# Patient Record
Sex: Female | Born: 1940 | Race: White | Hispanic: No | Smoking: Former smoker
Health system: Southern US, Community
[De-identification: ages and names within clinical notes are randomized; demographics above are authoritative.]

## PROBLEM LIST (undated history)

## (undated) DIAGNOSIS — I10 Essential (primary) hypertension: Secondary | ICD-10-CM

## (undated) DIAGNOSIS — G4700 Insomnia, unspecified: Secondary | ICD-10-CM

## (undated) DIAGNOSIS — C801 Malignant (primary) neoplasm, unspecified: Secondary | ICD-10-CM

## (undated) DIAGNOSIS — E785 Hyperlipidemia, unspecified: Secondary | ICD-10-CM

## (undated) DIAGNOSIS — E119 Type 2 diabetes mellitus without complications: Secondary | ICD-10-CM

## (undated) DIAGNOSIS — Z8542 Personal history of malignant neoplasm of other parts of uterus: Secondary | ICD-10-CM

## (undated) DIAGNOSIS — Z803 Family history of malignant neoplasm of breast: Secondary | ICD-10-CM

## (undated) HISTORY — PX: NASAL SEPTOPLASTY W/ TURBINOPLASTY: SHX2070

## (undated) HISTORY — DX: Family history of malignant neoplasm of breast: Z80.3

## (undated) HISTORY — PX: CARPAL TUNNEL RELEASE: SHX101

## (undated) HISTORY — PX: ABDOMINAL HYSTERECTOMY: SHX81

## (undated) HISTORY — PX: TONSILLECTOMY: SUR1361

## (undated) HISTORY — PX: OTHER SURGICAL HISTORY: SHX169

## (undated) HISTORY — DX: Malignant (primary) neoplasm, unspecified: C80.1

## (undated) HISTORY — DX: Hyperlipidemia, unspecified: E78.5

## (undated) HISTORY — PX: TOTAL ABDOMINAL HYSTERECTOMY W/ BILATERAL SALPINGOOPHORECTOMY: SHX83

## (undated) HISTORY — DX: Personal history of malignant neoplasm of other parts of uterus: Z85.42

## (undated) HISTORY — DX: Insomnia, unspecified: G47.00

---

## 1979-02-28 DIAGNOSIS — C801 Malignant (primary) neoplasm, unspecified: Secondary | ICD-10-CM

## 1979-02-28 HISTORY — DX: Malignant (primary) neoplasm, unspecified: C80.1

## 1999-01-24 ENCOUNTER — Other Ambulatory Visit: Admission: RE | Admit: 1999-01-24 | Discharge: 1999-01-24 | Payer: Self-pay | Admitting: Gynecology

## 2000-01-23 ENCOUNTER — Other Ambulatory Visit: Admission: RE | Admit: 2000-01-23 | Discharge: 2000-01-23 | Payer: Self-pay | Admitting: Gynecology

## 2000-06-13 ENCOUNTER — Encounter: Admission: RE | Admit: 2000-06-13 | Discharge: 2000-06-13 | Payer: Self-pay | Admitting: Gastroenterology

## 2000-06-13 ENCOUNTER — Encounter: Payer: Self-pay | Admitting: Gastroenterology

## 2000-07-09 ENCOUNTER — Ambulatory Visit (HOSPITAL_COMMUNITY): Admission: RE | Admit: 2000-07-09 | Discharge: 2000-07-09 | Payer: Self-pay | Admitting: Gastroenterology

## 2001-01-14 ENCOUNTER — Other Ambulatory Visit: Admission: RE | Admit: 2001-01-14 | Discharge: 2001-01-14 | Payer: Self-pay | Admitting: Gynecology

## 2001-05-20 ENCOUNTER — Encounter (INDEPENDENT_AMBULATORY_CARE_PROVIDER_SITE_OTHER): Payer: Self-pay | Admitting: Specialist

## 2001-05-20 ENCOUNTER — Ambulatory Visit (HOSPITAL_COMMUNITY): Admission: RE | Admit: 2001-05-20 | Discharge: 2001-05-20 | Payer: Self-pay | Admitting: Gastroenterology

## 2002-01-15 ENCOUNTER — Other Ambulatory Visit: Admission: RE | Admit: 2002-01-15 | Discharge: 2002-01-15 | Payer: Self-pay | Admitting: Gynecology

## 2003-01-19 ENCOUNTER — Other Ambulatory Visit: Admission: RE | Admit: 2003-01-19 | Discharge: 2003-01-19 | Payer: Self-pay | Admitting: Gynecology

## 2003-05-27 ENCOUNTER — Encounter: Admission: RE | Admit: 2003-05-27 | Discharge: 2003-05-27 | Payer: Self-pay | Admitting: Family Medicine

## 2003-06-03 ENCOUNTER — Encounter: Admission: RE | Admit: 2003-06-03 | Discharge: 2003-06-03 | Payer: Self-pay | Admitting: Family Medicine

## 2004-01-25 ENCOUNTER — Other Ambulatory Visit: Admission: RE | Admit: 2004-01-25 | Discharge: 2004-01-25 | Payer: Self-pay | Admitting: Gynecology

## 2004-03-03 ENCOUNTER — Ambulatory Visit: Payer: Self-pay | Admitting: Internal Medicine

## 2004-03-08 ENCOUNTER — Ambulatory Visit: Payer: Self-pay | Admitting: Internal Medicine

## 2004-03-15 ENCOUNTER — Ambulatory Visit: Payer: Self-pay | Admitting: Internal Medicine

## 2004-03-25 ENCOUNTER — Ambulatory Visit: Payer: Self-pay | Admitting: Internal Medicine

## 2004-04-01 ENCOUNTER — Ambulatory Visit: Payer: Self-pay | Admitting: Internal Medicine

## 2004-04-08 ENCOUNTER — Ambulatory Visit: Payer: Self-pay | Admitting: Internal Medicine

## 2004-04-15 ENCOUNTER — Ambulatory Visit: Payer: Self-pay | Admitting: Internal Medicine

## 2004-04-21 ENCOUNTER — Ambulatory Visit: Payer: Self-pay | Admitting: Internal Medicine

## 2004-04-29 ENCOUNTER — Ambulatory Visit: Payer: Self-pay | Admitting: Internal Medicine

## 2004-05-05 ENCOUNTER — Ambulatory Visit: Payer: Self-pay | Admitting: Internal Medicine

## 2004-05-13 ENCOUNTER — Ambulatory Visit: Payer: Self-pay | Admitting: Internal Medicine

## 2004-05-25 ENCOUNTER — Ambulatory Visit: Payer: Self-pay | Admitting: Internal Medicine

## 2004-05-30 ENCOUNTER — Ambulatory Visit: Payer: Self-pay | Admitting: Internal Medicine

## 2004-06-07 ENCOUNTER — Ambulatory Visit: Payer: Self-pay | Admitting: Internal Medicine

## 2004-06-14 ENCOUNTER — Ambulatory Visit: Payer: Self-pay | Admitting: Internal Medicine

## 2004-06-21 ENCOUNTER — Ambulatory Visit: Payer: Self-pay | Admitting: Internal Medicine

## 2004-07-05 ENCOUNTER — Ambulatory Visit: Payer: Self-pay | Admitting: Internal Medicine

## 2004-07-15 ENCOUNTER — Ambulatory Visit: Payer: Self-pay | Admitting: Internal Medicine

## 2004-07-19 ENCOUNTER — Ambulatory Visit: Payer: Self-pay | Admitting: Internal Medicine

## 2004-07-26 ENCOUNTER — Ambulatory Visit: Payer: Self-pay | Admitting: Internal Medicine

## 2004-08-02 ENCOUNTER — Ambulatory Visit: Payer: Self-pay | Admitting: Internal Medicine

## 2004-08-08 ENCOUNTER — Ambulatory Visit: Payer: Self-pay | Admitting: Internal Medicine

## 2004-08-18 ENCOUNTER — Ambulatory Visit: Payer: Self-pay | Admitting: Internal Medicine

## 2004-08-29 ENCOUNTER — Ambulatory Visit: Payer: Self-pay | Admitting: Internal Medicine

## 2004-09-02 ENCOUNTER — Encounter: Admission: RE | Admit: 2004-09-02 | Discharge: 2004-09-02 | Payer: Self-pay | Admitting: Emergency Medicine

## 2004-09-12 ENCOUNTER — Ambulatory Visit: Payer: Self-pay | Admitting: Internal Medicine

## 2004-09-22 ENCOUNTER — Ambulatory Visit: Payer: Self-pay | Admitting: Internal Medicine

## 2004-09-27 ENCOUNTER — Ambulatory Visit: Payer: Self-pay | Admitting: Internal Medicine

## 2004-10-11 ENCOUNTER — Ambulatory Visit: Payer: Self-pay | Admitting: Internal Medicine

## 2004-10-19 ENCOUNTER — Ambulatory Visit: Payer: Self-pay | Admitting: Internal Medicine

## 2004-10-26 ENCOUNTER — Ambulatory Visit: Payer: Self-pay | Admitting: Internal Medicine

## 2004-11-02 ENCOUNTER — Ambulatory Visit: Payer: Self-pay | Admitting: Internal Medicine

## 2004-11-08 ENCOUNTER — Ambulatory Visit: Payer: Self-pay | Admitting: Internal Medicine

## 2004-11-16 ENCOUNTER — Ambulatory Visit: Payer: Self-pay | Admitting: Internal Medicine

## 2004-11-25 ENCOUNTER — Ambulatory Visit: Payer: Self-pay | Admitting: Internal Medicine

## 2004-12-01 ENCOUNTER — Ambulatory Visit: Payer: Self-pay | Admitting: Internal Medicine

## 2004-12-02 ENCOUNTER — Ambulatory Visit: Payer: Self-pay | Admitting: Internal Medicine

## 2004-12-07 ENCOUNTER — Ambulatory Visit: Payer: Self-pay | Admitting: Internal Medicine

## 2004-12-15 ENCOUNTER — Ambulatory Visit: Payer: Self-pay | Admitting: Internal Medicine

## 2004-12-21 ENCOUNTER — Ambulatory Visit: Payer: Self-pay | Admitting: Internal Medicine

## 2004-12-27 ENCOUNTER — Ambulatory Visit: Payer: Self-pay | Admitting: Internal Medicine

## 2005-01-05 ENCOUNTER — Ambulatory Visit: Payer: Self-pay | Admitting: Internal Medicine

## 2005-01-13 ENCOUNTER — Ambulatory Visit: Payer: Self-pay | Admitting: Internal Medicine

## 2005-01-18 ENCOUNTER — Ambulatory Visit: Payer: Self-pay | Admitting: Internal Medicine

## 2005-01-23 ENCOUNTER — Ambulatory Visit: Payer: Self-pay | Admitting: Internal Medicine

## 2005-01-24 ENCOUNTER — Other Ambulatory Visit: Admission: RE | Admit: 2005-01-24 | Discharge: 2005-01-24 | Payer: Self-pay | Admitting: Gynecology

## 2005-02-01 ENCOUNTER — Ambulatory Visit: Payer: Self-pay | Admitting: Internal Medicine

## 2005-02-06 ENCOUNTER — Ambulatory Visit: Payer: Self-pay | Admitting: Internal Medicine

## 2005-02-15 ENCOUNTER — Ambulatory Visit: Payer: Self-pay | Admitting: Internal Medicine

## 2005-02-23 ENCOUNTER — Ambulatory Visit: Payer: Self-pay | Admitting: Internal Medicine

## 2005-03-01 ENCOUNTER — Ambulatory Visit: Payer: Self-pay | Admitting: Internal Medicine

## 2005-03-09 ENCOUNTER — Ambulatory Visit: Payer: Self-pay | Admitting: Internal Medicine

## 2005-03-15 ENCOUNTER — Ambulatory Visit: Payer: Self-pay | Admitting: Internal Medicine

## 2005-03-22 ENCOUNTER — Ambulatory Visit: Payer: Self-pay | Admitting: Internal Medicine

## 2005-03-23 ENCOUNTER — Ambulatory Visit: Payer: Self-pay | Admitting: Internal Medicine

## 2005-03-29 ENCOUNTER — Ambulatory Visit: Payer: Self-pay | Admitting: Internal Medicine

## 2005-04-05 ENCOUNTER — Ambulatory Visit: Payer: Self-pay | Admitting: Internal Medicine

## 2005-04-12 ENCOUNTER — Ambulatory Visit: Payer: Self-pay | Admitting: Internal Medicine

## 2005-04-18 ENCOUNTER — Ambulatory Visit: Payer: Self-pay | Admitting: Internal Medicine

## 2005-04-26 ENCOUNTER — Ambulatory Visit: Payer: Self-pay | Admitting: Internal Medicine

## 2005-05-02 ENCOUNTER — Ambulatory Visit: Payer: Self-pay | Admitting: Internal Medicine

## 2005-05-11 ENCOUNTER — Ambulatory Visit: Payer: Self-pay | Admitting: Internal Medicine

## 2005-05-16 ENCOUNTER — Ambulatory Visit: Payer: Self-pay | Admitting: Internal Medicine

## 2005-05-25 ENCOUNTER — Ambulatory Visit: Payer: Self-pay | Admitting: Internal Medicine

## 2005-05-30 ENCOUNTER — Ambulatory Visit: Payer: Self-pay | Admitting: Internal Medicine

## 2005-06-09 ENCOUNTER — Ambulatory Visit: Payer: Self-pay | Admitting: Internal Medicine

## 2005-06-13 ENCOUNTER — Ambulatory Visit: Payer: Self-pay | Admitting: Internal Medicine

## 2005-06-21 ENCOUNTER — Ambulatory Visit: Payer: Self-pay | Admitting: Internal Medicine

## 2005-06-27 ENCOUNTER — Ambulatory Visit: Payer: Self-pay | Admitting: Internal Medicine

## 2005-07-04 ENCOUNTER — Ambulatory Visit: Payer: Self-pay | Admitting: Internal Medicine

## 2005-07-05 ENCOUNTER — Ambulatory Visit: Payer: Self-pay | Admitting: Internal Medicine

## 2005-07-14 ENCOUNTER — Ambulatory Visit: Payer: Self-pay | Admitting: Internal Medicine

## 2005-07-19 ENCOUNTER — Ambulatory Visit: Payer: Self-pay | Admitting: Internal Medicine

## 2005-07-26 ENCOUNTER — Ambulatory Visit: Payer: Self-pay | Admitting: Internal Medicine

## 2005-08-02 ENCOUNTER — Ambulatory Visit: Payer: Self-pay | Admitting: Internal Medicine

## 2005-08-08 ENCOUNTER — Ambulatory Visit: Payer: Self-pay | Admitting: Internal Medicine

## 2005-08-16 ENCOUNTER — Ambulatory Visit: Payer: Self-pay | Admitting: Internal Medicine

## 2005-08-23 ENCOUNTER — Ambulatory Visit: Payer: Self-pay | Admitting: Internal Medicine

## 2005-08-29 ENCOUNTER — Ambulatory Visit: Payer: Self-pay | Admitting: Internal Medicine

## 2005-09-07 ENCOUNTER — Ambulatory Visit: Payer: Self-pay | Admitting: Internal Medicine

## 2005-09-15 ENCOUNTER — Ambulatory Visit: Payer: Self-pay | Admitting: Internal Medicine

## 2005-09-20 ENCOUNTER — Ambulatory Visit: Payer: Self-pay | Admitting: Internal Medicine

## 2005-09-27 ENCOUNTER — Ambulatory Visit: Payer: Self-pay | Admitting: Internal Medicine

## 2005-10-03 ENCOUNTER — Ambulatory Visit: Payer: Self-pay | Admitting: Internal Medicine

## 2005-10-09 ENCOUNTER — Ambulatory Visit: Payer: Self-pay | Admitting: Internal Medicine

## 2005-10-10 ENCOUNTER — Ambulatory Visit (HOSPITAL_BASED_OUTPATIENT_CLINIC_OR_DEPARTMENT_OTHER): Admission: RE | Admit: 2005-10-10 | Discharge: 2005-10-10 | Payer: Self-pay | Admitting: Orthopedic Surgery

## 2005-10-17 ENCOUNTER — Ambulatory Visit: Payer: Self-pay | Admitting: Internal Medicine

## 2005-10-25 ENCOUNTER — Ambulatory Visit: Payer: Self-pay | Admitting: Internal Medicine

## 2005-10-26 ENCOUNTER — Ambulatory Visit: Payer: Self-pay | Admitting: Internal Medicine

## 2005-10-31 ENCOUNTER — Ambulatory Visit: Payer: Self-pay | Admitting: Internal Medicine

## 2005-11-08 ENCOUNTER — Ambulatory Visit: Payer: Self-pay | Admitting: Internal Medicine

## 2005-11-20 ENCOUNTER — Ambulatory Visit: Payer: Self-pay | Admitting: Internal Medicine

## 2005-11-27 ENCOUNTER — Ambulatory Visit: Payer: Self-pay | Admitting: Internal Medicine

## 2005-12-07 ENCOUNTER — Ambulatory Visit: Payer: Self-pay | Admitting: Internal Medicine

## 2005-12-15 ENCOUNTER — Ambulatory Visit: Payer: Self-pay | Admitting: Internal Medicine

## 2005-12-20 ENCOUNTER — Ambulatory Visit: Payer: Self-pay | Admitting: Internal Medicine

## 2005-12-27 ENCOUNTER — Ambulatory Visit: Payer: Self-pay | Admitting: Internal Medicine

## 2006-01-03 ENCOUNTER — Ambulatory Visit: Payer: Self-pay | Admitting: Internal Medicine

## 2006-01-09 ENCOUNTER — Ambulatory Visit: Payer: Self-pay | Admitting: Internal Medicine

## 2006-01-16 ENCOUNTER — Ambulatory Visit: Payer: Self-pay | Admitting: Internal Medicine

## 2006-01-25 ENCOUNTER — Ambulatory Visit: Payer: Self-pay | Admitting: Internal Medicine

## 2006-01-29 ENCOUNTER — Other Ambulatory Visit: Admission: RE | Admit: 2006-01-29 | Discharge: 2006-01-29 | Payer: Self-pay | Admitting: Gynecology

## 2006-01-31 ENCOUNTER — Ambulatory Visit: Payer: Self-pay | Admitting: Internal Medicine

## 2006-02-06 ENCOUNTER — Ambulatory Visit: Payer: Self-pay | Admitting: Internal Medicine

## 2006-02-13 ENCOUNTER — Ambulatory Visit: Payer: Self-pay | Admitting: Internal Medicine

## 2006-02-22 ENCOUNTER — Ambulatory Visit: Payer: Self-pay | Admitting: Internal Medicine

## 2006-03-02 ENCOUNTER — Ambulatory Visit: Payer: Self-pay | Admitting: Internal Medicine

## 2006-03-06 ENCOUNTER — Ambulatory Visit: Payer: Self-pay | Admitting: Internal Medicine

## 2006-03-14 ENCOUNTER — Ambulatory Visit: Payer: Self-pay | Admitting: Internal Medicine

## 2006-03-22 ENCOUNTER — Ambulatory Visit: Payer: Self-pay | Admitting: Internal Medicine

## 2006-03-29 ENCOUNTER — Ambulatory Visit: Payer: Self-pay | Admitting: Internal Medicine

## 2006-04-04 ENCOUNTER — Ambulatory Visit: Payer: Self-pay | Admitting: Internal Medicine

## 2006-04-10 ENCOUNTER — Ambulatory Visit: Payer: Self-pay | Admitting: Internal Medicine

## 2006-04-19 ENCOUNTER — Ambulatory Visit: Payer: Self-pay | Admitting: Internal Medicine

## 2006-04-24 ENCOUNTER — Ambulatory Visit: Payer: Self-pay | Admitting: Internal Medicine

## 2006-05-01 ENCOUNTER — Ambulatory Visit: Payer: Self-pay | Admitting: Internal Medicine

## 2006-05-09 ENCOUNTER — Ambulatory Visit: Payer: Self-pay | Admitting: Internal Medicine

## 2006-05-15 ENCOUNTER — Ambulatory Visit: Payer: Self-pay | Admitting: Internal Medicine

## 2006-05-24 ENCOUNTER — Ambulatory Visit: Payer: Self-pay | Admitting: Internal Medicine

## 2006-05-31 ENCOUNTER — Ambulatory Visit: Payer: Self-pay | Admitting: Internal Medicine

## 2006-06-04 ENCOUNTER — Ambulatory Visit: Payer: Self-pay | Admitting: Internal Medicine

## 2006-06-13 ENCOUNTER — Ambulatory Visit: Payer: Self-pay | Admitting: Internal Medicine

## 2006-06-25 ENCOUNTER — Ambulatory Visit: Payer: Self-pay | Admitting: Internal Medicine

## 2006-07-03 ENCOUNTER — Ambulatory Visit: Payer: Self-pay | Admitting: Internal Medicine

## 2006-07-12 ENCOUNTER — Ambulatory Visit: Payer: Self-pay | Admitting: Internal Medicine

## 2006-07-19 ENCOUNTER — Ambulatory Visit: Payer: Self-pay | Admitting: Internal Medicine

## 2006-08-02 ENCOUNTER — Ambulatory Visit: Payer: Self-pay | Admitting: Internal Medicine

## 2006-08-08 ENCOUNTER — Ambulatory Visit: Payer: Self-pay | Admitting: Internal Medicine

## 2006-08-14 ENCOUNTER — Ambulatory Visit: Payer: Self-pay | Admitting: Internal Medicine

## 2006-08-23 ENCOUNTER — Ambulatory Visit: Payer: Self-pay | Admitting: Internal Medicine

## 2006-08-28 ENCOUNTER — Ambulatory Visit: Payer: Self-pay | Admitting: Internal Medicine

## 2006-09-03 ENCOUNTER — Ambulatory Visit: Payer: Self-pay | Admitting: Internal Medicine

## 2006-09-11 ENCOUNTER — Ambulatory Visit: Payer: Self-pay | Admitting: Internal Medicine

## 2006-09-21 ENCOUNTER — Ambulatory Visit: Payer: Self-pay | Admitting: Internal Medicine

## 2006-09-26 ENCOUNTER — Ambulatory Visit: Payer: Self-pay | Admitting: Internal Medicine

## 2006-09-28 ENCOUNTER — Ambulatory Visit: Payer: Self-pay | Admitting: Internal Medicine

## 2006-10-05 ENCOUNTER — Ambulatory Visit: Payer: Self-pay | Admitting: Internal Medicine

## 2006-10-10 ENCOUNTER — Ambulatory Visit: Payer: Self-pay | Admitting: Internal Medicine

## 2006-10-22 ENCOUNTER — Ambulatory Visit: Payer: Self-pay | Admitting: Internal Medicine

## 2006-10-30 ENCOUNTER — Ambulatory Visit: Payer: Self-pay | Admitting: Internal Medicine

## 2006-11-06 ENCOUNTER — Ambulatory Visit: Payer: Self-pay | Admitting: Internal Medicine

## 2006-11-15 ENCOUNTER — Ambulatory Visit: Payer: Self-pay | Admitting: Internal Medicine

## 2006-11-22 ENCOUNTER — Ambulatory Visit: Payer: Self-pay | Admitting: Internal Medicine

## 2006-11-29 ENCOUNTER — Ambulatory Visit: Payer: Self-pay | Admitting: Internal Medicine

## 2006-12-05 ENCOUNTER — Ambulatory Visit: Payer: Self-pay | Admitting: Internal Medicine

## 2006-12-14 ENCOUNTER — Ambulatory Visit: Payer: Self-pay | Admitting: Internal Medicine

## 2006-12-19 ENCOUNTER — Ambulatory Visit: Payer: Self-pay | Admitting: Internal Medicine

## 2006-12-27 ENCOUNTER — Ambulatory Visit: Payer: Self-pay | Admitting: Internal Medicine

## 2007-01-02 ENCOUNTER — Ambulatory Visit: Payer: Self-pay | Admitting: Internal Medicine

## 2007-01-09 ENCOUNTER — Ambulatory Visit: Payer: Self-pay | Admitting: Internal Medicine

## 2007-01-16 ENCOUNTER — Ambulatory Visit: Payer: Self-pay | Admitting: Internal Medicine

## 2007-01-22 ENCOUNTER — Ambulatory Visit: Payer: Self-pay | Admitting: Internal Medicine

## 2007-01-23 ENCOUNTER — Ambulatory Visit: Payer: Self-pay | Admitting: Internal Medicine

## 2007-01-31 ENCOUNTER — Ambulatory Visit: Payer: Self-pay | Admitting: Internal Medicine

## 2007-02-05 ENCOUNTER — Other Ambulatory Visit: Admission: RE | Admit: 2007-02-05 | Discharge: 2007-02-05 | Payer: Self-pay | Admitting: Gynecology

## 2007-02-08 ENCOUNTER — Ambulatory Visit: Payer: Self-pay | Admitting: Internal Medicine

## 2007-02-14 ENCOUNTER — Ambulatory Visit: Payer: Self-pay | Admitting: Internal Medicine

## 2007-02-25 ENCOUNTER — Ambulatory Visit: Payer: Self-pay | Admitting: Internal Medicine

## 2007-03-05 ENCOUNTER — Ambulatory Visit: Payer: Self-pay | Admitting: Internal Medicine

## 2007-03-13 ENCOUNTER — Ambulatory Visit: Payer: Self-pay | Admitting: Internal Medicine

## 2007-03-13 DIAGNOSIS — J45998 Other asthma: Secondary | ICD-10-CM

## 2007-03-16 DIAGNOSIS — J309 Allergic rhinitis, unspecified: Secondary | ICD-10-CM | POA: Insufficient documentation

## 2007-03-16 DIAGNOSIS — E785 Hyperlipidemia, unspecified: Secondary | ICD-10-CM | POA: Insufficient documentation

## 2007-03-22 ENCOUNTER — Ambulatory Visit: Payer: Self-pay | Admitting: Internal Medicine

## 2007-03-27 ENCOUNTER — Ambulatory Visit: Payer: Self-pay | Admitting: Internal Medicine

## 2007-04-10 ENCOUNTER — Ambulatory Visit: Payer: Self-pay | Admitting: Internal Medicine

## 2007-04-17 ENCOUNTER — Ambulatory Visit: Payer: Self-pay | Admitting: Internal Medicine

## 2007-04-24 ENCOUNTER — Ambulatory Visit: Payer: Self-pay | Admitting: Internal Medicine

## 2007-04-30 ENCOUNTER — Ambulatory Visit: Payer: Self-pay | Admitting: Internal Medicine

## 2007-05-10 ENCOUNTER — Ambulatory Visit: Payer: Self-pay | Admitting: Internal Medicine

## 2007-05-16 ENCOUNTER — Ambulatory Visit: Payer: Self-pay | Admitting: Internal Medicine

## 2007-05-17 ENCOUNTER — Ambulatory Visit: Payer: Self-pay | Admitting: Internal Medicine

## 2007-05-23 ENCOUNTER — Ambulatory Visit: Payer: Self-pay | Admitting: Internal Medicine

## 2007-05-31 ENCOUNTER — Ambulatory Visit: Payer: Self-pay | Admitting: Internal Medicine

## 2007-06-04 ENCOUNTER — Ambulatory Visit: Payer: Self-pay | Admitting: Internal Medicine

## 2007-06-12 ENCOUNTER — Ambulatory Visit: Payer: Self-pay | Admitting: Internal Medicine

## 2007-06-20 ENCOUNTER — Ambulatory Visit: Payer: Self-pay | Admitting: Internal Medicine

## 2007-07-03 ENCOUNTER — Ambulatory Visit: Payer: Self-pay | Admitting: Internal Medicine

## 2007-07-12 ENCOUNTER — Ambulatory Visit: Payer: Self-pay | Admitting: Internal Medicine

## 2007-07-18 ENCOUNTER — Ambulatory Visit: Payer: Self-pay | Admitting: Internal Medicine

## 2007-07-23 ENCOUNTER — Ambulatory Visit: Payer: Self-pay | Admitting: Internal Medicine

## 2007-08-02 ENCOUNTER — Ambulatory Visit: Payer: Self-pay | Admitting: Internal Medicine

## 2007-08-08 ENCOUNTER — Ambulatory Visit: Payer: Self-pay | Admitting: Internal Medicine

## 2007-08-13 ENCOUNTER — Ambulatory Visit: Payer: Self-pay | Admitting: Internal Medicine

## 2007-08-22 ENCOUNTER — Ambulatory Visit: Payer: Self-pay | Admitting: Internal Medicine

## 2007-08-28 ENCOUNTER — Ambulatory Visit: Payer: Self-pay | Admitting: Internal Medicine

## 2007-09-03 ENCOUNTER — Ambulatory Visit: Payer: Self-pay | Admitting: Internal Medicine

## 2007-09-10 ENCOUNTER — Ambulatory Visit: Payer: Self-pay | Admitting: Internal Medicine

## 2007-09-11 ENCOUNTER — Encounter: Payer: Self-pay | Admitting: Internal Medicine

## 2007-09-24 ENCOUNTER — Ambulatory Visit: Payer: Self-pay | Admitting: Internal Medicine

## 2007-09-25 ENCOUNTER — Ambulatory Visit: Payer: Self-pay | Admitting: Internal Medicine

## 2007-10-01 ENCOUNTER — Ambulatory Visit: Payer: Self-pay | Admitting: Internal Medicine

## 2007-10-10 ENCOUNTER — Ambulatory Visit: Payer: Self-pay | Admitting: Internal Medicine

## 2007-10-16 ENCOUNTER — Ambulatory Visit: Payer: Self-pay | Admitting: Internal Medicine

## 2007-10-24 ENCOUNTER — Ambulatory Visit: Payer: Self-pay | Admitting: Internal Medicine

## 2007-10-29 ENCOUNTER — Ambulatory Visit: Payer: Self-pay | Admitting: Internal Medicine

## 2007-11-06 ENCOUNTER — Ambulatory Visit: Payer: Self-pay | Admitting: Internal Medicine

## 2007-11-15 ENCOUNTER — Ambulatory Visit: Payer: Self-pay | Admitting: Pulmonary Disease

## 2007-11-15 ENCOUNTER — Ambulatory Visit: Payer: Self-pay | Admitting: Internal Medicine

## 2007-11-20 ENCOUNTER — Ambulatory Visit: Payer: Self-pay | Admitting: Internal Medicine

## 2007-11-27 ENCOUNTER — Ambulatory Visit: Payer: Self-pay | Admitting: Internal Medicine

## 2007-12-03 ENCOUNTER — Ambulatory Visit: Payer: Self-pay | Admitting: Internal Medicine

## 2007-12-03 ENCOUNTER — Telehealth (INDEPENDENT_AMBULATORY_CARE_PROVIDER_SITE_OTHER): Payer: Self-pay | Admitting: *Deleted

## 2007-12-13 ENCOUNTER — Ambulatory Visit: Payer: Self-pay | Admitting: Internal Medicine

## 2007-12-18 ENCOUNTER — Ambulatory Visit: Payer: Self-pay | Admitting: Internal Medicine

## 2007-12-30 ENCOUNTER — Ambulatory Visit: Payer: Self-pay | Admitting: Internal Medicine

## 2008-01-07 ENCOUNTER — Ambulatory Visit: Payer: Self-pay | Admitting: Internal Medicine

## 2008-01-15 ENCOUNTER — Ambulatory Visit: Payer: Self-pay | Admitting: Internal Medicine

## 2008-01-17 ENCOUNTER — Ambulatory Visit: Payer: Self-pay | Admitting: Internal Medicine

## 2008-01-20 ENCOUNTER — Ambulatory Visit: Payer: Self-pay | Admitting: Internal Medicine

## 2008-01-29 ENCOUNTER — Ambulatory Visit: Payer: Self-pay | Admitting: Internal Medicine

## 2008-02-04 ENCOUNTER — Ambulatory Visit: Payer: Self-pay | Admitting: Internal Medicine

## 2008-02-12 ENCOUNTER — Ambulatory Visit: Payer: Self-pay | Admitting: Internal Medicine

## 2008-02-18 ENCOUNTER — Ambulatory Visit: Payer: Self-pay | Admitting: Internal Medicine

## 2008-02-25 ENCOUNTER — Ambulatory Visit: Payer: Self-pay | Admitting: Pulmonary Disease

## 2008-02-25 ENCOUNTER — Ambulatory Visit: Payer: Self-pay | Admitting: Internal Medicine

## 2008-03-05 ENCOUNTER — Ambulatory Visit: Payer: Self-pay | Admitting: Internal Medicine

## 2008-03-11 ENCOUNTER — Ambulatory Visit: Payer: Self-pay | Admitting: Internal Medicine

## 2008-03-18 ENCOUNTER — Ambulatory Visit: Payer: Self-pay | Admitting: Internal Medicine

## 2008-03-25 ENCOUNTER — Ambulatory Visit: Payer: Self-pay | Admitting: Internal Medicine

## 2008-04-07 ENCOUNTER — Ambulatory Visit: Payer: Self-pay | Admitting: Internal Medicine

## 2008-04-17 ENCOUNTER — Ambulatory Visit: Payer: Self-pay | Admitting: Internal Medicine

## 2008-04-21 ENCOUNTER — Ambulatory Visit: Payer: Self-pay | Admitting: Internal Medicine

## 2008-05-01 ENCOUNTER — Ambulatory Visit: Payer: Self-pay | Admitting: Internal Medicine

## 2008-05-08 ENCOUNTER — Ambulatory Visit: Payer: Self-pay | Admitting: Internal Medicine

## 2008-05-11 ENCOUNTER — Telehealth (INDEPENDENT_AMBULATORY_CARE_PROVIDER_SITE_OTHER): Payer: Self-pay | Admitting: *Deleted

## 2008-05-12 ENCOUNTER — Ambulatory Visit: Payer: Self-pay | Admitting: Internal Medicine

## 2008-05-14 ENCOUNTER — Ambulatory Visit: Payer: Self-pay | Admitting: Internal Medicine

## 2008-05-22 ENCOUNTER — Ambulatory Visit: Payer: Self-pay | Admitting: Internal Medicine

## 2008-05-25 ENCOUNTER — Ambulatory Visit: Payer: Self-pay | Admitting: Internal Medicine

## 2008-05-27 ENCOUNTER — Ambulatory Visit: Payer: Self-pay | Admitting: Internal Medicine

## 2008-06-11 ENCOUNTER — Ambulatory Visit: Payer: Self-pay | Admitting: Internal Medicine

## 2008-06-17 ENCOUNTER — Ambulatory Visit: Payer: Self-pay | Admitting: Internal Medicine

## 2008-06-23 ENCOUNTER — Ambulatory Visit: Payer: Self-pay | Admitting: Internal Medicine

## 2008-06-30 ENCOUNTER — Ambulatory Visit: Payer: Self-pay | Admitting: Internal Medicine

## 2008-07-07 ENCOUNTER — Ambulatory Visit: Payer: Self-pay | Admitting: Internal Medicine

## 2008-07-23 ENCOUNTER — Ambulatory Visit: Payer: Self-pay | Admitting: Internal Medicine

## 2008-07-28 ENCOUNTER — Ambulatory Visit: Payer: Self-pay | Admitting: Internal Medicine

## 2008-08-04 ENCOUNTER — Ambulatory Visit: Payer: Self-pay | Admitting: Internal Medicine

## 2008-08-12 ENCOUNTER — Ambulatory Visit: Payer: Self-pay | Admitting: Internal Medicine

## 2008-08-20 ENCOUNTER — Ambulatory Visit: Payer: Self-pay | Admitting: Internal Medicine

## 2008-08-27 ENCOUNTER — Ambulatory Visit: Payer: Self-pay | Admitting: Internal Medicine

## 2008-09-03 ENCOUNTER — Ambulatory Visit: Payer: Self-pay | Admitting: Internal Medicine

## 2008-09-10 ENCOUNTER — Ambulatory Visit: Payer: Self-pay | Admitting: Internal Medicine

## 2008-09-11 ENCOUNTER — Ambulatory Visit: Payer: Self-pay | Admitting: Internal Medicine

## 2008-09-17 ENCOUNTER — Ambulatory Visit: Payer: Self-pay | Admitting: Internal Medicine

## 2008-09-25 ENCOUNTER — Ambulatory Visit: Payer: Self-pay | Admitting: Internal Medicine

## 2008-10-01 ENCOUNTER — Ambulatory Visit: Payer: Self-pay | Admitting: Internal Medicine

## 2008-10-07 ENCOUNTER — Ambulatory Visit: Payer: Self-pay | Admitting: Internal Medicine

## 2008-10-12 ENCOUNTER — Ambulatory Visit: Payer: Self-pay | Admitting: Internal Medicine

## 2008-10-21 ENCOUNTER — Ambulatory Visit: Payer: Self-pay | Admitting: Internal Medicine

## 2008-10-29 ENCOUNTER — Ambulatory Visit: Payer: Self-pay | Admitting: Internal Medicine

## 2008-11-05 ENCOUNTER — Ambulatory Visit: Payer: Self-pay | Admitting: Internal Medicine

## 2008-11-10 ENCOUNTER — Ambulatory Visit: Payer: Self-pay | Admitting: Internal Medicine

## 2008-11-27 ENCOUNTER — Ambulatory Visit: Payer: Self-pay | Admitting: Internal Medicine

## 2008-12-01 ENCOUNTER — Ambulatory Visit: Payer: Self-pay | Admitting: Internal Medicine

## 2008-12-03 ENCOUNTER — Ambulatory Visit: Payer: Self-pay | Admitting: Internal Medicine

## 2008-12-07 ENCOUNTER — Ambulatory Visit: Payer: Self-pay | Admitting: Internal Medicine

## 2008-12-17 ENCOUNTER — Ambulatory Visit: Payer: Self-pay | Admitting: Internal Medicine

## 2008-12-22 ENCOUNTER — Ambulatory Visit: Payer: Self-pay | Admitting: Internal Medicine

## 2008-12-29 ENCOUNTER — Ambulatory Visit: Payer: Self-pay | Admitting: Internal Medicine

## 2009-01-05 ENCOUNTER — Ambulatory Visit: Payer: Self-pay | Admitting: Internal Medicine

## 2009-01-06 ENCOUNTER — Ambulatory Visit: Payer: Self-pay | Admitting: Internal Medicine

## 2009-01-12 ENCOUNTER — Ambulatory Visit: Payer: Self-pay | Admitting: Internal Medicine

## 2009-01-29 ENCOUNTER — Ambulatory Visit: Payer: Self-pay | Admitting: Internal Medicine

## 2009-02-04 ENCOUNTER — Ambulatory Visit: Payer: Self-pay | Admitting: Internal Medicine

## 2009-02-15 ENCOUNTER — Ambulatory Visit: Payer: Self-pay | Admitting: Internal Medicine

## 2009-02-23 ENCOUNTER — Ambulatory Visit: Payer: Self-pay | Admitting: Internal Medicine

## 2009-03-03 ENCOUNTER — Ambulatory Visit: Payer: Self-pay | Admitting: Internal Medicine

## 2009-03-11 ENCOUNTER — Ambulatory Visit: Payer: Self-pay | Admitting: Internal Medicine

## 2009-03-25 ENCOUNTER — Ambulatory Visit: Payer: Self-pay | Admitting: Internal Medicine

## 2009-03-31 ENCOUNTER — Ambulatory Visit: Payer: Self-pay | Admitting: Internal Medicine

## 2009-04-09 ENCOUNTER — Ambulatory Visit: Payer: Self-pay | Admitting: Internal Medicine

## 2009-04-14 ENCOUNTER — Ambulatory Visit: Payer: Self-pay | Admitting: Internal Medicine

## 2009-04-23 ENCOUNTER — Ambulatory Visit: Payer: Self-pay | Admitting: Internal Medicine

## 2009-05-03 ENCOUNTER — Ambulatory Visit: Payer: Self-pay | Admitting: Internal Medicine

## 2009-05-20 ENCOUNTER — Ambulatory Visit: Payer: Self-pay | Admitting: Internal Medicine

## 2009-06-04 ENCOUNTER — Ambulatory Visit: Payer: Self-pay | Admitting: Internal Medicine

## 2009-06-10 ENCOUNTER — Ambulatory Visit: Payer: Self-pay | Admitting: Internal Medicine

## 2009-11-26 ENCOUNTER — Ambulatory Visit: Payer: Self-pay | Admitting: Critical Care Medicine

## 2010-01-31 ENCOUNTER — Encounter: Admission: RE | Admit: 2010-01-31 | Discharge: 2010-01-31 | Payer: Self-pay | Admitting: Gynecology

## 2010-02-08 ENCOUNTER — Encounter
Admission: RE | Admit: 2010-02-08 | Discharge: 2010-02-08 | Payer: Self-pay | Source: Home / Self Care | Attending: Gynecology | Admitting: Gynecology

## 2010-03-29 NOTE — Assessment & Plan Note (Signed)
Summary: allergy testing/kcw   Vital Signs:  Patient profile:   70 year old female Height:      63 inches Weight:      165.38 pounds BMI:     29.40 O2 Sat:      91 % on Room air Pulse rate:   89 / minute BP sitting:   124 / 84  (left arm) Cuff size:   regular  Vitals Entered By: Boone Master CNA (April 14, 2009 3:40 PM)  O2 Flow:  Room air CC: allergy testing - last antihistamine 04-10-09 Is Patient Diabetic? No Comments Medications reviewed with patient Daytime contact number verified with patient. Boone Master CNA  April 14, 2009 3:42 PM    Primary Provider/Referring Provider:  Shaune Pollack  CC:  allergy testing - last antihistamine 04-10-09.  History of Present Illness:  April 06, 2009- Allergic rhinitis, cough, chronic bronchitis, Hiatal Hernia Coughs with deep breath. Returns after PFT. Strong odors bother her. Something bothers her about black paper in a photo album, but says plastic pages are ok. PFT- Normal except for reduced DLCO She has an incentive spirometer. Allergy shots helped years ago when she worked at [employer]. She wakes wheezing and sleeps on 3 pilows. We discussed her hiatal hernia on CXR- potential reflux induced wheeze. Never tried Spiriva.  April 14, 2009-  Comes today for allergy retesting as planned. Nasal congestion today off antihistamines.  Skin test- Pattern has changed. positives noted and compared with her symtoms and current vaccine.     Medications Prior to Update: 1)  Allergy Vaccine  1:10 Gh 2)  Nasonex 50 Mcg/act Susp (Mometasone Furoate) .Marland Kitchen.. 1-2 Sprays Each Nostril Daily 3)  Vivelle-Dot 0.05 Mg/24hr  Pttw (Estradiol) .... Use As Directed 4)  Lipitor 10 Mg  Tabs (Atorvastatin Calcium) .... Take 1 By Mouth Once Daily 5)  Trazodone Hcl 100 Mg  Tabs (Trazodone Hcl) .... Take 1 By Mouth Once Daily 6)  Cvs Saline Nasal Spray 0.65 % Soln (Saline) .... As Needed 7)  Synthroid 25 Mcg Tabs (Levothyroxine Sodium) .... Take 1  Tablet By Mouth Once A Day 8)  Lumigan 0.03 % Soln (Bimatoprost) .... Use As Directed 9)  Etodolac 400 Mg Tabs (Etodolac) .... Take 2 By Mouth Once Daily 10)  Glucosamine 500 Mg Caps (Glucosamine Sulfate) .... Take 1 By Mouth Once Daily  Current Medications (verified): 1)  Allergy Vaccine  1:10 Gh 2)  Nasonex 50 Mcg/act Susp (Mometasone Furoate) .Marland Kitchen.. 1-2 Sprays Each Nostril Daily 3)  Vivelle-Dot 0.05 Mg/24hr  Pttw (Estradiol) .... Use As Directed 4)  Lipitor 10 Mg  Tabs (Atorvastatin Calcium) .... Take 1 By Mouth Once Daily 5)  Trazodone Hcl 100 Mg  Tabs (Trazodone Hcl) .... Take 1 By Mouth Once Daily 6)  Cvs Saline Nasal Spray 0.65 % Soln (Saline) .... As Needed 7)  Synthroid 25 Mcg Tabs (Levothyroxine Sodium) .... Take 1 Tablet By Mouth Once A Day 8)  Lumigan 0.03 % Soln (Bimatoprost) .... Use As Directed 9)  Etodolac 400 Mg Tabs (Etodolac) .... Take 2 By Mouth Once Daily 10)  Glucosamine 500 Mg Caps (Glucosamine Sulfate) .... Take 1 By Mouth Once Daily  Allergies (verified): 1)  ! Keflex 2)  ! Relafen  Past History:  Past Surgical History: Last updated: 03/11/2008 nasal septoplasty Tonsillectomy T A H and B S O  Social History: Last updated: 05/12/2008 Patient states former smoker.  Married  Risk Factors: Smoking Status: quit (03/13/2007)  Past Medical History: Allergic  Rhinitis Skin test 04/14/09 Asthma Hyperlipidemia  Review of Systems      See HPI  The patient denies anorexia, fever, weight loss, weight gain, vision loss, decreased hearing, hoarseness, chest pain, syncope, dyspnea on exertion, peripheral edema, prolonged cough, headaches, hemoptysis, abdominal pain, and severe indigestion/heartburn.    Physical Exam  Additional Exam:  General: A/Ox3; pleasant and cooperative, NAD, SKIN: no rash, lesions NODES: no lymphadenopathy HEENT: [place]/AT, EOM- WNL, Conjuctivae- clear, PERRLA, TM-WNL, Nose- clear, dry swollen mucosa, Throat- clear, no drainage or  haorseness NECK: Supple w/ fair ROM, JVD- none, normal carotid impulses w/o bruits Thyroid- CHEST: fair airflow with quiet breathing. HEART: RRR, no m/g/r heard ABDOMEN: Soft and nl;  WJX:BJYN, nl pulses, no edema        Impression & Recommendations:  Problem # 1:  ALLERGIC RHINITIS (ICD-477.9)  We discussed options. She is going to finish current supply of vaccine, then stop. We will bring her back in the Fall. We can restart her if needed. Her updated medication list for this problem includes:    Nasonex 50 Mcg/act Susp (Mometasone furoate) .Marland Kitchen... 1-2 sprays each nostril daily    Cvs Saline Nasal Spray 0.65 % Soln (Saline) .Marland Kitchen... As needed  Problem # 2:  ASTHMA (ICD-493.90) Currently clear.  Other Orders: Est. Patient Level II (82956) Allergy Puncture Test (21308) Allergy I.D Test (65784)  Patient Instructions: 1)  Please schedule a return office visit in September unless needed earlier. 2)  Finish up your current vaccine supply, then stop allergy shots. 3)  You can use your otc sinus and allergy meds as needed. 4)  We can easily restart your vaccine if needed.

## 2010-03-29 NOTE — Assessment & Plan Note (Signed)
Summary: F/U 1 YR///KP   Primary Provider/Referring Provider:  Shaune Pollack  CC:  Yearly follow up visit-sneezing this morning; stuffy nose. ? when Allergy testing is due again.Marland Kitchen  History of Present Illness: 03/13/07- Good year, no need inhalers. Blows clear from nose.  Usually loratadine helps but not today: sinus pressure, not pain, ears, fever or sore throat. Chest clear OK allergy vaccine 1:10 GH  03/11/08- Allergic rhintis, asthma Needed a depo inj and neb in Oct for "allergy attack", but well since then. Uses Advair intermittently and never needs the  rescue inhaler. sometimes uses Nasonex. mainly c/o dryiness in winter heat without epistaxis or headaches. Sudafed as needed. Saline gel as needed. Had both flu vax.  05/12/08- Allergic rhinitis, asthma Spent weekend at [facility] tournament. 4 days ago had watery nose, moving into chest. No treatment yet. Denies sick contact. Denies fever, purulent mucus, headache, earache. Using her regular meds. Husband has been ill, which is wearing on her. Continues allergy vaccine successfully.  05/03/2009- Allergic rhinitis, asthma Had flu vax. Speck blood in nasal mucus with nasal stuffiness. We discussed allergy vaccine retesting, since she was last tested 7 years ago. She has continued allergy vaccine here at 1:10 and feels it has helped. No wheezing and she has been comfortable off Advair and her rescue inhaler.     Current Medications (verified): 1)  Allergy Vaccine  1:10 Gh 2)  Nasonex 50 Mcg/act Susp (Mometasone Furoate) .Marland Kitchen.. 1-2 Sprays Each Nostril Daily 3)  Vivelle-Dot 0.05 Mg/24hr  Pttw (Estradiol) .... Use As Directed 4)  Lipitor 10 Mg  Tabs (Atorvastatin Calcium) .... Take 1 By Mouth Once Daily 5)  Trazodone Hcl 100 Mg  Tabs (Trazodone Hcl) .... Take 1 By Mouth Once Daily 6)  Cvs Saline Nasal Spray 0.65 % Soln (Saline) .... As Needed 7)  Synthroid 25 Mcg Tabs (Levothyroxine Sodium) .... Take 1 Tablet By Mouth Once A Day 8)   Lumigan 0.03 % Soln (Bimatoprost) .... Use As Directed 9)  Etodolac 400 Mg Tabs (Etodolac) .... Take 2 By Mouth Once Daily 10)  Glucosamine 500 Mg Caps (Glucosamine Sulfate) .... Take 1 By Mouth Once Daily  Allergies (verified): 1)  ! Keflex 2)  ! Relafen  Past History:  Past Medical History: Last updated: 03/13/2007 Allergic Rhinitis Asthma Hyperlipidemia  Family History: Last updated: May 03, 2009 Mother- died CVA age 45 Father- died MI age 50 2 brothers alive  Social History: Last updated: May 03, 2009 Patient states former smoker- quit 60's Married- husband patient here with pulmonary fibrosis  Risk Factors: Smoking Status: quit (03/13/2007)  Past Surgical History: nasal septoplasty Tonsillectomy T A H and B S O Pelvic floor rebuild  Family History: Mother- died CVA age 104 Father- died MI age 66 2 brothers alive  Social History: Patient states former smoker- quit 24's Married- husband patient here with pulmonary fibrosis  Review of Systems      See HPI  The patient denies anorexia, fever, weight loss, weight gain, vision loss, decreased hearing, hoarseness, chest pain, syncope, dyspnea on exertion, peripheral edema, prolonged cough, headaches, hemoptysis, abdominal pain, and severe indigestion/heartburn.    Vital Signs:  Patient profile:   70 year old female Height:      63 inches Weight:      164.50 pounds BMI:     29.25 O2 Sat:      95 % on Room air Pulse rate:   82 / minute BP sitting:   128 / 80  (right arm) Cuff size:  regular  Vitals Entered By: Reynaldo Minium CMA (April 09, 2009 9:24 AM)  O2 Flow:  Room air  Physical Exam  Additional Exam:  General: A/Ox3; pleasant and cooperative, NAD, SKIN: no rash, lesions NODES: no lymphadenopathy HEENT: [place]/AT, EOM- WNL, Conjuctivae- clear, PERRLA, TM-WNL, Nose- clear, dry swollen mucosa, Throat- throat is red without exudate, visible drainage or stridor. Dentures NECK: Supple w/ fair ROM, JVD-  none, normal carotid impulses w/o bruits Thyroid- normal to palpation CHEST: Clear to P&A HEART: RRR, no m/g/r heard ABDOMEN: Soft and nl;  ZOX:WRUE, nl pulses, no edema       Impression & Recommendations:  Problem # 1:  ASTHMA (ICD-493.90) Great control now without meds. We can reassess later if needed.  Problem # 2:  ALLERGIC RHINITIS (ICD-477.9)  We will bring her back to update skin test assessment off antihistamines. Refill Nasonex so she  can restart that. Her updated medication list for this problem includes:    Nasonex 50 Mcg/act Susp (Mometasone furoate) .Marland Kitchen... 1-2 sprays each nostril daily    Cvs Saline Nasal Spray 0.65 % Soln (Saline) .Marland Kitchen... As needed  Medications Added to Medication List This Visit: 1)  Lumigan 0.03 % Soln (Bimatoprost) .... Use as directed 2)  Etodolac 400 Mg Tabs (Etodolac) .... Take 2 by mouth once daily 3)  Glucosamine 500 Mg Caps (Glucosamine sulfate) .... Take 1 by mouth once daily  Other Orders: Admin of Therapeutic Inj  intramuscular or subcutaneous (45409) Depo- Medrol 80mg  (J1040) Nebulizer Tx (81191) Est. Patient Level III (47829)  Patient Instructions: 1)  Return as able for allergy skin testing.- Stop all antihistamines 3 days before skin testing, including cold and allergy meds, otc sleep and cough meds. 2)  Neo neb nasal 3)  depo 80 Prescriptions: NASONEX 50 MCG/ACT SUSP (MOMETASONE FUROATE) 1-2 sprays each nostril daily  #3 x 3   Entered and Authorized by:   Waymon Budge MD   Signed by:   Waymon Budge MD on 04/09/2009   Method used:   Print then Give to Patient   RxID:   [id number]      Medication Administration  Injection # 1:    Medication: Depo- Medrol 80mg     Diagnosis: ALLERGIC RHINITIS (ICD-477.9)    Route: SQ    Site: LUOQ gluteus    Exp Date: 11/2009    Lot #: [id number]    Mfr: teva    Patient tolerated injection without complications    Given by: Reynaldo Minium CMA (April 09, 2009 5:05  PM)  Medication # 1:    Medication: EMR miscellaneous medications    Diagnosis: ALLERGIC RHINITIS (ICD-477.9)    Dose: 3drops    Route: intranasal    Exp Date: 06/2010    Lot #: [id number]    Mfr: Bayer    Comments: Neo-Synephrine    Patient tolerated medication without complications    Given by: Reynaldo Minium CMA (April 09, 2009 5:06 PM)  Orders Added: 1)  Admin of Therapeutic Inj  intramuscular or subcutaneous [96372] 2)  Depo- Medrol 80mg  [J1040] 3)  Nebulizer Tx [25366] 4)  Est. Patient Level III [44034]

## 2010-03-29 NOTE — Miscellaneous (Signed)
Summary: Skin Test/[facility]  Skin Test/[facility]   Imported By: Sherian Rein 04/23/2009 08:57:27  _____________________________________________________________________  External Attachment:    Type:   Image     Comment:   External Document

## 2010-03-29 NOTE — Miscellaneous (Signed)
Summary: Injection Record/[facility]  Injection Record/[facility]   Imported By: Sherian Rein 07/06/2009 07:40:18  _____________________________________________________________________  External Attachment:    Type:   Image     Comment:   External Document

## 2010-03-29 NOTE — Miscellaneous (Signed)
Summary: Injection record/[facility]  Injection record/[facility]   Imported By: Sherian Rein 07/20/2009 13:12:00  _____________________________________________________________________  External Attachment:    Type:   Image     Comment:   External Document

## 2010-03-29 NOTE — Assessment & Plan Note (Signed)
Summary: flu shot/ mbw  Nurse Visit   Allergies: 1)  ! Keflex 2)  ! Relafen  Orders Added: 1)  Flu Vaccine 76yrs + MEDICARE PATIENTS [Q2039] 2)  Administration Flu vaccine - MCR [G0008] Flu Vaccine Consent Questions     Do you have a history of severe allergic reactions to this vaccine? no    Any prior history of allergic reactions to egg and/or gelatin? no    Do you have a sensitivity to the preservative Thimersol? no    Do you have a past history of Guillan-Barre Syndrome? no    Do you currently have an acute febrile illness? no    Have you ever had a severe reaction to latex? no    Vaccine information given and explained to patient? yes    Are you currently pregnant? no    Lot Number:[id number]   Exp Date:08/27/2010   Site Given  Left Deltoid IMmedflu   Tammy Scott  November 26, 2009 5:13 PM  Appended Document: flu shot/ mbw     Allergies: 1)  ! Keflex 2)  ! Relafen    correct Lot number on flu shot given 11/29/09 [id number] exp: 08/27/10  Tammy Scott  November 29, 2009 5:06 PM

## 2010-03-29 NOTE — Miscellaneous (Signed)
Summary: Injection Record/[facility]  Injection Record/[facility]   Imported By: Sherian Rein 07/01/2009 14:04:00  _____________________________________________________________________  External Attachment:    Type:   Image     Comment:   External Document

## 2010-05-23 ENCOUNTER — Telehealth: Payer: Self-pay | Admitting: Internal Medicine

## 2010-05-23 MED ORDER — AZITHROMYCIN 250 MG PO TABS: 250.0000 mg | ORAL_TABLET | Freq: Every day | ORAL | Status: DC

## 2010-05-23 NOTE — Telephone Encounter (Signed)
Called and advised pt of cdy recs. Pt verbalized understanding. Pt states she will hold off on the prednisone for right now and will try the zpak to see if that helps  Carver Fila, CMA

## 2010-05-23 NOTE — Telephone Encounter (Signed)
Called and spoke with pt and c/o nasal congestion, cough w/ green phlem, feels tired, headache from feeling stuffy. Pt requesting to come in for a breathing tx and a depo shot. Pt states that is what she feels like she needs to help her. Dr. Maple Hudson please advise. Thanks  Allergies  Allergen Reactions  . Cephalexin   . Nabumetone      Carver Fila, CMA

## 2010-05-23 NOTE — Telephone Encounter (Signed)
Per CDY-we dont have any openings so please suggest Sudafed, Netipot,and Zapk #1 take as directed no refills. We can always give Prednisone 10mg  #20 take 4 x 2 days, 3 x 2 days, 2 x 2 days, 1 x 2 days, then stop; no refills if neccessary.

## 2010-05-27 ENCOUNTER — Other Ambulatory Visit: Payer: Self-pay | Admitting: Internal Medicine

## 2010-06-07 NOTE — Telephone Encounter (Signed)
Please advise if okay to give another RX as pt just had on 05-23-10. Thanks.

## 2010-06-07 NOTE — Telephone Encounter (Signed)
Ok to refill Zpak °

## 2010-06-09 MED ORDER — AZITHROMYCIN 250 MG PO TABS: ORAL_TABLET | ORAL | Status: DC

## 2010-07-15 NOTE — Op Note (Signed)
Carly [name], Carly [name]             ACCOUNT NO.:  [id number]   MEDICAL RECORD NO.:  [id number]          PATIENT TYPE:  AMB   LOCATION:  DSC                          FACILITY:  [facility]   PHYSICIAN:  Katy Fitch. Sypher, M.D. 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   OPERATION:  Release of right transverse carpal ligament.   OPERATING SURGEON:  Katy Fitch. Sypher, M.D.   ASSISTANT:  None.   ANESTHESIA:  General by LMA.   SUPERVISING ANESTHESIOLOGIST:  Janetta Hora. Gelene Mink, M.D.   INDICATIONS:  [name] is a 70 year old right-hand dominant woman  referred by Dr. Shaune Pollack for evaluation and management of right hand  numbness and discomfort.  Clinical examination suggested carpal tunnel  syndrome.  Electrodiagnostic studies confirmed significant right carpal  tunnel syndrome.   Due to a failed respond to nonoperative measures, she is brought to the  operating room at this time for release of her right transverse carpal  ligament.   PROCEDURE:  [name] was brought to the operating room and placed in  the supine position on the operating table.   Following the induction of general anesthesia by LMA technique under Dr.  Thornton Dales direct supervision, the right arm and hand were prepped with  Betadine soap solution and sterilely draped.  A pneumatic tourniquet was  applied to proximal right brachium.   Following exsanguination of the right arm with an Esmarch bandage, the  arterial tourniquet was inflated to 250 mmHg.  The procedure commenced with  a short incision in the line of the ring finger and the palm.  Subcutaneous  tissues were carefully divided revealing the palmar fascia.  This  was split  longitudinally to reveal the __________ branch of the median nerve.  This  was followed back to the transverse carpal ligament which was gently  isolated from the median nerve.  The ligament was then released along its  ulnar border extending into the distal forearm.  This widely opened the  carpal canal.  No masses or any other predicaments were noted.   Bleeding points along the margin of the released ligament and other  subcutaneous vessels were electrocauterized with bipolar forceps.   The wound was then repaired with intradermal 3-0 Prolene suture.   A compressive dressing was applied with a volar plaster splint maintaining  the wrist in 5 degrees of dorsiflexion.   Ms. Curl awakened from general anesthesia and transferred to the  recovery room with stable signs.   For aftercare, she is provided a prescription for Percocet 5 mg one by mouth  every 4-6 hours p.r.n. pain, 20 tablets without refill.  Also, she is  advised use Advil or Aleve as needed for pain.      Katy Fitch Sypher, M.D.  Electronically Signed     RVS/MEDQ  D:  10/10/2005  T:  10/10/2005  Job:  [id number]   cc:   Duncan Dull, M.D.

## 2010-07-15 NOTE — Procedures (Signed)
[facility]  Patient:    Carly [name], Carly [name]                    MRN: [id number] Proc. Date: 07/09/00 Adm. Date:  [id number] Attending:  Nelda Marseille CC:         Gretta Arab. Valentina Lucks, M.D.   Procedure Report  PROCEDURE PERFORMED:  Colonoscopy.  ENDOSCOPIST:  Petra Kuba, M.D.  INDICATIONS FOR PROCEDURE:  Patient with abnormal CAT scan, right lower quadrant pain.  Consent was signed after risks, benefits, methods, and options were thoroughly discussed in the office.  MEDICATIONS USED:  Demerol 70 mg, Versed 7 mg.  DESCRIPTION OF PROCEDURE:  Rectal inspection was pertinent for external hemorrhoids.  Digital exam was negative.  Pediatric video colonoscope was inserted and fairly easily advanced around the colon to the cecum.  This did require rolling her on her back and some abdominal pressure.  The cecum was identified by the appendiceal orifice and the ileocecal valve.  In fact, the scope was inserted a very short ways into the  terminal ileum which was normal.  Photodocumentation was obtained.  The scope was slowly withdrawn. Prep was adequate.  There was some liquid stool that required washing and suctioning.  There was an occasional transverse diverticulum and a rare early one in the sigmoid but no abnormalities were seen as we slowly withdrew back to the rectum.  Specifically, no polyps, masses or inflammation.  Once back in the rectum, the scope was retroflexed pertinent for some internal hemorrhoids. The scope was straightened, air was suctioned, scope removed.  The patient tolerated the procedure well.  There was no obvious immediate complication.  ENDOSCOPIC DIAGNOSIS: 1. Internal and external hemorrhoids. 2. Occasional left-sided and transverse diverticula. 3. Otherwise within normal limits to the end of the terminal ileum and cecum.  PLAN:  Call p.r.n.  Otherwise follow up in six weeks to recheck symptoms and decide any other  work-up and plans.  Obviously she will need a laparoscope for adhesions, possible a one time small bowel follow-through just to make sure. DD:  07/09/00 TD:  07/09/00 Job: [id number]

## 2010-07-15 NOTE — Assessment & Plan Note (Signed)
[facility]                             PULMONARY OFFICE NOTE   NAME:[name], Carly KYDD                    MRN:          [id number]  DATE:03/06/2006                            DOB:          25-Apr-1940    PULMONARY/ALLERGY FOLLOWUP:   PROBLEM:  1. Asthma.  2. Allergic rhinitis.   HISTORY:  1 year followup.  She has continued her allergy vaccine here  with no problems and feels she is doing very well.  In the past month  she has been awaking at night around 2 a.m. sneezing and with watery  rhinorrhea which quickly clears.  She takes a Sudafed then in the middle  of night and goes back to sleep.  She is using her Advair p.r.n. and  albuterol very rarely.   MEDICATION:  1. Allergy vaccine.  2. Advair 250/50, changed to 100/50 and used p.r.n.  3. Vivelle.  4. Lipitor 10 mg.  5. Trazodone 100 mg.  6. Albuterol rescue inhaler.  7. Nasonex which she uses sometimes.   DRUG INTOLERANT:  Of KEFLEX and RELAFEN.   OBJECTIVE:  Weight 161 pounds.  Blood pressure 128/80.  Pulse regular  72.  Room air saturation 97%.  There is mild bilateral turbinate edema.  Mucosa looks normal with no  mucous bridging or polyps, no postnasal drainage and no conjunctival  injection.  LUNGS:  Are clear.  HEART:  Sounds regular.   IMPRESSION:  1. Allergic rhinitis.  2. Episodes in the middle of the night just in the past month sound      like over drying from indoor heat.  3. Mild intermittent asthma.   PLAN:  1. Meds were discussed and refilled, in particular discussing the      appropriate use of maintenance medicines when needed.  2. Try saline nasal gel for her nose.  3. Use the Nasonex regularly, once each nostril at bedtime.  4. Other options were discussed briefly.  5. Schedule return in 1 year but earlier p.r.n.     Clinton D. Maple Hudson, MD, Tonny Bollman, FACP  Electronically Signed    CDY/MedQ  DD: 03/06/2006  DT: 03/07/2006  Job #: [id number]   cc:   Duncan Dull, M.D.

## 2010-07-15 NOTE — Procedures (Signed)
[facility]  Patient:    FARHA, DANO Visit Number: [id number] MRN: [id number]          Service Type: Attending:  Petra Kuba, M.D. Dictated by:   Petra Kuba, M.D. Proc. Date: 05/20/01   CC:         Gretta Arab. Valentina Lucks, M.D.  Duncan Dull, M.D.   Procedure Report  PROCEDURE:  EGD with biopsy.  INDICATION:  Patient with chronic reflux, atypical chest pain, want to rule out Barretts.  Consent was signed after risks, benefits, methods, and options thoroughly discussed multiple times in the office.  MEDICATIONS:  Demerol 50, Versed 7.5.  DESCRIPTION OF PROCEDURE:  Video endoscope was inserted by direct vision.  A quick look at the posterior pharynx was normal.  The esophagus was normal.  In the distal esophagus was a tiny hiatal hernia.  There were no signs of esophagitis or Barretts.  The scope passed easily into the stomach, advanced through a normal antrum, normal pylorus, into a normal duodenal bulb and around the C-loop to a normal second portion of the duodenum.  The scope was withdrawn back to the bulb, and a good look there ruled out ulcers in that location.  The scope was withdrawn back to the stomach and retroflexed. Angularis, cardia, fundus, lesser, and greater curve were normal on retroflexed visualization.  On straight visualization, there was a small lesser curve proximal smooth submucosal nodule which was biopsied.  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  No other abnormalities were seen on straight visualization.  The scope was slowly withdrawn.  Again, a good look at the esophagus was normal except for the tiny hiatal hernia.  The scope was removed.  The patient tolerated the procedure well.  There was no obvious immediate complication.  ENDOSCOPIC DIAGNOSES: 1. Tiny hiatal hernia. 2. Small lesser curve proximal submucosal nodule, status post biopsied,  probable lipoma. 3. Otherwise within normal limits EGD.  PLAN:  Continue pump inhibitors.  Follow up with me p.r.n. in six months.  I doubt this nodule is significant but could consider CT or even an endoscopic ultrasound in the future if any worrisome symptoms or signs present. Dictated by:   Petra Kuba, M.D. Attending:  Petra Kuba, M.D. DD:  05/20/01 TD:  05/20/01 Job: [id number]

## 2010-08-02 ENCOUNTER — Ambulatory Visit
Admission: RE | Admit: 2010-08-02 | Discharge: 2010-08-02 | Disposition: A | Discharge status: Home / Self Care | Payer: Medicare Other | Source: Ambulatory Visit | Attending: Family Medicine | Admitting: Family Medicine

## 2010-08-02 ENCOUNTER — Other Ambulatory Visit: Payer: Self-pay | Admitting: Family Medicine

## 2010-08-02 DIAGNOSIS — R079 Chest pain, unspecified: Secondary | ICD-10-CM

## 2010-12-27 ENCOUNTER — Other Ambulatory Visit: Payer: Self-pay | Admitting: Gynecology

## 2010-12-27 DIAGNOSIS — Z1231 Encounter for screening mammogram for malignant neoplasm of breast: Secondary | ICD-10-CM

## 2011-01-25 ENCOUNTER — Encounter: Payer: Self-pay | Admitting: Internal Medicine

## 2011-01-25 ENCOUNTER — Ambulatory Visit (INDEPENDENT_AMBULATORY_CARE_PROVIDER_SITE_OTHER): Payer: Medicare Other | Admitting: Internal Medicine

## 2011-01-25 VITALS — BP 132/80 | HR 99 | Ht 63.0 in | Wt 161.6 lb

## 2011-01-25 DIAGNOSIS — J45909 Unspecified asthma, uncomplicated: Secondary | ICD-10-CM

## 2011-01-25 DIAGNOSIS — J309 Allergic rhinitis, unspecified: Secondary | ICD-10-CM

## 2011-01-25 MED ORDER — METHYLPREDNISOLONE ACETATE 80 MG/ML IJ SUSP
80.0000 mg | Freq: Once | INTRAMUSCULAR | Status: AC
  Administered 2011-01-25: 80 mg via INTRAMUSCULAR

## 2011-01-25 MED ORDER — PROMETHAZINE-CODEINE 6.25-10 MG/5ML PO SYRP: 5.0000 mL | ORAL_SOLUTION | ORAL | Status: AC | PRN

## 2011-01-25 MED ORDER — DOXYCYCLINE HYCLATE 100 MG PO TABS: ORAL_TABLET | ORAL | Status: DC

## 2011-01-25 NOTE — Patient Instructions (Signed)
Script for doxycycline antibiotic in case needed--sent  Script for cough syrup--printed  Neb xop 0.63  Depo 80

## 2011-01-25 NOTE — Progress Notes (Signed)
01/25/11 44 yoF former smoker followed for allergic rhinitis, asthma. PCP Dr Coralyn Mark LOV-04/09/09 Has had flu vaccine. Husband continues to be treated at [facility] for idiopathic pulmonary fibrosis. She had done well for the past year as she continues allergy vaccine at 1:10 with no problems. Now in the past 3 weeks she has had a cold. Her primary physician gave a Z-Pak. She felt almost completely well but yesterday started a new round with dry cough, nasal congestion but no fever or sore throat. We reviewed her medications and discuss supportive therapy, fluids etc.  ROS-see HPI Constitutional:   No-   weight loss, night sweats, fevers, chills, fatigue,+ lassitude. HEENT:   No-  headaches, difficulty swallowing, tooth/dental problems, sore throat,       No-  sneezing, itching, ear ache, +nasal congestion, post nasal drip,  CV:  No-   chest pain, orthopnea, PND, swelling in lower extremities, anasarca,                                  dizziness, palpitations Resp: No-   shortness of breath with exertion or at rest.              No-   productive cough,  + non-productive cough,  No- coughing up of blood.              No-   change in color of mucus.  No- wheezing.   Skin: No-   rash or lesions. GI:  No-   heartburn, indigestion, abdominal pain, nausea, vomiting, diarrhea,                 change in bowel habits, loss of appetite GU: No-   dysuria, change in color of urine, no urgency or frequency.  No- flank pain. MS:  No-   joint pain or swelling.  No- decreased range of motion.  No- back pain. Neuro-     nothing unusual Psych:  No- change in mood or affect. No depression or anxiety.  No memory loss.  OBJ General- Alert, Oriented, Affect-appropriate, Distress- none acute Skin- rash-none, lesions- none, excoriation- none Lymphadenopathy- none Head- atraumatic            Eyes- Gross vision intact, PERRLA, conjunctivae clear secretions            Ears- Hearing, canals-normal            Nose-  Clear, no-Septal dev, mucus, polyps, erosion, perforation             Throat- Mallampati III-IV- can't visualize post pharynx , , drainage- none, tonsils- atrophic Neck- flexible , trachea midline, no stridor , thyroid nl, carotid no bruit Chest - symmetrical excursion , unlabored           Heart/CV- RRR , no murmur , no gallop  , no rub, nl s1 s2                           - JVD- none , edema- none, stasis changes- none, varices- none           Lung- deep cough, dullness-none, rub- none           Chest wall-  Abd- tender-no, distended-no, bowel sounds-present, HSM- no Br/ Gen/ Rectal- Not done, not indicated Extrem- cyanosis- none, clubbing, none, atrophy- none, strength- nl Neuro- grossly intact to observation

## 2011-01-26 NOTE — Assessment & Plan Note (Signed)
Recent exacerbation due to viral syndrome plan Xopenex nebulizer treatment, Depo-Medrol, doxycycline, Mucinex

## 2011-01-26 NOTE — Assessment & Plan Note (Signed)
Allergy vaccine has done well. Current symptoms reflect her recent viral URI with bronchitis

## 2011-02-10 ENCOUNTER — Ambulatory Visit
Admission: RE | Admit: 2011-02-10 | Discharge: 2011-02-10 | Disposition: A | Discharge status: Home / Self Care | Payer: Medicare Other | Source: Ambulatory Visit | Attending: Gynecology | Admitting: Gynecology

## 2011-02-10 DIAGNOSIS — Z1231 Encounter for screening mammogram for malignant neoplasm of breast: Secondary | ICD-10-CM

## 2011-02-16 ENCOUNTER — Other Ambulatory Visit: Payer: Self-pay | Admitting: Gynecology

## 2011-02-16 DIAGNOSIS — R928 Other abnormal and inconclusive findings on diagnostic imaging of breast: Secondary | ICD-10-CM

## 2011-02-23 ENCOUNTER — Other Ambulatory Visit: Payer: Self-pay | Admitting: Gynecology

## 2011-03-07 ENCOUNTER — Other Ambulatory Visit: Payer: Medicare Other

## 2011-03-10 ENCOUNTER — Ambulatory Visit
Admission: RE | Admit: 2011-03-10 | Discharge: 2011-03-10 | Disposition: A | Discharge status: Home / Self Care | Payer: Medicare Other | Source: Ambulatory Visit | Attending: Gynecology | Admitting: Gynecology

## 2011-03-10 ENCOUNTER — Other Ambulatory Visit: Payer: Self-pay | Admitting: Gynecology

## 2011-03-10 DIAGNOSIS — R928 Other abnormal and inconclusive findings on diagnostic imaging of breast: Secondary | ICD-10-CM

## 2011-03-10 DIAGNOSIS — N63 Unspecified lump in unspecified breast: Secondary | ICD-10-CM | POA: Diagnosis not present

## 2011-03-10 DIAGNOSIS — N6009 Solitary cyst of unspecified breast: Secondary | ICD-10-CM | POA: Diagnosis not present

## 2011-03-10 DIAGNOSIS — N6489 Other specified disorders of breast: Secondary | ICD-10-CM | POA: Diagnosis not present

## 2011-03-20 DIAGNOSIS — E7800 Pure hypercholesterolemia, unspecified: Secondary | ICD-10-CM | POA: Diagnosis not present

## 2011-03-20 DIAGNOSIS — E039 Hypothyroidism, unspecified: Secondary | ICD-10-CM | POA: Diagnosis not present

## 2011-03-20 DIAGNOSIS — M25569 Pain in unspecified knee: Secondary | ICD-10-CM | POA: Diagnosis not present

## 2011-03-20 DIAGNOSIS — Z79899 Other long term (current) drug therapy: Secondary | ICD-10-CM | POA: Diagnosis not present

## 2011-04-04 DIAGNOSIS — H4011X0 Primary open-angle glaucoma, stage unspecified: Secondary | ICD-10-CM | POA: Diagnosis not present

## 2011-05-01 DIAGNOSIS — R21 Rash and other nonspecific skin eruption: Secondary | ICD-10-CM | POA: Diagnosis not present

## 2011-06-15 DIAGNOSIS — Z1211 Encounter for screening for malignant neoplasm of colon: Secondary | ICD-10-CM | POA: Diagnosis not present

## 2011-06-15 DIAGNOSIS — K5730 Diverticulosis of large intestine without perforation or abscess without bleeding: Secondary | ICD-10-CM | POA: Diagnosis not present

## 2011-06-15 DIAGNOSIS — R159 Full incontinence of feces: Secondary | ICD-10-CM | POA: Diagnosis not present

## 2011-07-26 ENCOUNTER — Other Ambulatory Visit: Payer: Self-pay | Admitting: Physician Assistant

## 2011-07-26 DIAGNOSIS — D234 Other benign neoplasm of skin of scalp and neck: Secondary | ICD-10-CM | POA: Diagnosis not present

## 2011-07-26 DIAGNOSIS — B079 Viral wart, unspecified: Secondary | ICD-10-CM | POA: Diagnosis not present

## 2011-07-26 DIAGNOSIS — L821 Other seborrheic keratosis: Secondary | ICD-10-CM | POA: Diagnosis not present

## 2011-08-28 DIAGNOSIS — H6590 Unspecified nonsuppurative otitis media, unspecified ear: Secondary | ICD-10-CM | POA: Diagnosis not present

## 2011-08-28 DIAGNOSIS — E039 Hypothyroidism, unspecified: Secondary | ICD-10-CM | POA: Diagnosis not present

## 2011-08-28 DIAGNOSIS — R609 Edema, unspecified: Secondary | ICD-10-CM | POA: Diagnosis not present

## 2011-09-15 ENCOUNTER — Ambulatory Visit
Admission: RE | Admit: 2011-09-15 | Discharge: 2011-09-15 | Disposition: A | Discharge status: Home / Self Care | Payer: Medicare Other | Source: Ambulatory Visit | Attending: Family Medicine | Admitting: Family Medicine

## 2011-09-15 ENCOUNTER — Other Ambulatory Visit: Payer: Self-pay | Admitting: Family Medicine

## 2011-09-15 DIAGNOSIS — L03119 Cellulitis of unspecified part of limb: Secondary | ICD-10-CM | POA: Diagnosis not present

## 2011-09-15 DIAGNOSIS — L02519 Cutaneous abscess of unspecified hand: Secondary | ICD-10-CM | POA: Diagnosis not present

## 2011-09-15 DIAGNOSIS — M19049 Primary osteoarthritis, unspecified hand: Secondary | ICD-10-CM | POA: Diagnosis not present

## 2011-09-15 DIAGNOSIS — M7989 Other specified soft tissue disorders: Secondary | ICD-10-CM | POA: Diagnosis not present

## 2011-09-15 DIAGNOSIS — Z23 Encounter for immunization: Secondary | ICD-10-CM | POA: Diagnosis not present

## 2011-09-17 ENCOUNTER — Inpatient Hospital Stay (HOSPITAL_COMMUNITY): Payer: Medicare Other | Admitting: Certified Registered Nurse Anesthetist

## 2011-09-17 ENCOUNTER — Encounter (HOSPITAL_COMMUNITY)
Admission: EM | Disposition: A | Discharge status: Home / Self Care | Payer: Self-pay | Source: Home / Self Care | Attending: Internal Medicine

## 2011-09-17 ENCOUNTER — Inpatient Hospital Stay (HOSPITAL_COMMUNITY): Payer: Medicare Other

## 2011-09-17 ENCOUNTER — Encounter (HOSPITAL_COMMUNITY): Payer: Self-pay | Admitting: Certified Registered Nurse Anesthetist

## 2011-09-17 ENCOUNTER — Inpatient Hospital Stay (HOSPITAL_COMMUNITY)
Admission: EM | Admit: 2011-09-17 | Discharge: 2011-09-20 | DRG: 513 | Disposition: A | Discharge status: Home / Self Care | Payer: Medicare Other | Attending: Orthopedic Surgery | Admitting: Orthopedic Surgery

## 2011-09-17 ENCOUNTER — Emergency Department (HOSPITAL_COMMUNITY): Payer: Medicare Other

## 2011-09-17 ENCOUNTER — Encounter (HOSPITAL_COMMUNITY): Payer: Self-pay | Admitting: Emergency Medicine

## 2011-09-17 DIAGNOSIS — J45909 Unspecified asthma, uncomplicated: Secondary | ICD-10-CM | POA: Diagnosis present

## 2011-09-17 DIAGNOSIS — Z87891 Personal history of nicotine dependence: Secondary | ICD-10-CM

## 2011-09-17 DIAGNOSIS — Z79899 Other long term (current) drug therapy: Secondary | ICD-10-CM

## 2011-09-17 DIAGNOSIS — S61409A Unspecified open wound of unspecified hand, initial encounter: Secondary | ICD-10-CM | POA: Diagnosis present

## 2011-09-17 DIAGNOSIS — L02519 Cutaneous abscess of unspecified hand: Secondary | ICD-10-CM | POA: Diagnosis present

## 2011-09-17 DIAGNOSIS — M129 Arthropathy, unspecified: Secondary | ICD-10-CM | POA: Diagnosis present

## 2011-09-17 DIAGNOSIS — W540XXA Bitten by dog, initial encounter: Secondary | ICD-10-CM | POA: Diagnosis not present

## 2011-09-17 DIAGNOSIS — M65839 Other synovitis and tenosynovitis, unspecified forearm: Secondary | ICD-10-CM | POA: Diagnosis not present

## 2011-09-17 DIAGNOSIS — E785 Hyperlipidemia, unspecified: Secondary | ICD-10-CM | POA: Diagnosis not present

## 2011-09-17 DIAGNOSIS — M7989 Other specified soft tissue disorders: Secondary | ICD-10-CM | POA: Diagnosis not present

## 2011-09-17 DIAGNOSIS — A280 Pasteurellosis: Secondary | ICD-10-CM | POA: Diagnosis present

## 2011-09-17 DIAGNOSIS — T148XXA Other injury of unspecified body region, initial encounter: Secondary | ICD-10-CM

## 2011-09-17 DIAGNOSIS — E039 Hypothyroidism, unspecified: Secondary | ICD-10-CM | POA: Diagnosis not present

## 2011-09-17 DIAGNOSIS — M65849 Other synovitis and tenosynovitis, unspecified hand: Secondary | ICD-10-CM | POA: Diagnosis not present

## 2011-09-17 DIAGNOSIS — L03119 Cellulitis of unspecified part of limb: Secondary | ICD-10-CM

## 2011-09-17 HISTORY — PX: I & D EXTREMITY: SHX5045

## 2011-09-17 LAB — CBC WITH DIFFERENTIAL/PLATELET
Basophils Relative: 0 % (ref 0–1)
Eosinophils Absolute: 0.1 10*3/uL (ref 0.0–0.7)
Eosinophils Relative: 1 % (ref 0–5)
HCT: 41.5 % (ref 36.0–46.0)
Hemoglobin: 14.7 g/dL (ref 12.0–15.0)
Lymphs Abs: 1 10*3/uL (ref 0.7–4.0)
MCH: 32.5 pg (ref 26.0–34.0)
MCHC: 35.4 g/dL (ref 30.0–36.0)
MCV: 91.6 fL (ref 78.0–100.0)
Monocytes Absolute: 0.8 10*3/uL (ref 0.1–1.0)
Monocytes Relative: 9 % (ref 3–12)
Neutrophils Relative %: 79 % — ABNORMAL HIGH (ref 43–77)

## 2011-09-17 LAB — POCT I-STAT, CHEM 8
Calcium, Ion: 1.28 mmol/L (ref 1.13–1.30)
Creatinine, Ser: 1 mg/dL (ref 0.50–1.10)
Glucose, Bld: 111 mg/dL — ABNORMAL HIGH (ref 70–99)
HCT: 41 % (ref 36.0–46.0)
Hemoglobin: 13.9 g/dL (ref 12.0–15.0)
TCO2: 25 mmol/L (ref 0–100)

## 2011-09-17 SURGERY — IRRIGATION AND DEBRIDEMENT EXTREMITY
Anesthesia: General | Site: Hand | Laterality: Left | Wound class: Dirty or Infected

## 2011-09-17 MED ORDER — VANCOMYCIN HCL IN DEXTROSE 1-5 GM/200ML-% IV SOLN
1000.0000 mg | Freq: Once | INTRAVENOUS | Status: AC
  Administered 2011-09-17: 1000 mg via INTRAVENOUS
  Filled 2011-09-17: qty 200

## 2011-09-17 MED ORDER — FENTANYL CITRATE 0.05 MG/ML IJ SOLN
INTRAMUSCULAR | Status: DC | PRN
  Administered 2011-09-17: 50 ug via INTRAVENOUS
  Administered 2011-09-17: 100 ug via INTRAVENOUS
  Administered 2011-09-17 (×2): 50 ug via INTRAVENOUS

## 2011-09-17 MED ORDER — ONDANSETRON HCL 4 MG/2ML IJ SOLN: 4.0000 mg | Freq: Once | INTRAMUSCULAR | Status: AC | PRN

## 2011-09-17 MED ORDER — BACITRACIN-NEOMYCIN-POLYMYXIN 400-5-5000 EX OINT
TOPICAL_OINTMENT | CUTANEOUS | Status: DC | PRN
  Administered 2011-09-17: 1 via TOPICAL

## 2011-09-17 MED ORDER — BACITRACIN-NEOMYCIN-POLYMYXIN 400-5-5000 EX OINT
TOPICAL_OINTMENT | CUTANEOUS | Status: AC
  Filled 2011-09-17: qty 1

## 2011-09-17 MED ORDER — FENTANYL CITRATE 0.05 MG/ML IJ SOLN
25.0000 ug | Freq: Once | INTRAMUSCULAR | Status: AC
  Administered 2011-09-17: 25 ug via INTRAVENOUS
  Filled 2011-09-17: qty 2

## 2011-09-17 MED ORDER — SUCCINYLCHOLINE CHLORIDE 20 MG/ML IJ SOLN
INTRAMUSCULAR | Status: DC | PRN
  Administered 2011-09-17: 120 mg via INTRAVENOUS

## 2011-09-17 MED ORDER — ACETAMINOPHEN 10 MG/ML IV SOLN: 1000.0000 mg | Freq: Once | INTRAVENOUS | Status: AC | PRN

## 2011-09-17 MED ORDER — PROPOFOL 10 MG/ML IV BOLUS
INTRAVENOUS | Status: DC | PRN
  Administered 2011-09-17: 140 mg via INTRAVENOUS

## 2011-09-17 MED ORDER — LACTATED RINGERS IV SOLN
INTRAVENOUS | Status: DC | PRN
  Administered 2011-09-17: 14:00:00 via INTRAVENOUS

## 2011-09-17 MED ORDER — HYDROMORPHONE HCL PF 1 MG/ML IJ SOLN: 0.2500 mg | INTRAMUSCULAR | Status: DC | PRN

## 2011-09-17 MED ORDER — LIDOCAINE HCL (CARDIAC) 20 MG/ML IV SOLN
INTRAVENOUS | Status: DC | PRN
  Administered 2011-09-17: 100 mg via INTRAVENOUS

## 2011-09-17 MED ORDER — SODIUM CHLORIDE 0.9 % IR SOLN
Status: DC | PRN
  Administered 2011-09-17: 3000 mL

## 2011-09-17 MED ORDER — ONDANSETRON HCL 4 MG/2ML IJ SOLN
INTRAMUSCULAR | Status: DC | PRN
  Administered 2011-09-17: 4 mg via INTRAVENOUS

## 2011-09-17 MED ORDER — PHENYLEPHRINE HCL 10 MG/ML IJ SOLN
INTRAMUSCULAR | Status: DC | PRN
  Administered 2011-09-17: 40 ug via INTRAVENOUS
  Administered 2011-09-17: 80 ug via INTRAVENOUS

## 2011-09-17 SURGICAL SUPPLY — 45 items
BANDAGE CONFORM 2  STR LF (GAUZE/BANDAGES/DRESSINGS) IMPLANT
BANDAGE ELASTIC 3 VELCRO ST LF (GAUZE/BANDAGES/DRESSINGS) ×1 IMPLANT
BANDAGE ELASTIC 4 VELCRO ST LF (GAUZE/BANDAGES/DRESSINGS) ×2 IMPLANT
BANDAGE GAUZE ELAST BULKY 4 IN (GAUZE/BANDAGES/DRESSINGS) ×2 IMPLANT
CLOTH BEACON ORANGE TIMEOUT ST (SAFETY) ×2 IMPLANT
CORDS BIPOLAR (ELECTRODE) ×2 IMPLANT
CUFF TOURNIQUET SINGLE 18IN (TOURNIQUET CUFF) ×2 IMPLANT
CUFF TOURNIQUET SINGLE 24IN (TOURNIQUET CUFF) IMPLANT
CUFF TOURNIQUET SINGLE 34IN LL (TOURNIQUET CUFF) IMPLANT
CUFF TOURNIQUET SINGLE 44IN (TOURNIQUET CUFF) IMPLANT
DRSG ADAPTIC 3X8 NADH LF (GAUZE/BANDAGES/DRESSINGS) ×2 IMPLANT
ELECT REM PT RETURN 9FT ADLT (ELECTROSURGICAL)
ELECTRODE REM PT RTRN 9FT ADLT (ELECTROSURGICAL) IMPLANT
GAUZE XEROFORM 1X8 LF (GAUZE/BANDAGES/DRESSINGS) ×1 IMPLANT
GLOVE BIOGEL M STRL SZ7.5 (GLOVE) ×1 IMPLANT
GLOVE SS BIOGEL STRL SZ 8 (GLOVE) ×1 IMPLANT
GLOVE SUPERSENSE BIOGEL SZ 8 (GLOVE) ×1
GOWN PREVENTION PLUS XLARGE (GOWN DISPOSABLE) ×1 IMPLANT
GOWN STRL NON-REIN LRG LVL3 (GOWN DISPOSABLE) ×3 IMPLANT
GOWN STRL REIN XL XLG (GOWN DISPOSABLE) ×4 IMPLANT
HANDPIECE INTERPULSE COAX TIP (DISPOSABLE)
KIT BASIN OR (CUSTOM PROCEDURE TRAY) ×2 IMPLANT
KIT ROOM TURNOVER OR (KITS) ×2 IMPLANT
LOOP VESSEL MAXI BLUE (MISCELLANEOUS) ×1 IMPLANT
MANIFOLD NEPTUNE II (INSTRUMENTS) ×2 IMPLANT
NDL HYPO 25GX1X1/2 BEV (NEEDLE) IMPLANT
NEEDLE HYPO 25GX1X1/2 BEV (NEEDLE) IMPLANT
NS IRRIG 1000ML POUR BTL (IV SOLUTION) ×2 IMPLANT
PACK ORTHO EXTREMITY (CUSTOM PROCEDURE TRAY) ×2 IMPLANT
PAD ARMBOARD 7.5X6 YLW CONV (MISCELLANEOUS) ×4 IMPLANT
PAD CAST 4YDX4 CTTN HI CHSV (CAST SUPPLIES) ×1 IMPLANT
PADDING CAST COTTON 4X4 STRL (CAST SUPPLIES) ×2
SET HNDPC FAN SPRY TIP SCT (DISPOSABLE) IMPLANT
SET IRRIG Y TYPE TUR BLADDER L (SET/KITS/TRAYS/PACK) ×1 IMPLANT
SPLINT FIBERGLASS 3X35 (CAST SUPPLIES) ×1 IMPLANT
SPONGE GAUZE 4X4 12PLY (GAUZE/BANDAGES/DRESSINGS) ×2 IMPLANT
SPONGE LAP 18X18 X RAY DECT (DISPOSABLE) ×2 IMPLANT
SPONGE LAP 4X18 X RAY DECT (DISPOSABLE) ×2 IMPLANT
SYR CONTROL 10ML LL (SYRINGE) IMPLANT
TOWEL OR 17X24 6PK STRL BLUE (TOWEL DISPOSABLE) ×2 IMPLANT
TOWEL OR 17X26 10 PK STRL BLUE (TOWEL DISPOSABLE) ×2 IMPLANT
TUBE ANAEROBIC SPECIMEN COL (MISCELLANEOUS) ×2 IMPLANT
TUBE CONNECTING 12X1/4 (SUCTIONS) ×2 IMPLANT
WATER STERILE IRR 1000ML POUR (IV SOLUTION) ×2 IMPLANT
YANKAUER SUCT BULB TIP NO VENT (SUCTIONS) ×2 IMPLANT

## 2011-09-17 NOTE — Preoperative (Signed)
Beta Blockers   Reason not to administer Beta Blockers:Not Applicable 

## 2011-09-17 NOTE — Anesthesia Preprocedure Evaluation (Addendum)
Anesthesia Evaluation  Patient identified by MRN, date of birth, ID band Patient awake    Reviewed: Allergy & Precautions, H&P , NPO status , Patient's Chart, lab work & pertinent test results, reviewed documented beta blocker date and time   Airway Mallampati: II TM Distance: >3 FB Neck ROM: Full    Dental  (+) Upper Dentures and Lower Dentures   Pulmonary asthma ,  breath sounds clear to auscultation        Cardiovascular Rhythm:Regular Rate:Normal     Neuro/Psych    GI/Hepatic   Endo/Other  Hypothyroidism   Renal/GU      Musculoskeletal   Abdominal   Peds  Hematology   Anesthesia Other Findings   Reproductive/Obstetrics                          Anesthesia Physical Anesthesia Plan  ASA: III  Anesthesia Plan: General   Post-op Pain Management:    Induction: Intravenous  Airway Management Planned: Oral ETT  Additional Equipment:   Intra-op Plan:   Post-operative Plan: Extubation in OR  Informed Consent: I have reviewed the patients History and Physical, chart, labs and discussed the procedure including the risks, benefits and alternatives for the proposed anesthesia with the patient or authorized representative who has indicated his/her understanding and acceptance.     Plan Discussed with:   Anesthesia Plan Comments: (Dog bite L. Hand with worsening infection hypothyroididism  Plan GA with oral ETT.  Kipp Brood, MD)        Anesthesia Quick Evaluation

## 2011-09-17 NOTE — Anesthesia Postprocedure Evaluation (Signed)
  Anesthesia Post-op Note  Patient: Carly [name]  Procedure(s) Performed: Procedure(s) (LRB): IRRIGATION AND DEBRIDEMENT EXTREMITY (Left)  Patient Location: PACU  Anesthesia Type: General  Level of Consciousness: awake, alert  and oriented  Airway and Oxygen Therapy: Patient Spontanous Breathing and Patient connected to nasal cannula oxygen  Post-op Pain: mild  Post-op Assessment: Post-op Vital signs reviewed and Patient's Cardiovascular Status Stable  Post-op Vital Signs: stable  Complications: No apparent anesthesia complications

## 2011-09-17 NOTE — Transfer of Care (Signed)
Immediate Anesthesia Transfer of Care Note  Patient: Carly [name]  Procedure(s) Performed: Procedure(s) (LRB): IRRIGATION AND DEBRIDEMENT EXTREMITY (Left)  Patient Location: PACU  Anesthesia Type: General  Level of Consciousness: awake, alert  and oriented  Airway & Oxygen Therapy: Patient connected to nasal cannula oxygen  Post-op Assessment: Report given to PACU RN, Post -op Vital signs reviewed and stable and Patient moving all extremities X 4  Post vital signs: Reviewed and stable  Complications: No apparent anesthesia complications

## 2011-09-17 NOTE — Consult Note (Signed)
Reason for Consult: Dog bite left hand Referring Physician: Emergency room staff  Carly [name] is an 71 y.o. female.  HPI: Patient is a pleasant female in her 70s he was bitten by her domestic golf Wednesday which was 5 days ago. [facility] and was given antibiotics and told to come to the emergency room if it was not better by Sunday. She saw a physician covering for her regular physician which is Dr. Kevan Ny. The a patient has tried medicine but is not work she is draining mouth model and pus out of her left hand which I've expressed today. She's here today with her husband she is a delightful female she complains of mild/moderate pain. She denies neck back chest abdominal pain or other complaints beyond the left hand and upper extremity.  Past Medical History  Diagnosis Date  . Allergic rhinitis     Skin Test 04-14-2009  . Asthma   . Hyperlipemia     Past Surgical History  Procedure Date  . Nasal septoplasty w/ turbinoplasty   . Tonsillectomy   . Total abdominal hysterectomy w/ bilateral salpingoophorectomy   . Pelvic floor rebuild   . Carpal tunnel release     Family History  Problem Relation Age of Onset  . Stroke Mother   . Heart attack Father     Social History:  reports that she quit smoking about 33 years ago. Her smoking use included Cigarettes. She does not have any smokeless tobacco history on file. She reports that she does not drink alcohol or use illicit drugs.  Allergies:  Allergies  Allergen Reactions  . Cephalexin Hives and Swelling  . Nabumetone Hives and Swelling    Medications: I have reviewed the patient's current medications.  Results for orders placed during the hospital encounter of 09/17/11 (from the past 48 hour(s))  CBC WITH DIFFERENTIAL     Status: Abnormal   Collection Time   09/17/11 10:33 AM      Component Value Range Comment   WBC 9.3  4.0 - 10.5 K/uL    RBC 4.53  3.87 - 5.11 MIL/uL    Hemoglobin 14.7   12.0 - 15.0 g/dL    HCT 91.4  78.2 - 95.6 %    MCV 91.6  78.0 - 100.0 fL    MCH 32.5  26.0 - 34.0 pg    MCHC 35.4  30.0 - 36.0 g/dL    RDW 21.3  08.6 - 57.8 %    Platelets 143 (*) 150 - 400 K/uL    Neutrophils Relative 79 (*) 43 - 77 %    Neutro Abs 7.4  1.7 - 7.7 K/uL    Lymphocytes Relative 11 (*) 12 - 46 %    Lymphs Abs 1.0  0.7 - 4.0 K/uL    Monocytes Relative 9  3 - 12 %    Monocytes Absolute 0.8  0.1 - 1.0 K/uL    Eosinophils Relative 1  0 - 5 %    Eosinophils Absolute 0.1  0.0 - 0.7 K/uL    Basophils Relative 0  0 - 1 %    Basophils Absolute 0.0  0.0 - 0.1 K/uL   POCT I-STAT, CHEM 8     Status: Abnormal   Collection Time   09/17/11 10:55 AM      Component Value Range Comment   Sodium 139  135 - 145 mEq/L    Potassium 4.3  3.5 - 5.1 mEq/L    Chloride  103  96 - 112 mEq/L    BUN 11  6 - 23 mg/dL    Creatinine, Ser 1.61  0.50 - 1.10 mg/dL    Glucose, Bld 096 (*) 70 - 99 mg/dL    Calcium, Ion 0.45  4.09 - 1.30 mmol/L    TCO2 25  0 - 100 mmol/L    Hemoglobin 13.9  12.0 - 15.0 g/dL    HCT 81.1  91.4 - 78.2 %     Dg Hand Complete Left  09/17/2011  *RADIOLOGY REPORT*  Clinical Data: Dog bite, redness, swelling  LEFT HAND - COMPLETE 3+ VIEW  Comparison: 09/15/2011  Findings: Diffuse soft tissue swelling evident.  Normal alignment. No fracture.  Degenerative arthritis of the left first [facility] joint and the fingers.  No radiopaque foreign body.  IMPRESSION: Increased soft tissue swelling.  No acute osseous finding.  Wrist and finger osteoarthritis  Original Report Authenticated By: Judie Petit. Ruel Favors, M.D.   Dg Hand Complete Left  09/15/2011  *RADIOLOGY REPORT*  Clinical Data: History of dog bite, cellulitis of the hand  LEFT HAND - COMPLETE 3+ VIEW  Comparison: None.  Findings: The radiocarpal joint space appears normal and the ulnar styloid is intact.  There is diffuse soft tissue swelling of the hand and wrist.  The carpal bones are in normal position.  There is degenerative change at the  articulation of the base of the first metacarpal with the trapezium.  No bony erosion is seen and no opaque foreign body is noted.  There are degenerative changes involving the DIP joints diffusely.  IMPRESSION:  1.  No bony erosion or demineralization. 2.  Degenerative changes involve the DIP joints diffusely as well as the articulation of the base of the first metacarpal with the trapezium.  Original Report Authenticated By: Juline Patch, M.D.    Review of Systems  Constitutional: Negative.   HENT: Negative.   Eyes: Negative.   Respiratory: Negative.   Cardiovascular: Negative.   Gastrointestinal: Negative.   Genitourinary: Negative.   Skin: Negative.   Neurological: Negative.   Psychiatric/Behavioral: Negative.    Blood pressure 146/82, pulse 82, temperature 98.3 F (36.8 C), temperature source Oral, resp. rate 16, SpO2 97.00%. Physical Exam Patient has a dog bite left hand dorsal aspect with infectious tenosynovitis. She has no evidence of compartment syndrome. She has no evidence of vascular compromise. She is alert and oriented. She has normal elbow and the shoulder exam there is no evidence of compartment syndrome. She does have ascending cellulitis and lymphangitis in the arm.  Right upper extremity is normal  Lotion examination is benign.   Marland Kitchen.The patient is alert and oriented in no acute distress the patient complains of pain in the affected upper extremity. The patient is noted to have a normal HEENT exam. Lung fields show equal chest expansion and no shortness of breath abdomen exam is nontender without distention. Lower extremity examination does not show any fracture dislocation or blood clot symptoms. Pelvis is stable neck and back are stable and nontender Assessment/Plan: Marland KitchenMarland KitchenWe are planning surgery for your upper extremity. The risk and benefits of surgery include risk of bleeding infection anesthesia damage to normal structures and failure of the surgery to accomplish its  intended goals of relieving symptoms and restoring function with this in mind we'll going to proceed. I have specifically discussed with the patient the pre-and postoperative regime and the does and don'ts and risk and benefits in great detail. Risk and benefits of surgery also  include risk of dystrophy chronic nerve pain failure of the healing process to go onto completion and other inherent risks of surgery The relavent the pathophysiology of the disease/injury process, as well as the alternatives for treatment and postoperative course of action has been discussed in great detail with the patient who desires to proceed.  We will do everything in our power to help you (the patient) restore function to the upper extremity. Is a pleasure to see this patient today.  Will plan for I&D emergently of the left hand and forearm due to infected dog bite  Carly [name],Jasmond River M 09/17/2011, 1:39 PM

## 2011-09-17 NOTE — H&P (Signed)
Carly [name] is an 71 y.o. female.    PCP: Hollice Espy, MD   Chief Complaint: Pain in the left hand  HPI: This is a 71 year old, Caucasian female, with past medical history of hyperlipidemia, and arthritis, who was in her usual state of health on Wednesday night when she was petting her dog. Something startled the dog and the dog bit her left hand. She bled quite profusely. She washed her hand with cold water and applied some Epsom salts. She also applied betadine and Neosporin. Her hand continued to get the swollen. The pain got severe and, so, she decided to go to her doctor's office on Friday. An x-ray was obtained. Subsequently, she was prescribed clindamycin and Bactrim. She took this on Friday and Saturday. Last night when she took the dressing off, a scab fell off and then yellowish pus started draining. This concerned her and, so, she decided to come in to the hospital today. She had some chills. Denies any fever. She also noticed a redness in her hand. The pain was 5/10 in intensity and, currently, it's much better after she received pain medications. She has been taking ibuprofen at home for the pain. Denies any nausea, vomiting. She did receive a tetanus injection by her PCP on Friday. Her dog is current on immunization.   Home Medications: Prior to Admission medications   Medication Sig Start Date End Date Taking? Authorizing Provider  atorvastatin (LIPITOR) 10 MG tablet Take 10 mg by mouth every evening.    Yes Historical Provider, MD  bimatoprost (LUMIGAN) 0.03 % ophthalmic solution Place 1 drop into both eyes at bedtime.    Yes Historical Provider, MD  clindamycin (CLEOCIN) 150 MG capsule Take 450 mg by mouth 3 (three) times daily.   Yes Historical Provider, MD  estradiol (VIVELLE-DOT) 0.05 MG/24HR Place 1 patch onto the skin once a week.    Yes Historical Provider, MD  Glucosamine-Chondroit-Vit C-Mn (GLUCOSAMINE 1500 COMPLEX PO) Take 1 capsule by mouth daily.     Yes  Historical Provider, MD  levothyroxine (SYNTHROID, LEVOTHROID) 25 MCG tablet Take 25 mcg by mouth daily.     Yes Historical Provider, MD  mometasone (NASONEX) 50 MCG/ACT nasal spray Place 2 sprays into the nose daily.     Yes Historical Provider, MD  sulfamethoxazole-trimethoprim (BACTRIM DS) 800-160 MG per tablet Take 1 tablet by mouth every 12 (twelve) hours.   Yes Historical Provider, MD  traZODone (DESYREL) 100 MG tablet Take 100 mg by mouth at bedtime.     Yes Historical Provider, MD    Allergies:  Allergies  Allergen Reactions  . Cephalexin Hives and Swelling  . Nabumetone Hives and Swelling    Past Medical History: Past Medical History  Diagnosis Date  . Allergic rhinitis     Skin Test 04-14-2009  . Asthma   . Hyperlipemia     Past Surgical History  Procedure Date  . Nasal septoplasty w/ turbinoplasty   . Tonsillectomy   . Total abdominal hysterectomy w/ bilateral salpingoophorectomy   . Pelvic floor rebuild   . Carpal tunnel release     Social History:  reports that she quit smoking about 33 years ago. Her smoking use included Cigarettes. She does not have any smokeless tobacco history on file. She reports that she does not drink alcohol or use illicit drugs.  Family History:  Family History  Problem Relation Age of Onset  . Stroke Mother   . Heart attack Father     Review  of Systems - History obtained from the patient General ROS: positive for  - chills Psychological ROS: negative Ophthalmic ROS: negative ENT ROS: negative Allergy and Immunology ROS: negative Hematological and Lymphatic ROS: negative Endocrine ROS: negative Respiratory ROS: no cough, shortness of breath, or wheezing Cardiovascular ROS: no chest pain or dyspnea on exertion Gastrointestinal ROS: no abdominal pain, change in bowel habits, or black or bloody stools Genito-Urinary ROS: no dysuria, trouble voiding, or hematuria Musculoskeletal ROS: as in hpi Neurological ROS: no TIA or stroke  symptoms Dermatological ROS: negative  Physical Examination Blood pressure 146/82, pulse 82, temperature 98.3 F (36.8 C), temperature source Oral, resp. rate 16, SpO2 97.00%.  General appearance: alert, cooperative, appears stated age and no distress Head: Normocephalic, without obvious abnormality, atraumatic Eyes: conjunctivae/corneas clear. PERRL, EOM's intact. Fundi benign. Throat: lips, mucosa, and tongue normal; teeth and gums normal Neck: no adenopathy, no carotid bruit, no JVD, supple, symmetrical, trachea midline and thyroid not enlarged, symmetric, no tenderness/mass/nodules Resp: clear to auscultation bilaterally Cardio: regular rate and rhythm, S1, S2 normal, no murmur, click, rub or gallop GI: soft, non-tender; bowel sounds normal; no masses,  no organomegaly Extremities: The left hand was covered in a dressing. I did not remove the dressing at this time since the hand surgeon was also going to evaluate today. Rest of the extremities are normal. The hand was noted to be swollen on the left side. Pulses: 2+ and symmetric Skin: Erythema was noted on the forearm. The area was warm to touch. Lymph nodes: Cervical, supraclavicular, and axillary nodes normal. Neurologic: She is alert and oriented x3. No focal neurological deficits are noted.  Laboratory Data: Results for orders placed during the hospital encounter of 09/17/11 (from the past 48 hour(s))  CBC WITH DIFFERENTIAL     Status: Abnormal   Collection Time   09/17/11 10:33 AM      Component Value Range Comment   WBC 9.3  4.0 - 10.5 K/uL    RBC 4.53  3.87 - 5.11 MIL/uL    Hemoglobin 14.7  12.0 - 15.0 g/dL    HCT 40.9  81.1 - 91.4 %    MCV 91.6  78.0 - 100.0 fL    MCH 32.5  26.0 - 34.0 pg    MCHC 35.4  30.0 - 36.0 g/dL    RDW 78.2  95.6 - 21.3 %    Platelets 143 (*) 150 - 400 K/uL    Neutrophils Relative 79 (*) 43 - 77 %    Neutro Abs 7.4  1.7 - 7.7 K/uL    Lymphocytes Relative 11 (*) 12 - 46 %    Lymphs Abs 1.0   0.7 - 4.0 K/uL    Monocytes Relative 9  3 - 12 %    Monocytes Absolute 0.8  0.1 - 1.0 K/uL    Eosinophils Relative 1  0 - 5 %    Eosinophils Absolute 0.1  0.0 - 0.7 K/uL    Basophils Relative 0  0 - 1 %    Basophils Absolute 0.0  0.0 - 0.1 K/uL   POCT I-STAT, CHEM 8     Status: Abnormal   Collection Time   09/17/11 10:55 AM      Component Value Range Comment   Sodium 139  135 - 145 mEq/L    Potassium 4.3  3.5 - 5.1 mEq/L    Chloride 103  96 - 112 mEq/L    BUN 11  6 - 23 mg/dL    Creatinine,  Ser 1.00  0.50 - 1.10 mg/dL    Glucose, Bld 161 (*) 70 - 99 mg/dL    Calcium, Ion 0.96  0.45 - 1.30 mmol/L    TCO2 25  0 - 100 mmol/L    Hemoglobin 13.9  12.0 - 15.0 g/dL    HCT 40.9  81.1 - 91.4 %     Radiology Reports: Dg Hand Complete Left  09/17/2011  *RADIOLOGY REPORT*  Clinical Data: Dog bite, redness, swelling  LEFT HAND - COMPLETE 3+ VIEW  Comparison: 09/15/2011  Findings: Diffuse soft tissue swelling evident.  Normal alignment. No fracture.  Degenerative arthritis of the left first [facility] joint and the fingers.  No radiopaque foreign body.  IMPRESSION: Increased soft tissue swelling.  No acute osseous finding.  Wrist and finger osteoarthritis  Original Report Authenticated By: Judie Petit. Ruel Favors, M.D.   Dg Hand Complete Left  09/15/2011  *RADIOLOGY REPORT*  Clinical Data: History of dog bite, cellulitis of the hand  LEFT HAND - COMPLETE 3+ VIEW  Comparison: None.  Findings: The radiocarpal joint space appears normal and the ulnar styloid is intact.  There is diffuse soft tissue swelling of the hand and wrist.  The carpal bones are in normal position.  There is degenerative change at the articulation of the base of the first metacarpal with the trapezium.  No bony erosion is seen and no opaque foreign body is noted.  There are degenerative changes involving the DIP joints diffusely.  IMPRESSION:  1.  No bony erosion or demineralization. 2.  Degenerative changes involve the DIP joints diffusely as well  as the articulation of the base of the first metacarpal with the trapezium.  Original Report Authenticated By: Juline Patch, M.D.     Assessment/Plan  Principal Problem:  *Cellulitis and abscess of hand Active Problems:  Dog bite  Hypothyroidism   #1 cellulitis and abscess of the left hand secondary to dog bite: Hand surgeon will be evaluating this patient today. He may have to take her for incision and drainage. If so the drainage should, be sent to microbiology for cultures. We will need to cover this patient with vancomycin for gram-positive and should cover her with an antibiotic that covers Pasteurella and that will be Levaquin. However, we'll wait for cultures to be obtained first before initiating the antibiotics. Pain control will be provided. The antibiotic regimen has been discussed with the infectious disease specialist, Dr. Daiva Eves.  #2 history of hypothyroidism. Continue with Synthroid.  #3 history of hyperlipidemia: This is stable.  DVT, prophylaxis will be initiated. She is a full code.  Further management decisions will depend on results of further testing and patient's response to treatment  [facility]  [facility] Pager [phone]  09/17/2011, 12:43 PM

## 2011-09-17 NOTE — Op Note (Signed)
See full dictation Dict [phone] Findings:  Deep infection with purulent extensor tenosynovitis Dominica Severin MD

## 2011-09-17 NOTE — ED Notes (Signed)
Pt reports petting her dog when a noise startled the Dog and the Dog bit her left hand. Pt seen by PMD on Friday and started on medications. Pt reports that the medications are causing her to have diarrhea. Pt took imodium today. Pt presents with swelling and redness to left hand. Pt denies N/V. Pt reports noticing pus coming out of wounds. Pt dog is up to date on all shots with recent visit.

## 2011-09-17 NOTE — Anesthesia Procedure Notes (Signed)
Procedure Name: Intubation Date/Time: 09/17/2011 2:35 PM Performed by: Jerilee Hoh Pre-anesthesia Checklist: Patient identified, Timeout performed, Emergency Drugs available, Suction available and Patient being monitored Patient Re-evaluated:Patient Re-evaluated prior to inductionOxygen Delivery Method: Circle system utilized Preoxygenation: Pre-oxygenation with 100% oxygen Intubation Type: Rapid sequence, IV induction and Cricoid Pressure applied Laryngoscope Size: Mac and 3 Grade View: Grade I Tube type: Oral Tube size: 7.0 mm Number of attempts: 1 Airway Equipment and Method: Stylet Placement Confirmation: ETT inserted through vocal cords under direct vision,  positive ETCO2 and breath sounds checked- equal and bilateral Secured at: 21 cm Tube secured with: Tape Dental Injury: Teeth and Oropharynx as per pre-operative assessment

## 2011-09-17 NOTE — ED Provider Notes (Signed)
History     CSN: [id number]  Arrival date & time 09/17/11  0910   First MD Initiated Contact with Patient 09/17/11 1009      10:36 AM HPI Patient reports approximately 5 days ago was bitten by her dog. Reports her dog is up-to-date on vaccinations. States 2 days later she received antibiotics for possible infection. Was advised return to ED if symptoms do not improve. Reports erythema and pain her swelling and she is beginning to have purulent drainage. Denies fever, numbness, tingling, weakness. Patient is a 71 y.o. female presenting with hand pain. The history is provided by the patient.  Hand Pain This is a new problem. The current episode started in the past 7 days. The problem occurs constantly. The problem has been gradually worsening. Pertinent negatives include no fever, numbness or weakness. Exacerbated by: palpation. Treatments tried: clindamycin and bactrim. The treatment provided mild relief.    Past Medical History  Diagnosis Date  . Allergic rhinitis     Skin Test 04-14-2009  . Asthma   . Hyperlipemia     Past Surgical History  Procedure Date  . Nasal septoplasty w/ turbinoplasty   . Tonsillectomy   . Total abdominal hysterectomy w/ bilateral salpingoophorectomy   . Pelvic floor rebuild   . Carpal tunnel release     Family History  Problem Relation Age of Onset  . Stroke Mother   . Heart attack Father     History  Substance Use Topics  . Smoking status: Former Smoker    Types: Cigarettes    Quit date: 02/27/1978  . Smokeless tobacco: Not on file  . Alcohol Use: No    OB History    Grav Para Term Preterm Abortions TAB SAB Ect Mult Living                  Review of Systems  Constitutional: Negative for fever.  Skin: Positive for color change and wound.       Hand pain, swelling and redness  Neurological: Negative for weakness and numbness.  All other systems reviewed and are negative.    Allergies  Cephalexin and Nabumetone  Home  Medications   Current Outpatient Rx  Name Route Sig Dispense Refill  . ATORVASTATIN CALCIUM 10 MG PO TABS Oral Take 10 mg by mouth every evening.     Marland Kitchen BIMATOPROST 0.03 % OP SOLN Both Eyes Place 1 drop into both eyes at bedtime.     Marland Kitchen CLINDAMYCIN HCL 150 MG PO CAPS Oral Take 450 mg by mouth 3 (three) times daily.    Marland Kitchen ESTRADIOL 0.05 MG/24HR TD PTTW Transdermal Place 1 patch onto the skin once a week.     Marland Kitchen GLUCOSAMINE 1500 COMPLEX PO Oral Take 1 capsule by mouth daily.      Marland Kitchen LEVOTHYROXINE SODIUM 25 MCG PO TABS Oral Take 25 mcg by mouth daily.      . MOMETASONE FUROATE 50 MCG/ACT NA SUSP Nasal Place 2 sprays into the nose daily.      . SULFAMETHOXAZOLE-TMP DS 800-160 MG PO TABS Oral Take 1 tablet by mouth every 12 (twelve) hours.    . TRAZODONE HCL 100 MG PO TABS Oral Take 100 mg by mouth at bedtime.        BP 123/89  Pulse 95  Temp 98.3 F (36.8 C) (Oral)  SpO2 96%  Physical Exam  Vitals reviewed. Constitutional: She is oriented to person, place, and time. Vital signs are normal. She appears well-developed and well-nourished.  No distress.  HENT:  Head: Normocephalic and atraumatic.  Eyes: Pupils are equal, round, and reactive to light.  Neck: Neck supple.  Pulmonary/Chest: Effort normal.  Musculoskeletal:       Right hand: She exhibits tenderness. She exhibits normal range of motion. Normal strength noted.       Hands: Neurological: She is alert and oriented to person, place, and time.  Skin: Skin is warm and dry. No rash noted. No erythema. No pallor.  Psychiatric: She has a normal mood and affect. Her behavior is normal.    ED Course  Procedures   Results for orders placed during the hospital encounter of 09/17/11  CBC WITH DIFFERENTIAL      Component Value Range   WBC 9.3  4.0 - 10.5 K/uL   RBC 4.53  3.87 - 5.11 MIL/uL   Hemoglobin 14.7  12.0 - 15.0 g/dL   HCT 45.4  09.8 - 11.9 %   MCV 91.6  78.0 - 100.0 fL   MCH 32.5  26.0 - 34.0 pg   MCHC 35.4  30.0 - 36.0 g/dL     RDW 14.7  82.9 - 56.2 %   Platelets 143 (*) 150 - 400 K/uL   Neutrophils Relative 79 (*) 43 - 77 %   Neutro Abs 7.4  1.7 - 7.7 K/uL   Lymphocytes Relative 11 (*) 12 - 46 %   Lymphs Abs 1.0  0.7 - 4.0 K/uL   Monocytes Relative 9  3 - 12 %   Monocytes Absolute 0.8  0.1 - 1.0 K/uL   Eosinophils Relative 1  0 - 5 %   Eosinophils Absolute 0.1  0.0 - 0.7 K/uL   Basophils Relative 0  0 - 1 %   Basophils Absolute 0.0  0.0 - 0.1 K/uL  POCT I-STAT, CHEM 8      Component Value Range   Sodium 139  135 - 145 mEq/L   Potassium 4.3  3.5 - 5.1 mEq/L   Chloride 103  96 - 112 mEq/L   BUN 11  6 - 23 mg/dL   Creatinine, Ser 1.30  0.50 - 1.10 mg/dL   Glucose, Bld 865 (*) 70 - 99 mg/dL   Calcium, Ion 7.84  6.96 - 1.30 mmol/L   TCO2 25  0 - 100 mmol/L   Hemoglobin 13.9  12.0 - 15.0 g/dL   HCT 29.5  28.4 - 13.2 %   Dg Hand Complete Left  09/17/2011  *RADIOLOGY REPORT*  Clinical Data: Dog bite, redness, swelling  LEFT HAND - COMPLETE 3+ VIEW  Comparison: 09/15/2011  Findings: Diffuse soft tissue swelling evident.  Normal alignment. No fracture.  Degenerative arthritis of the left first [facility] joint and the fingers.  No radiopaque foreign body.  IMPRESSION: Increased soft tissue swelling.  No acute osseous finding.  Wrist and finger osteoarthritis  Original Report Authenticated By: Judie Petit. Ruel Favors, M.D.   Dg Hand Complete Left  09/15/2011  *RADIOLOGY REPORT*  Clinical Data: History of dog bite, cellulitis of the hand  LEFT HAND - COMPLETE 3+ VIEW  Comparison: None.  Findings: The radiocarpal joint space appears normal and the ulnar styloid is intact.  There is diffuse soft tissue swelling of the hand and wrist.  The carpal bones are in normal position.  There is degenerative change at the articulation of the base of the first metacarpal with the trapezium.  No bony erosion is seen and no opaque foreign body is noted.  There are degenerative changes involving the  DIP joints diffusely.  IMPRESSION:  1.  No bony  erosion or demineralization. 2.  Degenerative changes involve the DIP joints diffusely as well as the articulation of the base of the first metacarpal with the trapezium.  Original Report Authenticated By: Juline Patch, M.D.      MDM   12:02 PM Spoke with Dr. Amanda Pea. He'll come see the patient for evaluation of hand abscess. Will also speak with tried hospitalist for admission for failed outpatient treatment of cellulitis. Patient and family agree with plan to.  12:14 PM Spoke With [facility], Dr Rito Ehrlich, he'll come see the patient for admission .   Thomasene Lot, PA-C 09/17/11 1215

## 2011-09-18 ENCOUNTER — Inpatient Hospital Stay (HOSPITAL_COMMUNITY): Payer: Medicare Other

## 2011-09-18 ENCOUNTER — Encounter (HOSPITAL_COMMUNITY): Payer: Self-pay | Admitting: Orthopedic Surgery

## 2011-09-18 DIAGNOSIS — M65839 Other synovitis and tenosynovitis, unspecified forearm: Secondary | ICD-10-CM | POA: Diagnosis present

## 2011-09-18 DIAGNOSIS — S61409A Unspecified open wound of unspecified hand, initial encounter: Secondary | ICD-10-CM | POA: Diagnosis present

## 2011-09-18 DIAGNOSIS — J45909 Unspecified asthma, uncomplicated: Secondary | ICD-10-CM | POA: Diagnosis present

## 2011-09-18 DIAGNOSIS — Z79899 Other long term (current) drug therapy: Secondary | ICD-10-CM | POA: Diagnosis not present

## 2011-09-18 DIAGNOSIS — E039 Hypothyroidism, unspecified: Secondary | ICD-10-CM | POA: Diagnosis not present

## 2011-09-18 DIAGNOSIS — T148XXA Other injury of unspecified body region, initial encounter: Secondary | ICD-10-CM | POA: Diagnosis not present

## 2011-09-18 DIAGNOSIS — IMO0001 Reserved for inherently not codable concepts without codable children: Secondary | ICD-10-CM | POA: Diagnosis not present

## 2011-09-18 DIAGNOSIS — M129 Arthropathy, unspecified: Secondary | ICD-10-CM | POA: Diagnosis present

## 2011-09-18 DIAGNOSIS — W540XXA Bitten by dog, initial encounter: Secondary | ICD-10-CM | POA: Diagnosis not present

## 2011-09-18 DIAGNOSIS — M79609 Pain in unspecified limb: Secondary | ICD-10-CM | POA: Diagnosis not present

## 2011-09-18 DIAGNOSIS — E785 Hyperlipidemia, unspecified: Secondary | ICD-10-CM | POA: Diagnosis present

## 2011-09-18 DIAGNOSIS — Z87891 Personal history of nicotine dependence: Secondary | ICD-10-CM | POA: Diagnosis not present

## 2011-09-18 DIAGNOSIS — L02519 Cutaneous abscess of unspecified hand: Secondary | ICD-10-CM | POA: Diagnosis not present

## 2011-09-18 DIAGNOSIS — A280 Pasteurellosis: Secondary | ICD-10-CM | POA: Diagnosis present

## 2011-09-18 DIAGNOSIS — S51809A Unspecified open wound of unspecified forearm, initial encounter: Secondary | ICD-10-CM | POA: Diagnosis not present

## 2011-09-18 MED ORDER — ONDANSETRON HCL 4 MG/2ML IJ SOLN
4.0000 mg | Freq: Four times a day (QID) | INTRAMUSCULAR | Status: DC | PRN
  Administered 2011-09-19: 4 mg via INTRAVENOUS
  Filled 2011-09-18 (×2): qty 2

## 2011-09-18 MED ORDER — BIMATOPROST 0.01 % OP SOLN
1.0000 [drp] | Freq: Every day | OPHTHALMIC | Status: DC
  Administered 2011-09-18: 1 [drp] via OPHTHALMIC
  Filled 2011-09-18: qty 2.5

## 2011-09-18 MED ORDER — ACETAMINOPHEN 325 MG PO TABS
650.0000 mg | ORAL_TABLET | Freq: Four times a day (QID) | ORAL | Status: DC | PRN
  Administered 2011-09-19 (×2): 650 mg via ORAL
  Filled 2011-09-18 (×2): qty 2

## 2011-09-18 MED ORDER — OXYCODONE HCL 5 MG PO TABS: 5.0000 mg | ORAL_TABLET | ORAL | Status: DC | PRN

## 2011-09-18 MED ORDER — SODIUM CHLORIDE 0.9 % IV SOLN
3.0000 g | Freq: Four times a day (QID) | INTRAVENOUS | Status: DC
  Administered 2011-09-18: 3 g via INTRAVENOUS
  Filled 2011-09-18 (×3): qty 3

## 2011-09-18 MED ORDER — VITAMIN C 500 MG PO TABS
1000.0000 mg | ORAL_TABLET | Freq: Every day | ORAL | Status: DC
  Administered 2011-09-18 – 2011-09-20 (×3): 1000 mg via ORAL
  Filled 2011-09-18 (×3): qty 2

## 2011-09-18 MED ORDER — ONDANSETRON HCL 4 MG PO TABS: 4.0000 mg | ORAL_TABLET | Freq: Four times a day (QID) | ORAL | Status: DC | PRN

## 2011-09-18 MED ORDER — LEVOFLOXACIN IN D5W 750 MG/150ML IV SOLN
750.0000 mg | INTRAVENOUS | Status: DC
  Administered 2011-09-18 – 2011-09-20 (×3): 750 mg via INTRAVENOUS
  Filled 2011-09-18 (×6): qty 150

## 2011-09-18 MED ORDER — LEVOTHYROXINE SODIUM 25 MCG PO TABS
25.0000 ug | ORAL_TABLET | Freq: Every day | ORAL | Status: DC
  Administered 2011-09-18 – 2011-09-20 (×3): 25 ug via ORAL
  Filled 2011-09-18 (×4): qty 1

## 2011-09-18 MED ORDER — VANCOMYCIN HCL 1000 MG IV SOLR
1250.0000 mg | INTRAVENOUS | Status: DC
  Administered 2011-09-18: 1250 mg via INTRAVENOUS
  Filled 2011-09-18 (×2): qty 1250

## 2011-09-18 MED ORDER — ACETAMINOPHEN 650 MG RE SUPP: 650.0000 mg | Freq: Four times a day (QID) | RECTAL | Status: DC | PRN

## 2011-09-18 MED ORDER — ALBUTEROL SULFATE (5 MG/ML) 0.5% IN NEBU: 2.5000 mg | INHALATION_SOLUTION | RESPIRATORY_TRACT | Status: DC | PRN

## 2011-09-18 MED ORDER — FLUTICASONE PROPIONATE 50 MCG/ACT NA SUSP
1.0000 | Freq: Every day | NASAL | Status: DC
  Filled 2011-09-18: qty 16

## 2011-09-18 MED ORDER — HYDROCODONE-ACETAMINOPHEN 5-325 MG PO TABS
1.0000 | ORAL_TABLET | ORAL | Status: DC | PRN
  Administered 2011-09-17 – 2011-09-18 (×3): 2 via ORAL
  Filled 2011-09-18 (×2): qty 2

## 2011-09-18 MED ORDER — ATORVASTATIN CALCIUM 10 MG PO TABS: 10.0000 mg | ORAL_TABLET | Freq: Every evening | ORAL | Status: DC

## 2011-09-18 MED ORDER — OXYCODONE-ACETAMINOPHEN 5-325 MG PO TABS: 1.0000 | ORAL_TABLET | ORAL | Status: DC | PRN

## 2011-09-18 MED ORDER — ONDANSETRON HCL 4 MG PO TABS
4.0000 mg | ORAL_TABLET | Freq: Four times a day (QID) | ORAL | Status: DC | PRN
  Administered 2011-09-17 – 2011-09-18 (×3): 4 mg via ORAL
  Filled 2011-09-18 (×3): qty 1

## 2011-09-18 MED ORDER — MORPHINE SULFATE 2 MG/ML IJ SOLN: 2.0000 mg | INTRAMUSCULAR | Status: DC | PRN

## 2011-09-18 MED ORDER — ATORVASTATIN CALCIUM 10 MG PO TABS
10.0000 mg | ORAL_TABLET | Freq: Every day | ORAL | Status: DC
Start: 2011-09-18 — End: 2011-09-20
  Administered 2011-09-18 – 2011-09-19 (×2): 10 mg via ORAL
  Filled 2011-09-18 (×3): qty 1

## 2011-09-18 MED ORDER — FAMOTIDINE 20 MG PO TABS
20.0000 mg | ORAL_TABLET | Freq: Two times a day (BID) | ORAL | Status: DC | PRN
Start: 2011-09-18 — End: 2011-09-20

## 2011-09-18 MED ORDER — ENOXAPARIN SODIUM 40 MG/0.4ML ~~LOC~~ SOLN
40.0000 mg | SUBCUTANEOUS | Status: DC
  Administered 2011-09-18 – 2011-09-19 (×2): 40 mg via SUBCUTANEOUS
  Filled 2011-09-18 (×3): qty 0.4

## 2011-09-18 MED ORDER — ONDANSETRON HCL 4 MG/2ML IJ SOLN: 4.0000 mg | Freq: Four times a day (QID) | INTRAMUSCULAR | Status: DC | PRN

## 2011-09-18 MED ORDER — MORPHINE SULFATE 2 MG/ML IJ SOLN
1.0000 mg | INTRAMUSCULAR | Status: DC | PRN
  Administered 2011-09-18: 1 mg via INTRAVENOUS

## 2011-09-18 MED ORDER — TRAZODONE HCL 50 MG PO TABS
50.0000 mg | ORAL_TABLET | Freq: Every evening | ORAL | Status: DC | PRN
  Filled 2011-09-18: qty 1

## 2011-09-18 MED ORDER — TRAZODONE HCL 100 MG PO TABS
100.0000 mg | ORAL_TABLET | Freq: Every day | ORAL | Status: DC
  Administered 2011-09-18 – 2011-09-19 (×2): 100 mg via ORAL
  Filled 2011-09-18 (×3): qty 1

## 2011-09-18 MED ORDER — SODIUM CHLORIDE 0.9 % IV SOLN
INTRAVENOUS | Status: DC
  Administered 2011-09-18: 1000 mL via INTRAVENOUS
  Administered 2011-09-20: 04:00:00 via INTRAVENOUS

## 2011-09-18 MED ORDER — PROMETHAZINE HCL 25 MG/ML IJ SOLN
12.5000 mg | Freq: Four times a day (QID) | INTRAMUSCULAR | Status: DC | PRN
  Administered 2011-09-18: 12.5 mg via INTRAVENOUS
  Filled 2011-09-18: qty 1

## 2011-09-18 MED ORDER — PROMETHAZINE HCL 25 MG RE SUPP: 12.5000 mg | Freq: Four times a day (QID) | RECTAL | Status: DC | PRN

## 2011-09-18 NOTE — Progress Notes (Signed)
Subjective: Patient is awake, family visiting. C/O nausea earlier this am after taking pain medications. Improved at this time. She denies increased pain,fever,chills, sob.   Objective: Vital signs in last 24 hours: Temp:  [97.8 F (36.6 C)-98.2 F (36.8 C)] 98.2 F (36.8 C) (07/22 1420) Pulse Rate:  [73-92] 84  (07/22 1420) Resp:  [16-18] 18  (07/22 1420) BP: (114-146)/(60-70) 146/70 mmHg (07/22 1420) SpO2:  [95 %-96 %] 95 % (07/22 1420)  Intake/Output from previous day: 07/21 0701 - 07/22 0700 In: 600 [I.V.:600] Out: -  Intake/Output this shift:     Basename 09/17/11 1055 09/17/11 1033  HGB 13.9 14.7    Basename 09/17/11 1055 09/17/11 1033  WBC -- 9.3  RBC -- 4.53  HCT 41.0 41.5  PLT -- 143*    Basename 09/17/11 1055  NA 139  K 4.3  CL 103  CO2 --  BUN 11  CREATININE 1.00  GLUCOSE 111*  CALCIUM --   No results found for this basename: LABPT:2,INR:2 in the last 72 hours  .Patient presents for evaluation and treatment of the of their upper extremity predicament. The patient denies neck back chest or of abdominal pain. The patient notes that they have no lower extremity problems. The patient from primarily complains of the upper extremity pain noted. Evaluation of the upper extremity shows splint is clean and dry, no ascending erythema, excellent digital rom Cultures pending, no growth to date Assessment/Plan: S/P I & D left hand purulent ext tenosynovitis with associated cellulitis Cont elevation, IV ABX, close obs. Plan for dsg change and repeat bedside I&D We have discussed with the patient the issues regarding their infection to the extremity. We will continue antibiotics and await culture results. Often times it will take 3-5 days for cultures to become final. During this time we will typically have patients on intravenous antibiotics until we can find a parenteral route of antibiotic regime specific for the bacteria or organism isolated. We have discussed with  the patient the need for daily irrigation and debridement as well as therapy to the area. We have discussed with the patient the necessity of range of motion to the involved joints as discussed today. We have discussed with the patient the unpredictability of infections at times. We'll continue to work towards good pain control and restoration of function. The patient understands the need for meticulous wound care and the necessity of proper followup.  The possible complications of stiffness (loss of motion), resistant infection, possible deep bone infection, possible chronic pain issues, possible need for multiple surgeries and even amputation.  With this in mind the patient understands our goal is to eradicate the infection to quiesence. We will continue to work towards these goals.   Arsenia Goracke L 09/18/2011, 9:37 PM

## 2011-09-18 NOTE — ED Provider Notes (Signed)
Medical screening examination/treatment/procedure(s) were performed by non-physician practitioner and as supervising physician I was immediately available for consultation/collaboration.  Juliet Rude. Rubin Payor, MD 09/18/11 1346

## 2011-09-18 NOTE — Progress Notes (Signed)
ANTIBIOTIC CONSULT NOTE - INITIAL  Pharmacy Consult for vancomycin Indication: cellulitis  Allergies  Allergen Reactions  . Cephalexin Hives and Swelling  . Nabumetone Hives and Swelling   Vital Signs: Temp: 97.8 F (36.6 C) (07/22 0507) Temp src: Oral (07/22 0507) BP: 127/62 mmHg (07/22 0507) Pulse Rate: 73  (07/22 0507)  Labs:  Basename 09/17/11 1055 09/17/11 1033  WBC -- 9.3  HGB 13.9 14.7  PLT -- 143*  LABCREA -- --  CREATININE 1.00 --    Microbiology: No results found for this or any previous visit (from the past 720 hour(s)).  Medical History: Past Medical History  Diagnosis Date  . Allergic rhinitis     Skin Test 04-14-2009  . Asthma   . Hyperlipemia     Medications:  Prescriptions prior to admission  Medication Sig Dispense Refill  . atorvastatin (LIPITOR) 10 MG tablet Take 10 mg by mouth every evening.       . bimatoprost (LUMIGAN) 0.03 % ophthalmic solution Place 1 drop into both eyes at bedtime.       . clindamycin (CLEOCIN) 150 MG capsule Take 450 mg by mouth 3 (three) times daily.      Marland Kitchen estradiol (VIVELLE-DOT) 0.05 MG/24HR Place 1 patch onto the skin once a week.       . Glucosamine-Chondroit-Vit C-Mn (GLUCOSAMINE 1500 COMPLEX PO) Take 1 capsule by mouth daily.        Marland Kitchen levothyroxine (SYNTHROID, LEVOTHROID) 25 MCG tablet Take 25 mcg by mouth daily.        . mometasone (NASONEX) 50 MCG/ACT nasal spray Place 2 sprays into the nose daily.        Marland Kitchen sulfamethoxazole-trimethoprim (BACTRIM DS) 800-160 MG per tablet Take 1 tablet by mouth every 12 (twelve) hours.      . traZODone (DESYREL) 100 MG tablet Take 100 mg by mouth at bedtime.         Scheduled:    . ampicillin-sulbactam (UNASYN) IV  3 g Intravenous Q6H  . atorvastatin  10 mg Oral QHS  . bimatoprost  1 drop Both Eyes QHS  . enoxaparin (LOVENOX) injection  40 mg Subcutaneous Q24H  . fentaNYL  25 mcg Intravenous Once  . fluticasone  1 spray Each Nare Daily  . levofloxacin (LEVAQUIN) IV  750 mg  Intravenous Q24H  . levothyroxine  25 mcg Oral Q0600  . traZODone  100 mg Oral QHS  . vancomycin  1,250 mg Intravenous Q24H  . vancomycin  1,000 mg Intravenous Once  . vitamin C  1,000 mg Oral Daily  . DISCONTD: atorvastatin  10 mg Oral QPM    Assessment: 71yo female s/p dog bite which required I&D for tenosynovitis now to start IV ABX for cellulitis of hand.  Goal of Therapy:  Vancomycin trough level 10-15 mcg/ml  Plan:  Rec'd vancomycin 1000mg  IV yesterday am; will begin vancomycin 1250mg  IV Q24H and monitor CBC, Cx, levels prn.  Colleen Can PharmD BCPS 09/18/2011,5:21 AM

## 2011-09-18 NOTE — Op Note (Signed)
NAMEMESCAL, FLINCHBAUGH             ACCOUNT NO.:  [id number]  MEDICAL RECORD NO.:  [id number]  LOCATION:  [facility]                         FACILITY:  [facility]  PHYSICIAN:  Dionne Ano. Gilberte Gorley, M.D.DATE OF BIRTH:  1940/05/23  DATE OF PROCEDURE: DATE OF DISCHARGE:                              OPERATIVE REPORT   PREOPERATIVE DIAGNOSIS:  Status post dog bite to the left hand, Wednesday (5 days ago with development of purulent extensor tenosynovitis).  POSTOPERATIVE DIAGNOSIS:  Status post dog bite to the left hand, Wednesday (5 days ago with development of purulent extensor tenosynovitis).  PROCEDURE: 1. I and D (irrigation and debridement, excisional in nature) skin and     subcutaneous tissue, tendon, and muscle, left hand and wrist. 2. Extensive/radical extensor tenosynovectomy, left wrist, and hand     with multiple drains being placed.  SURGEON:  Dionne Ano. Amanda Pea, MD  ASSISTANT:  None.  COMPLICATION:  None.  ANESTHESIA:  General.  TOURNIQUET TIME:  Less than 30 minutes.  INDICATIONS FOR THE PROCEDURE:  Ms. Gavina is a 71 year old female who presents for evaluation and treatment.  She was seen at [facility] by Dr. Kevan Ny, his partner placed on antibiotics and told to come to the emergency room on Sunday, if she was not improved.  She in fact is not improved.  She presents with purulent extensor tenosynovitis about the left hand after a piercing dog bite from her Rat Terrier.  I have counseled she and her family in regards to risks and benefits of surgery and she desires to proceed the above- mentioned operative intervention.  OPERATION IN DETAIL:  The patient was seen by myself and Anesthesia, taken to the operating suite, where a smooth induction of general anesthesia, laid supine and fully padded, prepped and draped in usual sterile fashion, Betadine scrub and paint about the left upper extremity, once this done, I then performed elevation of the  arm.  Time- out was then called and pre and postop check was verified.  The patient then underwent an incision where the dog bite was located.  The skin had gently sealed over and I immediately encountered gross amounts of purulent fluid.  This was cultured for aerobic and anaerobic cultures immediately.  We will place her on Levaquin IV following cultures in hopes to identify an organism causation.  Following this proximal and distal less than 1 cm incisions were made, and the wound was thoroughly explored.  I and D of skin, subcutaneous tissue, muscle, tendon, was accomplished.  There was no bony encroachment.  Once this was done, I then performed extensor tenosynovectomy.  This was a radical extensor tenosynovectomy about the wrist and hand.  Following this, I placed 6 L of fluid through the 3 different areas/holes and then following this, placed 4 drains of vessel loop variety.  Following placement of the drains, I then irrigated additionally followed by placement of Adaptic, Xeroform gauze and volar plaster splint with compressive wrap.  The patient had excellent refill.  Tourniquet time was limited to less than 30 minutes and I did irrigate with tourniquet deflated.  She had a rather robust infection.  We are going to do everything we can to  return her to a quiescent state of affairs and she understands this.  She will be admitted for IV antibiotics.  We will plan for Levaquin given a supposed beta-lactam allergy and keep a close eye on her.  We will ask therapy to see her for range of motion and other measures. These notes have been discussed.  Should any problems arise, she will notify me.  Otherwise, look forward to seeing her back in the postop period in the hours and days ahead of course.  It was a pleasure to see her today.     Dionne Ano. Amanda Pea, M.D.     Nash Mantis  D:  09/17/2011  T:  09/17/2011  Job:  [id number]  cc:   Duncan Dull, M.D. Osvaldo Shipper, MD

## 2011-09-18 NOTE — Progress Notes (Signed)
PCP: Hollice Espy, MD  Brief HPI:  This is a 71 year old, Caucasian female, with past medical history of hyperlipidemia, and arthritis, who was in her usual state of health on Wednesday night when she was petting her dog. Something startled the dog and the dog bit her left hand. She bled quite profusely. She washed her hand with cold water and applied some Epsom salts. She also applied betadine and Neosporin. Her hand continued to get more swollen. The pain got severe and, so, she decided to go to her doctor's office on Friday. An x-ray was obtained. Subsequently, she was prescribed clindamycin and Bactrim. She took this on Friday and Saturday. The night before admission when she took the dressing off, a scab fell off and then yellowish pus started draining. This concerned her and, so, she decided to come in to the hospital. She had some chills. Denied any fever. She also noticed a redness in her hand. The pain was 5/10 in intensity and improved after she received pain medications. She had been taking ibuprofen at home for the pain. Denied any nausea, vomiting. She did receive a tetanus injection by her PCP on Friday. Her dog is current on immunization.  Past medical history:  Past Medical History  Diagnosis Date  . Allergic rhinitis     Skin Test 04-14-2009  . Asthma   . Hyperlipemia     Consultants: Dr. Amanda Pea (Hand Surgeon)  Procedures: I and D Left hand and wrist. Radical extensor tenosynovectomy on 7/21  Subjective: Patient feels well. Denies significant pain. Had some nausea which she attributes to the Oxycodone. No vomiting. Nausea is better.  Objective: Vital signs in last 24 hours: Temp:  [97.8 F (36.6 C)-98.7 F (37.1 C)] 98.2 F (36.8 C) (07/22 1420) Pulse Rate:  [73-95] 84  (07/22 1420) Resp:  [10-19] 18  (07/22 1420) BP: (114-146)/(60-76) 146/70 mmHg (07/22 1420) SpO2:  [94 %-97 %] 95 % (07/22 1420) Weight change:  Last BM Date: 09/17/11  Intake/Output from  previous day: 07/21 0701 - 07/22 0700 In: 600 [I.V.:600] Out: -  Intake/Output this shift: Total I/O In: 240 [P.O.:240] Out: -   General appearance: alert, cooperative, appears stated age and no distress Head: Normocephalic, without obvious abnormality, atraumatic Resp: clear to auscultation bilaterally Cardio: regular rate and rhythm, S1, S2 normal, no murmur, click, rub or gallop GI: soft, non-tender; bowel sounds normal; no masses,  no organomegaly Extremities: Left hand and forearm covered in dressing. Moving fingers. Pulses: 2+ and symmetric Neurologic: Grossly normal  Lab Results:  Basename 09/17/11 1055 09/17/11 1033  WBC -- 9.3  HGB 13.9 14.7  HCT 41.0 41.5  PLT -- 143*   BMET  Basename 09/17/11 1055  NA 139  K 4.3  CL 103  CO2 --  GLUCOSE 111*  BUN 11  CREATININE 1.00  CALCIUM --  ALT --    Studies/Results: Dg Hand Complete Left  09/17/2011  *RADIOLOGY REPORT*  Clinical Data: Dog bite, redness, swelling  LEFT HAND - COMPLETE 3+ VIEW  Comparison: 09/15/2011  Findings: Diffuse soft tissue swelling evident.  Normal alignment. No fracture.  Degenerative arthritis of the left first [facility] joint and the fingers.  No radiopaque foreign body.  IMPRESSION: Increased soft tissue swelling.  No acute osseous finding.  Wrist and finger osteoarthritis  Original Report Authenticated By: Judie Petit. Ruel Favors, M.D.    Medications:  Scheduled:   . atorvastatin  10 mg Oral QHS  . bimatoprost  1 drop Both Eyes QHS  .  enoxaparin (LOVENOX) injection  40 mg Subcutaneous Q24H  . fluticasone  1 spray Each Nare Daily  . levofloxacin (LEVAQUIN) IV  750 mg Intravenous Q24H  . levothyroxine  25 mcg Oral Q0600  . traZODone  100 mg Oral QHS  . vancomycin  1,250 mg Intravenous Q24H  . vitamin C  1,000 mg Oral Daily  . DISCONTD: ampicillin-sulbactam (UNASYN) IV  3 g Intravenous Q6H  . DISCONTD: atorvastatin  10 mg Oral QPM   Continuous:   . sodium chloride 1,000 mL (09/18/11 0534)    XBJ:[id number], acetaminophen, acetaminophen, albuterol, famotidine, HYDROcodone-acetaminophen, morphine injection, ondansetron (ZOFRAN) IV, ondansetron, ondansetron, oxyCODONE, oxyCODONE-acetaminophen, promethazine, traZODone, DISCONTD:  HYDROmorphone (DILAUDID) injection, DISCONTD:  morphine injection, DISCONTD: neomycin-bacitracin-polymyxin, DISCONTD: ondansetron (ZOFRAN) IV, DISCONTD: ondansetron (ZOFRAN) IV, DISCONTD: ondansetron DISCONTD: ondansetron, DISCONTD: promethazine, DISCONTD: sodium chloride irrigation  Assessment/Plan:  Principal Problem:  *Cellulitis and abscess of hand Active Problems:  Dog bite  Hypothyroidism    Cellulitis and abscess of the left hand secondary to dog bite Status post debridement and tenosynovectomy Continue Vancomycin and Levaquin. Await cultures. Pain is under good control. Further management per hand surgeons.  History of hypothyroidism Continue with Synthroid.   History of hyperlipidemia This is stable.   DVT, prophylaxis Enoxaparin  Code Status She is a full code.    LOS: 1 day   [facility]  [facility] Pager [phone] 09/18/2011, 3:02 PM

## 2011-09-18 NOTE — Evaluation (Signed)
Occupational Therapy Evaluation Patient Details Name: Carly [name] MRN: [id number] DOB: 1941-02-01 Today's Date: 09/18/2011 Time: 7829-5621 OT Time Calculation (min): 21 min  OT Assessment / Plan / Recommendation Clinical Impression  Pt presents to OT with decreased function of LUe s/p dog bite. Pt will benefit from OT to ensure finger AROM and decrease swelling and edema.    OT Assessment  Patient needs continued OT Services    Follow Up Recommendations  Outpatient OT;Other (comment) (as MD indicates)       Equipment Recommendations  None recommended by OT       Frequency  Min 2X/week           ADL  Eating/Feeding: Simulated;Minimal assistance Where Assessed - Eating/Feeding: Edge of bed Grooming: Brushing hair;Minimal assistance Where Assessed - Grooming: Unsupported standing Upper Body Bathing: Simulated;Minimal assistance Where Assessed - Upper Body Bathing: Unsupported standing Lower Body Bathing: Simulated;Minimal assistance Where Assessed - Lower Body Bathing: Unsupported standing Upper Body Dressing: Simulated;Minimal assistance Where Assessed - Upper Body Dressing: Unsupported sitting Lower Body Dressing: Minimal assistance Where Assessed - Lower Body Dressing: Unsupported sit to stand Toilet Transfer: Performed;Supervision/safety Toilet Transfer Method: Sit to stand;Other (comment) (walking to and from bathroom) Toilet Transfer Equipment: Regular height toilet Toileting - Clothing Manipulation and Hygiene: Simulated;Min guard Where Assessed - Toileting Clothing Manipulation and Hygiene: Standing Transfers/Ambulation Related to ADLs: Instructed pt in one handed techniques, as well as moving left fingers and keeping LUE elevated to further prevent swelling    OT Diagnosis: Generalized weakness  OT Problem List:   OT Treatment Interventions: Self-care/ADL training;Patient/family education   OT Goals Acute Rehab OT Goals OT Goal Formulation: With  patient Arm Goals Additional Arm Goal #1: Pt will move fingers actively, as well as keep LUE elevated to decrease and prevent swelling.  Visit Information  Last OT Received On: 09/18/11       Prior Functioning  Vision/Perception  Home Living Available Help at Discharge: Family Type of Home: House Home Access: Stairs to enter Home Layout: One level Bathroom Shower/Tub: Engineer, manufacturing systems: Standard Home Adaptive Equipment: None Prior Function Level of Independence: Independent Communication Communication: No difficulties Dominant Hand: Right      Cognition  Overall Cognitive Status: Appears within functional limits for tasks assessed/performed Arousal/Alertness: Awake/alert Orientation Level: Appears intact for tasks assessed Behavior During Session: [facility] for tasks performed    Extremity/Trunk Assessment Right Upper Extremity Assessment RUE ROM/Strength/Tone: [facility] for tasks assessed Left Upper Extremity Assessment LUE ROM/Strength/Tone: Deficits LUE Sensation: WFL - Light Touch LUE Coordination: Deficits (due to cast and swelling L UE)   Mobility Transfers Transfers: Sit to Stand;Stand to Sit Sit to Stand: 5: Supervision;From chair/3-in-1 Stand to Sit: 5: Supervision;To chair/3-in-1         End of Session OT - End of Session Activity Tolerance: Patient tolerated treatment well Patient left: in chair;with call bell/phone within reach  GO     [facility], [place] Kung 09/18/2011, 3:23 PM

## 2011-09-19 DIAGNOSIS — L02519 Cutaneous abscess of unspecified hand: Secondary | ICD-10-CM | POA: Diagnosis not present

## 2011-09-19 DIAGNOSIS — L03119 Cellulitis of unspecified part of limb: Secondary | ICD-10-CM | POA: Diagnosis not present

## 2011-09-19 DIAGNOSIS — E039 Hypothyroidism, unspecified: Secondary | ICD-10-CM | POA: Diagnosis not present

## 2011-09-19 LAB — BASIC METABOLIC PANEL
BUN: 11 mg/dL (ref 6–23)
CO2: 28 mEq/L (ref 19–32)
Calcium: 9.4 mg/dL (ref 8.4–10.5)
Chloride: 103 mEq/L (ref 96–112)
Creatinine, Ser: 0.8 mg/dL (ref 0.50–1.10)

## 2011-09-19 LAB — CBC
HCT: 40 % (ref 36.0–46.0)
MCH: 32.4 pg (ref 26.0–34.0)
MCV: 93.2 fL (ref 78.0–100.0)
Platelets: 184 10*3/uL (ref 150–400)
RBC: 4.29 MIL/uL (ref 3.87–5.11)
WBC: 5.9 10*3/uL (ref 4.0–10.5)

## 2011-09-19 MED ORDER — PNEUMOCOCCAL VAC POLYVALENT 25 MCG/0.5ML IJ INJ
0.5000 mL | INJECTION | INTRAMUSCULAR | Status: AC
  Administered 2011-09-20: 0.5 mL via INTRAMUSCULAR
  Filled 2011-09-19 (×2): qty 0.5

## 2011-09-19 MED ORDER — VANCOMYCIN HCL 1000 MG IV SOLR
750.0000 mg | Freq: Two times a day (BID) | INTRAVENOUS | Status: DC
  Administered 2011-09-19 – 2011-09-20 (×3): 750 mg via INTRAVENOUS
  Filled 2011-09-19 (×5): qty 750

## 2011-09-19 NOTE — Progress Notes (Signed)
PCP: Hollice Espy, MD  Brief HPI:  This is a 71 year old, Caucasian female, with past medical history of hyperlipidemia, and arthritis, who was in her usual state of health on Wednesday night when she was petting her dog. Something startled the dog and the dog bit her left hand. She bled quite profusely. She washed her hand with cold water and applied some Epsom salts. She also applied betadine and Neosporin. Her hand continued to get more swollen. The pain got severe and, so, she decided to go to her doctor's office on Friday. An x-ray was obtained. Subsequently, she was prescribed clindamycin and Bactrim. She took this on Friday and Saturday. The night before admission when she took the dressing off, a scab fell off and then yellowish pus started draining. This concerned her and, so, she decided to come in to the hospital. She had some chills. Denied any fever. She also noticed a redness in her hand. The pain was 5/10 in intensity and improved after she received pain medications. She had been taking ibuprofen at home for the pain. Denied any nausea, vomiting. She did receive a tetanus injection by her PCP on Friday. Her dog is current on immunization.  Past medical history:  Past Medical History  Diagnosis Date  . Allergic rhinitis     Skin Test 04-14-2009  . Asthma   . Hyperlipemia     Consultants: Dr. Amanda Pea (Hand Surgeon)  Procedures: I and D Left hand and wrist. Radical extensor tenosynovectomy on 7/21  Subjective: Patient feels well. Had a headache this morning. Currently feels fine. Denies significant left hand pain. No nausea today.  Objective: Vital signs in last 24 hours: Temp:  [98.1 F (36.7 C)-98.6 F (37 C)] 98.6 F (37 C) (07/23 0555) Pulse Rate:  [72-85] 72  (07/23 0555) Resp:  [18-19] 19  (07/23 0555) BP: (110-146)/(70-86) 144/86 mmHg (07/23 0555) SpO2:  [95 %-98 %] 95 % (07/23 0555) Weight change:  Last BM Date: 09/17/11  Intake/Output from previous  day: 07/22 0701 - 07/23 0700 In: 240 [P.O.:240] Out: -  Intake/Output this shift:    General appearance: alert, cooperative, appears stated age and no distress Resp: clear to auscultation bilaterally Cardio: regular rate and rhythm, S1, S2 normal, no murmur, click, rub or gallop Extremities: Left hand and forearm covered in dressing. Moving fingers. Pulses: 2+ and symmetric Neurologic: A&Ox3. No focal deficits.  Lab Results:  Basename 09/17/11 1055 09/17/11 1033  WBC -- 9.3  HGB 13.9 14.7  HCT 41.0 41.5  PLT -- 143*   BMET  Basename 09/17/11 1055  NA 139  K 4.3  CL 103  CO2 --  GLUCOSE 111*  BUN 11  CREATININE 1.00  CALCIUM --  ALT --    Studies/Results: Dg Hand Complete Left  09/17/2011  *RADIOLOGY REPORT*  Clinical Data: Dog bite, redness, swelling  LEFT HAND - COMPLETE 3+ VIEW  Comparison: 09/15/2011  Findings: Diffuse soft tissue swelling evident.  Normal alignment. No fracture.  Degenerative arthritis of the left first [facility] joint and the fingers.  No radiopaque foreign body.  IMPRESSION: Increased soft tissue swelling.  No acute osseous finding.  Wrist and finger osteoarthritis  Original Report Authenticated By: Judie Petit. Ruel Favors, M.D.    Medications:  Scheduled:    . atorvastatin  10 mg Oral QHS  . bimatoprost  1 drop Both Eyes QHS  . enoxaparin (LOVENOX) injection  40 mg Subcutaneous Q24H  . fluticasone  1 spray Each Nare Daily  . levofloxacin (LEVAQUIN)  IV  750 mg Intravenous Q24H  . levothyroxine  25 mcg Oral Q0600  . traZODone  100 mg Oral QHS  . vancomycin  1,250 mg Intravenous Q24H  . vitamin C  1,000 mg Oral Daily   Continuous:    . sodium chloride 1,000 mL (09/18/11 0534)   JXB:JYNWGNFAOZHYQ, acetaminophen, albuterol, famotidine, HYDROcodone-acetaminophen, morphine injection, ondansetron, ondansetron, oxyCODONE, promethazine, traZODone, DISCONTD: oxyCODONE-acetaminophen, DISCONTD: promethazine  Assessment/Plan:  Principal Problem:   *Cellulitis and abscess of hand Active Problems:  Dog bite  Hypothyroidism    Labs Pending today  Cellulitis and abscess of the left hand secondary to dog bite Status post debridement and tenosynovectomy on 7/21 with plans for possible debridement today as well. Continue Vancomycin and Levaquin. Await cultures. Pain is under good control. Further management per hand surgeons.  History of hypothyroidism Continue with Synthroid.   History of hyperlipidemia This is stable.   DVT, prophylaxis Enoxaparin  Code Status She is a full code.  Discussed with Dr. Carlos Levering PA. They will evaluate patient today and will likely take over care.   LOS: 2 days   [facility]  [facility] Pager [phone] 09/19/2011, 7:21 AM

## 2011-09-19 NOTE — Progress Notes (Signed)
Occupational Therapy Treatment Patient Details Name: Carly [name] MRN: [id number] DOB: 11/11/1940 Today's Date: 09/19/2011 Time: 4098-1191 OT Time Calculation (min): 14 min  OT Assessment / Plan / Recommendation Comments on Treatment Session Reinterated importance of elevation of L hand to prevent further swelling and decrease swelling that is present currently, as well as ROM of L fingers          Equipment Recommendations  None recommended by OT          Plan Discharge plan remains appropriate           ADL  ADL Comments: Pt performed 2 sets of 10 reps each of finger flexion/extension, elbow flexion/extension, and shoulder flexion to help decrease edema in L fingers.  OT also performed retrograde massage on L fingers and instructed pt to perform again tonight as well.  Pt transitioned from bed to chair with S.        OT Goals Arm Goals Additional Arm Goal #1: progressing toward goal  Visit Information  Last OT Received On: 09/19/11    Subjective Data  Subjective: i have tried to move my fingers, but they are still swollen      Cognition  Overall Cognitive Status: Appears within functional limits for tasks assessed/performed Arousal/Alertness: Awake/alert Orientation Level: Appears intact for tasks assessed Behavior During Session: [facility] for tasks performed    Mobility Bed Mobility Bed Mobility: Supine to Sit Supine to Sit: 5: Supervision Transfers Transfers: Sit to Stand;Stand to Sit Sit to Stand: From bed;5: Supervision;Without upper extremity assist Stand to Sit: To chair/3-in-1;5: Supervision;Without upper extremity assist         End of Session OT - End of Session Activity Tolerance: Patient tolerated treatment well Patient left: in chair;with call bell/phone within reach       [facility], [place] Kung 09/19/2011, 3:18 PM

## 2011-09-19 NOTE — Progress Notes (Addendum)
ANTIBIOTIC CONSULT NOTE - FOLLOW UP  Pharmacy Consult for Vancomycin Indication: cellulitis  Allergies  Allergen Reactions  . Cephalexin Hives and Swelling  . Nabumetone Hives and Swelling    Patient Measurements: Weight: 165 lb (74.844 kg)   Vital Signs: Temp: 98.6 F (37 C) (07/23 0555) Temp src: Oral (07/22 2148) BP: 144/86 mmHg (07/23 0555) Pulse Rate: 72  (07/23 0555) Intake/Output from previous day: 07/22 0701 - 07/23 0700 In: 240 [P.O.:240] Out: -  Intake/Output from this shift: Total I/O In: 240 [P.O.:240] Out: -   Labs:  Basename 09/19/11 0630 09/17/11 1055 09/17/11 1033  WBC 5.9 -- 9.3  HGB 13.9 13.9 14.7  PLT 184 -- 143*  LABCREA -- -- --  CREATININE 0.80 1.00 --   The CrCl is unknown because both a height and weight (above a minimum accepted value) are required for this calculation. No results found for this basename: VANCOTROUGH:2,VANCOPEAK:2,VANCORANDOM:2,GENTTROUGH:2,GENTPEAK:2,GENTRANDOM:2,TOBRATROUGH:2,TOBRAPEAK:2,TOBRARND:2,AMIKACINPEAK:2,AMIKACINTROU:2,AMIKACIN:2, in the last 72 hours   Microbiology: Recent Results (from the past 720 hour(s))  CULTURE, BLOOD (ROUTINE X 2)     Status: Normal (Preliminary result)   Collection Time   09/17/11 12:50 PM      Component Value Range Status Comment   Specimen Description BLOOD ARM RIGHT   Final    Special Requests BOTTLES DRAWN AEROBIC AND ANAEROBIC 10CC   Final    Culture  Setup Time 09/17/2011 19:14   Final    Culture     Final    Value:        BLOOD CULTURE RECEIVED NO GROWTH TO DATE CULTURE WILL BE HELD FOR 5 DAYS BEFORE ISSUING A FINAL NEGATIVE REPORT   Report Status PENDING   Incomplete   CULTURE, BLOOD (ROUTINE X 2)     Status: Normal (Preliminary result)   Collection Time   09/17/11  1:00 PM      Component Value Range Status Comment   Specimen Description BLOOD ARM RIGHT   Final    Special Requests BOTTLES DRAWN AEROBIC AND ANAEROBIC 10CC   Final    Culture  Setup Time 09/17/2011 19:14    Final    Culture     Final    Value:        BLOOD CULTURE RECEIVED NO GROWTH TO DATE CULTURE WILL BE HELD FOR 5 DAYS BEFORE ISSUING A FINAL NEGATIVE REPORT   Report Status PENDING   Incomplete   WOUND CULTURE     Status: Normal (Preliminary result)   Collection Time   09/17/11  1:35 PM      Component Value Range Status Comment   Specimen Description WOUND HAND   Final    Special Requests NONE   Final    Gram Stain     Final    Value: MODERATE WBC PRESENT, PREDOMINANTLY PMN     NO SQUAMOUS EPITHELIAL CELLS SEEN     NO ORGANISMS SEEN   Culture Culture reincubated for better growth   Final    Report Status PENDING   Incomplete   CULTURE, ROUTINE-ABSCESS     Status: Normal (Preliminary result)   Collection Time   09/17/11  2:40 PM      Component Value Range Status Comment   Specimen Description ABSCESS LEFT HAND   Final    Special Requests PATIENT ON FOLLOWING VANCOMYCIN,CLEOCIN,BACTRUM   Final    Gram Stain     Final    Value: NO WBC SEEN     NO SQUAMOUS EPITHELIAL CELLS SEEN     NO  ORGANISMS SEEN   Culture Culture reincubated for better growth   Final    Report Status PENDING   Incomplete   ANAEROBIC CULTURE     Status: Normal (Preliminary result)   Collection Time   09/17/11  2:40 PM      Component Value Range Status Comment   Specimen Description ABSCESS LEFT HAND   Final    Special Requests PATIENT ON FOLLOWING VANCOMYCIN,CLEOCIN,BACTRUM   Final    Gram Stain     Final    Value: NO WBC SEEN     NO SQUAMOUS EPITHELIAL CELLS SEEN     NO ORGANISMS SEEN   Culture     Final    Value: NO ANAEROBES ISOLATED; CULTURE IN PROGRESS FOR 5 DAYS   Report Status PENDING   Incomplete     Anti-infectives     Start     Dose/Rate Route Frequency Ordered Stop   09/19/11 1000   vancomycin (VANCOCIN) 750 mg in sodium chloride 0.9 % 150 mL IVPB        750 mg 150 mL/hr over 60 Minutes Intravenous Every 12 hours 09/19/11 0937     09/18/11 0800   vancomycin (VANCOCIN) 1,250 mg in sodium chloride  0.9 % 250 mL IVPB  Status:  Discontinued        1,250 mg 166.7 mL/hr over 90 Minutes Intravenous Every 24 hours 09/18/11 0521 09/19/11 0937   09/18/11 0600   levofloxacin (LEVAQUIN) IVPB 750 mg        750 mg 100 mL/hr over 90 Minutes Intravenous Every 24 hours 09/18/11 0508     09/18/11 0400   Ampicillin-Sulbactam (UNASYN) 3 g in sodium chloride 0.9 % 100 mL IVPB  Status:  Discontinued        3 g 100 mL/hr over 60 Minutes Intravenous Every 6 hours 09/18/11 0242 09/18/11 0705   09/17/11 1045   vancomycin (VANCOCIN) IVPB 1000 mg/200 mL premix        1,000 mg 200 mL/hr over 60 Minutes Intravenous  Once 09/17/11 1036 09/17/11 1216          Assessment: 71 y/o female s/p dog bite which required I&D for tenosynovitis, on day #3 vancomycin and day #2 Levaquin for cellulitis and abscess of hand.  Renal function improved, CrCl increased to 76 ml/min.  WBC is WNL and remains afebrile.  Cultures have not revealed anything.  7/21 Wound Cx: NGTD 7/21 Blood x 2: NGTD 7/21 Abscess Lt hand Cx: NGTD  Goal of Therapy:  Vancomycin trough level 10-15 mcg/ml  Plan:  1. Increase vancomycin to 750 mg q12h.   2. Continue to monitor CBC, cx, and vanc troughs.      Doris Cheadle, PharmD Clinical Pharmacist Pager: [phone] Phone: [phone] 09/19/2011 9:44 AM   Addendum by: Rolland Porter, Pharm.D., BCPS Clinical Pharmacist Pager: [phone] 09/19/2011 11:05 AM

## 2011-09-19 NOTE — Progress Notes (Signed)
Patient ID: Carly [name], female   DOB: 07-24-1940, 71 y.o.   MRN: [id number] .Marland Kitchen Subjective: The patient is doing very well today, she is having mininal pain. She denies nausea or vomiting. Denies fever, chills, sob, cp. She is voiding without difficulties, positive flatus and bm. Tolerating PO's.   Objective: Vital signs in last 24 hours: Temp:  [98.1 F (36.7 C)-98.8 F (37.1 C)] 98.8 F (37.1 C) (07/23 1329) Pulse Rate:  [72-93] 93  (07/23 1329) Resp:  [18-19] 18  (07/23 1329) BP: (110-155)/(70-91) 155/91 mmHg (07/23 1329) SpO2:  [95 %-98 %] 98 % (07/23 1329) Weight:  [74.844 kg (165 lb)] 74.844 kg (165 lb) (07/23 0644)  Intake/Output from previous day: 07/22 0701 - 07/23 0700 In: 240 [P.O.:240] Out: -  Intake/Output this shift:     Basename 09/19/11 0630 09/17/11 1055 09/17/11 1033  HGB 13.9 13.9 14.7    Basename 09/19/11 0630 09/17/11 1055 09/17/11 1033  WBC 5.9 -- 9.3  RBC 4.29 -- 4.53  HCT 40.0 41.0 --  PLT 184 -- 143*    Basename 09/19/11 0630 09/17/11 1055  NA 139 139  K 4.0 4.3  CL 103 103  CO2 28 --  BUN 11 11  CREATININE 0.80 1.00  GLUCOSE 104* 111*  CALCIUM 9.4 --   No results found for this basename: LABPT:2,INR:2 in the last 72 hours  Cultures show no growth to date, but certainly may be skewed given prior clindamycin and bactrim use  PE: A & O x3, NAD Head: atraumatic, normocephalic Chest: equal expansions, respirations are nonlabored Abd: NT Left Upper extremity: Dressings removed, drains removed. There is significant improvement in sts and erythema, no purulence is expressed from I & D sites, scant serous exudate, rims of pre-necrotic tissue and wounds present. Digital ROM looks very good.  Assessment/Plan: S/P I & D Left hand purulent extensor tenosynovitis with ascending cellulitis, improved Hypothyroidism Cellulitis and abscess of hand Dog bite Non-Hospital HYPERLIPIDEMIA ALLERGIC RHINITIS ASTHMA  Procedure We have discussed with the  patient proceeding with bedside I& D, verbal consent is obtained and the patient's hand was prepped at bedside. Excisional debridement utilizing orthopedic instruments was then implemented to the dorsal wounds x3.  The 2 smaller 1 cm wounds were excised of pre-necrotic and necrotic tissue. The 3rd  wound,2.5 cm, was then sharply debrided as well. Following excisional debridement the wounds were then copiously irrigated with saline and packed. Soft sterile dressings were applied, as well as, a volar splint. The patient tolerated the procedure well and there were no comlications.  Given the patients overall improvement and wound conditions, she will most likely be discharged tomorrow with close follow-up in the office. We will transition her back to PO antibiotics at time of discharge, but will wait until tomorrow to determine if any growth is noted per cultures. Overall, she is markedly improved. We will transfer her to our services given she is medically stable.   Jenalyn Girdner L 09/19/2011, 9:45 PM

## 2011-09-19 NOTE — Plan of Care (Signed)
Problem: Diagnosis - Type of Surgery Goal: General Surgical Patient Education (See Patient Education module for education specifics) L hand IND

## 2011-09-19 NOTE — Progress Notes (Signed)
UR COMPLETED  

## 2011-09-19 NOTE — Progress Notes (Signed)
Occupational Therapy Treatment Patient Details Name: TERIANA DANKER MRN: [id number] DOB: [birth year] Today's Date: 09/19/2011 Time: 4098-1191 OT Time Calculation (min): 17 min  OT Assessment / Plan / Recommendation                      Precautions / Restrictions Restrictions Weight Bearing Restrictions: No       ADL  ADL Comments: Pt sin bed and wanted to get to chair.  Pt S with mobility. Instructed in elevation for edema control, as well as finger, elbow and shoulder AROM. Pts fingers with increased edema this visit. Educated in retrograde massage and reinterated importance of elevation.  Son present for treatment      OT Goals ADL Goals Pt Will Transfer to Toilet: Independently;Comfort height toilet ADL Goal: Toilet Transfer - Progress: Goal set today Pt Will Perform Toileting - Clothing Manipulation: Independently ADL Goal: Toileting - Clothing Manipulation - Progress: Goal set today Pt Will Perform Toileting - Hygiene: Independently ADL Goal: Toileting - Hygiene - Progress: Goal set today  Visit Information  Last OT Received On: 09/19/11          Cognition  Overall Cognitive Status: Appears within functional limits for tasks assessed/performed Arousal/Alertness: Awake/alert Orientation Level: Appears intact for tasks assessed Behavior During Session: [facility] for tasks performed    Mobility Bed Mobility Bed Mobility: Supine to Sit Supine to Sit: 5: Supervision Transfers Transfers: Sit to Stand;Stand to Sit Sit to Stand: From bed;5: Supervision;Without upper extremity assist Stand to Sit: To chair/3-in-1;5: Supervision;Without upper extremity assist         End of Session OT - End of Session Activity Tolerance: Patient tolerated treatment well Patient left: in chair;with family/visitor present;with call bell/phone within reach       [facility], [place] Kung 09/19/2011, 9:58 AM

## 2011-09-19 NOTE — Anesthesia Postprocedure Evaluation (Signed)
  Anesthesia Post-op Note  Patient: Carly [name]  Procedure(s) Performed: Procedure(s) (LRB): IRRIGATION AND DEBRIDEMENT EXTREMITY (Left)  Patient Location: PACU  Anesthesia Type: General  Level of Consciousness: awake, alert  and oriented  Airway and Oxygen Therapy: Patient Spontanous Breathing  Post-op Pain: mild  Post-op Assessment: Post-op Vital signs reviewed and Patient's Cardiovascular Status Stable  Post-op Vital Signs: stable  Complications: No apparent anesthesia complications

## 2011-09-20 DIAGNOSIS — L02519 Cutaneous abscess of unspecified hand: Secondary | ICD-10-CM | POA: Diagnosis not present

## 2011-09-20 DIAGNOSIS — E785 Hyperlipidemia, unspecified: Secondary | ICD-10-CM

## 2011-09-20 DIAGNOSIS — L03119 Cellulitis of unspecified part of limb: Secondary | ICD-10-CM | POA: Diagnosis not present

## 2011-09-20 DIAGNOSIS — E039 Hypothyroidism, unspecified: Secondary | ICD-10-CM | POA: Diagnosis not present

## 2011-09-20 DIAGNOSIS — T148XXA Other injury of unspecified body region, initial encounter: Secondary | ICD-10-CM | POA: Diagnosis not present

## 2011-09-20 DIAGNOSIS — W540XXA Bitten by dog, initial encounter: Secondary | ICD-10-CM | POA: Diagnosis not present

## 2011-09-20 LAB — WOUND CULTURE

## 2011-09-20 LAB — CULTURE, ROUTINE-ABSCESS

## 2011-09-20 MED ORDER — HYDROCODONE-ACETAMINOPHEN 5-325 MG PO TABS: 1.0000 | ORAL_TABLET | ORAL | Status: AC | PRN

## 2011-09-20 NOTE — Discharge Summary (Signed)
Physician Discharge Summary  Patient ID: Carly [name] MRN: [id number] DOB/AGE: 1940-05-10 71 y.o.  Admit date: 09/17/2011 Discharge date: 09/20/2011  Admission Diagnoses: Dog bite left hand with purulent extensor tenosynovitis and ascending cellulitis Hypothyroidism Cellulitis and abscess of hand Dog bite Non-Hospital HYPERLIPIDEMIA ALLERGIC RHINITIS ASTHMA   Discharge Diagnoses:  Same, improved S/P Multilple I&D's performed to left hand with cultures positive for Pasturella multocida   Discharged Condition: improved  Hospital Course: The patient is a pleasant 71 year old female who presented emergently to the [facility] emergency room for evaluation of her left hand. The patient had sustained a dog bite to the dorsal aspect of her left hand, this was a domesticated dog and it vaccinations are up to date. She had that been seen and evaluated by her family physician and was started on a regime of Bactrim DS and clindamycin. She had increasing pain soft tissue swelling and erythema and was noted to have drainage consistent with purulence noted but she presented to the emergency room for evaluation. Hand and upper extremity surgery was consulted, given the fact that she had signs consistent with an abscess and extensor tenosynovitis purulent in nature as well as ascending cellulitis. The patient was seen and evaluated for her upper extremity given findings consistent with purulent extensor tenosynovitis the patient was admitted and the decision was made to proceed with operative intervention. The patient was seen and evaluated by medicine and cleared for surgery. Patient underwent urgent irrigation and excisional debridement about the upper extremity as well as tenosynovectomy. She tolerated the procedure well please see operative report for full details. The patient was admitted to the orthopedic floor with standard orders of elevation IV antibiotics in the form of Levaquin close observation and  daily wound care. She did very well throughout her postoperative course and each day had notable improvement the patient underwent an additional I and D. at bedside. She had significant improvement overall wounds soft tissue swelling and erythema. She had no constitutional symptoms. She was tolerating a regular diet without difficulties, voiding without difficulty, and was noted to have flatus as well as a bowel movement. Cultures ultimately showed pasteurella multocida, sensitivities were reviewed. On 09/20/2011 the patient is doing quite well she was in excellent spirits, digital range of motion look excellent she was having no difficulties and her pain was well controlled. The decision was made to discharge her home in stable condition. We will follow her closely and I discussed with her elevation wound care digital range of motion and we'll see her in our office tomorrow. She will resume her antibiotics of clindamycin and Bactrim DS. All issues were discussed at length. I should note on the day of discharge a bedside irrigation and debridement was performed to the upper extremity, please see procedure note for details. Consults: Hospitalist for medical coverage   Treatments: See operative reports  Discharge Exam: Blood pressure 155/76, pulse 77, temperature 98 F (36.7 C), temperature source Oral, resp. rate 18, weight 74.844 kg (165 lb), SpO2 96.00%. Marland Kitchen.Patient presents for evaluation and treatment of the of their upper extremity predicament. The patient denies neck back chest or of abdominal pain. The patient notes that they have no lower extremity problems. The patient from primarily complains of the upper extremity pain noted. Evaluation of the left upper extremity showed she had no return of purulence or erythema and soft tissue swelling had improved. The wounds were once again irrigated and excisional debridement at bedside she tolerated this well please see procedure  note for full details. Her  digital range of motion and looked very well overall there was no signs of ascending cellulitis or complicating features.  Disposition: Final discharge disposition not confirmed  Discharge Orders    Future Appointments: Provider: Department: Dept Phone: Center:   01/29/2012 10:30 AM Waymon Budge, MD [facility] [phone] None     Future Orders Please Complete By Expires   Diet - low sodium heart healthy      Call MD / Call 911      Comments:   If you experience chest pain or shortness of breath, CALL 911 and be transported to the hospital emergency room.  If you develope a fever above 101 F, pus (white drainage) or increased drainage or redness at the wound, or calf pain, call your surgeon's office.   Constipation Prevention      Comments:   Drink plenty of fluids.  Prune juice may be helpful.  You may use a stool softener, such as Colace (over the counter) 100 mg twice a day.  Use MiraLax (over the counter) for constipation as needed.   Increase activity slowly as tolerated      Scheduling Instructions:   Elevate hand frequently and move fingers frequently, keep dressings clean and dry   Discharge instructions      Comments:   Marland KitchenMarland KitchenWe recommend that you to take vitamin C 1000 mg a day to promote healing we also recommend that if you require her pain medicine that he take a stool softener to prevent constipation as most pain medicines will have constipation side effects. We recommend either Peri-Colace or Senokot and recommend that you also consider adding MiraLAX to prevent the constipation .Marland KitchenKeep bandage clean and dry.  Call for any problems.  No smoking.  Criteria for driving a car: you should be off your pain medicine for 7-8 hours, able to drive one handed(confident), thinking clearly and feeling able in your judgement to drive. Continue elevation as it will decrease swelling.  If instructed by MD move your fingers within the confines of the bandage/splint.  Use ice if instructed by  your MD. Call immediately for any sudden loss of feeling in your hand/arm or change in functional abilities of the extremity.affects from pain medicine if you are required to use them. These medicines are over the counter and maybe purchased at a local pharmacy.     Medication List  As of 09/20/2011  1:19 PM   TAKE these medications         atorvastatin 10 MG tablet   Commonly known as: LIPITOR   Take 10 mg by mouth every evening.      bimatoprost 0.03 % ophthalmic solution   Commonly known as: LUMIGAN   Place 1 drop into both eyes at bedtime.      clindamycin 150 MG capsule   Commonly known as: CLEOCIN   Take 450 mg by mouth 3 (three) times daily.      estradiol 0.05 MG/24HR   Commonly known as: VIVELLE-DOT   Place 1 patch onto the skin once a week.      GLUCOSAMINE 1500 COMPLEX PO   Take 1 capsule by mouth daily.      HYDROcodone-acetaminophen 5-325 MG per tablet   Commonly known as: NORCO/VICODIN   Take 1 tablet by mouth every 4 (four) hours as needed.      levothyroxine 25 MCG tablet   Commonly known as: SYNTHROID, LEVOTHROID   Take 25 mcg by mouth daily.  mometasone 50 MCG/ACT nasal spray   Commonly known as: NASONEX   Place 2 sprays into the nose daily.      sulfamethoxazole-trimethoprim 800-160 MG per tablet   Commonly known as: BACTRIM DS   Take 1 tablet by mouth every 12 (twelve) hours.      traZODone 100 MG tablet   Commonly known as: DESYREL   Take 100 mg by mouth at bedtime.           Follow-up Information    Follow up with Karen Chafe, MD on 09/21/2011. (follow up tomorrow at 11:00am at [facility] orthopaedics, you will see M.D. and therapy tomorrow)    Contact information:   [address] [place] [address] [phone]          Signed: Sheran Lawless 09/20/2011, 1:19 PM

## 2011-09-20 NOTE — Progress Notes (Signed)
Subjective: Feeling better today. No other specific concerns today.  Objective: Vital signs in last 24 hours: Filed Vitals:   09/19/11 0644 09/19/11 1329 09/19/11 2159 09/20/11 0553  BP:  155/91 163/87 155/76  Pulse:  93 82 77  Temp:  98.8 F (37.1 C) 98.8 F (37.1 C) 98 F (36.7 C)  TempSrc:  Oral    Resp:  18 19 18   Weight: 74.844 kg (165 lb)     SpO2:  98% 98% 96%   Weight change:   Intake/Output Summary (Last 24 hours) at 09/20/11 1000 Last data filed at 09/20/11 0431  Gross per 24 hour  Intake 3836.25 ml  Output      0 ml  Net 3836.25 ml    Physical Exam: General: Awake, Oriented, No acute distress. HEENT: EOMI. Neck: Supple CV: S1 and S2 Lungs: Clear to ascultation bilaterally Abdomen: Soft, Nontender, Nondistended, +bowel sounds. Ext: Good pulses. Trace edema. LUE in bandages.  Lab Results: Basic Metabolic Panel:  Lab 09/19/11 1610 09/17/11 1055  NA 139 139  K 4.0 4.3  CL 103 103  CO2 28 --  GLUCOSE 104* 111*  BUN 11 11  CREATININE 0.80 1.00  CALCIUM 9.4 --  MG -- --  PHOS -- --   Liver Function Tests: No results found for this basename: AST:5,ALT:5,ALKPHOS:5,BILITOT:5,PROT:5,ALBUMIN:5 in the last 168 hours No results found for this basename: LIPASE:5,AMYLASE:5 in the last 168 hours No results found for this basename: AMMONIA:5 in the last 168 hours CBC:  Lab 09/19/11 0630 09/17/11 1055 09/17/11 1033  WBC 5.9 -- 9.3  NEUTROABS -- -- 7.4  HGB 13.9 13.9 14.7  HCT 40.0 41.0 41.5  MCV 93.2 -- 91.6  PLT 184 -- 143*   Cardiac Enzymes: No results found for this basename: CKTOTAL:5,CKMB:5,CKMBINDEX:5,TROPONINI:5 in the last 168 hours BNP (last 3 results) No results found for this basename: PROBNP:3 in the last 8760 hours CBG: No results found for this basename: GLUCAP:5 in the last 168 hours No results found for this basename: HGBA1C:5 in the last 72 hours Other Labs: No components found with this basename: POCBNP:3 No results found for this  basename: DDIMER:2 in the last 168 hours No results found for this basename: CHOL:2,HDL:2,LDLCALC:2,TRIG:2,CHOLHDL:2,LDLDIRECT:2 in the last 168 hours No results found for this basename: TSH,T4TOTAL,FREET3,T3FREE,FREET4,THYROIDAB in the last 168 hours No results found for this basename: VITAMINB12:2,FOLATE:2,FERRITIN:2,TIBC:2,IRON:2,RETICCTPCT:2 in the last 168 hours  Micro Results: Recent Results (from the past 240 hour(s))  CULTURE, BLOOD (ROUTINE X 2)     Status: Normal (Preliminary result)   Collection Time   09/17/11 12:50 PM      Component Value Range Status Comment   Specimen Description BLOOD ARM RIGHT   Final    Special Requests BOTTLES DRAWN AEROBIC AND ANAEROBIC 10CC   Final    Culture  Setup Time 09/17/2011 19:14   Final    Culture     Final    Value:        BLOOD CULTURE RECEIVED NO GROWTH TO DATE CULTURE WILL BE HELD FOR 5 DAYS BEFORE ISSUING A FINAL NEGATIVE REPORT   Report Status PENDING   Incomplete   CULTURE, BLOOD (ROUTINE X 2)     Status: Normal (Preliminary result)   Collection Time   09/17/11  1:00 PM      Component Value Range Status Comment   Specimen Description BLOOD ARM RIGHT   Final    Special Requests BOTTLES DRAWN AEROBIC AND ANAEROBIC 10CC   Final    Culture  Setup Time 09/17/2011 19:14   Final    Culture     Final    Value:        BLOOD CULTURE RECEIVED NO GROWTH TO DATE CULTURE WILL BE HELD FOR 5 DAYS BEFORE ISSUING A FINAL NEGATIVE REPORT   Report Status PENDING   Incomplete   WOUND CULTURE     Status: Normal   Collection Time   09/17/11  1:35 PM      Component Value Range Status Comment   Specimen Description WOUND HAND   Final    Special Requests NONE   Final    Gram Stain     Final    Value: MODERATE WBC PRESENT, PREDOMINANTLY PMN     NO SQUAMOUS EPITHELIAL CELLS SEEN     NO ORGANISMS SEEN   Culture     Final    Value: PASTEURELLA SPECIES     Note: Usually susceptible to penicillin and other beta lactam agents,quinolones,macrolides and  tetracyclines.   Report Status 09/20/2011 FINAL   Final   CULTURE, ROUTINE-ABSCESS     Status: Normal   Collection Time   09/17/11  2:40 PM      Component Value Range Status Comment   Specimen Description ABSCESS LEFT HAND   Final    Special Requests PATIENT ON FOLLOWING VANCOMYCIN,CLEOCIN,BACTRUM   Final    Gram Stain     Final    Value: NO WBC SEEN     NO SQUAMOUS EPITHELIAL CELLS SEEN     NO ORGANISMS SEEN   Culture     Final    Value: PASTEURELLA SPECIES     Note: Usually susceptible to penicillin and other beta lactam agents,quinolones,macrolides and tetracyclines.   Report Status 09/20/2011 FINAL   Final   ANAEROBIC CULTURE     Status: Normal (Preliminary result)   Collection Time   09/17/11  2:40 PM      Component Value Range Status Comment   Specimen Description ABSCESS LEFT HAND   Final    Special Requests PATIENT ON FOLLOWING VANCOMYCIN,CLEOCIN,BACTRUM   Final    Gram Stain     Final    Value: NO WBC SEEN     NO SQUAMOUS EPITHELIAL CELLS SEEN     NO ORGANISMS SEEN   Culture     Final    Value: NO ANAEROBES ISOLATED; CULTURE IN PROGRESS FOR 5 DAYS   Report Status PENDING   Incomplete     Studies/Results: No results found.  Medications: I have reviewed the patient's current medications. Scheduled Meds:   . atorvastatin  10 mg Oral QHS  . bimatoprost  1 drop Both Eyes QHS  . enoxaparin (LOVENOX) injection  40 mg Subcutaneous Q24H  . fluticasone  1 spray Each Nare Daily  . levofloxacin (LEVAQUIN) IV  750 mg Intravenous Q24H  . levothyroxine  25 mcg Oral Q0600  . pneumococcal 23 valent vaccine  0.5 mL Intramuscular Tomorrow-1000  . traZODone  100 mg Oral QHS  . vancomycin  750 mg Intravenous Q12H  . vitamin C  1,000 mg Oral Daily   Continuous Infusions:   . sodium chloride 75 mL/hr at 09/20/11 0405   PRN Meds:.acetaminophen, acetaminophen, albuterol, famotidine, HYDROcodone-acetaminophen, morphine injection, ondansetron, ondansetron, oxyCODONE, promethazine,  traZODone  Assessment/Plan: Cellulitis and abscess of the left hand secondary to dog bite  Status post debridement and tenosynovectomy on 7/21 with plans for possible debridement today as well. Continue Vancomycin and Levaquin. Cultures show Pasteurella, sensitive to penicillin, beta lactam agents,  quinolones, macrolides and tetracyclines. Pain is under good control. Further management per hand surgeons. Patient had tetanus vaccine on admission. Dog's vaccines are uptodate and under quarantine per patient.  History of hypothyroidism  Continue with Synthroid.   History of hyperlipidemia  Stable.   DVT, prophylaxis  Enoxaparin   Code Status  Full code.   Discussed with Dr. Carlos Levering PA. They will assume patient's care. Will signoff please call with any questions or concerns.    LOS: 3 days  Merla Sawka A, MD 09/20/2011, 10:00 AM

## 2011-09-21 DIAGNOSIS — M65839 Other synovitis and tenosynovitis, unspecified forearm: Secondary | ICD-10-CM | POA: Diagnosis not present

## 2011-09-21 DIAGNOSIS — M65849 Other synovitis and tenosynovitis, unspecified hand: Secondary | ICD-10-CM | POA: Diagnosis not present

## 2011-09-21 NOTE — Op Note (Signed)
NAMEFRANNIE, SHEDRICK             ACCOUNT NO.:  [id number]  MEDICAL RECORD NO.:  [id number]  LOCATION:                                 FACILITY:  PHYSICIAN:  Dionne Ano. Dreshawn Hendershott, M.D.DATE OF BIRTH:  [birth year]  DATE OF PROCEDURE: DATE OF DISCHARGE:                              OPERATIVE REPORT   Ms. Hammett was seen at bedside.  She underwent a dressing change. Following dressing change, I removed her wicks. Following this, I performed I and D of skin and subcutaneous tissue about 3 separate site, left hand.  Underwent irrigation and debridement of skin and subcutaneous tissue.  This was an excisional debridement with lavage of the areas followed by packing.  She tolerated this well.  Sterile dressing was applied.  Following the irrigation and debridement, we discussed her discharge, RTC in our office tomorrow, and we will ask her to notify should any problems occur.  We will get things set up for discharge.  Please see her discharge summary for discharge course.     Dionne Ano. Amanda Pea, M.D.     [facility]  D:  09/20/2011  T:  09/20/2011  Job:  [id number]

## 2011-09-22 DIAGNOSIS — M65839 Other synovitis and tenosynovitis, unspecified forearm: Secondary | ICD-10-CM | POA: Diagnosis not present

## 2011-09-22 DIAGNOSIS — M65849 Other synovitis and tenosynovitis, unspecified hand: Secondary | ICD-10-CM | POA: Diagnosis not present

## 2011-09-22 LAB — ANAEROBIC CULTURE: Gram Stain: NONE SEEN

## 2011-09-23 LAB — CULTURE, BLOOD (ROUTINE X 2): Culture: NO GROWTH

## 2011-09-25 DIAGNOSIS — M65839 Other synovitis and tenosynovitis, unspecified forearm: Secondary | ICD-10-CM | POA: Diagnosis not present

## 2011-09-25 DIAGNOSIS — M65849 Other synovitis and tenosynovitis, unspecified hand: Secondary | ICD-10-CM | POA: Diagnosis not present

## 2011-09-27 DIAGNOSIS — M65849 Other synovitis and tenosynovitis, unspecified hand: Secondary | ICD-10-CM | POA: Diagnosis not present

## 2011-09-27 DIAGNOSIS — M65839 Other synovitis and tenosynovitis, unspecified forearm: Secondary | ICD-10-CM | POA: Diagnosis not present

## 2011-09-29 DIAGNOSIS — M65839 Other synovitis and tenosynovitis, unspecified forearm: Secondary | ICD-10-CM | POA: Diagnosis not present

## 2011-09-29 DIAGNOSIS — M65849 Other synovitis and tenosynovitis, unspecified hand: Secondary | ICD-10-CM | POA: Diagnosis not present

## 2011-10-03 DIAGNOSIS — S61409A Unspecified open wound of unspecified hand, initial encounter: Secondary | ICD-10-CM | POA: Diagnosis not present

## 2011-10-04 DIAGNOSIS — H409 Unspecified glaucoma: Secondary | ICD-10-CM | POA: Diagnosis not present

## 2011-10-04 DIAGNOSIS — H4011X0 Primary open-angle glaucoma, stage unspecified: Secondary | ICD-10-CM | POA: Diagnosis not present

## 2011-10-12 ENCOUNTER — Other Ambulatory Visit: Payer: Self-pay | Admitting: Physician Assistant

## 2011-10-12 DIAGNOSIS — L723 Sebaceous cyst: Secondary | ICD-10-CM | POA: Diagnosis not present

## 2011-11-29 DIAGNOSIS — K219 Gastro-esophageal reflux disease without esophagitis: Secondary | ICD-10-CM | POA: Diagnosis not present

## 2011-11-29 DIAGNOSIS — Z79899 Other long term (current) drug therapy: Secondary | ICD-10-CM | POA: Diagnosis not present

## 2011-11-29 DIAGNOSIS — R609 Edema, unspecified: Secondary | ICD-10-CM | POA: Diagnosis not present

## 2011-11-29 DIAGNOSIS — E039 Hypothyroidism, unspecified: Secondary | ICD-10-CM | POA: Diagnosis not present

## 2011-11-29 DIAGNOSIS — Z23 Encounter for immunization: Secondary | ICD-10-CM | POA: Diagnosis not present

## 2011-11-29 DIAGNOSIS — Z1331 Encounter for screening for depression: Secondary | ICD-10-CM | POA: Diagnosis not present

## 2011-11-29 DIAGNOSIS — Z0000 Encounter for general adult medical examination without abnormal findings: Secondary | ICD-10-CM | POA: Diagnosis not present

## 2011-11-29 DIAGNOSIS — E7800 Pure hypercholesterolemia, unspecified: Secondary | ICD-10-CM | POA: Diagnosis not present

## 2012-01-29 ENCOUNTER — Encounter: Payer: Self-pay | Admitting: Internal Medicine

## 2012-01-29 ENCOUNTER — Ambulatory Visit (INDEPENDENT_AMBULATORY_CARE_PROVIDER_SITE_OTHER): Payer: Medicare Other | Admitting: Internal Medicine

## 2012-01-29 VITALS — BP 122/62 | HR 84 | Ht 63.0 in | Wt 166.8 lb

## 2012-01-29 DIAGNOSIS — J309 Allergic rhinitis, unspecified: Secondary | ICD-10-CM | POA: Diagnosis not present

## 2012-01-29 DIAGNOSIS — J45909 Unspecified asthma, uncomplicated: Secondary | ICD-10-CM | POA: Diagnosis not present

## 2012-01-29 DIAGNOSIS — J45998 Other asthma: Secondary | ICD-10-CM

## 2012-01-29 MED ORDER — FLUTICASONE PROPIONATE 50 MCG/ACT NA SUSP: 2.0000 | Freq: Every day | NASAL | Status: DC

## 2012-01-29 MED ORDER — PHENYLEPHRINE HCL 1 % NA SOLN
3.0000 [drp] | Freq: Once | NASAL | Status: AC
  Administered 2012-01-29: 3 [drp] via NASAL

## 2012-01-29 MED ORDER — METHYLPREDNISOLONE ACETATE 80 MG/ML IJ SUSP
80.0000 mg | Freq: Once | INTRAMUSCULAR | Status: AC
  Administered 2012-01-29: 80 mg via INTRAMUSCULAR

## 2012-01-29 NOTE — Progress Notes (Signed)
01/25/11 25 yoF former smoker followed for allergic rhinitis, asthma. PCP Dr Coralyn Mark LOV-04/09/09 Has had flu vaccine. Husband continues to be treated at [facility] for idiopathic pulmonary fibrosis. She had done well for the past year as she continues allergy vaccine at 1:10 with no problems. Now in the past 3 weeks she has had a cold. Her primary physician gave a Z-Pak. She felt almost completely well but yesterday started a new round with dry cough, nasal congestion but no fever or sore throat. We reviewed her medications and discuss supportive therapy, fluids etc.  01/29/12- 61 yoF former smoker followed for allergic rhinitis, asthma. PCP Dr Coralyn Mark FOLLOWS NFA:[id number] up in nasal area(has been at holiday parties); yearly check up Says she has been doing well except for recent nasal congestion and mild cough. Denies fever or purulent discharge. No headache or ear ache. Using Flonase. Had infected dog bite on left hand needing surgical attention but now resolved.  ROS-see HPI Constitutional:   No-   weight loss, night sweats, fevers, chills, fatigue, lassitude. HEENT:   No-  headaches, difficulty swallowing, tooth/dental problems, sore throat,       No-  sneezing, itching, ear ache, +nasal congestion, post nasal drip,  CV:  No-   chest pain, orthopnea, PND, swelling in lower extremities, anasarca, dizziness, palpitations Resp: No-   shortness of breath with exertion or at rest.              No-   productive cough,  + non-productive cough,  No- coughing up of blood.              No-   change in color of mucus.  No- wheezing.   Skin: No-   rash or lesions. GI:  No-   heartburn, indigestion, abdominal pain, nausea, vomiting,  GU:  MS:  No-   joint pain or swelling.   Neuro-     nothing unusual Psych:  No- change in mood or affect. No depression or anxiety.  No memory loss.  OBJ General- Alert, Oriented, Affect-appropriate, Distress- none acute Skin- rash-none, lesions- none, excoriation-  none Lymphadenopathy- none Head- atraumatic            Eyes- Gross vision intact, PERRLA, conjunctivae clear secretions            Ears- Hearing, canals-normal            Nose- + crusting yellow, no-Septal dev, mucus, polyps, erosion, perforation             Throat- Mallampati III-IV- can't visualize post pharynx , , drainage- none, tonsils- atrophic Neck- flexible , trachea midline, no stridor , thyroid nl, carotid no bruit Chest - symmetrical excursion , unlabored           Heart/CV- RRR , no murmur , no gallop  , no rub, nl s1 s2                           - JVD- none , edema- none, stasis changes- none, varices- none           Lung- clear, dullness-none, rub- none           Chest wall-  Abd-  Br/ Gen/ Rectal- Not done, not indicated Extrem- cyanosis- none, clubbing, none, atrophy- none, strength- nl Neuro- grossly intact to observation

## 2012-01-29 NOTE — Patient Instructions (Addendum)
Script for Flonase/ fluticasone nasal spray to use instead of Nasonex  Neb neo nasal  Depo 80  Please call as needed

## 2012-02-01 ENCOUNTER — Other Ambulatory Visit: Payer: Self-pay | Admitting: Gynecology

## 2012-02-01 DIAGNOSIS — Z1231 Encounter for screening mammogram for malignant neoplasm of breast: Secondary | ICD-10-CM

## 2012-02-09 NOTE — Assessment & Plan Note (Signed)
Probably viral upper respiratory infection this time. Plan-refill Flonase, nasal decongestant nebulizer, Depo-Medrol, saline rinse

## 2012-02-09 NOTE — Assessment & Plan Note (Signed)
Watch for exacerbation of asthma if this progresses, as discussed.

## 2012-02-13 DIAGNOSIS — R109 Unspecified abdominal pain: Secondary | ICD-10-CM | POA: Diagnosis not present

## 2012-02-13 DIAGNOSIS — K529 Noninfective gastroenteritis and colitis, unspecified: Secondary | ICD-10-CM | POA: Diagnosis not present

## 2012-03-05 ENCOUNTER — Other Ambulatory Visit: Payer: Self-pay | Admitting: Gynecology

## 2012-03-05 DIAGNOSIS — I10 Essential (primary) hypertension: Secondary | ICD-10-CM | POA: Diagnosis not present

## 2012-03-05 DIAGNOSIS — Z9189 Other specified personal risk factors, not elsewhere classified: Secondary | ICD-10-CM | POA: Diagnosis not present

## 2012-03-05 DIAGNOSIS — G4700 Insomnia, unspecified: Secondary | ICD-10-CM | POA: Diagnosis not present

## 2012-03-15 ENCOUNTER — Ambulatory Visit
Admission: RE | Admit: 2012-03-15 | Discharge: 2012-03-15 | Disposition: A | Discharge status: Home / Self Care | Payer: Medicare Other | Source: Ambulatory Visit | Attending: Gynecology | Admitting: Gynecology

## 2012-03-15 DIAGNOSIS — Z1231 Encounter for screening mammogram for malignant neoplasm of breast: Secondary | ICD-10-CM

## 2012-03-26 DIAGNOSIS — K529 Noninfective gastroenteritis and colitis, unspecified: Secondary | ICD-10-CM | POA: Diagnosis not present

## 2012-04-02 DIAGNOSIS — H409 Unspecified glaucoma: Secondary | ICD-10-CM | POA: Diagnosis not present

## 2012-04-02 DIAGNOSIS — H4011X0 Primary open-angle glaucoma, stage unspecified: Secondary | ICD-10-CM | POA: Diagnosis not present

## 2012-05-29 DIAGNOSIS — E039 Hypothyroidism, unspecified: Secondary | ICD-10-CM | POA: Diagnosis not present

## 2012-05-29 DIAGNOSIS — Z79899 Other long term (current) drug therapy: Secondary | ICD-10-CM | POA: Diagnosis not present

## 2012-05-29 DIAGNOSIS — E7800 Pure hypercholesterolemia, unspecified: Secondary | ICD-10-CM | POA: Diagnosis not present

## 2012-07-18 DIAGNOSIS — M1710 Unilateral primary osteoarthritis, unspecified knee: Secondary | ICD-10-CM | POA: Diagnosis not present

## 2012-07-24 DIAGNOSIS — M1710 Unilateral primary osteoarthritis, unspecified knee: Secondary | ICD-10-CM | POA: Diagnosis not present

## 2012-08-06 DIAGNOSIS — H409 Unspecified glaucoma: Secondary | ICD-10-CM | POA: Diagnosis not present

## 2012-08-06 DIAGNOSIS — H4011X0 Primary open-angle glaucoma, stage unspecified: Secondary | ICD-10-CM | POA: Diagnosis not present

## 2012-09-05 DIAGNOSIS — M1710 Unilateral primary osteoarthritis, unspecified knee: Secondary | ICD-10-CM | POA: Diagnosis not present

## 2012-09-13 DIAGNOSIS — M1710 Unilateral primary osteoarthritis, unspecified knee: Secondary | ICD-10-CM | POA: Diagnosis not present

## 2012-09-20 DIAGNOSIS — M1710 Unilateral primary osteoarthritis, unspecified knee: Secondary | ICD-10-CM | POA: Diagnosis not present

## 2012-10-11 DIAGNOSIS — R109 Unspecified abdominal pain: Secondary | ICD-10-CM | POA: Diagnosis not present

## 2012-10-11 DIAGNOSIS — R197 Diarrhea, unspecified: Secondary | ICD-10-CM | POA: Diagnosis not present

## 2012-10-11 DIAGNOSIS — K5900 Constipation, unspecified: Secondary | ICD-10-CM | POA: Diagnosis not present

## 2012-10-30 DIAGNOSIS — N390 Urinary tract infection, site not specified: Secondary | ICD-10-CM | POA: Diagnosis not present

## 2012-10-30 DIAGNOSIS — R350 Frequency of micturition: Secondary | ICD-10-CM | POA: Diagnosis not present

## 2012-10-30 DIAGNOSIS — R609 Edema, unspecified: Secondary | ICD-10-CM | POA: Diagnosis not present

## 2012-11-05 DIAGNOSIS — M1710 Unilateral primary osteoarthritis, unspecified knee: Secondary | ICD-10-CM | POA: Diagnosis not present

## 2012-12-02 DIAGNOSIS — R350 Frequency of micturition: Secondary | ICD-10-CM | POA: Diagnosis not present

## 2012-12-02 DIAGNOSIS — E7800 Pure hypercholesterolemia, unspecified: Secondary | ICD-10-CM | POA: Diagnosis not present

## 2012-12-02 DIAGNOSIS — M899 Disorder of bone, unspecified: Secondary | ICD-10-CM | POA: Diagnosis not present

## 2012-12-02 DIAGNOSIS — E039 Hypothyroidism, unspecified: Secondary | ICD-10-CM | POA: Diagnosis not present

## 2012-12-02 DIAGNOSIS — F411 Generalized anxiety disorder: Secondary | ICD-10-CM | POA: Diagnosis not present

## 2012-12-02 DIAGNOSIS — Z0000 Encounter for general adult medical examination without abnormal findings: Secondary | ICD-10-CM | POA: Diagnosis not present

## 2012-12-02 DIAGNOSIS — K219 Gastro-esophageal reflux disease without esophagitis: Secondary | ICD-10-CM | POA: Diagnosis not present

## 2012-12-02 DIAGNOSIS — Z79899 Other long term (current) drug therapy: Secondary | ICD-10-CM | POA: Diagnosis not present

## 2012-12-02 DIAGNOSIS — Z23 Encounter for immunization: Secondary | ICD-10-CM | POA: Diagnosis not present

## 2012-12-03 ENCOUNTER — Other Ambulatory Visit: Payer: Self-pay | Admitting: Family Medicine

## 2012-12-03 DIAGNOSIS — M8580 Other specified disorders of bone density and structure, unspecified site: Secondary | ICD-10-CM

## 2012-12-04 DIAGNOSIS — R109 Unspecified abdominal pain: Secondary | ICD-10-CM | POA: Diagnosis not present

## 2012-12-04 DIAGNOSIS — K5730 Diverticulosis of large intestine without perforation or abscess without bleeding: Secondary | ICD-10-CM | POA: Diagnosis not present

## 2012-12-04 DIAGNOSIS — K5900 Constipation, unspecified: Secondary | ICD-10-CM | POA: Diagnosis not present

## 2012-12-10 ENCOUNTER — Ambulatory Visit
Admission: RE | Admit: 2012-12-10 | Discharge: 2012-12-10 | Disposition: A | Discharge status: Home / Self Care | Payer: Medicare Other | Source: Ambulatory Visit | Attending: Family Medicine | Admitting: Family Medicine

## 2012-12-10 DIAGNOSIS — M8580 Other specified disorders of bone density and structure, unspecified site: Secondary | ICD-10-CM

## 2012-12-10 DIAGNOSIS — M899 Disorder of bone, unspecified: Secondary | ICD-10-CM | POA: Diagnosis not present

## 2013-01-01 ENCOUNTER — Other Ambulatory Visit: Payer: Self-pay | Admitting: Gastroenterology

## 2013-01-01 DIAGNOSIS — R197 Diarrhea, unspecified: Secondary | ICD-10-CM

## 2013-01-01 DIAGNOSIS — R109 Unspecified abdominal pain: Secondary | ICD-10-CM

## 2013-01-03 ENCOUNTER — Ambulatory Visit
Admission: RE | Admit: 2013-01-03 | Discharge: 2013-01-03 | Disposition: A | Discharge status: Home / Self Care | Payer: Medicare Other | Source: Ambulatory Visit | Attending: Gastroenterology | Admitting: Gastroenterology

## 2013-01-03 DIAGNOSIS — K5730 Diverticulosis of large intestine without perforation or abscess without bleeding: Secondary | ICD-10-CM | POA: Diagnosis not present

## 2013-01-03 DIAGNOSIS — R197 Diarrhea, unspecified: Secondary | ICD-10-CM

## 2013-01-03 DIAGNOSIS — R109 Unspecified abdominal pain: Secondary | ICD-10-CM

## 2013-01-03 MED ORDER — IOHEXOL 300 MG/ML  SOLN
100.0000 mL | Freq: Once | INTRAMUSCULAR | Status: AC | PRN
  Administered 2013-01-03: 100 mL via INTRAVENOUS

## 2013-01-27 DIAGNOSIS — R197 Diarrhea, unspecified: Secondary | ICD-10-CM | POA: Diagnosis not present

## 2013-02-04 DIAGNOSIS — H4011X0 Primary open-angle glaucoma, stage unspecified: Secondary | ICD-10-CM | POA: Diagnosis not present

## 2013-02-04 DIAGNOSIS — H409 Unspecified glaucoma: Secondary | ICD-10-CM | POA: Diagnosis not present

## 2013-02-04 DIAGNOSIS — H5210 Myopia, unspecified eye: Secondary | ICD-10-CM | POA: Diagnosis not present

## 2013-02-10 ENCOUNTER — Other Ambulatory Visit: Payer: Self-pay

## 2013-02-10 DIAGNOSIS — Z1231 Encounter for screening mammogram for malignant neoplasm of breast: Secondary | ICD-10-CM

## 2013-03-07 ENCOUNTER — Ambulatory Visit: Payer: Self-pay | Admitting: Gynecology

## 2013-03-12 DIAGNOSIS — K529 Noninfective gastroenteritis and colitis, unspecified: Secondary | ICD-10-CM | POA: Diagnosis not present

## 2013-03-12 DIAGNOSIS — R109 Unspecified abdominal pain: Secondary | ICD-10-CM | POA: Diagnosis not present

## 2013-03-17 ENCOUNTER — Ambulatory Visit
Admission: RE | Admit: 2013-03-17 | Discharge: 2013-03-17 | Disposition: A | Discharge status: Home / Self Care | Payer: Medicare Other | Source: Ambulatory Visit

## 2013-03-17 DIAGNOSIS — Z1231 Encounter for screening mammogram for malignant neoplasm of breast: Secondary | ICD-10-CM | POA: Diagnosis not present

## 2013-03-21 DIAGNOSIS — R300 Dysuria: Secondary | ICD-10-CM | POA: Diagnosis not present

## 2013-03-21 DIAGNOSIS — J3489 Other specified disorders of nose and nasal sinuses: Secondary | ICD-10-CM | POA: Diagnosis not present

## 2013-03-21 DIAGNOSIS — N390 Urinary tract infection, site not specified: Secondary | ICD-10-CM | POA: Diagnosis not present

## 2013-03-26 ENCOUNTER — Encounter: Payer: Self-pay | Admitting: Gynecology

## 2013-03-26 ENCOUNTER — Ambulatory Visit (INDEPENDENT_AMBULATORY_CARE_PROVIDER_SITE_OTHER): Payer: Medicare Other | Admitting: Gynecology

## 2013-03-26 ENCOUNTER — Other Ambulatory Visit (HOSPITAL_COMMUNITY)
Admission: RE | Admit: 2013-03-26 | Discharge: 2013-03-26 | Disposition: A | Discharge status: Home / Self Care | Payer: Medicare Other | Source: Ambulatory Visit | Attending: Gynecology | Admitting: Gynecology

## 2013-03-26 VITALS — BP 120/74 | Ht 62.5 in | Wt 166.0 lb

## 2013-03-26 DIAGNOSIS — N952 Postmenopausal atrophic vaginitis: Secondary | ICD-10-CM

## 2013-03-26 DIAGNOSIS — Z1272 Encounter for screening for malignant neoplasm of vagina: Secondary | ICD-10-CM | POA: Diagnosis not present

## 2013-03-26 DIAGNOSIS — Z8542 Personal history of malignant neoplasm of other parts of uterus: Secondary | ICD-10-CM | POA: Diagnosis not present

## 2013-03-26 DIAGNOSIS — M899 Disorder of bone, unspecified: Secondary | ICD-10-CM | POA: Diagnosis not present

## 2013-03-26 DIAGNOSIS — M949 Disorder of cartilage, unspecified: Secondary | ICD-10-CM

## 2013-03-26 DIAGNOSIS — Z1151 Encounter for screening for human papillomavirus (HPV): Secondary | ICD-10-CM | POA: Insufficient documentation

## 2013-03-26 DIAGNOSIS — Z124 Encounter for screening for malignant neoplasm of cervix: Secondary | ICD-10-CM | POA: Insufficient documentation

## 2013-03-26 DIAGNOSIS — M8580 Other specified disorders of bone density and structure, unspecified site: Secondary | ICD-10-CM

## 2013-03-26 MED ORDER — ESTRADIOL 0.05 MG/24HR TD PTTW: 1.0000 | MEDICATED_PATCH | TRANSDERMAL | Status: DC

## 2013-03-26 NOTE — Addendum Note (Signed)
Addended by: Su Grand A on: 03/26/2013 03:13 PM   Modules accepted: Orders

## 2013-03-26 NOTE — Progress Notes (Signed)
Carly [name] December 09, 1940 [id number]   History:    73 y.o.  for GYN followup. Patient is a new patient to the practice. She was previously been followed by Dr. Ubaldo Glassing. Her records were reviewed as follows:  1981 at time of abdominal hysterectomy for fibroid uterus incidental findings of adenocarcinoma was noted. Patient stated few days later she was reoperated on to remove both her tubes and ovary. 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  1992 patient had enterocele and rectocele repair grade 3  Patient has been followed by Dr. dates who has been doing her blood work. Patient states that her flu vaccine, shingles pexied, Pneumovax and Tdap are up-to-date. She has always had normal Pap smears. Last colonoscopy 2007 reported to be normal. Patient recently had a mammogram with dense breast noted but normal with a 3V scan are.  Patient has been taking half of a Vivelle-Dot 0.05 which she applies twice a week. Bone density study done in 2010 with evidence of osteopenia lowest T score left femoral neck -1.5.   Past medical history,surgical history, family history and social history were all reviewed and documented in the EPIC chart.  Gynecologic History No LMP recorded. Patient is postmenopausal. Contraception: status post hysterectomy Last Pap: 2014. Results were: normal Last mammogram: 2015. Results were: normal  Obstetric History OB History  Gravida Para Term Preterm AB SAB TAB Ectopic Multiple Living  2 2        2     # Outcome Date GA Lbr Len/2nd Weight Sex Delivery Anes PTL Lv  2 PAR           1 PAR                ROS: A ROS was performed and pertinent positives and negatives are included in the history.  GENERAL: No fevers or chills. HEENT: No change in vision, no earache, sore throat or sinus congestion. NECK: No pain or stiffness. CARDIOVASCULAR: No chest pain or pressure. No palpitations.  PULMONARY: No shortness of breath, cough or wheeze. GASTROINTESTINAL: No abdominal pain, nausea, vomiting or diarrhea, melena or bright red blood per rectum. GENITOURINARY: No urinary frequency, urgency, hesitancy or dysuria. MUSCULOSKELETAL: No joint or muscle pain, no back pain, no recent trauma. DERMATOLOGIC: No rash, no itching, no lesions. ENDOCRINE: No polyuria, polydipsia, no heat or cold intolerance. No recent change in weight. HEMATOLOGICAL: No anemia or easy bruising or bleeding. NEUROLOGIC: No headache, seizures, numbness, tingling or weakness. PSYCHIATRIC: No depression, no loss of interest in normal activity or change in sleep pattern.     Exam: chaperone present  BP 120/74  Ht 5' 2.5" (1.588 m)  Wt 166 lb (75.297 kg)  BMI 29.86 kg/m2  Body mass index is 29.86 kg/(m^2).  General appearance : Well developed well nourished female. No acute distress HEENT: Neck supple, trachea midline, no carotid bruits, no thyroidmegaly Lungs: Clear to auscultation, no rhonchi or wheezes, or rib retractions  Heart: Regular rate and rhythm, no murmurs or gallops Breast:Examined in sitting and supine position were symmetrical in appearance, no palpable masses or tenderness,  no skin retraction, no nipple inversion, no nipple discharge, no skin discoloration, no axillary or supraclavicular lymphadenopathy Abdomen: no palpable masses or tenderness, no rebound or guarding Extremities: no edema or skin discoloration or tenderness  Pelvic:  Bartholin, Urethra, Skene Glands: Within normal limits  Vagina: No gross lesions or discharge, Vaginal atrophy  Cervix:  absent   Uterus  absent  Adnexa  Without masses or tenderness  Anus and perineum  normal   Rectovaginal  normal sphincter tone without palpated masses or tenderness             Hemoccult PCP provides     Assessment/Plan:  73 y.o. 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 Pap smear was done today. Patient was counseled on her long term usage of transdermal low-dose estrogen. We did discuss the women's health initiative study. She is taking half of a 0.05 mg patch twice a week. We discussed importance of calcium and vitamin D and regular exercise for osteoporosis prevention. She will schedule for bone density study. She was reminded to do her monthly breast exam.  Note: This dictation was prepared with  Dragon/digital dictation along withSmart phrase technology. Any transcriptional errors that result from this process are unintentional.   Terrance Mass MD, 2:47 PM 03/26/2013

## 2013-03-28 ENCOUNTER — Other Ambulatory Visit: Payer: Self-pay

## 2013-03-28 MED ORDER — ESTRADIOL 0.05 MG/24HR TD PTTW: 1.0000 | MEDICATED_PATCH | TRANSDERMAL | Status: DC

## 2013-03-31 ENCOUNTER — Other Ambulatory Visit: Payer: Self-pay | Admitting: Gynecology

## 2013-03-31 MED ORDER — ESTRADIOL 0.05 MG/24HR TD PTTW: 1.0000 | MEDICATED_PATCH | TRANSDERMAL | Status: DC

## 2013-04-01 ENCOUNTER — Other Ambulatory Visit: Payer: Self-pay | Admitting: Gynecology

## 2013-04-01 MED ORDER — FLUCONAZOLE 150 MG PO TABS: 150.0000 mg | ORAL_TABLET | Freq: Once | ORAL | Status: DC

## 2013-04-08 DIAGNOSIS — L089 Local infection of the skin and subcutaneous tissue, unspecified: Secondary | ICD-10-CM | POA: Diagnosis not present

## 2013-04-08 DIAGNOSIS — IMO0002 Reserved for concepts with insufficient information to code with codable children: Secondary | ICD-10-CM | POA: Diagnosis not present

## 2013-04-30 ENCOUNTER — Encounter: Payer: Self-pay | Admitting: Gynecology

## 2013-04-30 ENCOUNTER — Telehealth: Payer: Self-pay | Admitting: *Deleted

## 2013-04-30 MED ORDER — TRAZODONE HCL 100 MG PO TABS: ORAL_TABLET | ORAL | Status: DC

## 2013-04-30 NOTE — Telephone Encounter (Signed)
Left on voicemail rx sent. 

## 2013-04-30 NOTE — Telephone Encounter (Signed)
Pt was seen for annual on 03/26/13 former patient of Dr.Lomax pt said she was taking trazodone 100 mg with Dr.Lomax asked if you would give Rx as well. Please advise

## 2013-04-30 NOTE — Telephone Encounter (Signed)
PLease call in 30 tablets with 2 refills to take one PO HS PRN insomnia

## 2013-05-16 DIAGNOSIS — S298XXA Other specified injuries of thorax, initial encounter: Secondary | ICD-10-CM | POA: Diagnosis not present

## 2013-05-16 DIAGNOSIS — S0990XA Unspecified injury of head, initial encounter: Secondary | ICD-10-CM | POA: Diagnosis not present

## 2013-05-16 DIAGNOSIS — S022XXA Fracture of nasal bones, initial encounter for closed fracture: Secondary | ICD-10-CM | POA: Diagnosis not present

## 2013-05-16 DIAGNOSIS — Z79899 Other long term (current) drug therapy: Secondary | ICD-10-CM | POA: Diagnosis not present

## 2013-05-16 DIAGNOSIS — Z888 Allergy status to other drugs, medicaments and biological substances status: Secondary | ICD-10-CM | POA: Diagnosis not present

## 2013-05-16 DIAGNOSIS — S0003XA Contusion of scalp, initial encounter: Secondary | ICD-10-CM | POA: Diagnosis not present

## 2013-05-16 DIAGNOSIS — S20219A Contusion of unspecified front wall of thorax, initial encounter: Secondary | ICD-10-CM | POA: Diagnosis not present

## 2013-05-16 DIAGNOSIS — E785 Hyperlipidemia, unspecified: Secondary | ICD-10-CM | POA: Diagnosis not present

## 2013-05-16 DIAGNOSIS — S1093XA Contusion of unspecified part of neck, initial encounter: Secondary | ICD-10-CM | POA: Diagnosis not present

## 2013-06-02 DIAGNOSIS — K529 Noninfective gastroenteritis and colitis, unspecified: Secondary | ICD-10-CM | POA: Diagnosis not present

## 2013-06-02 DIAGNOSIS — K219 Gastro-esophageal reflux disease without esophagitis: Secondary | ICD-10-CM | POA: Diagnosis not present

## 2013-06-05 DIAGNOSIS — F411 Generalized anxiety disorder: Secondary | ICD-10-CM | POA: Diagnosis not present

## 2013-06-05 DIAGNOSIS — E039 Hypothyroidism, unspecified: Secondary | ICD-10-CM | POA: Diagnosis not present

## 2013-06-05 DIAGNOSIS — K219 Gastro-esophageal reflux disease without esophagitis: Secondary | ICD-10-CM | POA: Diagnosis not present

## 2013-06-05 DIAGNOSIS — E7800 Pure hypercholesterolemia, unspecified: Secondary | ICD-10-CM | POA: Diagnosis not present

## 2013-08-18 DIAGNOSIS — M1710 Unilateral primary osteoarthritis, unspecified knee: Secondary | ICD-10-CM | POA: Diagnosis not present

## 2013-08-26 ENCOUNTER — Other Ambulatory Visit: Payer: Self-pay | Admitting: Gynecology

## 2013-08-26 DIAGNOSIS — M1710 Unilateral primary osteoarthritis, unspecified knee: Secondary | ICD-10-CM | POA: Diagnosis not present

## 2013-08-26 DIAGNOSIS — H4011X0 Primary open-angle glaucoma, stage unspecified: Secondary | ICD-10-CM | POA: Diagnosis not present

## 2013-08-26 DIAGNOSIS — H409 Unspecified glaucoma: Secondary | ICD-10-CM | POA: Diagnosis not present

## 2013-08-27 NOTE — Telephone Encounter (Signed)
Called into pharmacy

## 2013-09-01 DIAGNOSIS — M25569 Pain in unspecified knee: Secondary | ICD-10-CM | POA: Diagnosis not present

## 2013-09-01 DIAGNOSIS — M1710 Unilateral primary osteoarthritis, unspecified knee: Secondary | ICD-10-CM | POA: Diagnosis not present

## 2013-11-05 DIAGNOSIS — K5730 Diverticulosis of large intestine without perforation or abscess without bleeding: Secondary | ICD-10-CM | POA: Diagnosis not present

## 2013-11-05 DIAGNOSIS — K529 Noninfective gastroenteritis and colitis, unspecified: Secondary | ICD-10-CM | POA: Diagnosis not present

## 2013-11-05 DIAGNOSIS — K921 Melena: Secondary | ICD-10-CM | POA: Diagnosis not present

## 2013-12-15 DIAGNOSIS — Z23 Encounter for immunization: Secondary | ICD-10-CM | POA: Diagnosis not present

## 2013-12-15 DIAGNOSIS — M8580 Other specified disorders of bone density and structure, unspecified site: Secondary | ICD-10-CM | POA: Diagnosis not present

## 2013-12-15 DIAGNOSIS — Z0000 Encounter for general adult medical examination without abnormal findings: Secondary | ICD-10-CM | POA: Diagnosis not present

## 2013-12-15 DIAGNOSIS — E780 Pure hypercholesterolemia: Secondary | ICD-10-CM | POA: Diagnosis not present

## 2013-12-15 DIAGNOSIS — Z1389 Encounter for screening for other disorder: Secondary | ICD-10-CM | POA: Diagnosis not present

## 2013-12-15 DIAGNOSIS — E039 Hypothyroidism, unspecified: Secondary | ICD-10-CM | POA: Diagnosis not present

## 2013-12-15 DIAGNOSIS — Z79899 Other long term (current) drug therapy: Secondary | ICD-10-CM | POA: Diagnosis not present

## 2013-12-15 DIAGNOSIS — F419 Anxiety disorder, unspecified: Secondary | ICD-10-CM | POA: Diagnosis not present

## 2013-12-15 DIAGNOSIS — R109 Unspecified abdominal pain: Secondary | ICD-10-CM | POA: Diagnosis not present

## 2013-12-29 ENCOUNTER — Encounter: Payer: Self-pay | Admitting: Gynecology

## 2014-01-27 DIAGNOSIS — E780 Pure hypercholesterolemia: Secondary | ICD-10-CM | POA: Diagnosis not present

## 2014-02-10 ENCOUNTER — Other Ambulatory Visit: Payer: Self-pay

## 2014-02-10 DIAGNOSIS — Z1231 Encounter for screening mammogram for malignant neoplasm of breast: Secondary | ICD-10-CM

## 2014-03-09 DIAGNOSIS — H52203 Unspecified astigmatism, bilateral: Secondary | ICD-10-CM | POA: Diagnosis not present

## 2014-03-09 DIAGNOSIS — H4011X2 Primary open-angle glaucoma, moderate stage: Secondary | ICD-10-CM | POA: Diagnosis not present

## 2014-03-18 ENCOUNTER — Ambulatory Visit
Admission: RE | Admit: 2014-03-18 | Discharge: 2014-03-18 | Disposition: A | Discharge status: Home / Self Care | Payer: Medicare Other | Source: Ambulatory Visit

## 2014-03-18 DIAGNOSIS — Z1231 Encounter for screening mammogram for malignant neoplasm of breast: Secondary | ICD-10-CM

## 2014-03-27 ENCOUNTER — Other Ambulatory Visit (HOSPITAL_COMMUNITY)
Admission: RE | Admit: 2014-03-27 | Discharge: 2014-03-27 | Disposition: A | Discharge status: Home / Self Care | Payer: Medicare Other | Source: Ambulatory Visit | Attending: Gynecology | Admitting: Gynecology

## 2014-03-27 ENCOUNTER — Ambulatory Visit (INDEPENDENT_AMBULATORY_CARE_PROVIDER_SITE_OTHER): Payer: Medicare Other | Admitting: Gynecology

## 2014-03-27 ENCOUNTER — Encounter: Payer: Self-pay | Admitting: Gynecology

## 2014-03-27 VITALS — BP 132/84 | Ht 62.5 in | Wt 164.8 lb

## 2014-03-27 DIAGNOSIS — M8580 Other specified disorders of bone density and structure, unspecified site: Secondary | ICD-10-CM

## 2014-03-27 DIAGNOSIS — N952 Postmenopausal atrophic vaginitis: Secondary | ICD-10-CM | POA: Diagnosis not present

## 2014-03-27 DIAGNOSIS — Z8542 Personal history of malignant neoplasm of other parts of uterus: Secondary | ICD-10-CM | POA: Diagnosis not present

## 2014-03-27 DIAGNOSIS — Z01411 Encounter for gynecological examination (general) (routine) with abnormal findings: Secondary | ICD-10-CM | POA: Diagnosis not present

## 2014-03-27 DIAGNOSIS — Z01419 Encounter for gynecological examination (general) (routine) without abnormal findings: Secondary | ICD-10-CM

## 2014-03-27 MED ORDER — TRAZODONE HCL 100 MG PO TABS: 100.0000 mg | ORAL_TABLET | Freq: Every day | ORAL | Status: DC

## 2014-03-27 NOTE — Progress Notes (Signed)
Carly [name] 1940-07-02 [id number]   History:    74 y.o.  for annual gyn exam with no complaints today. Patient was seen last year as a new patient to the practice she was previously being followed by Dr. Ubaldo Glassing. Review of her record as follows:  1981 at time of abdominal hysterectomy for fibroid uterus incidental findings of adenocarcinoma was noted. Patient stated few days later she was reoperated on to remove both her tubes and ovary. 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  1992 patient had enterocele and rectocele repair grade 3  Her follow-up Pap smears have been normal. Dr. Inda Merlin is her PCP who is been doing her blood work. All her vaccines are up-to-date. Her colonoscopy was in 2007. Patient states she does have history of polyps. She had been followed by Dr. Watt Climes. Patient's last bone density study in 2014 demonstrated the lowest T score was at the left femoral neck -1.1.Patient has been taking half of a Vivelle-Dot 0.05 which she applies twice a week. For many years.  Past medical history,surgical history, family history and social history were all reviewed and documented in the EPIC chart.  Gynecologic History No LMP recorded. Patient is postmenopausal. Contraception: status post hysterectomy Last Pap: 2015. Results were: normal Last mammogram: 2016. Results were: Normal but dense  Obstetric History OB History  Gravida Para Term Preterm AB SAB TAB Ectopic Multiple Living  2 2        2     # Outcome Date GA Lbr Len/2nd Weight Sex Delivery Anes PTL Lv  2 Para           1 Para                ROS: A ROS was performed and pertinent positives and negatives are included in the history.  GENERAL: No fevers or chills. HEENT: No change in vision, no earache, sore throat or sinus congestion. NECK: No pain or stiffness. CARDIOVASCULAR: No chest pain or pressure. No palpitations. PULMONARY: No  shortness of breath, cough or wheeze. GASTROINTESTINAL: No abdominal pain, nausea, vomiting or diarrhea, melena or bright red blood per rectum. GENITOURINARY: No urinary frequency, urgency, hesitancy or dysuria. MUSCULOSKELETAL: No joint or muscle pain, no back pain, no recent trauma. DERMATOLOGIC: No rash, no itching, no lesions. ENDOCRINE: No polyuria, polydipsia, no heat or cold intolerance. No recent change in weight. HEMATOLOGICAL: No anemia or easy bruising or bleeding. NEUROLOGIC: No headache, seizures, numbness, tingling or weakness. PSYCHIATRIC: No depression, no loss of interest in normal activity or change in sleep pattern.     Exam: chaperone present  BP 132/84 mmHg  Ht 5' 2.5" (1.588 m)  Wt 164 lb 12.8 oz (74.753 kg)  BMI 29.64 kg/m2  Body mass index is 29.64 kg/(m^2).  General appearance : Well developed well nourished female. No acute distress HEENT: Neck supple, trachea midline, no carotid bruits, no thyroidmegaly Lungs: Clear to auscultation, no rhonchi or wheezes, or rib retractions  Heart: Regular rate and rhythm, no murmurs or gallops Breast:Examined in sitting and supine position were symmetrical in appearance, no palpable masses or tenderness,  no skin retraction, no nipple inversion, no nipple discharge, no skin discoloration, no axillary or supraclavicular lymphadenopathy Abdomen: no palpable masses or tenderness, no rebound or guarding Extremities: no edema or skin discoloration or tenderness  Pelvic:  Bartholin, Urethra, Skene Glands: Within normal limits  Vagina: No gross lesions or discharge, vaginal atrophy  Cervix: Absent, adnexa absent Uterus  Absent  Anus and perineum  normal   Rectovaginal  normal sphincter tone without palpated masses or tenderness             Hemoccult PCP provides     Assessment/Plan:  74 y.o. female for annual exam who is been on transdermal estrogen for many years. We once again discussed the women's health initiative  study. She is going to taper off. She uses lubricant when necessary during intercourse although infrequent. We did do a Pap smear because of her past history of endometrial cancer. Blood work be drawn by her PCP. She will need a bone density study later this year. We discussed importance of calcium vitamin D and active lifestyle. All her immunizations are up-to-date.   Terrance Mass MD, 10:51 AM 03/27/2014

## 2014-03-27 NOTE — Addendum Note (Signed)
Addended by: Thurnell Garbe A on: 03/27/2014 11:06 AM   Modules accepted: Orders, SmartSet

## 2014-03-30 LAB — CYTOLOGY - PAP

## 2014-06-17 DIAGNOSIS — E780 Pure hypercholesterolemia: Secondary | ICD-10-CM | POA: Diagnosis not present

## 2014-06-17 DIAGNOSIS — E039 Hypothyroidism, unspecified: Secondary | ICD-10-CM | POA: Diagnosis not present

## 2014-06-17 DIAGNOSIS — Z5181 Encounter for therapeutic drug level monitoring: Secondary | ICD-10-CM | POA: Diagnosis not present

## 2014-06-17 DIAGNOSIS — R109 Unspecified abdominal pain: Secondary | ICD-10-CM | POA: Diagnosis not present

## 2014-06-17 DIAGNOSIS — F419 Anxiety disorder, unspecified: Secondary | ICD-10-CM | POA: Diagnosis not present

## 2014-06-23 ENCOUNTER — Other Ambulatory Visit: Payer: Self-pay | Admitting: Physician Assistant

## 2014-06-23 DIAGNOSIS — L821 Other seborrheic keratosis: Secondary | ICD-10-CM | POA: Diagnosis not present

## 2014-06-23 DIAGNOSIS — D239 Other benign neoplasm of skin, unspecified: Secondary | ICD-10-CM | POA: Diagnosis not present

## 2014-06-23 DIAGNOSIS — D485 Neoplasm of uncertain behavior of skin: Secondary | ICD-10-CM | POA: Diagnosis not present

## 2014-06-23 DIAGNOSIS — D225 Melanocytic nevi of trunk: Secondary | ICD-10-CM | POA: Diagnosis not present

## 2014-07-21 DIAGNOSIS — M79662 Pain in left lower leg: Secondary | ICD-10-CM | POA: Diagnosis not present

## 2014-09-02 DIAGNOSIS — M1711 Unilateral primary osteoarthritis, right knee: Secondary | ICD-10-CM | POA: Diagnosis not present

## 2014-09-02 DIAGNOSIS — M170 Bilateral primary osteoarthritis of knee: Secondary | ICD-10-CM | POA: Diagnosis not present

## 2014-09-02 DIAGNOSIS — M1712 Unilateral primary osteoarthritis, left knee: Secondary | ICD-10-CM | POA: Diagnosis not present

## 2014-09-09 DIAGNOSIS — M170 Bilateral primary osteoarthritis of knee: Secondary | ICD-10-CM | POA: Diagnosis not present

## 2014-09-09 DIAGNOSIS — M1712 Unilateral primary osteoarthritis, left knee: Secondary | ICD-10-CM | POA: Diagnosis not present

## 2014-09-09 DIAGNOSIS — M1711 Unilateral primary osteoarthritis, right knee: Secondary | ICD-10-CM | POA: Diagnosis not present

## 2014-09-16 DIAGNOSIS — M1711 Unilateral primary osteoarthritis, right knee: Secondary | ICD-10-CM | POA: Diagnosis not present

## 2014-09-16 DIAGNOSIS — M170 Bilateral primary osteoarthritis of knee: Secondary | ICD-10-CM | POA: Diagnosis not present

## 2014-09-16 DIAGNOSIS — M1712 Unilateral primary osteoarthritis, left knee: Secondary | ICD-10-CM | POA: Diagnosis not present

## 2014-09-21 DIAGNOSIS — H2513 Age-related nuclear cataract, bilateral: Secondary | ICD-10-CM | POA: Diagnosis not present

## 2014-09-21 DIAGNOSIS — H4011X2 Primary open-angle glaucoma, moderate stage: Secondary | ICD-10-CM | POA: Diagnosis not present

## 2014-12-23 DIAGNOSIS — Z0000 Encounter for general adult medical examination without abnormal findings: Secondary | ICD-10-CM | POA: Diagnosis not present

## 2014-12-23 DIAGNOSIS — F419 Anxiety disorder, unspecified: Secondary | ICD-10-CM | POA: Diagnosis not present

## 2014-12-23 DIAGNOSIS — H6123 Impacted cerumen, bilateral: Secondary | ICD-10-CM | POA: Diagnosis not present

## 2014-12-23 DIAGNOSIS — R7301 Impaired fasting glucose: Secondary | ICD-10-CM | POA: Diagnosis not present

## 2014-12-23 DIAGNOSIS — Z23 Encounter for immunization: Secondary | ICD-10-CM | POA: Diagnosis not present

## 2014-12-23 DIAGNOSIS — K219 Gastro-esophageal reflux disease without esophagitis: Secondary | ICD-10-CM | POA: Diagnosis not present

## 2014-12-23 DIAGNOSIS — E7800 Pure hypercholesterolemia, unspecified: Secondary | ICD-10-CM | POA: Diagnosis not present

## 2014-12-23 DIAGNOSIS — Z5181 Encounter for therapeutic drug level monitoring: Secondary | ICD-10-CM | POA: Diagnosis not present

## 2014-12-23 DIAGNOSIS — E039 Hypothyroidism, unspecified: Secondary | ICD-10-CM | POA: Diagnosis not present

## 2015-02-01 ENCOUNTER — Other Ambulatory Visit: Payer: Self-pay

## 2015-02-01 DIAGNOSIS — Z1231 Encounter for screening mammogram for malignant neoplasm of breast: Secondary | ICD-10-CM

## 2015-02-05 ENCOUNTER — Encounter: Payer: Self-pay | Admitting: Internal Medicine

## 2015-03-22 ENCOUNTER — Ambulatory Visit
Admission: RE | Admit: 2015-03-22 | Discharge: 2015-03-22 | Disposition: A | Discharge status: Home / Self Care | Payer: Medicare Other | Source: Ambulatory Visit

## 2015-03-22 DIAGNOSIS — Z1231 Encounter for screening mammogram for malignant neoplasm of breast: Secondary | ICD-10-CM | POA: Diagnosis not present

## 2015-03-29 ENCOUNTER — Other Ambulatory Visit (HOSPITAL_COMMUNITY)
Admission: RE | Admit: 2015-03-29 | Discharge: 2015-03-29 | Disposition: A | Discharge status: Home / Self Care | Payer: Medicare Other | Source: Ambulatory Visit | Attending: Gynecology | Admitting: Gynecology

## 2015-03-29 ENCOUNTER — Encounter: Payer: Self-pay | Admitting: Gynecology

## 2015-03-29 ENCOUNTER — Telehealth: Payer: Self-pay | Admitting: *Deleted

## 2015-03-29 ENCOUNTER — Ambulatory Visit (INDEPENDENT_AMBULATORY_CARE_PROVIDER_SITE_OTHER): Payer: Medicare Other | Admitting: Gynecology

## 2015-03-29 ENCOUNTER — Other Ambulatory Visit: Payer: Self-pay | Admitting: Gynecology

## 2015-03-29 VITALS — BP 140/92 | Ht 62.25 in | Wt 162.8 lb

## 2015-03-29 DIAGNOSIS — F4322 Adjustment disorder with anxiety: Secondary | ICD-10-CM | POA: Diagnosis not present

## 2015-03-29 DIAGNOSIS — Z01411 Encounter for gynecological examination (general) (routine) with abnormal findings: Secondary | ICD-10-CM | POA: Diagnosis not present

## 2015-03-29 DIAGNOSIS — Z8542 Personal history of malignant neoplasm of other parts of uterus: Secondary | ICD-10-CM

## 2015-03-29 DIAGNOSIS — M8580 Other specified disorders of bone density and structure, unspecified site: Secondary | ICD-10-CM | POA: Diagnosis not present

## 2015-03-29 DIAGNOSIS — Z1151 Encounter for screening for human papillomavirus (HPV): Secondary | ICD-10-CM | POA: Diagnosis not present

## 2015-03-29 DIAGNOSIS — G4700 Insomnia, unspecified: Secondary | ICD-10-CM

## 2015-03-29 DIAGNOSIS — E2839 Other primary ovarian failure: Secondary | ICD-10-CM

## 2015-03-29 DIAGNOSIS — Z01419 Encounter for gynecological examination (general) (routine) without abnormal findings: Secondary | ICD-10-CM | POA: Diagnosis not present

## 2015-03-29 DIAGNOSIS — Z780 Asymptomatic menopausal state: Secondary | ICD-10-CM

## 2015-03-29 MED ORDER — TRAZODONE HCL 100 MG PO TABS: 100.0000 mg | ORAL_TABLET | Freq: Every day | ORAL | Status: DC

## 2015-03-29 NOTE — Progress Notes (Signed)
Carly [name] [birth year] [id number]   History:    75 y.o.  for annual gyn exam with no complaints today. Patient was seen for the first time as a new patient in 2015. Review of her record and indicated the following:  1981 at time of abdominal hysterectomy for fibroid uterus incidental findings of adenocarcinoma was noted. Patient stated few days later she was reoperated on to remove both her tubes and ovary. 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  1992 patient had enterocele and rectocele repair grade 3  Her follow-up Pap smears have been normal. Dr. Inda Merlin is her PCP who is been doing her blood work. All her vaccines are up-to-date. Her colonoscopy was in 2007. Patient states she does have history of polyps. She had been followed by Dr. Watt Climes. Patient's last bone density study in 2014 demonstrated the lowest T score was at the left femoral neck -1.1  Patient had been on estrogen replacement therapy consisting of estrogen patch for many years and it was discontinued last year she occasional have a hot flash before bedtime but otherwise is been doing well.  Pap smear 2012, 2014, 2015 in 2016 have been normal.  Past medical history,surgical history, family history and social history were all reviewed and documented in the EPIC chart.  Gynecologic History No LMP recorded. Patient is postmenopausal. Contraception: post menopausal status Last Pap: 2016. Results were: normal Last mammogram: 2017. Results were: Normal but dense  Obstetric History OB History  Gravida Para Term Preterm AB SAB TAB Ectopic Multiple Living  2 2        2     # Outcome Date GA Lbr Len/2nd Weight Sex Delivery Anes PTL Lv  2 Para           1 Para                ROS: A ROS was performed and pertinent positives and negatives are included in the history.  GENERAL: No fevers or chills. HEENT: No change in vision, no earache, sore  throat or sinus congestion. NECK: No pain or stiffness. CARDIOVASCULAR: No chest pain or pressure. No palpitations. PULMONARY: No shortness of breath, cough or wheeze. GASTROINTESTINAL: No abdominal pain, nausea, vomiting or diarrhea, melena or bright red blood per rectum. GENITOURINARY: No urinary frequency, urgency, hesitancy or dysuria. MUSCULOSKELETAL: No joint or muscle pain, no back pain, no recent trauma. DERMATOLOGIC: No rash, no itching, no lesions. ENDOCRINE: No polyuria, polydipsia, no heat or cold intolerance. No recent change in weight. HEMATOLOGICAL: No anemia or easy bruising or bleeding. NEUROLOGIC: No headache, seizures, numbness, tingling or weakness. PSYCHIATRIC: No depression, no loss of interest in normal activity or change in sleep pattern.     Exam: chaperone present  BP 140/92 mmHg  Ht 5' 2.25" (1.581 m)  Wt 162 lb 12.8 oz (73.846 kg)  BMI 29.54 kg/m2  Body mass index is 29.54 kg/(m^2).  General appearance : Well developed well nourished female. No acute distress HEENT: Eyes: no retinal hemorrhage or exudates,  Neck supple, trachea midline, no carotid bruits, no thyroidmegaly Lungs: Clear to auscultation, no rhonchi or wheezes, or rib retractions  Heart: Regular rate and rhythm, no murmurs or gallops Breast:Examined in sitting and supine position were symmetrical in appearance, no palpable masses or tenderness,  no skin retraction, no nipple inversion, no nipple discharge, no skin discoloration, no axillary or supraclavicular lymphadenopathy Abdomen: no  palpable masses or tenderness, no rebound or guarding Extremities: no edema or skin discoloration or tenderness  Pelvic:  Bartholin, Urethra, Skene Glands: Within normal limits             Vagina: No gross lesions or discharge, atrophic changes  Cervix: Absent  Uterus absent  Adnexa  Without masses or tenderness  Anus and perineum  normal   Rectovaginal  normal sphincter tone without palpated masses or tenderness              Hemoccult PCP provides     Assessment/Plan:  75 y.o. female for annual exam will have her Pap smear today due to her past history of uterine cancer. PCP has been doing her blood work. All her vaccines are up-to-date. Patient to schedule her bone density study this year. For her very infrequent hot flashes she can buy over-the-counter at the health food store peppermint oral that she can apply a dab or 2 behind each year when necessary. For patient's anxiety she wanted to a prescription refill and to help her sleep trazodone 100 mg daily at bedtime when necessary.   Terrance Mass MD, 10:13 AM 03/29/2015

## 2015-03-29 NOTE — Patient Instructions (Signed)

## 2015-03-29 NOTE — Telephone Encounter (Signed)
Pt called requesting order for bone density placed at [facility],order placed pt will call and schedule.

## 2015-03-30 LAB — CYTOLOGY - PAP

## 2015-04-22 ENCOUNTER — Ambulatory Visit
Admission: RE | Admit: 2015-04-22 | Discharge: 2015-04-22 | Disposition: A | Discharge status: Home / Self Care | Payer: Medicare Other | Source: Ambulatory Visit | Attending: Gynecology | Admitting: Gynecology

## 2015-04-22 DIAGNOSIS — E2839 Other primary ovarian failure: Secondary | ICD-10-CM

## 2015-04-22 DIAGNOSIS — M8588 Other specified disorders of bone density and structure, other site: Secondary | ICD-10-CM | POA: Diagnosis not present

## 2015-04-27 DIAGNOSIS — H2513 Age-related nuclear cataract, bilateral: Secondary | ICD-10-CM | POA: Diagnosis not present

## 2015-04-27 DIAGNOSIS — H401112 Primary open-angle glaucoma, right eye, moderate stage: Secondary | ICD-10-CM | POA: Diagnosis not present

## 2015-04-27 DIAGNOSIS — H401122 Primary open-angle glaucoma, left eye, moderate stage: Secondary | ICD-10-CM | POA: Diagnosis not present

## 2015-04-27 DIAGNOSIS — H5213 Myopia, bilateral: Secondary | ICD-10-CM | POA: Diagnosis not present

## 2015-06-28 DIAGNOSIS — K219 Gastro-esophageal reflux disease without esophagitis: Secondary | ICD-10-CM | POA: Diagnosis not present

## 2015-06-28 DIAGNOSIS — E039 Hypothyroidism, unspecified: Secondary | ICD-10-CM | POA: Diagnosis not present

## 2015-06-28 DIAGNOSIS — F419 Anxiety disorder, unspecified: Secondary | ICD-10-CM | POA: Diagnosis not present

## 2015-06-28 DIAGNOSIS — E7800 Pure hypercholesterolemia, unspecified: Secondary | ICD-10-CM | POA: Diagnosis not present

## 2015-06-28 DIAGNOSIS — Z5181 Encounter for therapeutic drug level monitoring: Secondary | ICD-10-CM | POA: Diagnosis not present

## 2015-07-29 ENCOUNTER — Ambulatory Visit (INDEPENDENT_AMBULATORY_CARE_PROVIDER_SITE_OTHER): Payer: Medicare Other | Admitting: Gynecology

## 2015-07-29 ENCOUNTER — Encounter: Payer: Self-pay | Admitting: Gynecology

## 2015-07-29 VITALS — BP 126/80 | Ht 62.0 in | Wt 162.0 lb

## 2015-07-29 DIAGNOSIS — R300 Dysuria: Secondary | ICD-10-CM | POA: Diagnosis not present

## 2015-07-29 DIAGNOSIS — N3000 Acute cystitis without hematuria: Secondary | ICD-10-CM | POA: Diagnosis not present

## 2015-07-29 DIAGNOSIS — L292 Pruritus vulvae: Secondary | ICD-10-CM

## 2015-07-29 DIAGNOSIS — N898 Other specified noninflammatory disorders of vagina: Secondary | ICD-10-CM

## 2015-07-29 DIAGNOSIS — L293 Anogenital pruritus, unspecified: Secondary | ICD-10-CM | POA: Diagnosis not present

## 2015-07-29 LAB — URINALYSIS W MICROSCOPIC + REFLEX CULTURE
Bilirubin Urine: NEGATIVE
Casts: NONE SEEN [LPF]
Crystals: NONE SEEN [HPF]
Glucose, UA: NEGATIVE
Ketones, ur: NEGATIVE
Nitrite: NEGATIVE
Protein, ur: NEGATIVE
Specific Gravity, Urine: 1.01 (ref 1.001–1.035)
WBC, UA: >60 WBC/HPF — AB
Yeast: NONE SEEN [HPF]
pH: 6.5 (ref 5.0–8.0)

## 2015-07-29 LAB — WET PREP FOR TRICH, YEAST, CLUE
Clue Cells Wet Prep HPF POC: NONE SEEN
Trich, Wet Prep: NONE SEEN
WBC, Wet Prep HPF POC: NONE SEEN
Yeast Wet Prep HPF POC: NONE SEEN

## 2015-07-29 MED ORDER — NITROFURANTOIN MONOHYD MACRO 100 MG PO CAPS: 100.0000 mg | ORAL_CAPSULE | Freq: Two times a day (BID) | ORAL | Status: DC

## 2015-07-29 MED ORDER — PHENAZOPYRIDINE HCL 200 MG PO TABS: 200.0000 mg | ORAL_TABLET | Freq: Three times a day (TID) | ORAL | Status: DC | PRN

## 2015-07-29 MED ORDER — FLUCONAZOLE 150 MG PO TABS: 150.0000 mg | ORAL_TABLET | Freq: Once | ORAL | Status: DC

## 2015-07-29 NOTE — Patient Instructions (Signed)
Fluconazole tablets What is this medicine? FLUCONAZOLE (floo KON na zole) is an antifungal medicine. It is used to treat certain kinds of fungal or yeast infections. This medicine may be used for other purposes; ask your health care provider or pharmacist if you have questions. What should I tell my health care provider before I take this medicine? They need to know if you have any of these conditions: -electrolyte abnormalities -history of irregular heart beat -kidney disease -an unusual or allergic reaction to fluconazole, other azole antifungals, medicines, foods, dyes, or preservatives -pregnant or trying to get pregnant -breast-feeding How should I use this medicine? Take this medicine by mouth. Follow the directions on the prescription label. Do not take your medicine more often than directed. Talk to your pediatrician regarding the use of this medicine in children. Special care may be needed. This medicine has been used in children as young as 58 months of age. Overdosage: If you think you have taken too much of this medicine contact a poison control center or emergency room at once. NOTE: This medicine is only for you. Do not share this medicine with others. What if I miss a dose? If you miss a dose, take it as soon as you can. If it is almost time for your next dose, take only that dose. Do not take double or extra doses. What may interact with this medicine? Do not take this medicine with any of the following medications: -astemizole -certain medicines for irregular heart beat like dofetilide, dronedarone, quinidine -cisapride -erythromycin -lomitapide -other medicines that prolong the QT interval (cause an abnormal heart rhythm) -pimozide -terfenadine -thioridazine -tolvaptan -ziprasidone This medicine may also interact with the following medications: -antiviral medicines for HIV or AIDS -birth control pills -certain antibiotics like rifabutin, rifampin -certain  medicines for blood pressure like amlodipine, isradipine, felodipine, hydrochlorothiazide, losartan, nifedipine -certain medicines for cancer like cyclophosphamide, vinblastine, vincristine -certain medicines for cholesterol like atorvastatin, lovastatin, fluvastatin, simvastatin -certain medicines for depression, anxiety, or psychotic disturbances like amitriptyline, midazolam, nortriptyline, triazolam -certain medicines for diabetes like glipizide, glyburide, tolbutamide -certain medicines for pain like alfentanil, fentanyl, methadone -certain medicines for seizures like carbamazepine, phenytoin -certain medicines that treat or prevent blood clots like warfarin -halofantrine -medicines that lower your chance of fighting infection like cyclosporine, prednisone, tacrolimus -NSAIDS, medicines for pain and inflammation, like celecoxib, diclofenac, flurbiprofen, ibuprofen, meloxicam, naproxen -other medicines for fungal infections -sirolimus -theophylline -tofacitinib This list may not describe all possible interactions. Give your health care provider a list of all the medicines, herbs, non-prescription drugs, or dietary supplements you use. Also tell them if you smoke, drink alcohol, or use illegal drugs. Some items may interact with your medicine. What should I watch for while using this medicine? Visit your doctor or health care professional for regular checkups. If you are taking this medicine for a long time you may need blood work. Tell your doctor if your symptoms do not improve. Some fungal infections need many weeks or months of treatment to cure. Alcohol can increase possible damage to your liver. Avoid alcoholic drinks. If you have a vaginal infection, do not have sex until you have finished your treatment. You can wear a sanitary napkin. Do not use tampons. Wear freshly washed cotton, not synthetic, panties. What side effects may I notice from receiving this medicine? Side effects that  you should report to your doctor or health care professional as soon as possible: -allergic reactions like skin rash or itching, hives, swelling of the lips, mouth,  tongue, or throat -dark urine -feeling dizzy or faint -irregular heartbeat or chest pain -redness, blistering, peeling or loosening of the skin, including inside the mouth -trouble breathing -unusual bruising or bleeding -vomiting -yellowing of the eyes or skin Side effects that usually do not require medical attention (report to your doctor or health care professional if they continue or are bothersome): -changes in how food tastes -diarrhea -headache -stomach upset or nausea This list may not describe all possible side effects. Call your doctor for medical advice about side effects. You may report side effects to FDA at 1-800-FDA-1088. Where should I keep my medicine? Keep out of the reach of children. Store at room temperature below 30 degrees C (86 degrees F). Throw away any medicine after the expiration date. NOTE: This sheet is a summary. It may not cover all possible information. If you have questions about this medicine, talk to your doctor, pharmacist, or health care provider.    2016, Elsevier/Gold Standard. (2012-09-21 16:13:04) Phenazopyridine tablets What is this medicine? PHENAZOPYRIDINE (fen az oh PEER i deen) is a pain reliever. It is used to stop the pain, burning, or discomfort caused by infection or irritation of the urinary tract. This medicine is not an antibiotic. It will not cure a urinary tract infection. This medicine may be used for other purposes; ask your health care provider or pharmacist if you have questions. What should I tell my health care provider before I take this medicine? They need to know if you have any of these conditions: -glucose-6-phosphate dehydrogenase (G6PD) deficiency -kidney disease -an unusual or allergic reaction to phenazopyridine, other medicines, foods, dyes, or  preservatives -pregnant or trying to get pregnant -breast-feeding How should I use this medicine? Take this medicine by mouth with a glass of water. Follow the directions on the prescription label. Take after meals. Take your doses at regular intervals. Do not take your medicine more often than directed. Do not skip doses or stop your medicine early even if you feel better. Do not stop taking except on your doctor's advice. Talk to your pediatrician regarding the use of this medicine in children. Special care may be needed. Overdosage: If you think you have taken too much of this medicine contact a poison control center or emergency room at once. NOTE: This medicine is only for you. Do not share this medicine with others. What if I miss a dose? If you miss a dose, take it as soon as you can. If it is almost time for your next dose, take only that dose. Do not take double or extra doses. What may interact with this medicine? Interactions are not expected. This list may not describe all possible interactions. Give your health care provider a list of all the medicines, herbs, non-prescription drugs, or dietary supplements you use. Also tell them if you smoke, drink alcohol, or use illegal drugs. Some items may interact with your medicine. What should I watch for while using this medicine? Tell your doctor or health care professional if your symptoms do not improve or if they get worse. This medicine colors body fluids red. This effect is harmless and will go away after you are done taking the medicine. It will change urine to an dark orange or red color. The red color may stain clothing. Soft contact lenses may become permanently stained. It is best not to wear soft contact lenses while taking this medicine. If you are diabetic you may get a false positive result for sugar in your  urine. Talk to your health care provider. What side effects may I notice from receiving this medicine? Side effects that  you should report to your doctor or health care professional as soon as possible: -allergic reactions like skin rash, itching or hives, swelling of the face, lips, or tongue -blue or purple color of the skin -difficulty breathing -fever -less urine -unusual bleeding, bruising -unusual tired, weak -vomiting -yellowing of the eyes or skin Side effects that usually do not require medical attention (report to your doctor or health care professional if they continue or are bothersome): -dark urine -headache -stomach upset This list may not describe all possible side effects. Call your doctor for medical advice about side effects. You may report side effects to FDA at 1-800-FDA-1088. Where should I keep my medicine? Keep out of the reach of children. Store at room temperature between 15 and 30 degrees C (59 and 86 degrees F). Protect from light and moisture. Throw away any unused medicine after the expiration date. NOTE: This sheet is a summary. It may not cover all possible information. If you have questions about this medicine, talk to your doctor, pharmacist, or health care provider.    2016, Elsevier/Gold Standard. (2007-09-12 11:04:07) Nitrofurantoin tablets or capsules What is this medicine? NITROFURANTOIN (nye troe fyoor AN toyn) is an antibiotic. It is used to treat urinary tract infections. This medicine may be used for other purposes; ask your health care provider or pharmacist if you have questions. What should I tell my health care provider before I take this medicine? They need to know if you have any of these conditions: -anemia -diabetes -glucose-6-phosphate dehydrogenase deficiency -kidney disease -liver disease -lung disease -other chronic illness -an unusual or allergic reaction to nitrofurantoin, other antibiotics, other medicines, foods, dyes or preservatives -pregnant or trying to get pregnant -breast-feeding How should I use this medicine? Take this medicine by  mouth with a glass of water. Follow the directions on the prescription label. Take this medicine with food or milk. Take your doses at regular intervals. Do not take your medicine more often than directed. Do not stop taking except on your doctor's advice. Talk to your pediatrician regarding the use of this medicine in children. While this drug may be prescribed for selected conditions, precautions do apply. Overdosage: If you think you have taken too much of this medicine contact a poison control center or emergency room at once. NOTE: This medicine is only for you. Do not share this medicine with others. What if I miss a dose? If you miss a dose, take it as soon as you can. If it is almost time for your next dose, take only that dose. Do not take double or extra doses. What may interact with this medicine? -antacids containing magnesium trisilicate -probenecid -quinolone antibiotics like ciprofloxacin, lomefloxacin, norfloxacin and ofloxacin -sulfinpyrazone This list may not describe all possible interactions. Give your health care provider a list of all the medicines, herbs, non-prescription drugs, or dietary supplements you use. Also tell them if you smoke, drink alcohol, or use illegal drugs. Some items may interact with your medicine. What should I watch for while using this medicine? Tell your doctor or health care professional if your symptoms do not improve or if you get new symptoms. Drink several glasses of water a day. If you are taking this medicine for a long time, visit your doctor for regular checks on your progress. If you are diabetic, you may get a false positive result for sugar in  your urine with certain brands of urine tests. Check with your doctor. What side effects may I notice from receiving this medicine? Side effects that you should report to your doctor or health care professional as soon as possible: -allergic reactions like skin rash or hives, swelling of the face,  lips, or tongue -chest pain -cough -difficulty breathing -dizziness, drowsiness -fever or infection -joint aches or pains -pale or blue-tinted skin -redness, blistering, peeling or loosening of the skin, including inside the mouth -tingling, burning, pain, or numbness in hands or feet -unusual bleeding or bruising -unusually weak or tired -yellowing of eyes or skin Side effects that usually do not require medical attention (report to your doctor or health care professional if they continue or are bothersome): -dark urine -diarrhea -headache -loss of appetite -nausea or vomiting -temporary hair loss This list may not describe all possible side effects. Call your doctor for medical advice about side effects. You may report side effects to FDA at 1-800-FDA-1088. Where should I keep my medicine? Keep out of the reach of children. Store at room temperature between 15 and 30 degrees C (59 and 86 degrees F). Protect from light. Throw away any unused medicine after the expiration date. NOTE: This sheet is a summary. It may not cover all possible information. If you have questions about this medicine, talk to your doctor, pharmacist, or health care provider.    2016, Elsevier/Gold Standard. (2007-09-04 15:56:47) Urinary Tract Infection Urinary tract infections (UTIs) can develop anywhere along your urinary tract. Your urinary tract is your body's drainage system for removing wastes and extra water. Your urinary tract includes two kidneys, two ureters, a bladder, and a urethra. Your kidneys are a pair of bean-shaped organs. Each kidney is about the size of your fist. They are located below your ribs, one on each side of your spine. CAUSES Infections are caused by microbes, which are microscopic organisms, including fungi, viruses, and bacteria. These organisms are so small that they can only be seen through a microscope. Bacteria are the microbes that most commonly cause UTIs. SYMPTOMS    Symptoms of UTIs may vary by age and gender of the patient and by the location of the infection. Symptoms in young women typically include a frequent and intense urge to urinate and a painful, burning feeling in the bladder or urethra during urination. Older women and men are more likely to be tired, shaky, and weak and have muscle aches and abdominal pain. A fever may mean the infection is in your kidneys. Other symptoms of a kidney infection include pain in your back or sides below the ribs, nausea, and vomiting. DIAGNOSIS To diagnose a UTI, your caregiver will ask you about your symptoms. Your caregiver will also ask you to provide a urine sample. The urine sample will be tested for bacteria and white blood cells. White blood cells are made by your body to help fight infection. TREATMENT  Typically, UTIs can be treated with medication. Because most UTIs are caused by a bacterial infection, they usually can be treated with the use of antibiotics. The choice of antibiotic and length of treatment depend on your symptoms and the type of bacteria causing your infection. HOME CARE INSTRUCTIONS  If you were prescribed antibiotics, take them exactly as your caregiver instructs you. Finish the medication even if you feel better after you have only taken some of the medication.  Drink enough water and fluids to keep your urine clear or pale yellow.  Avoid caffeine, tea,  and carbonated beverages. They tend to irritate your bladder.  Empty your bladder often. Avoid holding urine for long periods of time.  Empty your bladder before and after sexual intercourse.  After a bowel movement, women should cleanse from front to back. Use each tissue only once. SEEK MEDICAL CARE IF:   You have back pain.  You develop a fever.  Your symptoms do not begin to resolve within 3 days. SEEK IMMEDIATE MEDICAL CARE IF:   You have severe back pain or lower abdominal pain.  You develop chills.  You have nausea or  vomiting.  You have continued burning or discomfort with urination. MAKE SURE YOU:   Understand these instructions.  Will watch your condition.  Will get help right away if you are not doing well or get worse.   This information is not intended to replace advice given to you by your health care provider. Make sure you discuss any questions you have with your health care provider.   Document Released: 11/23/2004 Document Revised: 11/04/2014 Document Reviewed: 03/24/2011 Elsevier Interactive Patient Education [employer].

## 2015-07-29 NOTE — Progress Notes (Signed)
   HPI: Patient is a 75 year old that presented to the office today complaining for several days of frequency in urination and burning since Tuesday. She's had some vulvar pruritus but no true vaginal discharge. She denied any fever, chills, nausea, vomiting or any back pain. No GU complaints. No change in appetite.   ROS: A ROS was performed and pertinent positives and negatives are included in the history.  GENERAL: No fevers or chills. HEENT: No change in vision, no earache, sore throat or sinus congestion. NECK: No pain or stiffness. CARDIOVASCULAR: No chest pain or pressure. No palpitations. PULMONARY: No shortness of breath, cough or wheeze. GASTROINTESTINAL: No abdominal pain, nausea, vomiting or diarrhea, melena or bright red blood per rectum. GENITOURINARY: Urinary frequency and dysuria. MUSCULOSKELETAL: No joint or muscle pain, no back pain, no recent trauma. DERMATOLOGIC: No rash, no itching, no lesions. ENDOCRINE: No polyuria, polydipsia, no heat or cold intolerance. No recent change in weight. HEMATOLOGICAL: No anemia or easy bruising or bleeding. NEUROLOGIC: No headache, seizures, numbness, tingling or weakness. PSYCHIATRIC: No depression, no loss of interest in normal activity or change in sleep pattern.   PE: Blood pressure 126/80 Gen. appearance well-developed nourished female with the above-mentioned complaint Abdomen: Soft nontender no rebound or guarding Pelvic: Bartholin urethra Skene was within normal limits Vagina: No lesions or discharge Cervix: No lesions or discharge Bimanual exam uterus slightly anteverted mild suprapubic tenderness but no rebound Adnexa: No palpable masses or tenderness Rectal exam: Not done  Wet prep was negative the exception few bacteria  Urinalysis many bacteria, 3-10 RBC, greater than 60 WBC   Assessment Plan: Signs and symptoms consistent with urinary tract infection. Patient will be started on Macrobid one by mouth twice a day for 7 days. For  her dysuria ongoing 2: And anti-spasmodic agent such as Pyridium 200 mg one by mouth 3 times a day for 3 days. She was encouraged to increase her fluid intake. If she develops any back pain, fever, chills, nausea or vomiting she should report to the office but if after-hours to the emergency room. She will be prescribed Diflucan 150 mg because of her vulvar pruritus but she will stop her statin for 3 days when she takes it.    Greater than 50% of time was spent in counseling and coordinating care of this patient.   Time of consultation: 15   Minutes.

## 2015-08-01 LAB — URINE CULTURE

## 2015-10-13 DIAGNOSIS — M170 Bilateral primary osteoarthritis of knee: Secondary | ICD-10-CM | POA: Diagnosis not present

## 2015-10-13 DIAGNOSIS — M1711 Unilateral primary osteoarthritis, right knee: Secondary | ICD-10-CM | POA: Diagnosis not present

## 2015-10-13 DIAGNOSIS — M1712 Unilateral primary osteoarthritis, left knee: Secondary | ICD-10-CM | POA: Diagnosis not present

## 2015-10-19 DIAGNOSIS — H2513 Age-related nuclear cataract, bilateral: Secondary | ICD-10-CM | POA: Diagnosis not present

## 2015-10-19 DIAGNOSIS — H401122 Primary open-angle glaucoma, left eye, moderate stage: Secondary | ICD-10-CM | POA: Diagnosis not present

## 2015-10-19 DIAGNOSIS — H401112 Primary open-angle glaucoma, right eye, moderate stage: Secondary | ICD-10-CM | POA: Diagnosis not present

## 2015-10-20 DIAGNOSIS — M1711 Unilateral primary osteoarthritis, right knee: Secondary | ICD-10-CM | POA: Diagnosis not present

## 2015-10-20 DIAGNOSIS — M1712 Unilateral primary osteoarthritis, left knee: Secondary | ICD-10-CM | POA: Diagnosis not present

## 2015-10-20 DIAGNOSIS — M170 Bilateral primary osteoarthritis of knee: Secondary | ICD-10-CM | POA: Diagnosis not present

## 2015-10-27 DIAGNOSIS — M170 Bilateral primary osteoarthritis of knee: Secondary | ICD-10-CM | POA: Diagnosis not present

## 2015-12-01 DIAGNOSIS — B079 Viral wart, unspecified: Secondary | ICD-10-CM | POA: Diagnosis not present

## 2015-12-01 DIAGNOSIS — L570 Actinic keratosis: Secondary | ICD-10-CM | POA: Diagnosis not present

## 2015-12-01 DIAGNOSIS — D239 Other benign neoplasm of skin, unspecified: Secondary | ICD-10-CM | POA: Diagnosis not present

## 2016-01-06 DIAGNOSIS — K219 Gastro-esophageal reflux disease without esophagitis: Secondary | ICD-10-CM | POA: Diagnosis not present

## 2016-01-06 DIAGNOSIS — R7301 Impaired fasting glucose: Secondary | ICD-10-CM | POA: Diagnosis not present

## 2016-01-06 DIAGNOSIS — Z0000 Encounter for general adult medical examination without abnormal findings: Secondary | ICD-10-CM | POA: Diagnosis not present

## 2016-01-06 DIAGNOSIS — F419 Anxiety disorder, unspecified: Secondary | ICD-10-CM | POA: Diagnosis not present

## 2016-01-06 DIAGNOSIS — E7800 Pure hypercholesterolemia, unspecified: Secondary | ICD-10-CM | POA: Diagnosis not present

## 2016-01-06 DIAGNOSIS — R109 Unspecified abdominal pain: Secondary | ICD-10-CM | POA: Diagnosis not present

## 2016-01-06 DIAGNOSIS — Z5181 Encounter for therapeutic drug level monitoring: Secondary | ICD-10-CM | POA: Diagnosis not present

## 2016-01-06 DIAGNOSIS — E039 Hypothyroidism, unspecified: Secondary | ICD-10-CM | POA: Diagnosis not present

## 2016-01-06 DIAGNOSIS — M8580 Other specified disorders of bone density and structure, unspecified site: Secondary | ICD-10-CM | POA: Diagnosis not present

## 2016-01-10 ENCOUNTER — Other Ambulatory Visit: Payer: Self-pay | Admitting: Family Medicine

## 2016-01-10 DIAGNOSIS — Z1231 Encounter for screening mammogram for malignant neoplasm of breast: Secondary | ICD-10-CM

## 2016-01-10 DIAGNOSIS — Z23 Encounter for immunization: Secondary | ICD-10-CM | POA: Diagnosis not present

## 2016-03-23 ENCOUNTER — Ambulatory Visit
Admission: RE | Admit: 2016-03-23 | Discharge: 2016-03-23 | Disposition: A | Discharge status: Home / Self Care | Payer: Medicare Other | Source: Ambulatory Visit | Attending: Family Medicine | Admitting: Family Medicine

## 2016-03-23 DIAGNOSIS — Z1231 Encounter for screening mammogram for malignant neoplasm of breast: Secondary | ICD-10-CM

## 2016-03-29 ENCOUNTER — Ambulatory Visit (INDEPENDENT_AMBULATORY_CARE_PROVIDER_SITE_OTHER): Payer: Medicare Other | Admitting: Gynecology

## 2016-03-29 ENCOUNTER — Encounter: Payer: Self-pay | Admitting: Gynecology

## 2016-03-29 VITALS — BP 124/78 | Ht 62.0 in | Wt 160.0 lb

## 2016-03-29 DIAGNOSIS — R197 Diarrhea, unspecified: Secondary | ICD-10-CM | POA: Diagnosis not present

## 2016-03-29 DIAGNOSIS — R1032 Left lower quadrant pain: Secondary | ICD-10-CM

## 2016-03-29 DIAGNOSIS — Z01411 Encounter for gynecological examination (general) (routine) with abnormal findings: Secondary | ICD-10-CM | POA: Diagnosis not present

## 2016-03-29 DIAGNOSIS — M8580 Other specified disorders of bone density and structure, unspecified site: Secondary | ICD-10-CM | POA: Diagnosis not present

## 2016-03-29 DIAGNOSIS — Z8542 Personal history of malignant neoplasm of other parts of uterus: Secondary | ICD-10-CM

## 2016-03-29 MED ORDER — TRAZODONE HCL 100 MG PO TABS: 100.0000 mg | ORAL_TABLET | Freq: Every day | ORAL | 4 refills | Status: DC

## 2016-03-29 NOTE — Addendum Note (Signed)
Addended by: Burnett Kanaris on: 03/29/2016 10:37 AM   Modules accepted: Orders

## 2016-03-29 NOTE — Patient Instructions (Signed)
Diarrhea, Adult °Introduction °Diarrhea is when you have loose and water poop (stool) often. Diarrhea can make you feel weak and cause you to get dehydrated. Dehydration can make you tired and thirsty, make you have a dry mouth, and make it so you pee (urinate) less often. Diarrhea often lasts 2-3 days. However, it can last longer if it is a sign of something more serious. It is important to treat your diarrhea as told by your doctor. °Follow these instructions at home: °Eating and drinking °Follow these recommendations as told by your doctor: °· Take an oral rehydration solution (ORS). This is a drink that is sold at pharmacies and stores. °· Drink clear fluids, such as: °¨ Water. °¨ Ice chips. °¨ Diluted fruit juice. °¨ Low-calorie sports drinks. °· Eat bland, easy-to-digest foods in small amounts as you are able. These foods include: °¨ Bananas. °¨ Applesauce. °¨ Rice. °¨ Low-fat (lean) meats. °¨ Toast. °¨ Crackers. °· Avoid drinking fluids that have a lot of sugar or caffeine in them. °· Avoid alcohol. °· Avoid spicy or fatty foods. °General instructions °· Drink enough fluid to keep your pee (urine) clear or pale yellow. °· Wash your hands often. If you cannot use soap and water, use hand sanitizer. °· Make sure that all people in your home wash their hands well and often. °· Take over-the-counter and prescription medicines only as told by your doctor. °· Rest at home while you get better. °· Watch your condition for any changes. °· Take a warm bath to help with any burning or pain from having diarrhea. °· Keep all follow-up visits as told by your doctor. This is important. °Contact a doctor if: °· You have a fever. °· Your diarrhea gets worse. °· You have new symptoms. °· You cannot keep fluids down. °· You feel light-headed or dizzy. °· You have a headache. °· You have muscle cramps. °Get help right away if: °· You have chest pain. °· You feel very weak or you pass out (faint). °· You have bloody or black  poop or poop that look like tar. °· You have very bad pain, cramping, or bloating in your belly (abdomen). °· You have trouble breathing or you are breathing very quickly. °· Your heart is beating very quickly. °· Your skin feels cold and clammy. °· You feel confused. °· You have signs of dehydration, such as: °¨ Dark pee, hardly any pee, or no pee. °¨ Cracked lips. °¨ Dry mouth. °¨ Sunken eyes. °¨ Sleepiness. °¨ Weakness. °This information is not intended to replace advice given to you by your health care provider. Make sure you discuss any questions you have with your health care provider. °Document Released: 08/02/2007 Document Revised: 09/03/2015 Document Reviewed: 10/20/2014 °© 2017 Elsevier ° °

## 2016-03-29 NOTE — Addendum Note (Signed)
Addended by: Burnett Kanaris on: 03/29/2016 11:58 AM   Modules accepted: Orders

## 2016-03-29 NOTE — Progress Notes (Signed)
DEENAH GRAEF [birth year] [id number]   History:    76 y.o.  for annual gyn exam who was complaining of the past few days a watery loose stool but no blood. Also some nonspecific left lower quadrant abdominal discomfort which is been going on for several months. Patient was seen for the first time as a new patient in 2015. Review of her record and indicated the following:  1981 at time of abdominal hysterectomy for fibroid uterus incidental findings of adenocarcinoma was noted. Patient stated few days later she was reoperated on to remove both her tubes and ovary. 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  1992 patient had enterocele and rectocele repair grade 3  Her follow-up Pap smears have been normal. Dr. Inda Merlin is her PCP who is been doing her blood work. All her vaccines are up-to-date. Her colonoscopy was in 2007. Patient states she does have history of polyps. She had been followed by Dr. Watt Climes. Patient's last bone density study in 2017 demonstrated the lowest T score was -1.6 at the left femoral neck and she had a normal Frax analysis.  Patient had been on estrogen replacement therapy consisting of estrogen patch for many years and it was discontinued last year she occasional have a hot flash before bedtime but otherwise is been doing well.  Pap smear 2012, 2014, 2015 in 2016 and 2017 have been normal.  Past medical history,surgical history, family history and social history were all reviewed and documented in the EPIC chart.  Gynecologic History No LMP recorded. Patient is postmenopausal. Contraception: status post hysterectomy Last Pap: 2017. Results were: normal Last mammogram: January 2018. Results were: normal  Obstetric History OB History  Gravida Para Term Preterm AB Living  2 2       2   SAB TAB Ectopic Multiple Live Births               # Outcome Date GA Lbr Len/2nd Weight Sex  Delivery Anes PTL Lv  2 Para           1 Para                ROS: A ROS was performed and pertinent positives and negatives are included in the history.  GENERAL: No fevers or chills. HEENT: No change in vision, no earache, sore throat or sinus congestion. NECK: No pain or stiffness. CARDIOVASCULAR: No chest pain or pressure. No palpitations. PULMONARY: No shortness of breath, cough or wheeze. GASTROINTESTINAL: No abdominal pain, nausea, vomiting or diarrhea, melena or bright red blood per rectum. GENITOURINARY: No urinary frequency, urgency, hesitancy or dysuria. MUSCULOSKELETAL: No joint or muscle pain, no back pain, no recent trauma. DERMATOLOGIC: No rash, no itching, no lesions. ENDOCRINE: No polyuria, polydipsia, no heat or cold intolerance. No recent change in weight. HEMATOLOGICAL: No anemia or easy bruising or bleeding. NEUROLOGIC: No headache, seizures, numbness, tingling or weakness. PSYCHIATRIC: No depression, no loss of interest in normal activity or change in sleep pattern.     Exam: chaperone present  BP 124/78   Ht 5\' 2"  (1.575 m)   Wt 160 lb (72.6 kg)   BMI 29.26 kg/m   Body mass index is 29.26 kg/m.  General appearance : Well developed well nourished female. No acute distress HEENT: Eyes: no retinal hemorrhage or exudates,  Neck supple, trachea midline, no carotid bruits, no thyroidmegaly Lungs: Clear to auscultation, no rhonchi or  wheezes, or rib retractions  Heart: Regular rate and rhythm, no murmurs or gallops Breast:Examined in sitting and supine position were symmetrical in appearance, no palpable masses or tenderness,  no skin retraction, no nipple inversion, no nipple discharge, no skin discoloration, no axillary or supraclavicular lymphadenopathy Abdomen: no palpable masses or tenderness, no rebound or guarding Extremities: no edema or skin discoloration or tenderness  Pelvic:  Bartholin, Urethra, Skene Glands: Within normal limits             Vagina: No  gross lesions or discharge  Cervix: Absent  Uterus  absent  Adnexa  Without masses or tenderness  Anus and perineum  normal   Rectovaginal  normal sphincter tone without palpated masses or tenderness             Hemoccult PCP provides     Assessment/Plan:  76 y.o. female for annual exam will have her Pap smear today due to her past history of uterine cancer. PCP has been doing her blood work. All her vaccines are up-to-date. Patient with recent diarrhea will be instructed to use Imodium over-the-counter and to stay on full liquids for 24 hours and gradually increase her diet after the 24 hours. If she continues with symptoms she'll follow-up with her PCP. She was encouraged to take her vitamin D 2000 units daily and to continue weightbearing exercises for for 1 hour 3 times a week for osteoporosis prevention. It appears her left lower abdominal discomfort comes ago could be attributed to adhesions. We did discuss signs and symptoms of diverticulosis and diverticulitis to be aware of in the event that symptoms do developed to seek medical attention.  15 minutes were spent on discussing diverticulosis, diverticulitis, etiologies for abdominal pain in addition to her annual exam. We also discussed potential etiologies for diarrhea and treatment. Literature information provided.   Terrance Mass MD, 10:18 AM 03/29/2016

## 2016-03-31 LAB — PAP IG W/ RFLX HPV ASCU

## 2016-04-21 DIAGNOSIS — H5213 Myopia, bilateral: Secondary | ICD-10-CM | POA: Diagnosis not present

## 2016-04-21 DIAGNOSIS — H401132 Primary open-angle glaucoma, bilateral, moderate stage: Secondary | ICD-10-CM | POA: Diagnosis not present

## 2016-04-21 DIAGNOSIS — H2513 Age-related nuclear cataract, bilateral: Secondary | ICD-10-CM | POA: Diagnosis not present

## 2016-05-12 ENCOUNTER — Other Ambulatory Visit: Payer: Self-pay | Admitting: Gynecology

## 2016-07-12 ENCOUNTER — Encounter: Payer: Self-pay | Admitting: Gynecology

## 2016-07-17 DIAGNOSIS — N390 Urinary tract infection, site not specified: Secondary | ICD-10-CM | POA: Diagnosis not present

## 2016-07-17 DIAGNOSIS — Z5181 Encounter for therapeutic drug level monitoring: Secondary | ICD-10-CM | POA: Diagnosis not present

## 2016-07-17 DIAGNOSIS — F419 Anxiety disorder, unspecified: Secondary | ICD-10-CM | POA: Diagnosis not present

## 2016-07-17 DIAGNOSIS — E7800 Pure hypercholesterolemia, unspecified: Secondary | ICD-10-CM | POA: Diagnosis not present

## 2016-07-17 DIAGNOSIS — E039 Hypothyroidism, unspecified: Secondary | ICD-10-CM | POA: Diagnosis not present

## 2016-08-11 DIAGNOSIS — L821 Other seborrheic keratosis: Secondary | ICD-10-CM | POA: Diagnosis not present

## 2016-08-11 DIAGNOSIS — D229 Melanocytic nevi, unspecified: Secondary | ICD-10-CM | POA: Diagnosis not present

## 2016-08-22 DIAGNOSIS — N390 Urinary tract infection, site not specified: Secondary | ICD-10-CM | POA: Diagnosis not present

## 2016-09-07 DIAGNOSIS — Z87440 Personal history of urinary (tract) infections: Secondary | ICD-10-CM | POA: Diagnosis not present

## 2016-09-07 DIAGNOSIS — N390 Urinary tract infection, site not specified: Secondary | ICD-10-CM | POA: Diagnosis not present

## 2016-10-10 DIAGNOSIS — M1712 Unilateral primary osteoarthritis, left knee: Secondary | ICD-10-CM | POA: Diagnosis not present

## 2016-10-10 DIAGNOSIS — M170 Bilateral primary osteoarthritis of knee: Secondary | ICD-10-CM | POA: Diagnosis not present

## 2016-10-10 DIAGNOSIS — M1711 Unilateral primary osteoarthritis, right knee: Secondary | ICD-10-CM | POA: Diagnosis not present

## 2016-10-18 DIAGNOSIS — M170 Bilateral primary osteoarthritis of knee: Secondary | ICD-10-CM | POA: Diagnosis not present

## 2016-10-20 DIAGNOSIS — H401132 Primary open-angle glaucoma, bilateral, moderate stage: Secondary | ICD-10-CM | POA: Diagnosis not present

## 2016-10-25 DIAGNOSIS — M170 Bilateral primary osteoarthritis of knee: Secondary | ICD-10-CM | POA: Diagnosis not present

## 2016-11-06 DIAGNOSIS — R109 Unspecified abdominal pain: Secondary | ICD-10-CM | POA: Diagnosis not present

## 2016-12-08 DIAGNOSIS — Z23 Encounter for immunization: Secondary | ICD-10-CM | POA: Diagnosis not present

## 2017-01-23 ENCOUNTER — Other Ambulatory Visit: Payer: Self-pay | Admitting: Family Medicine

## 2017-01-23 DIAGNOSIS — Z1231 Encounter for screening mammogram for malignant neoplasm of breast: Secondary | ICD-10-CM

## 2017-01-25 DIAGNOSIS — M8580 Other specified disorders of bone density and structure, unspecified site: Secondary | ICD-10-CM | POA: Diagnosis not present

## 2017-01-25 DIAGNOSIS — Z0000 Encounter for general adult medical examination without abnormal findings: Secondary | ICD-10-CM | POA: Diagnosis not present

## 2017-01-25 DIAGNOSIS — K219 Gastro-esophageal reflux disease without esophagitis: Secondary | ICD-10-CM | POA: Diagnosis not present

## 2017-01-25 DIAGNOSIS — E7800 Pure hypercholesterolemia, unspecified: Secondary | ICD-10-CM | POA: Diagnosis not present

## 2017-01-25 DIAGNOSIS — Z79899 Other long term (current) drug therapy: Secondary | ICD-10-CM | POA: Diagnosis not present

## 2017-01-25 DIAGNOSIS — E039 Hypothyroidism, unspecified: Secondary | ICD-10-CM | POA: Diagnosis not present

## 2017-01-25 DIAGNOSIS — F419 Anxiety disorder, unspecified: Secondary | ICD-10-CM | POA: Diagnosis not present

## 2017-03-02 DIAGNOSIS — R7309 Other abnormal glucose: Secondary | ICD-10-CM | POA: Diagnosis not present

## 2017-03-26 ENCOUNTER — Ambulatory Visit
Admission: RE | Admit: 2017-03-26 | Discharge: 2017-03-26 | Disposition: A | Discharge status: Home / Self Care | Payer: Medicare Other | Source: Ambulatory Visit | Attending: Family Medicine | Admitting: Family Medicine

## 2017-03-26 DIAGNOSIS — Z1231 Encounter for screening mammogram for malignant neoplasm of breast: Secondary | ICD-10-CM | POA: Diagnosis not present

## 2017-03-29 ENCOUNTER — Ambulatory Visit: Payer: Medicare Other | Admitting: Registered"

## 2017-03-30 ENCOUNTER — Ambulatory Visit (INDEPENDENT_AMBULATORY_CARE_PROVIDER_SITE_OTHER): Payer: Medicare Other | Admitting: Obstetrics & Gynecology

## 2017-03-30 ENCOUNTER — Encounter: Payer: Self-pay | Admitting: Obstetrics & Gynecology

## 2017-03-30 VITALS — BP 128/70 | Ht 62.5 in | Wt 154.0 lb

## 2017-03-30 DIAGNOSIS — Z01411 Encounter for gynecological examination (general) (routine) with abnormal findings: Secondary | ICD-10-CM

## 2017-03-30 DIAGNOSIS — Z90710 Acquired absence of both cervix and uterus: Secondary | ICD-10-CM

## 2017-03-30 DIAGNOSIS — Z90722 Acquired absence of ovaries, bilateral: Secondary | ICD-10-CM

## 2017-03-30 DIAGNOSIS — Z8542 Personal history of malignant neoplasm of other parts of uterus: Secondary | ICD-10-CM

## 2017-03-30 DIAGNOSIS — Z1272 Encounter for screening for malignant neoplasm of vagina: Secondary | ICD-10-CM

## 2017-03-30 DIAGNOSIS — F418 Other specified anxiety disorders: Secondary | ICD-10-CM | POA: Diagnosis not present

## 2017-03-30 DIAGNOSIS — R1032 Left lower quadrant pain: Secondary | ICD-10-CM

## 2017-03-30 DIAGNOSIS — Z9079 Acquired absence of other genital organ(s): Secondary | ICD-10-CM

## 2017-03-30 DIAGNOSIS — Z13820 Encounter for screening for osteoporosis: Secondary | ICD-10-CM

## 2017-03-30 MED ORDER — TRAZODONE HCL 100 MG PO TABS: 100.0000 mg | ORAL_TABLET | Freq: Every day | ORAL | 3 refills | Status: DC

## 2017-03-30 NOTE — Progress Notes (Signed)
Carly [name] Sep 01, 1940 [id number]   History:    77 y.o. G2P2L2 Widowed, husband died 9 weeks ago.  RP:  Established patient presenting for annual gyn exam   HPI: S/P TAH/BSO in 1981 for fibroids/incidental finding of Endometrial AdenoCa Stage 1A grade 1.  No pelvic pain.  Abstinent.  Breasts normal.  Urine and bowel movements normal.  Very recent death of her husband with depressive moods and anxiety, but no evidence of major depression and no suicidal ideation.  Body mass index 27.72.  Past medical history,surgical history, family history and social history were all reviewed and documented in the EPIC chart.  Gynecologic History No LMP recorded. Patient is postmenopausal. Contraception: status post hysterectomy Last Pap: 02/2016. Results were: Negative Last mammogram: 02/2017. Results were: Negative Bone Density: 03/2015 Osteopenia T-Score -1.6.  Will repeat in March 2019 at the [facility]. Colonoscopy: 2007, but per patient, might have had a more recent one, will call her Gastro-Enterologist to verify.  Obstetric History OB History  Gravida Para Term Preterm AB Living  2 2       2   SAB TAB Ectopic Multiple Live Births               # Outcome Date GA Lbr Len/2nd Weight Sex Delivery Anes PTL Lv  2 Para           1 Para                ROS: A ROS was performed and pertinent positives and negatives are included in the history.  GENERAL: No fevers or chills. HEENT: No change in vision, no earache, sore throat or sinus congestion. NECK: No pain or stiffness. CARDIOVASCULAR: No chest pain or pressure. No palpitations. PULMONARY: No shortness of breath, cough or wheeze. GASTROINTESTINAL: No abdominal pain, nausea, vomiting or diarrhea, melena or bright red blood per rectum. GENITOURINARY: No urinary frequency, urgency, hesitancy or dysuria. MUSCULOSKELETAL: No joint or muscle pain, no back pain, no recent trauma. DERMATOLOGIC: No rash, no itching, no lesions. ENDOCRINE: No  polyuria, polydipsia, no heat or cold intolerance. No recent change in weight. HEMATOLOGICAL: No anemia or easy bruising or bleeding. NEUROLOGIC: No headache, seizures, numbness, tingling or weakness. PSYCHIATRIC: No depression, no loss of interest in normal activity or change in sleep pattern.     Exam:   BP 128/70   Ht 5' 2.5" (1.588 m)   Wt 154 lb (69.9 kg)   BMI 27.72 kg/m   Body mass index is 27.72 kg/m.  General appearance : Well developed well nourished female. No acute distress HEENT: Eyes: no retinal hemorrhage or exudates,  Neck supple, trachea midline, no carotid bruits, no thyroidmegaly Lungs: Clear to auscultation, no rhonchi or wheezes, or rib retractions  Heart: Regular rate and rhythm, no murmurs or gallops Breast:Examined in sitting and supine position were symmetrical in appearance, no palpable masses or tenderness,  no skin retraction, no nipple inversion, no nipple discharge, no skin discoloration, no axillary or supraclavicular lymphadenopathy Abdomen: no palpable masses or tenderness, no rebound or guarding Extremities: no edema or skin discoloration or tenderness  Pelvic: Vulva: Normal             Vagina: No gross lesions or discharge.  Pap reflex on vaginal vault.  Cervix/Uterus absent  Adnexa  Without masses or tenderness  Anus: Normal   Assessment/Plan:  77 y.o. female for annual exam   1. Encounter for gynecological examination with abnormal finding Gynecologic exam status post TAH/BSO.  Pap reflex done on vaginal vault.  Breast exam normal.  Screening mammogram negative January 2019.  Patient will verify with gastroenterology to know when the next screening colonoscopy is due.  Continue regular physical activity.  2. S/P TAH-BSO  3. History of uterine cancer Status post TAH/BSO in 1981 with an incidental finding of adenocarcinoma stage 1A, grade 1 of the endometrium.  Will continue with Pap test of the vaginal vault annually.  4. LLQ pain Probably  of gastrointestinal origin.  Will adjust nutrition and physical activity.  Will also contact her gastroenterologist for evaluation and possibly screening colonoscopy.  5. Depression with anxiety Situational depression with anxiety following the death of her husband 9 weeks ago.  Trazodone represcribed.  Recommend psychotherapy.  6. Screening for osteoporosis Vitamin D supplements, calcium rich nutrition and regular weightbearing physical activity recommended.  Follow-up for bone density. - DG Bone Density; Future  Other orders - traZODone (DESYREL) 100 MG tablet; Take 1 tablet (100 mg total) by mouth at bedtime.  Counseling on above issues more than 50% for 15 minutes.  Princess Bruins MD, 9:54 AM 03/30/2017

## 2017-04-03 ENCOUNTER — Encounter: Payer: Self-pay | Admitting: Obstetrics & Gynecology

## 2017-04-03 NOTE — Patient Instructions (Signed)
1. Encounter for gynecological examination with abnormal finding Gynecologic exam status post TAH/BSO.  Pap reflex done on vaginal vault.  Breast exam normal.  Screening mammogram negative January 2019.  Patient will verify with gastroenterology to know when the next screening colonoscopy is due.  Continue regular physical activity.  2. S/P TAH-BSO  3. History of uterine cancer Status post TAH/BSO in 1981 with an incidental finding of adenocarcinoma stage 1A, grade 1 of the endometrium.  Will continue with Pap test of the vaginal vault annually.  4. LLQ pain Probably of gastrointestinal origin.  Will adjust nutrition and physical activity.  Will also contact her gastroenterologist for evaluation and possibly screening colonoscopy.  5. Depression with anxiety Situational depression with anxiety following the death of her husband 9 weeks ago.  Trazodone represcribed.  Recommend psychotherapy.  6. Screening for osteoporosis Vitamin D supplements, calcium rich nutrition and regular weightbearing physical activity recommended.  Follow-up for bone density. - DG Bone Density; Future  Other orders - traZODone (DESYREL) 100 MG tablet; Take 1 tablet (100 mg total) by mouth at bedtime.   Mahi, it was a pleasure meeting you today!  I will inform you of your results as soon as they are available.   Health Maintenance for Postmenopausal Women Menopause is a normal process in which your reproductive ability comes to an end. This process happens gradually over a span of months to years, usually between the ages of 48 and 55. Menopause is complete when you have missed 12 consecutive menstrual periods. It is important to talk with your health care provider about some of the most common conditions that affect postmenopausal women, such as heart disease, cancer, and bone loss (osteoporosis). Adopting a healthy lifestyle and getting preventive care can help to promote your health and wellness. Those actions  can also lower your chances of developing some of these common conditions. What should I know about menopause? During menopause, you may experience a number of symptoms, such as:  Moderate-to-severe hot flashes.  Night sweats.  Decrease in sex drive.  Mood swings.  Headaches.  Tiredness.  Irritability.  Memory problems.  Insomnia.  Choosing to treat or not to treat menopausal changes is an individual decision that you make with your health care provider. What should I know about hormone replacement therapy and supplements? Hormone therapy products are effective for treating symptoms that are associated with menopause, such as hot flashes and night sweats. Hormone replacement carries certain risks, especially as you become older. If you are thinking about using estrogen or estrogen with progestin treatments, discuss the benefits and risks with your health care provider. What should I know about heart disease and stroke? Heart disease, heart attack, and stroke become more likely as you age. This may be due, in part, to the hormonal changes that your body experiences during menopause. These can affect how your body processes dietary fats, triglycerides, and cholesterol. Heart attack and stroke are both medical emergencies. There are many things that you can do to help prevent heart disease and stroke:  Have your blood pressure checked at least every 1-2 years. High blood pressure causes heart disease and increases the risk of stroke.  If you are 55-79 years old, ask your health care provider if you should take aspirin to prevent a heart attack or a stroke.  Do not use any tobacco products, including cigarettes, chewing tobacco, or electronic cigarettes. If you need help quitting, ask your health care provider.  It is important to eat a   healthy diet and maintain a healthy weight. ? Be sure to include plenty of vegetables, fruits, low-fat dairy products, and lean protein. ? Avoid  eating foods that are high in solid fats, added sugars, or salt (sodium).  Get regular exercise. This is one of the most important things that you can do for your health. ? Try to exercise for at least 150 minutes each week. The type of exercise that you do should increase your heart rate and make you sweat. This is known as moderate-intensity exercise. ? Try to do strengthening exercises at least twice each week. Do these in addition to the moderate-intensity exercise.  Know your numbers.Ask your health care provider to check your cholesterol and your blood glucose. Continue to have your blood tested as directed by your health care provider.  What should I know about cancer screening? There are several types of cancer. Take the following steps to reduce your risk and to catch any cancer development as early as possible. Breast Cancer  Practice breast self-awareness. ? This means understanding how your breasts normally appear and feel. ? It also means doing regular breast self-exams. Let your health care provider know about any changes, no matter how small.  If you are 51 or older, have a clinician do a breast exam (clinical breast exam or CBE) every year. Depending on your age, family history, and medical history, it may be recommended that you also have a yearly breast X-ray (mammogram).  If you have a family history of breast cancer, talk with your health care provider about genetic screening.  If you are at high risk for breast cancer, talk with your health care provider about having an MRI and a mammogram every year.  Breast cancer (BRCA) gene test is recommended for women who have family members with BRCA-related cancers. Results of the assessment will determine the need for genetic counseling and BRCA1 and for BRCA2 testing. BRCA-related cancers include these types: ? Breast. This occurs in males or females. ? Ovarian. ? Tubal. This may also be called fallopian tube cancer. ? Cancer  of the abdominal or pelvic lining (peritoneal cancer). ? Prostate. ? Pancreatic.  Cervical, Uterine, and Ovarian Cancer Your health care provider may recommend that you be screened regularly for cancer of the pelvic organs. These include your ovaries, uterus, and vagina. This screening involves a pelvic exam, which includes checking for microscopic changes to the surface of your cervix (Pap test).  For women ages 21-65, health care providers may recommend a pelvic exam and a Pap test every three years. For women ages 15-65, they may recommend the Pap test and pelvic exam, combined with testing for human papilloma virus (HPV), every five years. Some types of HPV increase your risk of cervical cancer. Testing for HPV may also be done on women of any age who have unclear Pap test results.  Other health care providers may not recommend any screening for nonpregnant women who are considered low risk for pelvic cancer and have no symptoms. Ask your health care provider if a screening pelvic exam is right for you.  If you have had past treatment for cervical cancer or a condition that could lead to cancer, you need Pap tests and screening for cancer for at least 20 years after your treatment. If Pap tests have been discontinued for you, your risk factors (such as having a new sexual partner) need to be reassessed to determine if you should start having screenings again. Some women have medical problems  that increase the chance of getting cervical cancer. In these cases, your health care provider may recommend that you have screening and Pap tests more often.  If you have a family history of uterine cancer or ovarian cancer, talk with your health care provider about genetic screening.  If you have vaginal bleeding after reaching menopause, tell your health care provider.  There are currently no reliable tests available to screen for ovarian cancer.  Lung Cancer Lung cancer screening is recommended for  adults 51-59 years old who are at high risk for lung cancer because of a history of smoking. A yearly low-dose CT scan of the lungs is recommended if you:  Currently smoke.  Have a history of at least 30 pack-years of smoking and you currently smoke or have quit within the past 15 years. A pack-year is smoking an average of one pack of cigarettes per day for one year.  Yearly screening should:  Continue until it has been 15 years since you quit.  Stop if you develop a health problem that would prevent you from having lung cancer treatment.  Colorectal Cancer  This type of cancer can be detected and can often be prevented.  Routine colorectal cancer screening usually begins at age 34 and continues through age 49.  If you have risk factors for colon cancer, your health care provider may recommend that you be screened at an earlier age.  If you have a family history of colorectal cancer, talk with your health care provider about genetic screening.  Your health care provider may also recommend using home test kits to check for hidden blood in your stool.  A small camera at the end of a tube can be used to examine your colon directly (sigmoidoscopy or colonoscopy). This is done to check for the earliest forms of colorectal cancer.  Direct examination of the colon should be repeated every 5-10 years until age 31. However, if early forms of precancerous polyps or small growths are found or if you have a family history or genetic risk for colorectal cancer, you may need to be screened more often.  Skin Cancer  Check your skin from head to toe regularly.  Monitor any moles. Be sure to tell your health care provider: ? About any new moles or changes in moles, especially if there is a change in a mole's shape or color. ? If you have a mole that is larger than the size of a pencil eraser.  If any of your family members has a history of skin cancer, especially at a young age, talk with your  health care provider about genetic screening.  Always use sunscreen. Apply sunscreen liberally and repeatedly throughout the day.  Whenever you are outside, protect yourself by wearing long sleeves, pants, a wide-brimmed hat, and sunglasses.  What should I know about osteoporosis? Osteoporosis is a condition in which bone destruction happens more quickly than new bone creation. After menopause, you may be at an increased risk for osteoporosis. To help prevent osteoporosis or the bone fractures that can happen because of osteoporosis, the following is recommended:  If you are 18-75 years old, get at least 1,000 mg of calcium and at least 600 mg of vitamin D per day.  If you are older than age 53 but younger than age 50, get at least 1,200 mg of calcium and at least 600 mg of vitamin D per day.  If you are older than age 51, get at least 1,200 mg of  calcium and at least 800 mg of vitamin D per day.  Smoking and excessive alcohol intake increase the risk of osteoporosis. Eat foods that are rich in calcium and vitamin D, and do weight-bearing exercises several times each week as directed by your health care provider. What should I know about how menopause affects my mental health? Depression may occur at any age, but it is more common as you become older. Common symptoms of depression include:  Low or sad mood.  Changes in sleep patterns.  Changes in appetite or eating patterns.  Feeling an overall lack of motivation or enjoyment of activities that you previously enjoyed.  Frequent crying spells.  Talk with your health care provider if you think that you are experiencing depression. What should I know about immunizations? It is important that you get and maintain your immunizations. These include:  Tetanus, diphtheria, and pertussis (Tdap) booster vaccine.  Influenza every year before the flu season begins.  Pneumonia vaccine.  Shingles vaccine.  Your health care provider may  also recommend other immunizations. This information is not intended to replace advice given to you by your health care provider. Make sure you discuss any questions you have with your health care provider. Document Released: 04/07/2005 Document Revised: 09/03/2015 Document Reviewed: 11/17/2014 Elsevier Interactive Patient Education  2018 [employer].

## 2017-04-05 LAB — PAP IG W/ RFLX HPV ASCU

## 2017-04-16 ENCOUNTER — Encounter: Payer: Self-pay | Admitting: Registered"

## 2017-04-16 ENCOUNTER — Encounter: Payer: Medicare Other | Attending: Family Medicine | Admitting: Registered"

## 2017-04-16 DIAGNOSIS — E118 Type 2 diabetes mellitus with unspecified complications: Secondary | ICD-10-CM | POA: Diagnosis not present

## 2017-04-16 DIAGNOSIS — I10 Essential (primary) hypertension: Secondary | ICD-10-CM | POA: Diagnosis not present

## 2017-04-16 DIAGNOSIS — Z713 Dietary counseling and surveillance: Secondary | ICD-10-CM | POA: Insufficient documentation

## 2017-04-16 DIAGNOSIS — R109 Unspecified abdominal pain: Secondary | ICD-10-CM | POA: Diagnosis not present

## 2017-04-16 DIAGNOSIS — E119 Type 2 diabetes mellitus without complications: Secondary | ICD-10-CM

## 2017-04-16 DIAGNOSIS — N390 Urinary tract infection, site not specified: Secondary | ICD-10-CM | POA: Diagnosis not present

## 2017-04-16 NOTE — Patient Instructions (Addendum)
Over the next week, for 3 days MWF check your blood sugar 2 times per day; one fasting (goal less than 130) and 2 hours  after 1 meal (goal less than 180), take your blood log to appointment in April. If want to keep checking, call your insurance company to make sure they cover the strips and lancets. If so, call doctor's office to get prescription sent to pharmacy.  Aim to have balance meals and snacks. You can try Fairlife milk which is higher protein on your cereal to help balance your carbs. Continue your exercise routine as tolerated

## 2017-04-16 NOTE — Progress Notes (Signed)
Diabetes Self-Management Education  Visit Type: First/Initial  Appt. Start Time: 1005 Appt. End Time: 1120  04/16/2017  [name], identified by name and date of birth, is a 77 y.o. female with a diagnosis of Diabetes: Type 2.   ASSESSMENT Patient was confused by diagnosis and thought she was borderline diabetes, but appeared to accept explanation of diagnosing criteria and suggestions to help control blood sugar.    Patient states since her husband died a few months ago she has been motivated to take care of her health. Patient states during her husband's illness she focused on meeting his needs and now wants to make sure she takes care of herself to be around for her children. Pt states she has social support to help her through grieving process.  Patient started exercising 15 min each morning and evening and feels this is a sustainable habit for her to continue. Patient has also started making dietary changes which should be beneficial to lowering her BG. Patient states she is eating less, but still eating enough to feel satisfied.  Pt states she gets 6-8 hours sleep, has energy during the day.  Height 5' 2.5" (1.588 m), weight 153 lb (69.4 kg). Body mass index is 27.54 kg/m. Weight change -4.2 lbs since visit with Dr. Inda [name] 01/25/17  RD provided One Touch Verio Flex Lot #: [id number] X Exp: 04/27/2018 BG: 123 (3 hrs since last meal)  Diabetes Self-Management Education - 04/16/17 1019      Visit Information   Visit Type  First/Initial      Initial Visit   Diabetes Type  Type 2    Are you currently following a meal plan?  Yes    What type of meal plan do you follow?  eating less sweets    Are you taking your medications as prescribed?  Yes    Date Diagnosed  02/2017      Health Coping   How would you rate your overall health?  Excellent      Psychosocial Assessment   Patient Belief/Attitude about Diabetes  Denial    How often do you need to have someone help you  when you read instructions, pamphlets, or other written materials from your doctor or pharmacy?  1 - Never    What is the last grade level you completed in school?  12      Complications   Last HgB A1C per patient/outside source  6.6 % per referral    How often do you check your blood sugar?  0 times/day (not testing)    Have you had a dilated eye exam in the past 12 months?  Yes    Have you had a dental exam in the past 12 months?  No    Are you checking your feet?  Yes    How many days per week are you checking your feet?  7      Dietary Intake   Breakfast  [place] bacon, egg, 1 piece toast, coffee    Snack (morning)  none    Lunch  ham, green beans, salad, 2 small cupcakes, coffee    Snack (afternoon)  none    Dinner  peaches & cottage cheese    Snack (evening)  pretzel (microwave)    Beverage(s)  coffee, coke zero, trying to drinking 32 oz water      Exercise   Exercise Type  Light (walking / raking leaves) stationery bike 15 min 2x/day    How many days per week  to you exercise?  7    How many minutes per day do you exercise?  30    Total minutes per week of exercise  210      Patient Education   Previous Diabetes Education  No    Disease state   Definition of diabetes, type 1 and 2, and the diagnosis of diabetes    Nutrition management   Role of diet in the treatment of diabetes and the relationship between the three main macronutrients and blood glucose level;Carbohydrate counting;Food label reading, portion sizes and measuring food.    Physical activity and exercise   Role of exercise on diabetes management, blood pressure control and cardiac health.    Monitoring  Taught/evaluated SMBG meter.;Identified appropriate SMBG and/or A1C goals.    Psychosocial adjustment  Role of stress on diabetes      Individualized Goals (developed by patient)   Nutrition  General guidelines for healthy choices and portions discussed    Physical Activity  Exercise 5-7 days per week       Outcomes   Expected Outcomes  Demonstrated interest in learning. Expect positive outcomes    Future DMSE  PRN    Program Status  Completed     Individualized Plan for Diabetes Self-Management Training:   Learning Objective:  Patient will have a greater understanding of diabetes self-management. Patient education plan is to attend individual and/or group sessions per assessed needs and concerns.  Patient Instructions  Over the next week, for 3 days MWF check your blood sugar 2 times per day; one fasting (goal less than 130) and 2 hours  after 1 meal (goal less than 180), take your blood log to appointment in April. If want to keep checking, call your insurance company to make sure they cover the strips and lancets. If so, call doctor's office to get prescription sent to pharmacy.  Aim to have balance meals and snacks. You can try [employer]life milk which is higher protein on your cereal to help balance your carbs. Continue your exercise routine as tolerated  Expected Outcomes:  Demonstrated interest in learning. Expect positive outcomes  Education material provided: Living Well with Diabetes, A1C conversion sheet, My Plate and Snack sheet  If problems or questions, patient to contact team via:  Phone  Future DSME appointment: PRN

## 2017-04-17 DIAGNOSIS — H5213 Myopia, bilateral: Secondary | ICD-10-CM | POA: Diagnosis not present

## 2017-04-17 DIAGNOSIS — H2513 Age-related nuclear cataract, bilateral: Secondary | ICD-10-CM | POA: Diagnosis not present

## 2017-04-17 DIAGNOSIS — H401132 Primary open-angle glaucoma, bilateral, moderate stage: Secondary | ICD-10-CM | POA: Diagnosis not present

## 2017-04-17 DIAGNOSIS — E119 Type 2 diabetes mellitus without complications: Secondary | ICD-10-CM | POA: Insufficient documentation

## 2017-05-02 ENCOUNTER — Other Ambulatory Visit: Payer: Self-pay | Admitting: Obstetrics & Gynecology

## 2017-05-02 DIAGNOSIS — M8580 Other specified disorders of bone density and structure, unspecified site: Secondary | ICD-10-CM

## 2017-05-16 ENCOUNTER — Ambulatory Visit
Admission: RE | Admit: 2017-05-16 | Discharge: 2017-05-16 | Disposition: A | Discharge status: Home / Self Care | Payer: Medicare Other | Source: Ambulatory Visit | Attending: Obstetrics & Gynecology | Admitting: Obstetrics & Gynecology

## 2017-05-16 DIAGNOSIS — Z780 Asymptomatic menopausal state: Secondary | ICD-10-CM | POA: Diagnosis not present

## 2017-05-16 DIAGNOSIS — M8580 Other specified disorders of bone density and structure, unspecified site: Secondary | ICD-10-CM

## 2017-05-16 DIAGNOSIS — M85852 Other specified disorders of bone density and structure, left thigh: Secondary | ICD-10-CM | POA: Diagnosis not present

## 2017-05-29 ENCOUNTER — Other Ambulatory Visit: Payer: Self-pay | Admitting: Physician Assistant

## 2017-05-29 DIAGNOSIS — D485 Neoplasm of uncertain behavior of skin: Secondary | ICD-10-CM | POA: Diagnosis not present

## 2017-05-29 DIAGNOSIS — L720 Epidermal cyst: Secondary | ICD-10-CM | POA: Diagnosis not present

## 2017-05-29 DIAGNOSIS — L821 Other seborrheic keratosis: Secondary | ICD-10-CM | POA: Diagnosis not present

## 2017-05-29 DIAGNOSIS — D229 Melanocytic nevi, unspecified: Secondary | ICD-10-CM | POA: Diagnosis not present

## 2017-06-07 DIAGNOSIS — R109 Unspecified abdominal pain: Secondary | ICD-10-CM | POA: Diagnosis not present

## 2017-06-07 DIAGNOSIS — I10 Essential (primary) hypertension: Secondary | ICD-10-CM | POA: Diagnosis not present

## 2017-06-08 ENCOUNTER — Other Ambulatory Visit: Payer: Self-pay | Admitting: Family Medicine

## 2017-06-08 ENCOUNTER — Ambulatory Visit
Admission: RE | Admit: 2017-06-08 | Discharge: 2017-06-08 | Disposition: A | Discharge status: Home / Self Care | Payer: Medicare Other | Source: Ambulatory Visit | Attending: Family Medicine | Admitting: Family Medicine

## 2017-06-08 DIAGNOSIS — R109 Unspecified abdominal pain: Secondary | ICD-10-CM

## 2017-06-08 DIAGNOSIS — K862 Cyst of pancreas: Secondary | ICD-10-CM | POA: Diagnosis not present

## 2017-06-08 MED ORDER — IOPAMIDOL (ISOVUE-300) INJECTION 61%
100.0000 mL | Freq: Once | INTRAVENOUS | Status: AC | PRN
  Administered 2017-06-08: 100 mL via INTRAVENOUS

## 2017-06-11 ENCOUNTER — Other Ambulatory Visit: Payer: Self-pay | Admitting: Gastroenterology

## 2017-06-11 ENCOUNTER — Telehealth: Payer: Self-pay | Admitting: Hematology

## 2017-06-11 DIAGNOSIS — K30 Functional dyspepsia: Secondary | ICD-10-CM | POA: Diagnosis not present

## 2017-06-11 DIAGNOSIS — R1012 Left upper quadrant pain: Secondary | ICD-10-CM | POA: Diagnosis not present

## 2017-06-11 NOTE — Telephone Encounter (Signed)
Appt has been scheduled for the pt to see Dr. Burr Medico on 4/18 at 230pm. Pt aware to arrive 30 minutes early.

## 2017-06-12 ENCOUNTER — Other Ambulatory Visit: Payer: Self-pay | Admitting: Gastroenterology

## 2017-06-12 ENCOUNTER — Other Ambulatory Visit: Payer: Self-pay

## 2017-06-12 ENCOUNTER — Encounter (HOSPITAL_COMMUNITY): Payer: Self-pay | Admitting: Emergency Medicine

## 2017-06-12 DIAGNOSIS — K869 Disease of pancreas, unspecified: Secondary | ICD-10-CM | POA: Diagnosis not present

## 2017-06-12 NOTE — Progress Notes (Signed)
[place]  [phone] [phone]  Clinic New Consult Note   Patient Care Team: Darcus Austin, MD as PCP - General (Family Medicine).  Date of Service:  06/14/2017  Referring physician: Dr. Paulita Fujita  CHIEF COMPLAINTS/PURPOSE OF CONSULTATION:  Pancreatic Cancer, stage III   Oncology History   Cancer Staging Pancreatic cancer [facility]) Staging form: Exocrine Pancreas, AJCC 8th Edition - Clinical stage from 06/13/2017: Stage III (cT4, cN0, cM0) - Signed by Truitt Merle, MD on 06/13/2017       Pancreatic cancer ([place])   06/13/2017 Initial Diagnosis    Pancreatic cancer ([place])      06/13/2017 Cancer Staging    Staging form: Exocrine Pancreas, AJCC 8th Edition - Clinical stage from 06/13/2017: Stage III (cT4, cN0, cM0) - Signed by Truitt Merle, MD on 06/13/2017      06/13/2017 Procedure    EUS by Dr. Paulita Fujita 06/13/17  IMPRESSION:  -There was no sign of significant pathology in the ampulla. - There was no evidence of significant pathology in the left lobe of the liver. - A mass was identified in the pancreatic head. Abutment SMV; invasion portal confluence; no adenopathy. Fine needle aspiration performed. If this is a pancreatic adenocarcinoma, it would be staged T4/N0/Mx by EUS.      06/13/2017 Initial Biopsy    Diagnosis 06/13/17 FINE NEEDLE ASPIRATION, ENDOSCOPIC, PANCREAS HEAD (SPECIMEN 1 OF 1 COLLECTED 06/13/17): ADENOCARCINOMA. Preliminary Diagnosis Intraoperative Diagnosis: 1-3) ATYPICAL CELLS, ADDITIONAL MATERIAL REQUESTED. (NDK) 4-6 ADENOCARCINOMA. (NDK) Reported to Dr. Paulita Fujita on 06/13/17 @ 11:07am.         HISTORY OF PRESENTING ILLNESS: 06/14/17 Carly [name] 77 y.o. female is a here because of newly diagnosed Pancreatic Cancer. The patient was referred by Dr. Paulita Fujita. The patient presents to the clinic today accompanied by her 2 sons and her daughter-in-law.   She was having aching of her left abdominal starting 3 months ago after UTI.  This pain comes and goes, which worsened at night. She denies issues with her appetite overall but she does not eat as much as she did due to the recent death of her husband. She denies any abdominal bloating. The color of urine has not become dark.   Today the patient notes she still has left abdominal pain. She notes she plans to see her grandson in [place] this weekend. She plans to go to the beach for 1-2 weeks in May, 2018. She prefers to be reached by her home phone and then her daughter-in-law.  Socially she lives by herself since her husband's passing in 12/2016. Her house is 1 level. She has support from her son and his wife. She has good family support. She has a stationary bike he rides 30 minutes a day and keeps track of her steps per day. She is very active and goes out often. 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   In the past the patient had a complete hysterectomy in early 1981 for bleeding. During her surgery she had cancer cells found and had a BSO. She had no following treatments. She had a pelvic rebuilt in 1990s due to prolapse. She had a yearly pap smear. She is diagnosed os type II DM but is not on any medication at this time.  She takes tramadol for the pain as needed, trazodone to help her sleep and takes XANAX for her anxiety. She has been on this for years. She has been on Trazodone for 40  years to also help her anxiety. She takes Lipitor for her high cholesterol, not regularly. She takes HCTZ for her fluid retention which she takes as needed. She takes Lisinopril for her HTN. She takes Synthroid for her hypothyroidism.  Her maternal grandmother had breast cancer. Her maternal cousin had breast cancer and her paternal cousin had a blood disorder. She smoked for 10-15 years of 1/2-1 pack a day before stopping in 1980s.   On review of symptoms, pt notes left abdominal ache which she tried tylenol and she was recently given Tramadol. This  helped her some. She denies abdominal bloating, dark urine or jaundice and denies dysuria.     MEDICAL HISTORY:  Past Medical History:  Diagnosis Date  . Allergic rhinitis    Skin Test 04-14-2009  . Asthma   . Cancer ([place]) 1981   adenocarcinoma of uterus  . Diabetes mellitus without complication ([place])    type II, no meds per pt  . Hyperlipemia   . Hypertension   . Insomnia     SURGICAL HISTORY: Past Surgical History:  Procedure Laterality Date  . ABDOMINAL HYSTERECTOMY    . CARPAL TUNNEL RELEASE    . EUS N/A 06/13/2017   Procedure: FULL UPPER ENDOSCOPIC ULTRASOUND (EUS) RADIAL;  Surgeon: Arta Silence, MD;  Location: [facility];  Service: Endoscopy;  Laterality: N/A;  . I&D EXTREMITY  09/17/2011   Procedure: IRRIGATION AND DEBRIDEMENT EXTREMITY;  Surgeon: Roseanne Kaufman, MD;  Location: [facility];  Service: Orthopedics;  Laterality: Left;  with drain placement  . NASAL SEPTOPLASTY W/ TURBINOPLASTY    . pelvic floor rebuild    . TONSILLECTOMY    . TOTAL ABDOMINAL HYSTERECTOMY W/ BILATERAL SALPINGOOPHORECTOMY      SOCIAL HISTORY: Social History   Socioeconomic History  . Marital status: Widowed    Spouse name: Not on file  . Number of children: Not on file  . Years of education: Not on file  . Highest education level: Not on file  Occupational History  . Not on file  Social Needs  . Financial resource strain: Not on file  . Food insecurity:    Worry: Not on file    Inability: Not on file  . Transportation needs:    Medical: Not on file    Non-medical: Not on file  Tobacco Use  . Smoking status: Former Smoker    Packs/day: 0.50    Years: 15.00    Pack years: 7.50    Types: Cigarettes    Last attempt to quit: 02/27/1978    Years since quitting: 39.3  . Smokeless tobacco: Never Used  Substance and Sexual Activity  . Alcohol use: Yes    Alcohol/week: 0.0 oz    Comment: occ- glass of wine  . Drug use: No  . Sexual activity: Yes    Comment: 1st intercouse- 15,  partners- 1  Lifestyle  . Physical activity:    Days per week: Not on file    Minutes per session: Not on file  . Stress: Not on file  Relationships  . Social connections:    Talks on phone: Not on file    Gets together: Not on file    Attends religious service: Not on file    Active member of club or organization: Not on file    Attends meetings of clubs or organizations: Not on file    Relationship status: Not on file  . Intimate partner violence:    Fear of current or ex partner: Not on  file    Emotionally abused: Not on file    Physically abused: Not on file    Forced sexual activity: Not on file  Other Topics Concern  . Not on file  Social History Narrative  . Not on file    FAMILY HISTORY: Family History  Problem Relation Age of Onset  . Stroke Mother   . Heart attack Father   . Breast cancer Maternal Grandmother   . Breast cancer Cousin   . Cancer Cousin        oral cancer     ALLERGIES:  is allergic to cephalexin and nabumetone.  MEDICATIONS:  Current Outpatient Medications  Medication Sig Dispense Refill  . ALPRAZolam (XANAX) 0.5 MG tablet Take 1 tablet by mouth Once daily as needed for anxiety.     . Ascorbic Acid (VITAMIN C) 500 MG PACK Take 100 mg by mouth daily.    Marland Kitchen atorvastatin (LIPITOR) 10 MG tablet Take 10 mg by mouth at bedtime.     Marland Kitchen CALCIUM PO Take 1 tablet by mouth daily.     . Cholecalciferol (VITAMIN D) 2000 units CAPS Take by mouth.    . Cyanocobalamin (VITAMIN B-12 PO) Take 1 tablet by mouth daily.     . Glucosamine-Chondroit-Vit C-Mn (GLUCOSAMINE 1500 COMPLEX PO) Take 1 capsule by mouth daily.      . hydrochlorothiazide (HYDRODIURIL) 25 MG tablet Take 1 tablet by mouth daily as needed.     . hyoscyamine (LEVBID) 0.375 MG 12 hr tablet Take 0.375 mg by mouth every 12 (twelve) hours as needed for cramping.     . latanoprost (XALATAN) 0.005 % ophthalmic solution 1 drop at bedtime.    Marland Kitchen levothyroxine (SYNTHROID, LEVOTHROID) 25 MCG tablet Take 25  mcg by mouth daily before breakfast.     . lisinopril (PRINIVIL,ZESTRIL) 10 MG tablet Take 10 mg by mouth at bedtime.    . Omega-3 Fatty Acids (FISH OIL PO) Take 1 capsule by mouth daily.     Marland Kitchen omeprazole (PRILOSEC) 20 MG capsule Take 1 tablet by mouth daily.    . traZODone (DESYREL) 100 MG tablet Take 1 tablet (100 mg total) by mouth at bedtime. (Patient taking differently: Take 50 mg by mouth at bedtime. ) 90 tablet 3   No current facility-administered medications for this visit.     REVIEW OF SYSTEMS:   Constitutional: Denies fevers, chills or abnormal night sweats Eyes: Denies blurriness of vision, double vision or watery eyes Ears, nose, mouth, throat, and face: Denies mucositis or sore throat Respiratory: Denies cough, dyspnea or wheezes Cardiovascular: Denies palpitation, chest discomfort or lower extremity swelling Gastrointestinal:  Denies nausea, heartburn or change in bowel habits (+) left abdominal pain  Skin: Denies abnormal skin rashes Lymphatics: Denies new lymphadenopathy or easy bruising Neurological:Denies numbness, tingling or new weaknesses Behavioral/Psych: Mood is stable, no new changes  All other systems were reviewed with the patient and are negative.  PHYSICAL EXAMINATION: ECOG PERFORMANCE STATUS: 1 - Symptomatic but completely ambulatory  Vitals:   06/14/17 1425  BP: (!) 159/80  Pulse: (!) 104  Resp: 18  Temp: 98.4 F (36.9 C)  SpO2: 98%   Filed Weights   06/14/17 1425  Weight: 151 lb 14.4 oz (68.9 kg)    GENERAL:alert, no distress and comfortable SKIN: skin color, texture, turgor are normal, no rashes or significant lesions EYES: normal, conjunctiva are pink and non-injected, sclera clear OROPHARYNX:no exudate, no erythema and lips, buccal mucosa, and tongue normal  NECK: supple, thyroid normal  size, non-tender, without nodularity LYMPH:  no palpable lymphadenopathy in the cervical, axillary or inguinal LUNGS: clear to auscultation and  percussion with normal breathing effort HEART: regular rhythm and no murmurs and no lower extremity edema (+) increased heart rate ABDOMEN:abdomen soft, non-tender and normal bowel sounds Musculoskeletal:no cyanosis of digits and no clubbing  PSYCH: alert & oriented x 3 with fluent speech NEURO: no focal motor/sensory deficits  LABORATORY DATA:  I have reviewed the data as listed CBC Latest Ref Rng & Units 09/19/2011 09/17/2011 09/17/2011  WBC 4.0 - 10.5 K/uL 5.9 - 9.3  Hemoglobin 12.0 - 15.0 g/dL 13.9 13.9 14.7  Hematocrit 36.0 - 46.0 % 40.0 41.0 41.5  Platelets 150 - 400 K/uL 184 - 143(L)    CMP Latest Ref Rng & Units 09/19/2011 09/17/2011  Glucose 70 - 99 mg/dL 104(H) 111(H)  BUN 6 - 23 mg/dL 11 11  Creatinine 0.50 - 1.10 mg/dL 0.80 1.00  Sodium 135 - 145 mEq/L 139 139  Potassium 3.5 - 5.1 mEq/L 4.0 4.3  Chloride 96 - 112 mEq/L 103 103  CO2 19 - 32 mEq/L 28 -  Calcium 8.4 - 10.5 mg/dL 9.4 -    PATHOLOGY   Diagnosis 06/13/17 FINE NEEDLE ASPIRATION, ENDOSCOPIC, PANCREAS HEAD (SPECIMEN 1 OF 1 COLLECTED 06/13/17): ADENOCARCINOMA. Preliminary Diagnosis Intraoperative Diagnosis: 1-3) ATYPICAL CELLS, ADDITIONAL MATERIAL REQUESTED. (NDK) 4-6 ADENOCARCINOMA. (NDK) Reported to Dr. Paulita Fujita on 06/13/17 @ 11:07am.    PROCEDURES  EUS by Dr. Paulita Fujita 06/13/17  IMPRESSION:  -There was no sign of significant pathology in the ampulla. - There was no evidence of significant pathology in the left lobe of the liver. - A mass was identified in the pancreatic head. Abutment SMV; invasion portal confluence; no adenopathy. Fine needle aspiration performed. If this is a pancreatic adenocarcinoma, it would be staged T4/N0/Mx by EUS.   RADIOGRAPHIC STUDIES: I have personally reviewed the radiological images as listed and agreed with the findings in the report. Ct Abdomen Pelvis W Contrast  Result Date: 06/08/2017 CLINICAL DATA:  Left flank pain, left lower quadrant pain. EXAM: CT ABDOMEN AND PELVIS  WITH CONTRAST TECHNIQUE: Multidetector CT imaging of the abdomen and pelvis was performed using the standard protocol following bolus administration of intravenous contrast. CONTRAST:  119m ISOVUE-300 IOPAMIDOL (ISOVUE-300) INJECTION 61% Creatinine was obtained on site at [facility] [address] Results: Creatinine 0.7 mg/dL. COMPARISON:  CT 01/03/2013 FINDINGS: Lower chest: Heart is mildly enlarged. Linear atelectasis or scarring in the lingula. No effusions. Hepatobiliary: No focal hepatic abnormality. Gallbladder unremarkable. Pancreas: Low-density mass noted within the pancreatic head is measures 2.6 x 2.2 cm concerning for pancreatic cancer. Associated pancreatic ductal dilatation, measuring up to 8 mm in the pancreatic body. Pancreatic atrophy in the body and tail. No biliary ductal dilatation. Spleen: No focal abnormality.  Normal size. Adrenals/Urinary Tract: Bilateral renal parapelvic cysts. No hydronephrosis or focal renal abnormality. No adrenal mass. Urinary bladder unremarkable. Stomach/Bowel: Descending colonic and sigmoid diverticulosis. Scattered right colonic diverticula as well. Slight stranding noted around the distal descending colon may reflect early active diverticulitis. Vascular/Lymphatic: Aortic atherosclerosis. No enlarged abdominal or pelvic lymph nodes. 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 Remainder the portal vein is patent. Reproductive: Is Prior hysterectomy.  No adnexal masses. Other: No free fluid or free air. Musculoskeletal: No acute bony abnormality. IMPRESSION: Irregular solid hypoechoic mass within the pancreatic head, 2.6 x 2.2 cm with associated pancreatic ductal dilatation and pancreatic  atrophy, most compatible with pancreatic cancer. 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 Diffuse colonic diverticulosis. Slight  stranding around the distal descending colon may reflect early active diverticulitis. Bilateral renal parapelvic cysts. Aortic atherosclerosis. These results were called by telephone at the time of interpretation on 06/08/2017 at 10:41 am to Dr. London Pepper as Dr. Inda Merlin is not available, who verbally acknowledged these results. Electronically Signed   By: Rolm Baptise M.D.   On: 06/08/2017 10:50   Dg Bone Density  Result Date: 05/16/2017 EXAM: DUAL X-RAY ABSORPTIOMETRY (DXA) FOR BONE MINERAL DENSITY IMPRESSION: Referring Physician:  Uvaldo Rising PATIENT: Name: Carly [name], Carly [name] Patient ID: [id number] Birth Date: 02/14/1941 Height: 62.0 in. Sex: Female Measured: 05/16/2017 Weight: 150.1 lbs. Indications: Advanced Age, Bilateral Ovariectomy (65.51), Caucasian, Estrogen Deficient, Height Loss (781.91), History of Osteopenia, Hypothyroid, Hysterectomy, Low Calcium Intake (269.3), Postmenopausal, Prilosec, Secondary Osteoporosis, Synthroid Fractures: None Treatments: None ASSESSMENT: The BMD measured at Femur Neck Left is 0.800 g/cm2 with a T-score of -1.7. This patient is considered OSTEOPENIC according to [facility]) criteria. L-3 and L-4 were excluded due to degenerative changes. Spine was not compared to prior study due to exclusion of vertebral bodies on current exam. There has been no statistically significant change in BMD of Left hip since prior exam dated 04/22/2015. Site Region Measured Date Measured Age YA T-score BMD Significant CHANGE DualFemur Neck Left 05/16/2017    76.6         -1.7    0.800 g/cm2 AP Spine  L1-L2     4/16/2017    77.6         1.8     1.266 g/cm2 World Health Organization [facility]) criteria for post-menopausal, Caucasian Women: Normal       T-score at or above -1 SD Osteopenia   T-score between -1 and -2.5 SD Osteoporosis T-score at or below -2.5 SD RECOMMENDATION: [place] recommends that FDA-approved medical therapies be considered in  postmenopausal women and men age 12 or older with a: 1. Hip or vertebral (clinical or morphometric) fracture. 2. T-score of less than or equal to -2.5 at the spine or hip. 3. Ten-year fracture probability by FRAX of 3% or greater for hip fracture or 20% or greater for major osteoporotic fracture. All treatment decisions require clinical judgment and consideration of individual patient factors, including patient preferences, co-morbidities, previous drug use, risk factors not captured in the FRAX model (e.g. falls, vitamin D deficiency, increased bone turnover, interval significant decline in bone density) and possible under- or over-estimation of fracture risk by FRAX. All patients should ensure an adequate intake of dietary calcium (1200 mg/d) and vitamin D (800 IU daily) unless contraindicated. FOLLOW-UP: People with diagnosed cases of osteoporosis or at high risk for fracture should have regular bone mineral density tests. For patients eligible for Medicare, routine testing is allowed once every 2 years. The testing frequency can be increased to one year for patients who have rapidly progressing disease, those who are receiving or discontinuing medical therapy to restore bone mass, or have additional risk factors. I have reviewed this report and agree with the above findings. Mark A. Thornton Papas, M.D. [facility] FRAX* 10-year Probability of Fracture Based on femoral neck BMD: DualFemur (Left) Major Osteoporotic Fracture: 13.3% Hip Fracture:                3.3% Population:  [place] (Caucasian) Risk Factors:                Secondary Osteoporosis *FRAX is a Materials engineer of the [employer] for Metabolic Bone Disease, a [place] (WHO) [facility]. ASSESSMENT: The probability of a major osteoporotic fracture is 13.3 % within the next ten years. The probability of hip fracture is  3.3  % within the next 10 years. I have reviewed this report and  agree with the above findings. Mark A. Thornton Papas, M.D. [facility] Electronically Signed   By: Lavonia Dana M.D.   On: 05/16/2017 12:33    ASSESSMENT & PLAN:  Carly [name] is a 77 y.o. Caucasian female with a history of hypothyroidism, GERD, Anxiety, osteopenia, Glaucoma, HTN, hypercholesterolemia, and H/o Uterine Cancer, presented with intermittent upper abdominal pain    1. 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  -She has locally advanced Adenocarcinoma of Pancreatic Head.  The EUS showed a 3.0 x 3.0 cm mass in the pancreatic head, with sonographic evidence suggesting invasion into the superior mesenteric artery, the portal vein,, the SMV, and splenic vein.  Unfortunately this is likely unresectable tumor, or at least a borderline resectable disease due to the invasion of SMA. -I recommend a CT chest scan to complete his staging. She agreed. -I will discuss her case in next week's GI Tumor Board to review all her options of treatment and discuss her resectability by surgery.  -I discussed the aggressive nature of pancreatic adenocarcinoma, and overall high risk of recurrence even after complete surgical resection. Surgery is not recommended for initial treatment. She will likely undergo chemotherapy first to either shrink or control her disease.  -We discussed the role of neoadjuvant, or palliative chemotherapy if her cancer is not resectable.  If not resectable, the goal of therapy is palliative, to prolong her life and improve her quality of life. -Given her overall good baseline health, I think she is a candidate for intensive chemotherapy such as FOLFIRINOX every 2 weeks, or gemcitabine and Abraxane weekly for 2 weeks on and 1 week off. If she is not willing to go through intensive chemo, the single agent gemcitabine or palliative care alone would be options.   -We  discussed all potential side effects of the above regimens, she voiced good understanding and will think about it. -I will also ask pathology to do an INR on her biopsy sample if we have enough tissue, to see if she is a candidate for immunotherapy. -If she proceeds with chemotherapy she would need PAC placement and Chemo education class prior to starting.  -I encouraged her to remain active and continue to eat well. I reviewed the available clinic's resources including dietician, GI navigator, Social Work, GI cancer support group and any needed transportation.  -She will continue Tramadol as needed for her Left abdominal pain.  -I advised her to watch for dark urine color or jaundice of her eyes or skin.  -She met with GI navigator Dawn today to review her diagnosis and next steps -F/u in 2 weeks with lab and chemo class   2. H/o Uterine Cancer, Stage I, diagnosed in 51 -She had a total hysterectomy and BSO in 1981. She had no following treatments -Followed by her Gynecologist with yearly pap smears.   3. Genetic Testing  -Given her history of uterine cancer and family  history of breast cancer she is eligible for genetic testing.  -She is interested. I will send genetic counseling referral.   4. Social Support  -She lives alone but has good familial support.  -I recommend she has someone check on her weekly especially when on chemo treatment.    PLAN:  -CT chest wo contrast in one week -Lab, f/u and chem class, dietician consult the week of 4/29  -I will present her case in our GI team over the next week, and will call her after the conference.  If she agrees with chemo, I will order port placement by IR next week.   No orders of the defined types were placed in this encounter.   All questions were answered. The patient knows to call the clinic with any problems, questions or concerns. I spent 55 minutes counseling the patient face to face. The total time spent in the appointment  was 60 minutes and more than 50% was on counseling.     Truitt Merle, MD 06/14/2017 3:40 PM   This document serves as a record of services personally performed by Truitt Merle, MD. It was created on her behalf by Joslyn Devon, a trained medical scribe. The creation of this record is based on the scribe's personal observations and the provider's statements to them.   I have reviewed the above documentation for accuracy and completeness, and I agree with the above.

## 2017-06-13 ENCOUNTER — Other Ambulatory Visit: Payer: Self-pay

## 2017-06-13 ENCOUNTER — Ambulatory Visit (HOSPITAL_COMMUNITY): Payer: Medicare Other | Admitting: Certified Registered"

## 2017-06-13 ENCOUNTER — Ambulatory Visit (HOSPITAL_COMMUNITY)
Admission: RE | Admit: 2017-06-13 | Discharge: 2017-06-13 | Disposition: A | Discharge status: Home / Self Care | Payer: Medicare Other | Source: Ambulatory Visit | Attending: Gastroenterology | Admitting: Gastroenterology

## 2017-06-13 ENCOUNTER — Encounter (HOSPITAL_COMMUNITY): Payer: Self-pay | Admitting: Emergency Medicine

## 2017-06-13 ENCOUNTER — Encounter (HOSPITAL_COMMUNITY)
Admission: RE | Disposition: A | Discharge status: Home / Self Care | Payer: Self-pay | Source: Ambulatory Visit | Attending: Gastroenterology

## 2017-06-13 DIAGNOSIS — K219 Gastro-esophageal reflux disease without esophagitis: Secondary | ICD-10-CM | POA: Diagnosis not present

## 2017-06-13 DIAGNOSIS — I10 Essential (primary) hypertension: Secondary | ICD-10-CM | POA: Diagnosis not present

## 2017-06-13 DIAGNOSIS — E7800 Pure hypercholesterolemia, unspecified: Secondary | ICD-10-CM | POA: Insufficient documentation

## 2017-06-13 DIAGNOSIS — Z79899 Other long term (current) drug therapy: Secondary | ICD-10-CM | POA: Insufficient documentation

## 2017-06-13 DIAGNOSIS — Z7982 Long term (current) use of aspirin: Secondary | ICD-10-CM | POA: Diagnosis not present

## 2017-06-13 DIAGNOSIS — E039 Hypothyroidism, unspecified: Secondary | ICD-10-CM | POA: Diagnosis not present

## 2017-06-13 DIAGNOSIS — R1010 Upper abdominal pain, unspecified: Secondary | ICD-10-CM | POA: Diagnosis not present

## 2017-06-13 DIAGNOSIS — Z87891 Personal history of nicotine dependence: Secondary | ICD-10-CM | POA: Insufficient documentation

## 2017-06-13 DIAGNOSIS — E119 Type 2 diabetes mellitus without complications: Secondary | ICD-10-CM | POA: Diagnosis not present

## 2017-06-13 DIAGNOSIS — C259 Malignant neoplasm of pancreas, unspecified: Secondary | ICD-10-CM | POA: Insufficient documentation

## 2017-06-13 DIAGNOSIS — J45909 Unspecified asthma, uncomplicated: Secondary | ICD-10-CM | POA: Diagnosis not present

## 2017-06-13 DIAGNOSIS — K8689 Other specified diseases of pancreas: Secondary | ICD-10-CM | POA: Diagnosis not present

## 2017-06-13 DIAGNOSIS — R933 Abnormal findings on diagnostic imaging of other parts of digestive tract: Secondary | ICD-10-CM | POA: Diagnosis not present

## 2017-06-13 DIAGNOSIS — C250 Malignant neoplasm of head of pancreas: Secondary | ICD-10-CM | POA: Insufficient documentation

## 2017-06-13 DIAGNOSIS — R634 Abnormal weight loss: Secondary | ICD-10-CM | POA: Diagnosis not present

## 2017-06-13 HISTORY — DX: Essential (primary) hypertension: I10

## 2017-06-13 HISTORY — DX: Type 2 diabetes mellitus without complications: E11.9

## 2017-06-13 HISTORY — PX: EUS: SHX5427

## 2017-06-13 LAB — GLUCOSE, CAPILLARY: GLUCOSE-CAPILLARY: 158 mg/dL — AB (ref 65–99)

## 2017-06-13 SURGERY — ULTRASOUND, UPPER GI TRACT, ENDOSCOPIC
Anesthesia: Monitor Anesthesia Care

## 2017-06-13 MED ORDER — LIDOCAINE 2% (20 MG/ML) 5 ML SYRINGE
INTRAMUSCULAR | Status: DC | PRN
  Administered 2017-06-13: 50 mg via INTRAVENOUS

## 2017-06-13 MED ORDER — PROPOFOL 10 MG/ML IV BOLUS
INTRAVENOUS | Status: AC
  Filled 2017-06-13: qty 20

## 2017-06-13 MED ORDER — LACTATED RINGERS IV SOLN
INTRAVENOUS | Status: DC | PRN
  Administered 2017-06-13 (×2): via INTRAVENOUS

## 2017-06-13 MED ORDER — ONDANSETRON HCL 4 MG/2ML IJ SOLN
INTRAMUSCULAR | Status: DC | PRN
  Administered 2017-06-13: 4 mg via INTRAVENOUS

## 2017-06-13 MED ORDER — PROPOFOL 10 MG/ML IV BOLUS
INTRAVENOUS | Status: AC
  Filled 2017-06-13: qty 40

## 2017-06-13 MED ORDER — PROPOFOL 500 MG/50ML IV EMUL
INTRAVENOUS | Status: DC | PRN
  Administered 2017-06-13: 125 ug/kg/min via INTRAVENOUS

## 2017-06-13 MED ORDER — PROPOFOL 10 MG/ML IV BOLUS
INTRAVENOUS | Status: DC | PRN
  Administered 2017-06-13: 30 mg via INTRAVENOUS
  Administered 2017-06-13: 20 mg via INTRAVENOUS

## 2017-06-13 NOTE — H&P (Signed)
Patient interval history reviewed.  Patient examined again.  There has been no change from documented H/P dated 06/12/17 (scanned into chart from our office) except as documented above.  Assessment:  1.  Pancreatic mass.  Plan:  1.  Endoscopic ultrasound with possible fine needle aspiration. 2.  Risks (bleeding, infection, bowel perforation that could require surgery, sedation-related changes in cardiopulmonary systems), benefits (identification and possible treatment of source of symptoms, exclusion of certain causes of symptoms), and alternatives (watchful waiting, radiographic imaging studies, empiric medical treatment) of upper endoscopy with ultrasound and possible fine needle aspiration (EUS +/- FNA) were explained to patient/family in detail and patient wishes to proceed.

## 2017-06-13 NOTE — Anesthesia Preprocedure Evaluation (Signed)
Anesthesia Evaluation  Patient identified by MRN, date of birth, ID band Patient awake    Reviewed: Allergy & Precautions, NPO status , Patient's Chart, lab work & pertinent test results  Airway Mallampati: II  TM Distance: >3 FB Neck ROM: Full    Dental no notable dental hx. (+) Edentulous Upper, Edentulous Lower   Pulmonary asthma , former smoker,    Pulmonary exam normal breath sounds clear to auscultation       Cardiovascular hypertension, Pt. on medications Normal cardiovascular exam Rhythm:Regular Rate:Normal     Neuro/Psych negative neurological ROS  negative psych ROS   GI/Hepatic negative GI ROS, Neg liver ROS,   Endo/Other  diabetes  Renal/GU negative Renal ROS  negative genitourinary   Musculoskeletal negative musculoskeletal ROS (+)   Abdominal   Peds negative pediatric ROS (+)  Hematology negative hematology ROS (+)   Anesthesia Other Findings   Reproductive/Obstetrics negative OB ROS                             Anesthesia Physical Anesthesia Plan  ASA: II  Anesthesia Plan: MAC   Post-op Pain Management:    Induction:   PONV Risk Score and Plan: 2 and Ondansetron and Treatment may vary due to age or medical condition  Airway Management Planned: Nasal Cannula  Additional Equipment:   Intra-op Plan:   Post-operative Plan:   Informed Consent: I have reviewed the patients History and Physical, chart, labs and discussed the procedure including the risks, benefits and alternatives for the proposed anesthesia with the patient or authorized representative who has indicated his/her understanding and acceptance.   Dental advisory given  Plan Discussed with:   Anesthesia Plan Comments:         Anesthesia Quick Evaluation

## 2017-06-13 NOTE — Discharge Instructions (Signed)
Esophagogastroduodenoscopy, Care After °Refer to this sheet in the next few weeks. These instructions provide you with information about caring for yourself after your procedure. Your health care provider may also give you more specific instructions. Your treatment has been planned according to current medical practices, but problems sometimes occur. Call your health care provider if you have any problems or questions after your procedure. °What can I expect after the procedure? °After the procedure, it is common to have: °· A sore throat. °· Nausea. °· Bloating. °· Dizziness. °· Fatigue. ° °Follow these instructions at home: °· Do not eat or drink anything until the numbing medicine (local anesthetic) has worn off and your gag reflex has returned. You will know that the local anesthetic has worn off when you can swallow comfortably. °· Do not drive for 24 hours if you received a medicine to help you relax (sedative). °· If your health care provider took a tissue sample for testing during the procedure, make sure to get your test results. This is your responsibility. Ask your health care provider or the department performing the test when your results will be ready. °· Keep all follow-up visits as told by your health care provider. This is important. °Contact a health care provider if: °· You cannot stop coughing. °· You are not urinating. °· You are urinating less than usual. °Get help right away if: °· You have trouble swallowing. °· You cannot eat or drink. °· You have throat or chest pain that gets worse. °· You are dizzy or light-headed. °· You faint. °· You have nausea or vomiting. °· You have chills. °· You have a fever. °· You have severe abdominal pain. °· You have black, tarry, or bloody stools. °This information is not intended to replace advice given to you by your health care provider. Make sure you discuss any questions you have with your health care provider. °Document Released: 01/31/2012 Document  Revised: 07/22/2015 Document Reviewed: 01/07/2015 °Elsevier Interactive Patient Education © 2018 [employer] ° °

## 2017-06-13 NOTE — Op Note (Signed)
[facility] Patient Name: Carly [name] Procedure Date: 06/13/2017 MRN: [id number] Attending MD: Arta Silence , MD Date of Birth: 1940-04-25 CSN: [id number] Age: 77 Admit Type: Outpatient Procedure:                Upper EUS Indications:              Suspected mass in pancreas on CT scan, Upper                            abdominal pain, Weight loss Providers:                Arta Silence, MD, Vista Lawman, RN, Charolette Child, Technician Referring MD:             Darcus Austin, MD Medicines:                Monitored Anesthesia Care Complications:            No immediate complications. Estimated Blood Loss:     Estimated blood loss was minimal. Procedure:                Pre-Anesthesia Assessment:                           - Prior to the procedure, a History and Physical                            was performed, and patient medications and                            allergies were reviewed. The patient's tolerance of                            previous anesthesia was also reviewed. The risks                            and benefits of the procedure and the sedation                            options and risks were discussed with the patient.                            All questions were answered, and informed consent                            was obtained. Prior Anticoagulants: The patient has                            taken aspirin. ASA Grade Assessment: III - A                            patient with severe systemic disease. After  reviewing the risks and benefits, the patient was                            deemed in satisfactory condition to undergo the                            procedure.                           After obtaining informed consent, the endoscope was                            passed under direct vision. Throughout the                            procedure, the patient's blood pressure, pulse, and                             oxygen saturations were monitored continuously. The                            [id number] ([id number]) scope was introduced through                            the mouth, and advanced to the second part of                            duodenum. The upper EUS was accomplished without                            difficulty. The patient tolerated the procedure                            well. Scope In: Scope Out: Findings:      ENDOSONOGRAPHIC FINDING: :      There was no sign of significant endosonographic abnormality in the       ampulla.      There was no sign of significant endosonographic abnormality in the left       lobe of the liver.      An irregular mass was identified in the pancreatic head. The mass was       hypoechoic. The mass measured 30 mm by 30 mm in maximal cross-sectional       diameter. The endosonographic borders were poorly-defined. There was       sonographic evidence suggesting invasion into the superior mesenteric       artery (manifested by abutment), the portal vein (manifested by       invasion), the superior mesenteric vein (manifested by invasion) and the       splenic vein (manifested by invasion). An intact interface was seen       between the mass and the celiac trunk suggesting a lack of invasion. The       remainder of the pancreas was examined. The endosonographic appearance       of parenchyma and the upstream pancreatic duct indicated duct dilation       and a maximum duct diameter  of 8 mm. No lymphadenopathy seen. Fine       needle aspiration for cytology was performed. Color Doppler imaging was       utilized prior to needle puncture to confirm a lack of significant       vascular structures within the needle path. Six passes were made with       the 22 gauge needle and with the 25 gauge needle using a transduodenal       approach. A stylet was used. A cytologist was present and performed a       preliminary cytologic  examination. The cellularity of the specimen was       adequate. Final cytology results are pending. Impression:               - There was no sign of significant pathology in the                            ampulla.                           - There was no evidence of significant pathology in                            the left lobe of the liver.                           - A mass was identified in the pancreatic head.                            Abutment SMV; invasion portal confluence; no                            adenopathy. Fine needle aspiration performed. If                            this is a pancreatic adenocarcinoma, it would be                            staged T4/N0/Mx by EUS. Moderate Sedation:      N/A- Per Anesthesia Care Recommendation:           - Discharge patient to home (via wheelchair).                           - Resume previous diet today.                           - Continue present medications.                           - Await cytology results.                           - Return to GI clinic PRN.                           - Based on EUS appearance, lesion is not amenable  to upfront surgical resection; accordingly will                            refer to an oncologist at appointment to be                            scheduled.                           - Return to referring physician as previously                            scheduled. Procedure Code(s):        --- Professional ---                           [id number], Esophagogastroduodenoscopy, flexible,                            transoral; with transendoscopic ultrasound-guided                            intramural or transmural fine needle                            aspiration/biopsy(s), (includes endoscopic                            ultrasound examination limited to the esophagus,                            stomach or duodenum, and adjacent structures) Diagnosis Code(s):        ---  Professional ---                           K86.89, Other specified diseases of pancreas                           R10.10, Upper abdominal pain, unspecified                           R63.4, Abnormal weight loss                           R93.3, Abnormal findings on diagnostic imaging of                            other parts of digestive tract CPT copyright 2017 [employer]. All rights reserved. The codes documented in this report are preliminary and upon coder review may  be revised to meet current compliance requirements. Arta Silence, MD 06/13/2017 11:07:01 AM This report has been signed electronically. Number of Addenda: 0

## 2017-06-13 NOTE — Anesthesia Postprocedure Evaluation (Signed)
Anesthesia Post Note  Patient: Carly [name]  Procedure(s) Performed: FULL UPPER ENDOSCOPIC ULTRASOUND (EUS) RADIAL (N/A )     Patient location during evaluation: Endoscopy Anesthesia Type: MAC Level of consciousness: awake and alert Pain management: pain level controlled Vital Signs Assessment: post-procedure vital signs reviewed and stable Respiratory status: spontaneous breathing, nonlabored ventilation, respiratory function stable and patient connected to nasal cannula oxygen Cardiovascular status: stable and blood pressure returned to baseline Postop Assessment: no apparent nausea or vomiting Anesthetic complications: no    Last Vitals:  Vitals:   06/13/17 1110 06/13/17 1120  BP: 136/72 140/80  Pulse: 99 90  Resp: 20 17  Temp:    SpO2: 99% 97%    Last Pain:  Vitals:   06/13/17 1109  TempSrc: Oral                 Montez Hageman

## 2017-06-13 NOTE — Transfer of Care (Signed)
Immediate Anesthesia Transfer of Care Note  Patient: Carly [name]  Procedure(s) Performed: FULL UPPER ENDOSCOPIC ULTRASOUND (EUS) RADIAL (N/A )  Patient Location: PACU  Anesthesia Type:MAC  Level of Consciousness: awake, alert  and oriented  Airway & Oxygen Therapy: Patient Spontanous Breathing and Patient connected to nasal cannula oxygen  Post-op Assessment: Report given to RN and Post -op Vital signs reviewed and stable  Post vital signs: Reviewed and stable  Last Vitals:  Vitals Value Taken Time  BP    Temp    Pulse    Resp    SpO2      Last Pain:  Vitals:   06/13/17 0850  TempSrc: Oral         Complications: No apparent anesthesia complications

## 2017-06-14 ENCOUNTER — Telehealth: Payer: Self-pay | Admitting: Hematology

## 2017-06-14 ENCOUNTER — Encounter: Payer: Self-pay | Admitting: Hematology

## 2017-06-14 ENCOUNTER — Inpatient Hospital Stay: Payer: Medicare Other | Attending: Hematology | Admitting: Hematology

## 2017-06-14 DIAGNOSIS — I10 Essential (primary) hypertension: Secondary | ICD-10-CM | POA: Diagnosis not present

## 2017-06-14 DIAGNOSIS — C250 Malignant neoplasm of head of pancreas: Secondary | ICD-10-CM

## 2017-06-14 DIAGNOSIS — E119 Type 2 diabetes mellitus without complications: Secondary | ICD-10-CM | POA: Diagnosis not present

## 2017-06-14 DIAGNOSIS — E039 Hypothyroidism, unspecified: Secondary | ICD-10-CM | POA: Diagnosis not present

## 2017-06-14 DIAGNOSIS — D490 Neoplasm of unspecified behavior of digestive system: Secondary | ICD-10-CM

## 2017-06-14 DIAGNOSIS — Z8542 Personal history of malignant neoplasm of other parts of uterus: Secondary | ICD-10-CM | POA: Diagnosis not present

## 2017-06-14 NOTE — Telephone Encounter (Signed)
Scheduled appt per 4/18 los.

## 2017-06-14 NOTE — Progress Notes (Signed)
  Oncology Nurse Navigator Documentation  Navigator Location: [facility] (06/14/17 1617) Referral date to RadOnc/MedOnc: 06/11/17 (06/14/17 1617) )Navigator Encounter Type: Initial MedOnc (06/14/17 1617) Met patient and family to explain role of navigator and provided my contact information for further questions or concerns.   Abnormal Finding Date: 06/08/17 (06/14/17 1617) Confirmed Diagnosis Date: 06/13/17 (06/14/17 1617)               Patient Visit Type: Initial;MedOnc (06/14/17 1617) Treatment Phase: Pre-Tx/Tx Discussion (06/14/17 1617) Barriers/Navigation Needs: Education (06/14/17 1617) Education: Understanding Cancer/ Treatment Options;Other(Port-A-Cath education) (06/14/17 1617) Provided information from [facility] and [email or web address]. Reviewed Folfirinox regimen and possible side effects.  Interventions: Education (06/14/17 1617)     Education Method: Verbal;Teach-back (06/14/17 1617)      Acuity: Level 3 (06/14/17 1617)     Acuity Level 3: Ongoing guidance and education provided throughout treatment;Emotional needs (06/14/17 1617)   Time Spent with Patient: 45 (06/14/17 1617)

## 2017-06-14 NOTE — Telephone Encounter (Signed)
Scheduled appt per 4/18 los - Gave patient AVS and calender per los. - schedule per patient request everything on one day.

## 2017-06-15 ENCOUNTER — Encounter: Payer: Self-pay | Admitting: Hematology

## 2017-06-15 NOTE — Progress Notes (Signed)
  Oncology Nurse Navigator Documentation  Navigator Location: [facility] (06/15/17 1417)   )Navigator Encounter Type: Telephone (06/15/17 1417) Telephone: [facility] Crocker Call;Appt Confirmation/Clarification (06/15/17 1417) Called and spoke with daughter-in-law to confirm CT C on 06/21/17 @ 11:30 [facility] radiology. Patient to arrive at 11:15.                                          Acuity: Level 3 (06/15/17 1417)         Time Spent with Patient: 15 (06/15/17 1417)

## 2017-06-15 NOTE — Addendum Note (Signed)
Addended by: Truitt Merle on: 06/15/2017 07:44 AM   Modules accepted: Orders

## 2017-06-18 DIAGNOSIS — C250 Malignant neoplasm of head of pancreas: Secondary | ICD-10-CM

## 2017-06-18 NOTE — Progress Notes (Signed)
Spoke with patient at med/onc appointment on 06/15/17 and patient agreeable to physical therapy consult.

## 2017-06-20 NOTE — Progress Notes (Signed)
Called both patient and daughter-in-law, Carly [name] to discuss results from today's GI Tumor Board. 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 Patient would like additional time to discuss chemo option with sons and will call me back with decision.

## 2017-06-21 ENCOUNTER — Ambulatory Visit (HOSPITAL_COMMUNITY)
Admission: RE | Admit: 2017-06-21 | Discharge: 2017-06-21 | Disposition: A | Discharge status: Home / Self Care | Payer: Medicare Other | Source: Ambulatory Visit | Attending: Hematology | Admitting: Hematology

## 2017-06-21 DIAGNOSIS — R911 Solitary pulmonary nodule: Secondary | ICD-10-CM | POA: Diagnosis not present

## 2017-06-21 DIAGNOSIS — I700 Atherosclerosis of aorta: Secondary | ICD-10-CM | POA: Insufficient documentation

## 2017-06-21 DIAGNOSIS — D490 Neoplasm of unspecified behavior of digestive system: Secondary | ICD-10-CM | POA: Diagnosis not present

## 2017-06-22 ENCOUNTER — Telehealth: Payer: Self-pay | Admitting: *Deleted

## 2017-06-22 MED ORDER — FLUCONAZOLE 150 MG PO TABS: ORAL_TABLET | ORAL | 0 refills | Status: DC

## 2017-06-22 NOTE — Telephone Encounter (Signed)
Patient called c/o yeast infection vaginal itching only, states she was in hot tub for about 30 minutes and now has itching. Patient said she has taken in past and done well with Rx Per today protocol  I will send in Rx for diflucan 150 take one table now and repeat in 72 hours.

## 2017-06-26 ENCOUNTER — Ambulatory Visit: Payer: Medicare Other | Attending: Hematology | Admitting: Physical Therapy

## 2017-06-26 ENCOUNTER — Other Ambulatory Visit: Payer: Self-pay

## 2017-06-26 ENCOUNTER — Encounter: Payer: Self-pay | Admitting: Physical Therapy

## 2017-06-26 DIAGNOSIS — R2689 Other abnormalities of gait and mobility: Secondary | ICD-10-CM | POA: Diagnosis not present

## 2017-06-26 DIAGNOSIS — M6281 Muscle weakness (generalized): Secondary | ICD-10-CM

## 2017-06-26 DIAGNOSIS — C250 Malignant neoplasm of head of pancreas: Secondary | ICD-10-CM | POA: Diagnosis not present

## 2017-06-26 NOTE — Patient Instructions (Signed)
About Abdominal Massage  Abdominal massage, also called external colon massage, is a self-treatment circular massage technique that can reduce and eliminate gas and ease constipation. The colon naturally contracts in waves in a clockwise direction starting from inside the right hip, moving up toward the ribs, across the belly, and down inside the left hip.  When you perform circular abdominal massage, you help stimulate your colon's normal wave pattern of movement called peristalsis.  It is most beneficial when done after eating.  Positioning You can practice abdominal massage with oil while lying down, or in the shower with soap.  Some people find that it is just as effective to do the massage through clothing while sitting or standing.  How to Massage Start by placing your finger tips or knuckles on your right side, just inside your hip bone.  . Make small circular movements while you move upward toward your rib cage.   . Once you reach the bottom right side of your rib cage, take your circular movements across to the left side of the bottom of your rib cage.  . Next, move downward until you reach the inside of your left hip bone.  This is the path your feces travel in your colon. . Continue to perform your abdominal massage in this pattern for 10 minutes each day.     You can apply as much pressure as is comfortable in your massage.  Start gently and build pressure as you continue to practice.  Notice any areas of pain as you massage; areas of slight pain may be relieved as you massage, but if you have areas of significant or intense pain, consult with your healthcare provider.  Other Considerations . General physical activity including bending and stretching can have a beneficial massage-like effect on the colon.  Deep breathing can also stimulate the colon because breathing deeply activates the same nervous system that supplies the colon.   . Abdominal massage should always be used in  combination with a bowel-conscious diet that is high in the proper type of fiber for you, fluids (primarily water), and a regular exercise program. [facility] [address], [address] [place], [place] [address] Phone # [phone] Fax [phone]  

## 2017-06-26 NOTE — Therapy (Signed)
[facility] [address] [place], [place], [address] Phone: [phone]   Fax:  [phone]  Physical Therapy Evaluation  Patient Details  Name: Carly [name] MRN: [id number] Date of Birth: 06/18/1940 Referring Provider: Dr. Truitt Merle   Encounter Date: 06/26/2017  Carly [name] End of Session - 06/26/17 2123    Visit Number  1    Date for Carly [name] Re-Evaluation  08/21/17    Authorization Type  medicare    Carly [name] Start Time  1445    Carly [name] Stop Time  1530    Carly [name] Time Calculation (min)  45 min    Activity Tolerance  Patient tolerated treatment well    Behavior During Therapy  [facility] for tasks assessed/performed       Past Medical History:  Diagnosis Date  . Allergic rhinitis    Skin Test 04-14-2009  . Asthma   . Cancer ([place]) 1981   adenocarcinoma of uterus  . Diabetes mellitus without complication ([place])    type II, no meds per Carly [name]  . Hyperlipemia   . Hypertension   . Insomnia     Past Surgical History:  Procedure Laterality Date  . ABDOMINAL HYSTERECTOMY    . CARPAL TUNNEL RELEASE    . EUS N/A 06/13/2017   Procedure: FULL UPPER ENDOSCOPIC ULTRASOUND (EUS) RADIAL;  Surgeon: Arta Silence, MD;  Location: [facility];  Service: Endoscopy;  Laterality: N/A;  . I&D EXTREMITY  09/17/2011   Procedure: IRRIGATION AND DEBRIDEMENT EXTREMITY;  Surgeon: Roseanne Kaufman, MD;  Location: [place];  Service: Orthopedics;  Laterality: Left;  with drain placement  . NASAL SEPTOPLASTY W/ TURBINOPLASTY    . pelvic floor rebuild    . TONSILLECTOMY    . TOTAL ABDOMINAL HYSTERECTOMY W/ BILATERAL SALPINGOOPHORECTOMY      There were no vitals filed for this visit.   Subjective Assessment - 06/26/17 1153    Subjective  I have not decided about the chemotherapy yet.  I do ride my bike 15 min in morning and night. I get 6000 steps per day. I have upper abdominal pain and difficulty with constipation. Patient has difficulty with bilateral knee pain. Abdominal  pain started 02/27/2017.     Patient is accompained by:  Family member daughter-n-law    Patient Stated Goals  keep up endurance while going through treatments while being careful of her knees.     Currently in Pain?  Yes    Pain Score  2     Pain Location  Abdomen    Pain Orientation  Left;Upper    Pain Descriptors / Indicators  Shooting    Pain Type  Acute pain    Pain Onset  More than a month ago    Pain Frequency  Intermittent    Aggravating Factors   starts after dinner    Pain Relieving Factors  heating pad    Multiple Pain Sites  Yes    Pain Score  2    Pain Location  Knee    Pain Orientation  Left;Right    Pain Descriptors / Indicators  Dull    Pain Type  Chronic pain    Pain Onset  More than a month ago    Pain Frequency  Intermittent    Aggravating Factors   sittig    Pain Relieving Factors  ice         [facility] Carly [name] Assessment - 06/26/17 0001      Assessment   Medical Diagnosis  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 Truitt Merle    Onset Date/Surgical Date  06/13/17    Prior Therapy  none      Precautions   Precautions  Other (comment)    Precaution Comments  cancer; has not started chemotherapy ; be careful on knee pain      Restrictions   Weight Bearing Restrictions  No      Balance Screen   Has the patient fallen in the past 6 months  Yes    How many times?  1 got foot caught on the heating pad cord    Has the patient had a decrease in activity level because of a fear of falling?   No unsteady on thick grass    Is the patient reluctant to leave their home because of a fear of falling?   No      Home Environment   Living Environment  Private residence    Type of [place] to enter    [place]  One level      Prior Function   Level of [place]  Retired    Leisure  bike 15 min. x2      Cognition   Overall Cognitive Status  Within Functional Limits for tasks assessed       Sensation   Light Touch  Appears Intact lower leg and feet bilateral      Posture/Postural Control   Posture/Postural Control  No significant limitations      ROM / Strength   AROM / PROM / Strength  AROM;PROM;Strength      AROM   Right/Left Knee  Right;Left      Strength   Right Hip Extension  3/5    Right Hip External Rotation   4/5    Right Hip Internal Rotation  4/5    Right Hip ABduction  4/5    Right Hip ADduction  4/5    Left Hip Extension  4/5    Left Hip External Rotation  4/5    Left Hip Internal Rotation  4/5    Left Hip ABduction  4/5    Left Hip ADduction  3/5    Right Knee Flexion  4/5    Right Knee Extension  4+/5    Left Knee Flexion  4/5    Left Knee Extension  4+/5      Palpation   Palpation comment  no palpable tenderness located on left upper quadrant      Standardized Balance Assessment   Standardized Balance Assessment  Berg Balance Test      Berg Balance Test   Sit to Stand  Able to stand  independently using hands    Standing Unsupported  Able to stand safely 2 minutes    Sitting with Back Unsupported but Feet Supported on Floor or Stool  Able to sit safely and securely 2 minutes    Stand to Sit  Controls descent by using hands    Transfers  Able to transfer safely, minor use of hands    Standing Unsupported with Eyes Closed  Able to stand 10 seconds safely    Standing Ubsupported with Feet Together  Able to place feet together independently and stand for 1 minute with supervision    From Standing, Reach Forward with Outstretched Arm  Can reach confidently >25 cm (10")    From Standing Position,  Pick up Object from [place] to pick up shoe safely and easily    From Standing Position, Turn to Look Behind Over each Shoulder  Looks behind from both sides and weight shifts well    Turn 360 Degrees  Able to turn 360 degrees safely in 4 seconds or less    Standing Unsupported, Alternately Place Feet on Step/Stool  Able to stand independently  and safely and complete 8 steps in 20 seconds    Standing Unsupported, One Foot in [place] to take small step independently and hold 30 seconds    Standing on One Leg  Unable to try or needs assist to prevent fall    Total Score  47    Berg comment:  50% chance of falling                Objective measurements completed on examination: See above findings.              Carly [name] Education - 06/26/17 2123    Education provided  Yes    Education Details  abdominal massage    Person(s) Educated  Patient    Methods  Explanation;Demonstration;Handout;Verbal cues    Comprehension  Verbalized understanding;Returned demonstration       Carly [name] Short Term Goals - 06/26/17 2152      Carly [name] SHORT TERM GOAL #1   Title  independent with initial HEP    Time  4    Period  Weeks    Status  New    Target Date  07/24/17      Carly [name] SHORT TERM GOAL #2   Title  understand bone health and education on osteoporosis    Time  4    Period  Weeks    Status  New    Target Date  07/24/17      Carly [name] SHORT TERM GOAL #3   Title  understand tips for fall prevention due to BERG balance score 47/56    Time  4    Period  Weeks    Status  New    Target Date  07/24/17      Carly [name] SHORT TERM GOAL #4   Title  abilty to exercise 15 minutes in therapy at rate of perceived exertion between 13-16     Time  4    Period  Weeks    Status  New    Target Date  07/24/17      Carly [name] SHORT TERM GOAL #5   Title  take baseline of 6 minute walk test and record    Time  4    Period  Weeks    Status  New    Target Date  07/24/17        Carly [name] Long Term Goals - 06/26/17 2158      Carly [name] LONG TERM GOAL #1   Title  independent with HEP and understand how to progress herself while knowing when it is alright to rest during chemotherapy treatment    Time  8    Period  Weeks    Status  New    Target Date  08/21/17      Carly [name] LONG TERM GOAL #2   Title  Berg balance score >/= 52/56 due to improve endurance and strength of lower  extremities so she is able to live by herself with greater ease    Time  8    Period  Weeks    Status  New  Target Date  08/21/17      Carly [name] LONG TERM GOAL #3   Title  ability to exercise for 30 minutes with rate of perceived exertion between 13-16     Time  8    Period  Weeks    Status  New    Target Date  08/21/17      Carly [name] LONG TERM GOAL #4   Title  understand activity pacing/ conservation of energy due to possibe chemotherapy fatique    Time  8    Period  Weeks    Status  New    Target Date  08/21/17      Carly [name] LONG TERM GOAL #5   Title  6 minute walk test >/= 1413 feet due to being within age- matched means    Time  8    Period  Weeks    Status  New    Target Date  08/21/17             Plan - 06/26/17 2127    Clinical Impression Statement  Patient is a 77 year old female with diagnosis of Pancreatic Cancer stage III on 06/13/2017.  Patient will be receiving chemotherapy in the future due to cancer is no resectable.  Patient reports left upper abdominal pain at level 2/10 that will start after dinner and bilateral knee pain at level 2/10 with sitting.  Patient reports she has had injections in her knee in the past and may need them soon again. No palpable tenderness located in the left upper quadrant.  Patient reports increased constipation since she has been diagnosed.  Bilateral hip and knee strength averages 4/5.  Patient usually rides her stationary bike for 2 15 minute sessions but since she has not been well she has not been riding it consistently. Patient has intact sensation to bilateral lower leg and feet to light touch. Patients last bone density exam revealed T-score at 1.7 in the femural neck indicating osteopenia.  Berg Balance score si 47/56 indicating 505 chance of falling.  Patient lives alone in a 1 story home with good family support.  Patient has be careful of exercise due to her bilateral knee pain.  Patient would benefit from skilled therapy to increase strength  and endurance while she is receiving chemotherapy and monitoring for knee pain and bone pain during her treatments.      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     Carly [name] Frequency  2x / week frequency may change when she starts chemotherapy    Carly [name] Duration  8 weeks    Carly [name] Treatment/Interventions  Therapeutic activities;Therapeutic exercise;Balance training;Neuromuscular re-education;Patient/family education;Manual techniques;Energy conservation    Carly [name] Next Visit Plan  nustep; balance training; hip and knee strength; endergy conservation    Carly [name] Home Exercise Plan  tips to reduce falls; information on osteoporosis; education on diarrhea    Consulted and Agree with Plan of Care  Patient;Family member/caregiver    Family Member Consulted  -- daughter in law       Patient  will benefit from skilled therapeutic intervention in order to improve the following deficits and impairments:  Decreased balance, Pain, Decreased mobility, Decreased endurance, Decreased strength  Visit Diagnosis: Muscle weakness (generalized) - Plan: Carly [name] plan of care cert/re-cert  Other abnormalities of gait and mobility - Plan: Carly [name] plan of care cert/re-cert  Malignant neoplasm of head of pancreas ([place]) - Plan: Carly [name] plan of care cert/re-cert     Problem List Patient Active Problem List   Diagnosis Date Noted  . Pancreatic cancer ([place]) 06/13/2017  . Newly diagnosed diabetes ([place]) 04/17/2017  . Osteopenia 03/27/2014  . History of uterine cancer 03/26/2013  .  Cellulitis and abscess of hand 09/17/2011  . Dog bite(E906.0) 09/17/2011  . Hypothyroidism 09/17/2011  . HYPERLIPIDEMIA 03/16/2007  . ALLERGIC AND NONALLERGIC RHINITIS 03/16/2007  . Allergic-infective asthma 03/13/2007    Carly [name], Carly [name] 06/26/17 10:14 PM   [facility] [address] [place], [place], [address] Phone: [phone]   Fax:  [phone]  Name: Carly [name] MRN: [id number] Date of Birth: [birth year]

## 2017-06-28 ENCOUNTER — Other Ambulatory Visit: Payer: Self-pay | Admitting: Nurse Practitioner

## 2017-06-28 DIAGNOSIS — C250 Malignant neoplasm of head of pancreas: Secondary | ICD-10-CM

## 2017-06-28 MED ORDER — TRAMADOL HCL 50 MG PO TABS: ORAL_TABLET | ORAL | 0 refills | Status: DC

## 2017-06-29 ENCOUNTER — Telehealth: Payer: Self-pay | Admitting: Hematology

## 2017-06-29 ENCOUNTER — Inpatient Hospital Stay: Payer: Medicare Other

## 2017-06-29 ENCOUNTER — Inpatient Hospital Stay (HOSPITAL_BASED_OUTPATIENT_CLINIC_OR_DEPARTMENT_OTHER): Payer: Medicare Other | Admitting: Hematology

## 2017-06-29 ENCOUNTER — Inpatient Hospital Stay: Payer: Medicare Other | Admitting: Nutrition

## 2017-06-29 ENCOUNTER — Inpatient Hospital Stay: Payer: Medicare Other | Attending: Hematology

## 2017-06-29 ENCOUNTER — Encounter: Payer: Self-pay | Admitting: Hematology

## 2017-06-29 ENCOUNTER — Encounter: Payer: Self-pay | Admitting: General Practice

## 2017-06-29 VITALS — BP 138/71 | HR 85 | Temp 98.0°F | Resp 16 | Ht 62.5 in | Wt 145.0 lb

## 2017-06-29 DIAGNOSIS — Z8542 Personal history of malignant neoplasm of other parts of uterus: Secondary | ICD-10-CM | POA: Diagnosis not present

## 2017-06-29 DIAGNOSIS — C250 Malignant neoplasm of head of pancreas: Secondary | ICD-10-CM | POA: Diagnosis not present

## 2017-06-29 DIAGNOSIS — E039 Hypothyroidism, unspecified: Secondary | ICD-10-CM | POA: Insufficient documentation

## 2017-06-29 DIAGNOSIS — I10 Essential (primary) hypertension: Secondary | ICD-10-CM | POA: Insufficient documentation

## 2017-06-29 DIAGNOSIS — E7800 Pure hypercholesterolemia, unspecified: Secondary | ICD-10-CM | POA: Insufficient documentation

## 2017-06-29 DIAGNOSIS — M8580 Other specified disorders of bone density and structure, unspecified site: Secondary | ICD-10-CM | POA: Insufficient documentation

## 2017-06-29 DIAGNOSIS — D61810 Antineoplastic chemotherapy induced pancytopenia: Secondary | ICD-10-CM | POA: Insufficient documentation

## 2017-06-29 DIAGNOSIS — H409 Unspecified glaucoma: Secondary | ICD-10-CM

## 2017-06-29 DIAGNOSIS — Z452 Encounter for adjustment and management of vascular access device: Secondary | ICD-10-CM | POA: Insufficient documentation

## 2017-06-29 DIAGNOSIS — R197 Diarrhea, unspecified: Secondary | ICD-10-CM | POA: Diagnosis not present

## 2017-06-29 DIAGNOSIS — Z7189 Other specified counseling: Secondary | ICD-10-CM | POA: Insufficient documentation

## 2017-06-29 DIAGNOSIS — Z5189 Encounter for other specified aftercare: Secondary | ICD-10-CM | POA: Diagnosis not present

## 2017-06-29 DIAGNOSIS — Z5111 Encounter for antineoplastic chemotherapy: Secondary | ICD-10-CM | POA: Diagnosis not present

## 2017-06-29 LAB — CMP (CANCER CENTER ONLY)
ALBUMIN: 3.7 g/dL (ref 3.5–5.0)
ALK PHOS: 87 U/L (ref 40–150)
ALT: 11 U/L (ref 0–55)
ANION GAP: 6 (ref 3–11)
AST: 15 U/L (ref 5–34)
BILIRUBIN TOTAL: 0.4 mg/dL (ref 0.2–1.2)
BUN: 15 mg/dL (ref 7–26)
CALCIUM: 9.9 mg/dL (ref 8.4–10.4)
CO2: 28 mmol/L (ref 22–29)
CREATININE: 0.87 mg/dL (ref 0.60–1.10)
Chloride: 102 mmol/L (ref 98–109)
GFR, Estimated: >60 mL/min (ref >60)
GLUCOSE: 225 mg/dL — AB (ref 70–140)
Potassium: 4.6 mmol/L (ref 3.5–5.1)
Sodium: 136 mmol/L (ref 136–145)
TOTAL PROTEIN: 7.1 g/dL (ref 6.4–8.3)

## 2017-06-29 LAB — CBC WITH DIFFERENTIAL (CANCER CENTER ONLY)
BASOS PCT: 0 %
Basophils Absolute: 0 10*3/uL (ref 0.0–0.1)
EOS ABS: 0.1 10*3/uL (ref 0.0–0.5)
Eosinophils Relative: 1 %
HCT: 40.5 % (ref 34.8–46.6)
HEMOGLOBIN: 13.9 g/dL (ref 11.6–15.9)
LYMPHS ABS: 1.2 10*3/uL (ref 0.9–3.3)
Lymphocytes Relative: 18 %
MCH: 31.7 pg (ref 25.1–34.0)
MCHC: 34.3 g/dL (ref 31.5–36.0)
MCV: 92.3 fL (ref 79.5–101.0)
Monocytes Absolute: 0.6 10*3/uL (ref 0.1–0.9)
Monocytes Relative: 9 %
NEUTROS ABS: 4.6 10*3/uL (ref 1.5–6.5)
Neutrophils Relative %: 72 %
Platelet Count: 173 10*3/uL (ref 145–400)
RBC: 4.39 MIL/uL (ref 3.70–5.45)
RDW: 12.7 % (ref 11.2–14.5)
WBC Count: 6.4 10*3/uL (ref 3.9–10.3)

## 2017-06-29 LAB — CEA (IN HOUSE-CHCC): CEA (CHCC-In House): 2.6 ng/mL (ref 0.00–5.00)

## 2017-06-29 MED ORDER — LIDOCAINE-PRILOCAINE 2.5-2.5 % EX CREA: TOPICAL_CREAM | CUTANEOUS | 3 refills | Status: DC

## 2017-06-29 MED ORDER — PROCHLORPERAZINE MALEATE 10 MG PO TABS: 10.0000 mg | ORAL_TABLET | Freq: Four times a day (QID) | ORAL | 1 refills | Status: DC | PRN

## 2017-06-29 MED ORDER — ONDANSETRON HCL 8 MG PO TABS: 8.0000 mg | ORAL_TABLET | Freq: Two times a day (BID) | ORAL | 1 refills | Status: DC | PRN

## 2017-06-29 MED ORDER — HYDROCODONE-ACETAMINOPHEN 5-325 MG PO TABS: 1.0000 | ORAL_TABLET | Freq: Four times a day (QID) | ORAL | 0 refills | Status: DC | PRN

## 2017-06-29 NOTE — Telephone Encounter (Signed)
Scheduled appt per 5/3 los - Gave patient aVS and calender per los. Treatment for 5/23 not scheduled yet - on hold - patient has another conflicting appt that needs to be cancelled. Will be added when apt is cancelled on monday

## 2017-06-29 NOTE — Progress Notes (Signed)
START ON PATHWAY REGIMEN - Pancreatic     A cycle is every 14 days:     Oxaliplatin      Leucovorin      Irinotecan      5-Fluorouracil      5-Fluorouracil   **Always confirm dose/schedule in your pharmacy ordering system**    Patient Characteristics: Adenocarcinoma, Locally Advanced, Anatomically Unresectable, First Line, PS = 0, 1 Histology: Adenocarcinoma Current evidence of distant metastases<= No AJCC T Category: T4 AJCC N Category: N0 AJCC M Category: M0 AJCC 8 Stage Grouping: III Line of Therapy: First Line  Intent of Therapy: Non-Curative / Palliative Intent, Discussed with Patient

## 2017-06-29 NOTE — Progress Notes (Signed)
77 year old female diagnosed with pancreas cancer.  Past medical history includes uterine cancer in 1981, diabetes type 2, hyperlipidemia, hypertension, and hypothyroidism.  Medications include Xanax, vitamin C, vitamin D, Lipitor, vitamin B12, Synthroid, omega-3 fatty acids, and Prilosec.  Labs were reviewed.  Height: 62.5 inches. Weight: 145 pounds May 3. Usual body weight: 154 pounds February 2019. BMI: 26.1.  Patient denies chewing and swallowing problems. She denies nausea and vomiting. She reports some constipation which is generally relieved with milk of magnesia. She is willing to consider oral nutrition supplements.  Nutrition diagnosis: Unintended weight loss related to pancreas cancer as evidenced by 9 pound weight loss over 3 months.  Intervention: Patient was educated to consume higher calorie, higher protein foods in  6 small meals and snacks. I educated patient on the importance of increasing protein. Educated patient on strategies for constipation. Recommended patient try no added sugar Carnation breakfast essentials and provided samples. Fact sheets were provided.  Questions were answered.  Teach back method used.  Contact information was given.  Monitoring, evaluation, goals: Patient will tolerate adequate calories and protein to minimize further weight loss.  Next visit: To be scheduled as needed.  **Disclaimer: This note was dictated with voice recognition software. Similar sounding words can inadvertently be transcribed and this note may contain transcription errors which may not have been corrected upon publication of note.**

## 2017-06-29 NOTE — Progress Notes (Signed)
[place] CSW Progress Notes  Patient, accompanied by son and daughter in law, requested to speak w CSW.  Reports significant anxiety, more prevalent when alone.  Husband died 5.5 months ago and patient is also processing his death.  Reviewed opportunities for increase support through [place], workshops, programs and short term counseling.  Provided information packet.  Also discussed various ways to manage anxiety and the need to tailor interventions chosen to patient's specific needs.  Patient's daughter in law wondered whether patient could work on increased skills for coping w anxiety while in chemotherapy.  CSW also offered option of referral to community provider to work on anxiety management as well as grief/loss issues related to husbands death and her own serious illness.  Encouraged patient and family to contact CSW as needed for support.  Edwyna Shell, LCSW Clinical Social Worker Phone:  [phone]

## 2017-06-29 NOTE — Progress Notes (Addendum)
[facility]  [phone] [phone]  Clinic Follow Up Note   Patient Care Team: Darcus Austin, MD as PCP - General (Family Medicine) Arta Silence, MD as Consulting Physician (Gastroenterology) Truitt Merle, MD as Consulting Physician (Hematology).  Date of Service:  06/29/2017  CHIEF COMPLAINTS:  Pancreatic Cancer, stage III   Oncology History   Cancer Staging Pancreatic cancer [facility]) Staging form: Exocrine Pancreas, AJCC 8th Edition - Clinical stage from 06/13/2017: Stage III (cT4, cN0, cM0) - Signed by Truitt Merle, MD on 06/13/2017       Pancreatic cancer ([place])   06/13/2017 Initial Diagnosis    Pancreatic cancer ([place])      06/13/2017 Cancer Staging    Staging form: Exocrine Pancreas, AJCC 8th Edition - Clinical stage from 06/13/2017: Stage III (cT4, cN0, cM0) - Signed by Truitt Merle, MD on 06/13/2017      06/13/2017 Procedure    EUS by Dr. Paulita Fujita 06/13/17  IMPRESSION:  -There was no sign of significant pathology in the ampulla. - There was no evidence of significant pathology in the left lobe of the liver. - A mass was identified in the pancreatic head. Abutment SMV; invasion portal confluence; no adenopathy. Fine needle aspiration performed. If this is a pancreatic adenocarcinoma, it would be staged T4/N0/Mx by EUS.      06/13/2017 Initial Biopsy    Diagnosis 06/13/17 FINE NEEDLE ASPIRATION, ENDOSCOPIC, PANCREAS HEAD (SPECIMEN 1 OF 1 COLLECTED 06/13/17): ADENOCARCINOMA. Preliminary Diagnosis Intraoperative Diagnosis: 1-3) ATYPICAL CELLS, ADDITIONAL MATERIAL REQUESTED. (NDK) 4-6 ADENOCARCINOMA. (NDK) Reported to Dr. Paulita Fujita on 06/13/17 @ 11:07am.       06/21/2017 Imaging    CT Chest without contrast IMPRESSION: 1. No findings suspicious for metastatic disease within the chest.  2. Tiny subpleural right lower lobe pulmonary nodule is nonspecific, but likely benign. This can be addressed on follow-up imaging.  3. Aortic Atherosclerosis  (ICD10-I70.0).        HISTORY OF PRESENTING ILLNESS: 06/14/17 Carly [name] 77 y.o. female is a here because of newly diagnosed Pancreatic Cancer. The patient was referred by Dr. Paulita Fujita. The patient presents to the clinic today accompanied by her 2 sons and her daughter-in-law.   She was having aching of her left abdominal starting 3 months ago after UTI. This pain comes and goes, which worsened at night. She denies issues with her appetite overall but she does not eat as much as she did due to the recent death of her husband. She denies any abdominal bloating. The color of urine has not become dark.   Today the patient notes she still has left abdominal pain. She notes she plans to see her grandson in [place] this weekend. She plans to go to the beach for 1-2 weeks in May, 2018. She prefers to be reached by her home phone and then her daughter-in-law.  Socially she lives by herself since her husband's passing in 12/2016. Her house is 1 level. She has support from her son and his wife. She has good family support. She has a stationary bike he rides 30 minutes a day and keeps track of her steps per day. She is very active and goes out often. 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   In the past the patient had a complete hysterectomy in early 1981 for bleeding. During her surgery she had cancer cells found and had a BSO. She had no following treatments. She had a pelvic rebuilt in 1990s due  to prolapse. She had a yearly pap smear. She is diagnosed os type II DM but is not on any medication at this time.  She takes tramadol for the pain as needed, trazodone to help her sleep and takes XANAX for her anxiety. She has been on this for years. She has been on Trazodone for 40 years to also help her anxiety. She takes Lipitor for her high cholesterol, not regularly. She takes HCTZ for her fluid retention which she takes as needed. She takes Lisinopril for her  HTN. She takes Synthroid for her hypothyroidism.  Her maternal grandmother had breast cancer. Her maternal cousin had breast cancer and her paternal cousin had a blood disorder. She smoked for 10-15 years of 1/2-1 pack a day before stopping in 1980s.   On review of symptoms, pt notes left abdominal ache which she tried tylenol and she was recently given Tramadol. This helped her some. She denies abdominal bloating, dark urine or jaundice and denies dysuria.  CURRENT THERAPY: pending chemo FOLFIRINOX every 2 weeks   INTERVAL HISTORY:  Carly [name] 77 y.o. presents to the office today accompanied by her son and daughter-in-law. She lives by herself.    Of note since the patient last visit, she has had a CT Chest without contrast, on 06/21/2017 that showed: No findings suspicious for metastatic disease within the chest. Tiny subpleural right lower lobe pulmonary nodule is nonspecific, but likely benign. This can be addressed on follow-up imaging. Aortic Atherosclerosis (ICD10-I70.0).  She is concerned about the mass and it has caused her some anxiety. Lahna took half a pill of Xanax prior to her visit today. She endorses pain under her left breast that is related to her disease. She has completed her chemotherapy class. Her daughter-in-law is planning to stay with her after her first treatment. She is going to the beach in a week on 07/05/2017 until 07/17/2017. She would like to start treatment when she comes back.  On review of systems, pt reports pain to the LUQ of her abdomen. She denies fever, chills and any other symptoms.    MEDICAL HISTORY:  Past Medical History:  Diagnosis Date  . Allergic rhinitis    Skin Test 04-14-2009  . Asthma   . Cancer ([place]) 1981   adenocarcinoma of uterus  . Diabetes mellitus without complication ([place])    type II, no meds per pt  . Hyperlipemia   . Hypertension   . Insomnia     SURGICAL HISTORY: Past Surgical History:  Procedure Laterality Date  .  ABDOMINAL HYSTERECTOMY    . CARPAL TUNNEL RELEASE    . EUS N/A 06/13/2017   Procedure: FULL UPPER ENDOSCOPIC ULTRASOUND (EUS) RADIAL;  Surgeon: Arta Silence, MD;  Location: [facility];  Service: Endoscopy;  Laterality: N/A;  . I&D EXTREMITY  09/17/2011   Procedure: IRRIGATION AND DEBRIDEMENT EXTREMITY;  Surgeon: Roseanne Kaufman, MD;  Location: [facility];  Service: Orthopedics;  Laterality: Left;  with drain placement  . NASAL SEPTOPLASTY W/ TURBINOPLASTY    . pelvic floor rebuild    . TONSILLECTOMY    . TOTAL ABDOMINAL HYSTERECTOMY W/ BILATERAL SALPINGOOPHORECTOMY      SOCIAL HISTORY: Social History   Socioeconomic History  . Marital status: Widowed    Spouse name: Not on file  . Number of children: Not on file  . Years of education: Not on file  . Highest education level: Not on file  Occupational History  . Not on file  Social Needs  .  Financial resource strain: Not on file  . Food insecurity:    Worry: Not on file    Inability: Not on file  . Transportation needs:    Medical: Not on file    Non-medical: Not on file  Tobacco Use  . Smoking status: Former Smoker    Packs/day: 0.50    Years: 15.00    Pack years: 7.50    Types: Cigarettes    Last attempt to quit: 02/27/1978    Years since quitting: 39.3  . Smokeless tobacco: Never Used  Substance and Sexual Activity  . Alcohol use: Yes    Alcohol/week: 0.0 oz    Comment: occ- glass of wine  . Drug use: No  . Sexual activity: Yes    Comment: 1st intercouse- 15, partners- 1  Lifestyle  . Physical activity:    Days per week: Not on file    Minutes per session: Not on file  . Stress: Not on file  Relationships  . Social connections:    Talks on phone: Not on file    Gets together: Not on file    Attends religious service: Not on file    Active member of club or organization: Not on file    Attends meetings of clubs or organizations: Not on file    Relationship status: Not on file  . Intimate partner violence:     Fear of current or ex partner: Not on file    Emotionally abused: Not on file    Physically abused: Not on file    Forced sexual activity: Not on file  Other Topics Concern  . Not on file  Social History Narrative  . Not on file    FAMILY HISTORY: Family History  Problem Relation Age of Onset  . Stroke Mother   . Heart attack Father   . Breast cancer Maternal Grandmother   . Breast cancer Cousin   . Cancer Cousin        oral cancer     ALLERGIES:  is allergic to cephalexin and nabumetone.  MEDICATIONS:  Current Outpatient Medications  Medication Sig Dispense Refill  . ALPRAZolam (XANAX) 0.5 MG tablet Take 1 tablet by mouth Once daily as needed for anxiety.     . Ascorbic Acid (VITAMIN C) 500 MG PACK Take 100 mg by mouth daily.    Marland Kitchen atorvastatin (LIPITOR) 10 MG tablet Take 10 mg by mouth at bedtime.     Marland Kitchen CALCIUM PO Take 1 tablet by mouth daily.     . Cholecalciferol (VITAMIN D) 2000 units CAPS Take by mouth.    . Cyanocobalamin (VITAMIN B-12 PO) Take 1 tablet by mouth daily.     . fluconazole (DIFLUCAN) 150 MG tablet Take one tablet now and repeat in 72 hours 2 tablet 0  . Glucosamine-Chondroit-Vit C-Mn (GLUCOSAMINE 1500 COMPLEX PO) Take 1 capsule by mouth daily.      . hydrochlorothiazide (HYDRODIURIL) 25 MG tablet Take 1 tablet by mouth daily as needed.     Marland Kitchen HYDROcodone-acetaminophen (NORCO) 5-325 MG tablet Take 1 tablet by mouth every 6 (six) hours as needed for moderate pain. 30 tablet 0  . hyoscyamine (LEVBID) 0.375 MG 12 hr tablet Take 0.375 mg by mouth every 12 (twelve) hours as needed for cramping.     . latanoprost (XALATAN) 0.005 % ophthalmic solution 1 drop at bedtime.    Marland Kitchen levothyroxine (SYNTHROID, LEVOTHROID) 25 MCG tablet Take 25 mcg by mouth daily before breakfast.     . lisinopril (  PRINIVIL,ZESTRIL) 10 MG tablet Take 10 mg by mouth at bedtime.    . Omega-3 Fatty Acids (FISH OIL PO) Take 1 capsule by mouth daily.     Marland Kitchen omeprazole (PRILOSEC) 20 MG capsule Take  1 tablet by mouth daily.    Glory Rosebush DELICA LANCETS 51O MISC USE TO TEST YOUR BLOOD SUGAR ONCE A DAY FASTING AND 2 HRS AFTER A MEAL AS NEEDED  3  . ONETOUCH VERIO test strip USE TO TEST YOUR BLOOD SUGAR ONCE A DAY FASTING & 2 HRS AFTER A MEAL AS NEEDED  3  . traMADol (ULTRAM) 50 MG tablet Take 1-2 tablets every 6 hours as needed for pain 60 tablet 0  . traZODone (DESYREL) 100 MG tablet Take 1 tablet (100 mg total) by mouth at bedtime. (Patient taking differently: Take 50 mg by mouth at bedtime. ) 90 tablet 3   No current facility-administered medications for this visit.     REVIEW OF SYSTEMS:   Constitutional: Denies fevers, chills or abnormal night sweats Eyes: Denies blurriness of vision, double vision or watery eyes Ears, nose, mouth, throat, and face: Denies mucositis or sore throat Respiratory: Denies cough, dyspnea or wheezes Cardiovascular: Denies palpitation, chest discomfort or lower extremity swelling Gastrointestinal:  Denies nausea, heartburn or change in bowel habits (+) left abdominal pain  Skin: Denies abnormal skin rashes Lymphatics: Denies new lymphadenopathy or easy bruising Neurological:Denies numbness, tingling or new weaknesses Behavioral/Psych: Mood is stable, no new changes, (+) anxiety  All other systems were reviewed with the patient and are negative.  PHYSICAL EXAMINATION: ECOG PERFORMANCE STATUS: 1 - Symptomatic but completely ambulatory  Vitals:   06/29/17 0954  BP: 138/71  Pulse: 85  Resp: 16  Temp: 98 F (36.7 C)  SpO2: 98%   Filed Weights   06/29/17 0954  Weight: 145 lb (65.8 kg)    GENERAL:alert, no distress and comfortable SKIN: skin color, texture, turgor are normal, no rashes or significant lesions EYES: normal, conjunctiva are pink and non-injected, sclera clear OROPHARYNX:no exudate, no erythema and lips, buccal mucosa, and tongue normal  NECK: supple, thyroid normal size, non-tender, without nodularity LYMPH:  no palpable  lymphadenopathy in the cervical, axillary or inguinal LUNGS: clear to auscultation and percussion with normal breathing effort HEART: regular rhythm and no murmurs and no lower extremity edema (+) increased heart rate ABDOMEN:abdomen soft, non-tender and normal bowel sounds Musculoskeletal:no cyanosis of digits and no clubbing  PSYCH: alert & oriented x 3 with fluent speech NEURO: no focal motor/sensory deficits  LABORATORY DATA:  I have reviewed the data as listed CBC Latest Ref Rng & Units 06/29/2017 09/19/2011 09/17/2011  WBC 3.9 - 10.3 K/uL 6.4 5.9 -  Hemoglobin 11.6 - 15.9 g/dL 13.9 13.9 13.9  Hematocrit 34.8 - 46.6 % 40.5 40.0 41.0  Platelets 145 - 400 K/uL 173 184 -    CMP Latest Ref Rng & Units 06/29/2017 09/19/2011 09/17/2011  Glucose 70 - 140 mg/dL 225(H) 104(H) 111(H)  BUN 7 - 26 mg/dL 15 11 11   Creatinine 0.60 - 1.10 mg/dL 0.87 0.80 1.00  Sodium 136 - 145 mmol/L 136 139 139  Potassium 3.5 - 5.1 mmol/L 4.6 4.0 4.3  Chloride 98 - 109 mmol/L 102 103 103  CO2 22 - 29 mmol/L 28 28 -  Calcium 8.4 - 10.4 mg/dL 9.9 9.4 -  Total Protein 6.4 - 8.3 g/dL 7.1 - -  Total Bilirubin 0.2 - 1.2 mg/dL 0.4 - -  Alkaline Phos 40 - 150 U/L 87 - -  AST 5 - 34 U/L 15 - -  ALT 0 - 55 U/L 11 - -    PATHOLOGY   Diagnosis 06/13/17 FINE NEEDLE ASPIRATION, ENDOSCOPIC, PANCREAS HEAD (SPECIMEN 1 OF 1 COLLECTED 06/13/17): ADENOCARCINOMA. Preliminary Diagnosis Intraoperative Diagnosis: 1-3) ATYPICAL CELLS, ADDITIONAL MATERIAL REQUESTED. (NDK) 4-6 ADENOCARCINOMA. (NDK) Reported to Dr. Paulita Fujita on 06/13/17 @ 11:07am.    PROCEDURES  EUS by Dr. Paulita Fujita 06/13/17  IMPRESSION:  -There was no sign of significant pathology in the ampulla. - There was no evidence of significant pathology in the left lobe of the liver. - A mass was identified in the pancreatic head. Abutment SMV; invasion portal confluence; no adenopathy. Fine needle aspiration performed. If this is a pancreatic adenocarcinoma, it would be  staged T4/N0/Mx by EUS.   RADIOGRAPHIC STUDIES: I have personally reviewed the radiological images as listed and agreed with the findings in the report. Ct Chest Wo Contrast  Result Date: 06/22/2017 CLINICAL DATA:  Newly diagnosed pancreatic cancer. Some left-sided chest pain. EXAM: CT CHEST WITHOUT CONTRAST TECHNIQUE: Multidetector CT imaging of the chest was performed following the standard protocol without IV contrast. COMPARISON:  Abdominopelvic CT 06/08/2017. FINDINGS: Cardiovascular: Atherosclerosis of the aorta, great vessels and coronary arteries. No acute vascular findings on noncontrast imaging. Possible calcifications of the aortic valve. There is a small amount of fluid within the superior pericardial recess. The heart size is normal. Mediastinum/Nodes: There are no enlarged mediastinal, hilar or axillary lymph nodes. The thyroid gland, trachea and esophagus demonstrate no significant findings. Lungs/Pleura: There is no pleural effusion. 4 mm subpleural right lower lobe nodule on image 88/5. No suspicious pulmonary nodules. There is mild linear atelectasis or scarring at the left lung base. Upper abdomen: Known pancreatic head mass is not well seen on this noncontrast study, although is grossly unchanged. Musculoskeletal/Chest wall: There is no chest wall mass or suspicious osseous finding. IMPRESSION: 1. No findings suspicious for metastatic disease within the chest. 2. Tiny subpleural right lower lobe pulmonary nodule is nonspecific, but likely benign. This can be addressed on follow-up imaging. 3.  Aortic Atherosclerosis (ICD10-I70.0). Electronically Signed   By: Richardean Sale M.D.   On: 06/22/2017 09:32   Ct Abdomen Pelvis W Contrast  Result Date: 06/08/2017 CLINICAL DATA:  Left flank pain, left lower quadrant pain. EXAM: CT ABDOMEN AND PELVIS WITH CONTRAST TECHNIQUE: Multidetector CT imaging of the abdomen and pelvis was performed using the standard protocol following bolus  administration of intravenous contrast. CONTRAST:  177mL ISOVUE-300 IOPAMIDOL (ISOVUE-300) INJECTION 61% Creatinine was obtained on site at [place] at [address] Results: Creatinine 0.7 mg/dL. COMPARISON:  CT 01/03/2013 FINDINGS: Lower chest: Heart is mildly enlarged. Linear atelectasis or scarring in the lingula. No effusions. Hepatobiliary: No focal hepatic abnormality. Gallbladder unremarkable. Pancreas: Low-density mass noted within the pancreatic head is measures 2.6 x 2.2 cm concerning for pancreatic cancer. Associated pancreatic ductal dilatation, measuring up to 8 mm in the pancreatic body. Pancreatic atrophy in the body and tail. No biliary ductal dilatation. Spleen: No focal abnormality.  Normal size. Adrenals/Urinary Tract: Bilateral renal parapelvic cysts. No hydronephrosis or focal renal abnormality. No adrenal mass. Urinary bladder unremarkable. Stomach/Bowel: Descending colonic and sigmoid diverticulosis. Scattered right colonic diverticula as well. Slight stranding noted around the distal descending colon may reflect early active diverticulitis. Vascular/Lymphatic: Aortic atherosclerosis. No enlarged abdominal or pelvic lymph nodes. 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 Remainder the portal vein is patent. Reproductive: Is  Prior hysterectomy.  No adnexal masses. Other: No free fluid or free air. Musculoskeletal: No acute bony abnormality. IMPRESSION: Irregular solid hypoechoic mass within the pancreatic head, 2.6 x 2.2 cm with associated pancreatic ductal dilatation and pancreatic atrophy, most compatible with pancreatic cancer. 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 Diffuse colonic diverticulosis. Slight stranding around the distal descending colon may reflect early active diverticulitis. Bilateral renal parapelvic cysts. Aortic  atherosclerosis. These results were called by telephone at the time of interpretation on 06/08/2017 at 10:41 am to Dr. London Pepper as Dr. Inda Merlin is not available, who verbally acknowledged these results. Electronically Signed   By: Rolm Baptise M.D.   On: 06/08/2017 10:50    ASSESSMENT & PLAN:  KATHERYN CULLITON is a 77 y.o. Caucasian female with a history of hypothyroidism, GERD, Anxiety, osteopenia, Glaucoma, HTN, hypercholesterolemia, and H/o Uterine Cancer, presented with intermittent upper abdominal pain    1. 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  -She has locally advanced Adenocarcinoma of Pancreatic Head.  The EUS showed a 3.0 x 3.0 cm mass in the pancreatic head, with sonographic evidence suggesting invasion into the superior mesenteric artery, the portal vein,, the SMV, and splenic vein.  Unfortunately this is likely unresectable tumor, or at least a borderline resectable disease due to the invasion of SMA. -Staging scan was negative for metastasis. -Her case was discussed in our GI tumor board.  Images were reviewed.  Our surgeon Dr. Barry Dienes concurs that her pancreatic cancer is not resectable. -We reviewed the natural history of pancreatic cancer, and discussed treatment options including chemotherapy and radiation, and the goal of therapy is palliative to prolong his life and improve his quality of life. -Given her overall good baseline health and PS, I recommend her to consider chemo. I think she is a candidate for intensive chemotherapy such as FOLFIRINOX every 2 weeks, or gemcitabine and Abraxane weekly for 2 weeks on and 1 week off. If she is not willing to go through intensive chemo, the single agent gemcitabine or palliative care alone would be options.   -After lengthy discussion with patient and her family members, she agrees to try chemotherapy FOLFIRINOX    --Chemotherapy consent: Side effects including but does not not limited to, fatigue, nausea, vomiting, diarrhea, hair loss, neuropathy, cold sensitivity, fluid retention, renal and kidney dysfunction, neutropenic fever, needed for blood transfusion, bleeding, coronary artery spasm and heart attack, heart failure, were discussed with patient in great detail. She agrees to proceed. -The goal of therapy is palliative -plan to monitor her response by restaging CT every 2-3 months, plan to do chem 4-6 months if she tolerates, f/u consolidation RT -We also discussed if she has great response to treatment, she can consider seeing a pancreatic surgeon in other big cancer center for a second opinion  -She has participated a chemo class. -She has scheduled a trip to beach next week, would like to start chemotherapy when she returns in 2 weeks  -Arrange port placement before her first chemo  2. Abdominal pain and weight loss  -She is scheduled to see our dietitian today.  I encouraged her to take a nutritional supplement -She has been using tramadol as needed for pain.  Given the upcoming trip, I prescribed a prescription of hydrocodone for her, in case her  pain gets worse during the trip.  3. H/o Uterine Cancer, Stage I, diagnosed in 1981 -She had a total hysterectomy and BSO in 1981. She had no following treatments -Followed by her Gynecologist with yearly pap smears.   4. Genetic Testing  -Given her history of uterine cancer and family history of breast cancer she is eligible for genetic testing.  -She is interested. I will send genetic counseling referral.   5. Social Support  -She lives alone but has good familial support.  -I previously recommended that she has someone check on her weekly especially when on chemo treatment.   6. Goal of care discussion  -We again discussed the incurable nature of her cancer, and the overall poor prognosis, especially if she does not have good response to  chemotherapy or progress on chemo -The patient understands the goal of care is palliative. -she is full code now   PLAN:  Genetic referral made today  IR port placement on 5/22 Lab, flush, f/u with me or lacie and chemo FOLFIRINOXO in 5/23, 6/6 and 6/20, dose modified, no 5-fu bolus, with irinotecan 150mg /m2. I will also decrease oxaliplatin to 75mg /m2 for cycle 1 to see if she tolerates well, due to her age. Neulasta on day 3 SW referral     Orders Placed This Encounter  Procedures  . IR FLUORO GUIDE PORT INSERTION LEFT    Standing Status:   Future    Standing Expiration Date:   08/30/2018    Order Specific Question:   Reason for Exam (SYMPTOM  OR DIAGNOSIS REQUIRED)    Answer:   CHEMO    Order Specific Question:   Preferred Imaging Location?    Answer:   [facility]    All questions were answered. The patient knows to call the clinic with any problems, questions or concerns. I spent 35 minutes counseling the patient face to face. The total time spent in the appointment was 45 minutes and more than 50% was on counseling.     Truitt Merle, MD 06/29/2017 4:38 PM   This document serves as a record of services personally performed by Truitt Merle, MD. It was created on her behalf by Margit Banda, a trained medical scribe. The creation of this record is based on the scribe's personal observations and the provider's statements to them.   I have reviewed the above documentation for accuracy and completeness, and I agree with the above.

## 2017-07-02 ENCOUNTER — Ambulatory Visit: Payer: Medicare Other | Attending: Hematology | Admitting: Physical Therapy

## 2017-07-02 DIAGNOSIS — R2689 Other abnormalities of gait and mobility: Secondary | ICD-10-CM | POA: Diagnosis not present

## 2017-07-02 DIAGNOSIS — C250 Malignant neoplasm of head of pancreas: Secondary | ICD-10-CM

## 2017-07-02 DIAGNOSIS — M6281 Muscle weakness (generalized): Secondary | ICD-10-CM | POA: Diagnosis not present

## 2017-07-02 NOTE — Therapy (Signed)
[facility] [address] [place], [place], [address] Phone: [phone]   Fax:  [phone]  Physical Therapy Treatment  Patient Details  Name: Carly [name] MRN: [id number] Date of Birth: [birth year] Referring Provider: Dr. Truitt Merle   Encounter Date: 07/02/2017  PT End of Session - 07/02/17 1324    Visit Number  2    Date for PT Re-Evaluation  08/21/17    Authorization Type  medicare    PT Start Time  1145    PT Stop Time  1230    PT Time Calculation (min)  45 min    Activity Tolerance  Patient tolerated treatment well    Behavior During Therapy  [facility] for tasks assessed/performed       Past Medical History:  Diagnosis Date  . Allergic rhinitis    Skin Test 04-14-2009  . Asthma   . Cancer ([place]) 1981   adenocarcinoma of uterus  . Diabetes mellitus without complication ([place])    type II, no meds per pt  . Hyperlipemia   . Hypertension   . Insomnia     Past Surgical History:  Procedure Laterality Date  . ABDOMINAL HYSTERECTOMY    . CARPAL TUNNEL RELEASE    . EUS N/A 06/13/2017   Procedure: FULL UPPER ENDOSCOPIC ULTRASOUND (EUS) RADIAL;  Surgeon: Arta Silence, MD;  [facility];  Service: Endoscopy;  Laterality: N/A;  . I&D EXTREMITY  09/17/2011   Procedure: IRRIGATION AND DEBRIDEMENT EXTREMITY;  Surgeon: Roseanne Kaufman, MD;  Location: [place];  Service: Orthopedics;  Laterality: Left;  with drain placement  . NASAL SEPTOPLASTY W/ TURBINOPLASTY    . pelvic floor rebuild    . TONSILLECTOMY    . TOTAL ABDOMINAL HYSTERECTOMY W/ BILATERAL SALPINGOOPHORECTOMY      There were no vitals filed for this visit.                    [facility] Adult PT Treatment/Exercise - 07/02/17 0001      Ambulation/Gait   Gait Pattern  -- left foot shorter step length, decreased left heel strike    Gait Comments  abulate outside up and down an inclne, step up on curb and down, walk on grass and turn to  check balance and gait for 15 minutes gait belt placed on patient      Exercises   Exercises  Knee/Hip      Knee/Hip Exercises: Aerobic   Nustep  5 min level 3 arms and legs while assessing the patient             PT Education - 07/02/17 1401    Education Details  Access Code: [email or web address]     Person(s) Educated  Patient    Methods  Explanation;Demonstration;Verbal cues;Handout    Comprehension  Returned demonstration;Verbalized understanding       PT Short Term Goals - 06/26/17 2152      PT SHORT TERM GOAL #1   Title  independent with initial HEP    Time  4    Period  Weeks    Status  New    Target Date  07/24/17      PT SHORT TERM GOAL #2   Title  understand bone health and education on osteoporosis    Time  4    Period  Weeks    Status  New    Target Date  07/24/17      PT SHORT TERM GOAL #3   Title  understand tips for fall prevention due to BERG balance score 47/56    Time  4    Period  Weeks    Status  New    Target Date  07/24/17      PT SHORT TERM GOAL #4   Title  abilty to exercise 15 minutes in therapy at rate of perceived exertion between 13-16     Time  4    Period  Weeks    Status  New    Target Date  07/24/17      PT SHORT TERM GOAL #5   Title  take baseline of 6 minute walk test and record    Time  4    Period  Weeks    Status  New    Target Date  07/24/17        PT Long Term Goals - 06/26/17 2158      PT LONG TERM GOAL #1   Title  independent with HEP and understand how to progress herself while knowing when it is alright to rest during chemotherapy treatment    Time  8    Period  Weeks    Status  New    Target Date  08/21/17      PT LONG TERM GOAL #2   Title  Berg balance score >/= 52/56 due to improve endurance and strength of lower extremities so she is able to live by herself with greater ease    Time  8    Period  Weeks    Status  New    Target Date  08/21/17      PT LONG TERM GOAL #3   Title  ability to exercise for 30  minutes with rate of perceived exertion between 13-16     Time  8    Period  Weeks    Status  New    Target Date  08/21/17      PT LONG TERM GOAL #4   Title  understand activity pacing/ conservation of energy due to possibe chemotherapy fatique    Time  8    Period  Weeks    Status  New    Target Date  08/21/17      PT LONG TERM GOAL #5   Title  6 minute walk test >/= 1413 feet due to being within age- matched means    Time  8    Period  Weeks    Status  New    Target Date  08/21/17            Plan - 07/02/17 1325    Clinical Impression Statement  Patient will be having chemotherapy with 2 weeks on and one week off.  Patient will be at the beach for one week.  Patient was instructed on HEP she can do at the beach and when on chemotherapy.  Patient has not met goals yet due to ust starting therapy.  Patient will benefit from skilled therapy to incresae strength and endurance while she is receiving chemotherapy and monitoring for knee pain and bone pain during her treatments.     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     PT Frequency  2x / week    PT Duration  8 weeks    PT Treatment/Interventions  Therapeutic activities;Therapeutic exercise;Balance training;Neuromuscular re-education;Patient/family education;Manual techniques;Energy conservation  PT Next Visit Plan  nustep; balance training ( tandem stance); hip and knee strength; endergy conservation; 6 minute walk  test    PT Home Exercise Plan  tips to reduce falls; information on osteoporosis; education on diarrhea    Consulted and Agree with Plan of Care  Patient;Family member/caregiver    Family Member Consulted  daugher in law       Patient will benefit from skilled therapeutic intervention in order to improve the following deficits and impairments:   Decreased balance, Pain, Decreased mobility, Decreased endurance, Decreased strength  Visit Diagnosis: Muscle weakness (generalized)  Other abnormalities of gait and mobility  Malignant neoplasm of head of pancreas [facility])     Problem List Patient Active Problem List   Diagnosis Date Noted  . Goals of care, counseling/discussion 06/29/2017  . Pancreatic cancer ([place]) 06/13/2017  . Newly diagnosed diabetes ([place]) 04/17/2017  . Osteopenia 03/27/2014  . History of uterine cancer 03/26/2013  . Cellulitis and abscess of hand 09/17/2011  . Dog bite(E906.0) 09/17/2011  . Hypothyroidism 09/17/2011  . HYPERLIPIDEMIA 03/16/2007  . ALLERGIC AND NONALLERGIC RHINITIS 03/16/2007  . Allergic-infective asthma 03/13/2007    Earlie Counts, PT 07/02/17 3:32 PM   [facility] [address] [place], [place], [address] Phone: [phone]   Fax:  [phone]  Name: ARIYANA FAW MRN: [id number] Date of Birth: [birth year]

## 2017-07-02 NOTE — Patient Instructions (Signed)
Access Code: [email or web address]  URL: [email or web address]  Date: 07/02/2017  Prepared by: Earlie Counts   Exercises  Single Leg Stance with Support - 5 reps - 1 sets - 10 hold - 1x daily - 7x weekly  Side Stepping with Counter Support - 5 reps - 1 sets - 1x daily - 7x weekly  Step Up - 10 reps - 3 sets - 1x daily - 7x weekly  Forward Step Down with Counter Support at Side - 10 reps - 1 sets - 1x daily - 7x weekly  Seated Knee Extension with Resistance - 20 reps - 1 sets - 1x daily - 7x weekly  Seated Hip Abduction with Resistance - 10 reps - 3 sets - 1x daily - 7x weekly  Seated March - 10 reps - 3 sets - 1x daily - 7x weekly  [facility] [address] [place], [place] [address] Phone # [phone] Fax [phone]

## 2017-07-04 ENCOUNTER — Ambulatory Visit: Payer: Medicare Other | Admitting: Physical Therapy

## 2017-07-04 ENCOUNTER — Encounter: Payer: Self-pay | Admitting: Physical Therapy

## 2017-07-04 DIAGNOSIS — R2689 Other abnormalities of gait and mobility: Secondary | ICD-10-CM

## 2017-07-04 DIAGNOSIS — C250 Malignant neoplasm of head of pancreas: Secondary | ICD-10-CM

## 2017-07-04 DIAGNOSIS — M6281 Muscle weakness (generalized): Secondary | ICD-10-CM | POA: Diagnosis not present

## 2017-07-04 NOTE — Therapy (Addendum)
[facility] [address], [place], [place], [address] Phone: [phone]   Fax:  [phone]  Physical Therapy Treatment  Patient Details  Name: Carly [name] MRN: [id number] Date of Birth: 08-Apr-1940 Referring Provider: Dr. Truitt Merle   Encounter Date: 07/04/2017  PT End of Session - 07/04/17 1524    Visit Number  3    Date for PT Re-Evaluation  08/21/17    Authorization Type  medicare    PT Start Time  5883    PT Stop Time  1523    PT Time Calculation (min)  38 min    Activity Tolerance  Patient tolerated treatment well    Behavior During Therapy  [facility] for tasks assessed/performed       Past Medical History:  Diagnosis Date  . Allergic rhinitis    Skin Test 04-14-2009  . Asthma   . Cancer ([place]) 1981   adenocarcinoma of uterus  . Diabetes mellitus without complication ([place])    type II, no meds per pt  . Hyperlipemia   . Hypertension   . Insomnia     Past Surgical History:  Procedure Laterality Date  . ABDOMINAL HYSTERECTOMY    . CARPAL TUNNEL RELEASE    . EUS N/A 06/13/2017   Procedure: FULL UPPER ENDOSCOPIC ULTRASOUND (EUS) RADIAL;  Surgeon: Arta Silence, MD;  [facility];  Service: Endoscopy;  Laterality: N/A;  . I&D EXTREMITY  09/17/2011   Procedure: IRRIGATION AND DEBRIDEMENT EXTREMITY;  Surgeon: Roseanne Kaufman, MD;  Location: [place];  Service: Orthopedics;  Laterality: Left;  with drain placement  . NASAL SEPTOPLASTY W/ TURBINOPLASTY    . pelvic floor rebuild    . TONSILLECTOMY    . TOTAL ABDOMINAL HYSTERECTOMY W/ BILATERAL SALPINGOOPHORECTOMY      There were no vitals filed for this visit.  Subjective Assessment - 07/04/17 1445    Subjective  The nustep made my legs weak.  I leave for the beach tomorrow. The HEP is fine.  The left shoulder is bothering me and I feel a little dizzy. The pain in my abdomen is due to tumor being close to an organ.     Patient Stated Goals  keep up  endurance while going through treatments while being careful of her knees.     Currently in Pain?  No/denies                       [facility] Adult PT Treatment/Exercise - 07/04/17 0001      Neuro Re-ed    Neuro Re-ed Details   tandem stance with one foot ahead of other 30 seconds 2 times both sides      Knee/Hip Exercises: Aerobic   Stationary Bike  6 min level 1      Knee/Hip Exercises: Standing   Other Standing Knee Exercises  heel raises 15x holding on; toe tap on higher step 20x holding on    Other Standing Knee Exercises  stand hip abduction 2# 10x each;       Knee/Hip Exercises: Seated   Long Arc Quad  Strengthening;Right;Left;3 sets;10 reps;Weights    Long Arc Quad Weight  2 lbs.    Ball Squeeze  10x hold 5 sec    Clamshell with TheraBand  Red 30x    Marching  Strengthening;Right;Left;3 sets;10 reps;Weights    Marching Weights  2 lbs.    Hamstring Curl  Right;Left;Strengthening;3 sets;10 reps red theraband  PT Short Term Goals - 07/04/17 1529      PT SHORT TERM GOAL #1   Title  independent with initial HEP    Time  4    Period  Weeks    Status  Achieved      PT SHORT TERM GOAL #4   Title  abilty to exercise 15 minutes in therapy at rate of perceived exertion between 13-16     Time  4    Period  Weeks    Status  Achieved        PT Long Term Goals - 06/26/17 2158      PT LONG TERM GOAL #1   Title  independent with HEP and understand how to progress herself while knowing when it is alright to rest during chemotherapy treatment    Time  8    Period  Weeks    Status  New    Target Date  08/21/17      PT LONG TERM GOAL #2   Title  Berg balance score >/= 52/56 due to improve endurance and strength of lower extremities so she is able to live by herself with greater ease    Time  8    Period  Weeks    Status  New    Target Date  08/21/17      PT LONG TERM GOAL #3   Title  ability to exercise for 30 minutes with rate of  perceived exertion between 13-16     Time  8    Period  Weeks    Status  New    Target Date  08/21/17      PT LONG TERM GOAL #4   Title  understand activity pacing/ conservation of energy due to possibe chemotherapy fatique    Time  8    Period  Weeks    Status  New    Target Date  08/21/17      PT LONG TERM GOAL #5   Title  6 minute walk test >/= 1413 feet due to being within age- matched means    Time  8    Period  Weeks    Status  New    Target Date  08/21/17            Plan - 07/04/17 1525    Clinical Impression Statement  Patient was a little dizzy today due to inner ear difficulties . Patient is leaving for the beach for 10 days then has her first chemotherapy treatmentfor 2 weeks.  Patient would hold onto things in therapy due to her balance being off because of her inner ear. No 6 min. walk test today due to feeling dizzy.  Patient is doing well with her HEP. Patient will benfit from skilled therapy to increase strength and edurance while she is receiving chemotherapy and monitoring for knee pain and bone pain during treatments.     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     PT Frequency  2x / week    PT Duration  8 weeks    PT Treatment/Interventions  Therapeutic activities;Therapeutic exercise;Balance training;Neuromuscular re-education;Patient/family education;Manual techniques;Energy conservation    PT Next Visit Plan   balance training ( tandem stance); hip and knee strength; endergy conservation; 6 minute walk  test; tips to reduce falls    PT Home Exercise Plan  tips to reduce falls; information on  osteoporosis; education on diarrhea    Recommended Other Services  MD signed initial eval    Consulted and Agree with Plan of Care  Patient       Patient will benefit from skilled therapeutic intervention in order to improve the following  deficits and impairments:  Decreased balance, Pain, Decreased mobility, Decreased endurance, Decreased strength  Visit Diagnosis: Muscle weakness (generalized)  Other abnormalities of gait and mobility  Malignant neoplasm of head of pancreas [facility])     Problem List Patient Active Problem List   Diagnosis Date Noted  . Goals of care, counseling/discussion 06/29/2017  . Pancreatic cancer ([place]) 06/13/2017  . Newly diagnosed diabetes ([place]) 04/17/2017  . Osteopenia 03/27/2014  . History of uterine cancer 03/26/2013  . Cellulitis and abscess of hand 09/17/2011  . Dog bite(E906.0) 09/17/2011  . Hypothyroidism 09/17/2011  . HYPERLIPIDEMIA 03/16/2007  . ALLERGIC AND NONALLERGIC RHINITIS 03/16/2007  . Allergic-infective asthma 03/13/2007    Earlie Counts, PT 07/04/17 3:31 PM   [facility] [address], [place], [place], [address] Phone: [phone]   Fax:  [phone]  Name: Carly [name] MRN: [id number] Date of Birth: [birth year]  PHYSICAL THERAPY DISCHARGE SUMMARY  Visits from Start of Care: 3  Current functional level related to goals / functional outcomes: See above.    Remaining deficits: See above. Unable to re-assess patient due to not returning to therapy after 07/04/2017.   Education / Equipment: HEP Plan:                                                    Patient goals were not met. Patient is being discharged due to not returning since the last visit. Thank you for the referral.Marisol Giambra Pearline Cables, PT 11/14/17 2:31 PM   ?????

## 2017-07-05 ENCOUNTER — Telehealth: Payer: Self-pay | Admitting: *Deleted

## 2017-07-17 ENCOUNTER — Encounter: Payer: Medicare Other | Admitting: Physical Therapy

## 2017-07-17 ENCOUNTER — Other Ambulatory Visit: Payer: Self-pay | Admitting: Radiology

## 2017-07-18 ENCOUNTER — Ambulatory Visit (HOSPITAL_COMMUNITY)
Admission: RE | Admit: 2017-07-18 | Discharge: 2017-07-18 | Disposition: A | Discharge status: Home / Self Care | Payer: Medicare Other | Source: Ambulatory Visit | Attending: Hematology | Admitting: Hematology

## 2017-07-18 ENCOUNTER — Encounter (HOSPITAL_COMMUNITY): Payer: Self-pay

## 2017-07-18 ENCOUNTER — Other Ambulatory Visit: Payer: Self-pay | Admitting: Hematology

## 2017-07-18 DIAGNOSIS — Z79890 Hormone replacement therapy (postmenopausal): Secondary | ICD-10-CM | POA: Diagnosis not present

## 2017-07-18 DIAGNOSIS — J45909 Unspecified asthma, uncomplicated: Secondary | ICD-10-CM | POA: Insufficient documentation

## 2017-07-18 DIAGNOSIS — Z87891 Personal history of nicotine dependence: Secondary | ICD-10-CM | POA: Diagnosis not present

## 2017-07-18 DIAGNOSIS — Z5111 Encounter for antineoplastic chemotherapy: Secondary | ICD-10-CM | POA: Diagnosis not present

## 2017-07-18 DIAGNOSIS — E785 Hyperlipidemia, unspecified: Secondary | ICD-10-CM | POA: Diagnosis not present

## 2017-07-18 DIAGNOSIS — Z888 Allergy status to other drugs, medicaments and biological substances status: Secondary | ICD-10-CM | POA: Insufficient documentation

## 2017-07-18 DIAGNOSIS — C250 Malignant neoplasm of head of pancreas: Secondary | ICD-10-CM | POA: Diagnosis not present

## 2017-07-18 DIAGNOSIS — Z90722 Acquired absence of ovaries, bilateral: Secondary | ICD-10-CM | POA: Insufficient documentation

## 2017-07-18 DIAGNOSIS — Z79899 Other long term (current) drug therapy: Secondary | ICD-10-CM | POA: Insufficient documentation

## 2017-07-18 DIAGNOSIS — Z8249 Family history of ischemic heart disease and other diseases of the circulatory system: Secondary | ICD-10-CM | POA: Diagnosis not present

## 2017-07-18 DIAGNOSIS — Z9889 Other specified postprocedural states: Secondary | ICD-10-CM | POA: Diagnosis not present

## 2017-07-18 DIAGNOSIS — E119 Type 2 diabetes mellitus without complications: Secondary | ICD-10-CM | POA: Insufficient documentation

## 2017-07-18 DIAGNOSIS — C259 Malignant neoplasm of pancreas, unspecified: Secondary | ICD-10-CM | POA: Diagnosis not present

## 2017-07-18 DIAGNOSIS — I10 Essential (primary) hypertension: Secondary | ICD-10-CM | POA: Insufficient documentation

## 2017-07-18 DIAGNOSIS — Z881 Allergy status to other antibiotic agents status: Secondary | ICD-10-CM | POA: Diagnosis not present

## 2017-07-18 DIAGNOSIS — Z823 Family history of stroke: Secondary | ICD-10-CM | POA: Diagnosis not present

## 2017-07-18 DIAGNOSIS — Z90710 Acquired absence of both cervix and uterus: Secondary | ICD-10-CM | POA: Diagnosis not present

## 2017-07-18 DIAGNOSIS — G4700 Insomnia, unspecified: Secondary | ICD-10-CM | POA: Diagnosis not present

## 2017-07-18 HISTORY — PX: IR US GUIDE VASC ACCESS RIGHT: IMG2390

## 2017-07-18 HISTORY — PX: IR FLUORO GUIDE PORT INSERTION RIGHT: IMG5741

## 2017-07-18 LAB — CBC WITH DIFFERENTIAL/PLATELET
Basophils Absolute: 0 10*3/uL (ref 0.0–0.1)
Basophils Relative: 0 %
EOS ABS: 0.1 10*3/uL (ref 0.0–0.7)
EOS PCT: 1 %
HCT: 37.5 % (ref 36.0–46.0)
Hemoglobin: 13.3 g/dL (ref 12.0–15.0)
LYMPHS ABS: 1.2 10*3/uL (ref 0.7–4.0)
LYMPHS PCT: 21 %
MCH: 32.1 pg (ref 26.0–34.0)
MCHC: 35.5 g/dL (ref 30.0–36.0)
MCV: 90.6 fL (ref 78.0–100.0)
MONOS PCT: 9 %
Monocytes Absolute: 0.5 10*3/uL (ref 0.1–1.0)
Neutro Abs: 4 10*3/uL (ref 1.7–7.7)
Neutrophils Relative %: 69 %
PLATELETS: 156 10*3/uL (ref 150–400)
RBC: 4.14 MIL/uL (ref 3.87–5.11)
RDW: 12.7 % (ref 11.5–15.5)
WBC: 5.8 10*3/uL (ref 4.0–10.5)

## 2017-07-18 LAB — GLUCOSE, CAPILLARY: Glucose-Capillary: 152 mg/dL — ABNORMAL HIGH (ref 65–99)

## 2017-07-18 LAB — PROTIME-INR
INR: 0.96
PROTHROMBIN TIME: 12.7 s (ref 11.4–15.2)

## 2017-07-18 MED ORDER — MIDAZOLAM HCL 2 MG/2ML IJ SOLN
INTRAMUSCULAR | Status: AC
  Filled 2017-07-18: qty 4

## 2017-07-18 MED ORDER — FENTANYL CITRATE (PF) 100 MCG/2ML IJ SOLN
INTRAMUSCULAR | Status: AC | PRN
  Administered 2017-07-18 (×2): 50 ug via INTRAVENOUS

## 2017-07-18 MED ORDER — LIDOCAINE HCL 1 % IJ SOLN
INTRAMUSCULAR | Status: AC
  Filled 2017-07-18: qty 20

## 2017-07-18 MED ORDER — HEPARIN SOD (PORK) LOCK FLUSH 100 UNIT/ML IV SOLN
INTRAVENOUS | Status: AC
  Filled 2017-07-18: qty 5

## 2017-07-18 MED ORDER — LIDOCAINE-EPINEPHRINE (PF) 2 %-1:200000 IJ SOLN
INTRAMUSCULAR | Status: AC | PRN
  Administered 2017-07-18: 10 mL via INTRADERMAL

## 2017-07-18 MED ORDER — FENTANYL CITRATE (PF) 100 MCG/2ML IJ SOLN
INTRAMUSCULAR | Status: AC
  Filled 2017-07-18: qty 2

## 2017-07-18 MED ORDER — CLINDAMYCIN PHOSPHATE 900 MG/50ML IV SOLN
900.0000 mg | Freq: Once | INTRAVENOUS | Status: AC
Start: 2017-07-18 — End: 2017-07-18
  Administered 2017-07-18: 900 mg via INTRAVENOUS

## 2017-07-18 MED ORDER — HEPARIN SOD (PORK) LOCK FLUSH 100 UNIT/ML IV SOLN
INTRAVENOUS | Status: AC | PRN
  Administered 2017-07-18: 500 [IU] via INTRAVENOUS

## 2017-07-18 MED ORDER — CLINDAMYCIN PHOSPHATE 900 MG/50ML IV SOLN
INTRAVENOUS | Status: AC
  Administered 2017-07-18: 900 mg via INTRAVENOUS
  Filled 2017-07-18: qty 50

## 2017-07-18 MED ORDER — SODIUM CHLORIDE 0.9 % IV SOLN
INTRAVENOUS | Status: DC
  Administered 2017-07-18: 08:00:00 via INTRAVENOUS

## 2017-07-18 MED ORDER — LIDOCAINE-EPINEPHRINE (PF) 2 %-1:200000 IJ SOLN
INTRAMUSCULAR | Status: AC
  Filled 2017-07-18: qty 20

## 2017-07-18 MED ORDER — MIDAZOLAM HCL 2 MG/2ML IJ SOLN
INTRAMUSCULAR | Status: AC | PRN
  Administered 2017-07-18 (×2): 1 mg via INTRAVENOUS

## 2017-07-18 MED ORDER — LIDOCAINE HCL (PF) 1 % IJ SOLN
INTRAMUSCULAR | Status: AC | PRN
  Administered 2017-07-18 (×2): 10 mL

## 2017-07-18 NOTE — Discharge Instructions (Addendum)
Moderate Conscious Sedation, Adult, Care After These instructions provide you with information about caring for yourself after your procedure. Your health care provider may also give you more specific instructions. Your treatment has been planned according to current medical practices, but problems sometimes occur. Call your health care provider if you have any problems or questions after your procedure. What can I expect after the procedure? After your procedure, it is common:  To feel sleepy for several hours.  To feel clumsy and have poor balance for several hours.  To have poor judgment for several hours.  To vomit if you eat too soon.  Follow these instructions at home: For at least 24 hours after the procedure:   Do not: ? Participate in activities where you could fall or become injured. ? Drive. ? Use heavy machinery. ? Drink alcohol. ? Take sleeping pills or medicines that cause drowsiness. ? Make important decisions or sign legal documents. ? Take care of children on your own.  Rest. Eating and drinking  Follow the diet recommended by your health care provider.  If you vomit: ? Drink water, juice, or soup when you can drink without vomiting. ? Make sure you have little or no nausea before eating solid foods. General instructions  Have a responsible adult stay with you until you are awake and alert.  Take over-the-counter and prescription medicines only as told by your health care provider.  If you smoke, do not smoke without supervision.  Keep all follow-up visits as told by your health care provider. This is important. Contact a health care provider if:  You keep feeling nauseous or you keep vomiting.  You feel light-headed.  You develop a rash.  You have a fever. Get help right away if:  You have trouble breathing. This information is not intended to replace advice given to you by your health care provider. Make sure you discuss any questions you have  with your health care provider. Document Released: 12/04/2012 Document Revised: 07/19/2015 Document Reviewed: 06/05/2015 Elsevier Interactive Patient Education  2018 [place] Insertion, Care After This sheet gives you information about how to care for yourself after your procedure. Your health care provider may also give you more specific instructions. If you have problems or questions, contact your health care provider. What can I expect after the procedure? After your procedure, it is common to have:  Discomfort at the port insertion site.  Bruising on the skin over the port. This should improve over 3-4 days.  Follow these instructions at home: [facility] care  After your port is placed, you will get a manufacturer's information card. The card has information about your port. Keep this card with you at all times.  Take care of the port as told by your health care provider. Ask your health care provider if you or a family member can get training for taking care of the port at home. A home health care nurse may also take care of the port.  Make sure to remember what type of port you have. Incision care  Follow instructions from your health care provider about how to take care of your port insertion site. Make sure you: ? Wash your hands with soap and water before you change your bandage (dressing). If soap and water are not available, use hand sanitizer. ? Change your dressing as told by your health care provider.  You may remove your dressing tomorrow. ? Leave skin glue, or adhesive strips in  place. These skin closures may need to stay in place for 2 weeks or longer. If adhesive strip edges start to loosen and curl up, you may trim the loose edges. Do not remove adhesive strips completely unless your health care provider tells you to do that.  DO NOT use EMLA cream for 2 weeks after port placement as this cream will remove surgical glue on your incision.  Check your  port insertion site every day for signs of infection. Check for: ? More redness, swelling, or pain. ? More fluid or blood. ? Warmth. ? Pus or a bad smell. General instructions  Do not take baths, swim, or use a hot tub until your health care provider approves.  You may shower tomorrow.  Do not lift anything that is heavier than 10 lb (4.5 kg) for a week, or as told by your health care provider.  Ask your health care provider when it is okay to: ? Return to work or school. ? Resume usual physical activities or sports.  Do not drive for 24 hours if you were given a medicine to help you relax (sedative).  Take over-the-counter and prescription medicines only as told by your health care provider.  Wear a medical alert bracelet in case of an emergency. This will tell any health care providers that you have a port.  Keep all follow-up visits as told by your health care provider. This is important. Contact a health care provider if:  You have a fever or chills.  You have more redness, swelling, or pain around your port insertion site.  You have more fluid or blood coming from your port insertion site.  Your port insertion site feels warm to the touch.  You have pus or a bad smell coming from the port insertion site. Get help right away if:  You have chest pain or shortness of breath.  You have bleeding from your port that you cannot control. Summary  Take care of the port as told by your health care provider.  Change your dressing as told by your health care provider.  Keep all follow-up visits as told by your health care provider. This information is not intended to replace advice given to you by your health care provider. Make sure you discuss any questions you have with your health care provider. Document Released: 12/04/2012 Document Revised: 01/05/2016 Document Reviewed: 01/05/2016 Elsevier Interactive Patient Education  2017 [employer].

## 2017-07-18 NOTE — Progress Notes (Signed)
Carly [name]  [phone] [phone]  Clinic Follow Up Note   Patient Care Team: Darcus Austin, MD as PCP - General (Family Medicine) Arta Silence, MD as Consulting Physician (Gastroenterology) Truitt Merle, MD as Consulting Physician (Hematology).  Date of Service:  07/19/2017  CHIEF COMPLAINTS:  F/u for Pancreatic Cancer, stage III   Oncology History   Cancer Staging Pancreatic cancer [facility]) Staging form: Exocrine Pancreas, AJCC 8th Edition - Clinical stage from 06/13/2017: Stage III (cT4, cN0, cM0) - Signed by Truitt Merle, MD on 06/13/2017       Pancreatic cancer ([place])   06/13/2017 Initial Diagnosis    Pancreatic cancer ([place])      06/13/2017 Cancer Staging    Staging form: Exocrine Pancreas, AJCC 8th Edition - Clinical stage from 06/13/2017: Stage III (cT4, cN0, cM0) - Signed by Truitt Merle, MD on 06/13/2017      06/13/2017 Procedure    EUS by Dr. Paulita Fujita 06/13/17  IMPRESSION:  -There was no sign of significant pathology in the ampulla. - There was no evidence of significant pathology in the left lobe of the liver. - A mass was identified in the pancreatic head. [place] SMV; invasion portal confluence; no adenopathy. Fine needle aspiration performed. If this is a pancreatic adenocarcinoma, it would be staged T4/N0/Mx by EUS.      06/13/2017 Initial Biopsy    Diagnosis 06/13/17 FINE NEEDLE ASPIRATION, ENDOSCOPIC, PANCREAS HEAD (SPECIMEN 1 OF 1 COLLECTED 06/13/17): ADENOCARCINOMA. Preliminary Diagnosis Intraoperative Diagnosis: 1-3) ATYPICAL CELLS, ADDITIONAL MATERIAL REQUESTED. (NDK) 4-6 ADENOCARCINOMA. (NDK) Reported to Dr. Paulita Fujita on 06/13/17 @ 11:07am.       06/21/2017 Imaging    CT Chest without contrast IMPRESSION: 1. No findings suspicious for metastatic disease within the chest.  2. Tiny subpleural right lower lobe pulmonary nodule is nonspecific, but likely benign. This can be addressed on follow-up imaging.  3. Aortic  Atherosclerosis (ICD10-I70.0).      07/19/2017 -  Chemotherapy    chemo FOLFIRINOX every 2 weeks starting 07/19/17         HISTORY OF PRESENTING ILLNESS: 06/14/17 Carly [name] 77 y.o. female is a here because of newly diagnosed Pancreatic Cancer. The patient was referred by Dr. Paulita Fujita. The patient presents to the clinic today accompanied by her 2 sons and her daughter-in-law.   She was having aching of her left abdominal starting 3 months ago after UTI. This pain comes and goes, which worsened at night. She denies issues with her appetite overall but she does not eat as much as she did due to the recent death of her husband. She denies any abdominal bloating. The color of urine has not become dark.   Today the patient notes she still has left abdominal pain. She notes she plans to see her grandson in [place] this weekend. She plans to go to the beach for 1-2 weeks in May, 2018. She prefers to be reached by her home phone and then her daughter-in-law.  Socially she lives by herself since her husband's passing in 12/2016. Her house is 1 level. She has support from her son and his wife. She has good family support. She has a stationary bike he rides 30 minutes a day and keeps track of her steps per day. She is very active and goes out often. 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   In the past the patient had a complete hysterectomy in early 1981 for bleeding. During her surgery  she had cancer cells found and had a BSO. She had no following treatments. She had a pelvic rebuilt in 1990s due to prolapse. She had a yearly pap smear. She is diagnosed os type II DM but is not on any medication at this time.  She takes tramadol for the pain as needed, trazodone to help her sleep and takes XANAX for her anxiety. She has been on this for years. She has been on Trazodone for 40 years to also help her anxiety. She takes Lipitor for her high cholesterol, not  regularly. She takes HCTZ for her fluid retention which she takes as needed. She takes Lisinopril for her HTN. She takes Synthroid for her hypothyroidism.  Her maternal grandmother had breast cancer. Her maternal cousin had breast cancer and her paternal cousin had a blood disorder. She smoked for 10-15 years of 1/2-1 pack a day before stopping in 1980s.   On review of symptoms, pt notes left abdominal ache which she tried tylenol and she was recently given Tramadol. This helped her some. She denies abdominal bloating, dark urine or jaundice and denies dysuria.  CURRENT THERAPY: chemo FOLFIRINOX every 2 weeks starting 07/19/17  INTERVAL HISTORY:   Carly [name] 77 y.o. presents to the office today accompanied by her daughter-in-law. She notes she is scared and nervous about starting chemo today. She notes she has been on lisinopril for about a month and she had an episode of dizziness while on it and HCTZ.   On review of symptoms, pt notes she urinated 3-4 times at night. She is not taking HCTZ everyday.    MEDICAL HISTORY:  Past Medical History:  Diagnosis Date  . Allergic rhinitis    Skin Test 04-14-2009  . Asthma   . 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 Diabetes mellitus without complication ([place])    type II, no meds per pt  . Hyperlipemia   . Hypertension   . Insomnia     SURGICAL HISTORY: Past Surgical History:  Procedure Laterality Date  . ABDOMINAL HYSTERECTOMY    . CARPAL TUNNEL RELEASE    . EUS N/A 06/13/2017   Procedure: FULL UPPER ENDOSCOPIC ULTRASOUND (EUS) RADIAL;  Surgeon: Arta Silence, MD;  Location: [facility];  Service: Endoscopy;  Laterality: N/A;  . I&D EXTREMITY  09/17/2011   Procedure: IRRIGATION AND DEBRIDEMENT EXTREMITY;  Surgeon: Roseanne Kaufman, MD;  Location: [place];  Service: Orthopedics;  Laterality: Left;  with drain placement  . IR FLUORO GUIDE PORT INSERTION RIGHT  07/18/2017  . IR US GUIDE VASC ACCESS RIGHT  07/18/2017  . NASAL  SEPTOPLASTY W/ TURBINOPLASTY    . pelvic floor rebuild    . TONSILLECTOMY    . TOTAL ABDOMINAL HYSTERECTOMY W/ BILATERAL SALPINGOOPHORECTOMY      SOCIAL HISTORY: Social History   Socioeconomic History  . Marital status: Widowed    Spouse name: Not on file  . Number of children: Not on file  . Years of education: Not on file  . Highest education level: Not on file  Occupational History  . Not on file  Social Needs  . Financial resource strain: Not on file  . Food insecurity:    Worry: Not on file    Inability: Not on file  . Transportation needs:    Medical: Not on file    Non-medical: Not on file  Tobacco Use  . Smoking status: Former Smoker    Packs/day: 0.50    Years: 15.00  Pack years: 7.50    Types: Cigarettes    Last attempt to quit: 02/27/1978    Years since quitting: 39.4  . Smokeless tobacco: Never Used  Substance and Sexual Activity  . Alcohol use: Yes    Alcohol/week: 0.0 oz    Comment: occ- glass of wine  . Drug use: No  . Sexual activity: Yes    Comment: 1st intercouse- 15, partners- 1  Lifestyle  . Physical activity:    Days per week: Not on file    Minutes per session: Not on file  . Stress: Not on file  Relationships  . Social connections:    Talks on phone: Not on file    Gets together: Not on file    Attends religious service: Not on file    Active member of club or organization: Not on file    Attends meetings of clubs or organizations: Not on file    Relationship status: Not on file  . Intimate partner violence:    Fear of current or ex partner: Not on file    Emotionally abused: Not on file    Physically abused: Not on file    Forced sexual activity: Not on file  Other Topics Concern  . Not on file  Social History Narrative  . Not on file    FAMILY HISTORY: Family History  Problem Relation Age of Onset  . Stroke Mother   . Heart attack Father   . Breast cancer Maternal Grandmother   . Breast cancer Cousin   . Cancer Cousin          oral cancer     ALLERGIES:  is allergic to cephalexin and nabumetone.  MEDICATIONS:  Current Outpatient Medications  Medication Sig Dispense Refill  . ALPRAZolam (XANAX) 0.5 MG tablet Take 1 tablet (0.5 mg total) by mouth at bedtime as needed for anxiety. 90 tablet 0  . Ascorbic Acid (VITAMIN C) 500 MG PACK Take 100 mg by mouth daily.    Marland Kitchen atorvastatin (LIPITOR) 10 MG tablet Take 10 mg by mouth at bedtime.     Marland Kitchen CALCIUM PO Take 1 tablet by mouth daily.     . Cholecalciferol (VITAMIN D) 2000 units CAPS Take by mouth.    . Cyanocobalamin (VITAMIN B-12 PO) Take 1 tablet by mouth daily.     . fluconazole (DIFLUCAN) 150 MG tablet Take one tablet now and repeat in 72 hours 2 tablet 0  . Glucosamine-Chondroit-Vit C-Mn (GLUCOSAMINE 1500 COMPLEX PO) Take 1 capsule by mouth daily.      . hydrochlorothiazide (HYDRODIURIL) 25 MG tablet Take 1 tablet by mouth daily as needed.     Marland Kitchen HYDROcodone-acetaminophen (NORCO) 5-325 MG tablet Take 1 tablet by mouth every 6 (six) hours as needed for moderate pain. 30 tablet 0  . hyoscyamine (LEVBID) 0.375 MG 12 hr tablet Take 0.375 mg by mouth every 12 (twelve) hours as needed for cramping.     . latanoprost (XALATAN) 0.005 % ophthalmic solution 1 drop at bedtime.    . lidocaine-prilocaine (EMLA) cream Apply to affected area once 30 g 3  . lisinopril (PRINIVIL,ZESTRIL) 10 MG tablet Take 10 mg by mouth at bedtime.    . Omega-3 Fatty Acids (FISH OIL PO) Take 1 capsule by mouth daily.     Marland Kitchen omeprazole (PRILOSEC) 20 MG capsule Take 1 tablet by mouth daily.    . ondansetron (ZOFRAN) 8 MG tablet Take 1 tablet (8 mg total) by mouth 2 (two) times daily as needed  for refractory nausea / vomiting. Start on day 3 after chemotherapy. 30 tablet 1  . ONETOUCH DELICA LANCETS 75T MISC USE TO TEST YOUR BLOOD SUGAR ONCE A DAY FASTING AND 2 HRS AFTER A MEAL AS NEEDED  3  . ONETOUCH VERIO test strip USE TO TEST YOUR BLOOD SUGAR ONCE A DAY FASTING & 2 HRS AFTER A MEAL AS NEEDED   3  . prochlorperazine (COMPAZINE) 10 MG tablet Take 1 tablet (10 mg total) by mouth every 6 (six) hours as needed (NAUSEA). 30 tablet 1  . traMADol (ULTRAM) 50 MG tablet Take 1-2 tablets every 6 hours as needed for pain 60 tablet 0  . traZODone (DESYREL) 100 MG tablet Take 1 tablet (100 mg total) by mouth at bedtime. (Patient taking differently: Take 50 mg by mouth at bedtime. ) 90 tablet 3  . levothyroxine (SYNTHROID, LEVOTHROID) 25 MCG tablet Take 25 mcg by mouth daily before breakfast.      No current facility-administered medications for this visit.     REVIEW OF SYSTEMS:   Constitutional: Denies fevers, chills or abnormal night sweats Eyes: Denies blurriness of vision, double vision or watery eyes Ears, nose, mouth, throat, and face: Denies mucositis or sore throat Respiratory: Denies cough, dyspnea or wheezes Cardiovascular: Denies palpitation, chest discomfort or lower extremity swelling Gastrointestinal:  Denies nausea, heartburn or change in bowel habits (+) left abdominal pain  Urinary: (+) Frequent urination at night  Skin: Denies abnormal skin rashes Lymphatics: Denies new lymphadenopathy or easy bruising Neurological:Denies numbness, tingling or new weaknesses Behavioral/Psych: Mood is stable, no new changes (+) anxiety, nervousness about chemo All other systems were reviewed with the patient and are negative.  PHYSICAL EXAMINATION: ECOG PERFORMANCE STATUS: 1 - Symptomatic but completely ambulatory  Vitals:   07/19/17 1001  BP: (!) 163/88  Pulse: 90  Resp: 18  Temp: 98.5 F (36.9 C)  SpO2: 98%   Filed Weights   07/19/17 1001  Weight: 147 lb 1.6 oz (66.7 kg)    GENERAL:alert, no distress and comfortable SKIN: skin color, texture, turgor are normal, no rashes or significant lesions EYES: normal, conjunctiva are pink and non-injected, sclera clear OROPHARYNX:no exudate, no erythema and lips, buccal mucosa, and tongue normal  NECK: supple, thyroid normal size,  non-tender, without nodularity LYMPH:  no palpable lymphadenopathy in the cervical, axillary or inguinal LUNGS: clear to auscultation and percussion with normal breathing effort HEART: regular rhythm and no murmurs and no lower extremity edema (+) increased heart rate ABDOMEN:abdomen soft, non-tender and normal bowel sounds Musculoskeletal:no cyanosis of digits and no clubbing  PSYCH: alert & oriented x 3 with fluent speech NEURO: no focal motor/sensory deficits  LABORATORY DATA:  I have reviewed the data as listed CBC Latest Ref Rng & Units 07/19/2017 07/18/2017 06/29/2017  WBC 3.9 - 10.3 K/uL 5.7 5.8 6.4  Hemoglobin 11.6 - 15.9 g/dL 13.1 13.3 13.9  Hematocrit 34.8 - 46.6 % 37.9 37.5 40.5  Platelets 145 - 400 K/uL 164 156 173    CMP Latest Ref Rng & Units 07/19/2017 06/29/2017 09/19/2011  Glucose 70 - 140 mg/dL 142(H) 225(H) 104(H)  BUN 7 - 26 mg/dL '16 15 11  ' Creatinine 0.60 - 1.10 mg/dL 0.75 0.87 0.80  Sodium 136 - 145 mmol/L 137 136 139  Potassium 3.5 - 5.1 mmol/L 4.0 4.6 4.0  Chloride 98 - 109 mmol/L 104 102 103  CO2 22 - 29 mmol/L '26 28 28  ' Calcium 8.4 - 10.4 mg/dL 9.5 9.9 9.4  Total Protein 6.4 -  8.3 g/dL 6.5 7.1 -  Total Bilirubin 0.2 - 1.2 mg/dL 0.8 0.4 -  Alkaline Phos 40 - 150 U/L 76 87 -  AST 5 - 34 U/L 18 15 -  ALT 0 - 55 U/L 20 11 -    PATHOLOGY   Diagnosis 06/13/17 FINE NEEDLE ASPIRATION, ENDOSCOPIC, PANCREAS HEAD (SPECIMEN 1 OF 1 COLLECTED 06/13/17): ADENOCARCINOMA. Preliminary Diagnosis Intraoperative Diagnosis: 1-3) ATYPICAL CELLS, ADDITIONAL MATERIAL REQUESTED. (NDK) 4-6 ADENOCARCINOMA. (NDK) Reported to Dr. Paulita Fujita on 06/13/17 @ 11:07am.    PROCEDURES  EUS by Dr. Paulita Fujita 06/13/17  IMPRESSION:  -There was no sign of significant pathology in the ampulla. - There was no evidence of significant pathology in the left lobe of the liver. - A mass was identified in the pancreatic head. [place] SMV; invasion portal confluence; no adenopathy. Fine needle aspiration  performed. If this is a pancreatic adenocarcinoma, it would be staged T4/N0/Mx by EUS.   RADIOGRAPHIC STUDIES: I have personally reviewed the radiological images as listed and agreed with the findings in the report. Ct Chest Wo Contrast  Result Date: 06/22/2017 CLINICAL DATA:  Newly diagnosed pancreatic cancer. Some left-sided chest pain. EXAM: CT CHEST WITHOUT CONTRAST TECHNIQUE: Multidetector CT imaging of the chest was performed following the standard protocol without IV contrast. COMPARISON:  Abdominopelvic CT 06/08/2017. FINDINGS: Cardiovascular: Atherosclerosis of the aorta, great vessels and coronary arteries. No acute vascular findings on noncontrast imaging. Possible calcifications of the aortic valve. There is a small amount of fluid within the superior pericardial recess. The heart size is normal. Mediastinum/Nodes: There are no enlarged mediastinal, hilar or axillary lymph nodes. The thyroid gland, trachea and esophagus demonstrate no significant findings. Lungs/Pleura: There is no pleural effusion. 4 mm subpleural right lower lobe nodule on image 88/5. No suspicious pulmonary nodules. There is mild linear atelectasis or scarring at the left lung base. Upper abdomen: Known pancreatic head mass is not well seen on this noncontrast study, although is grossly unchanged. Musculoskeletal/Chest wall: There is no chest wall mass or suspicious osseous finding. IMPRESSION: 1. No findings suspicious for metastatic disease within the chest. 2. Tiny subpleural right lower lobe pulmonary nodule is nonspecific, but likely benign. This can be addressed on follow-up imaging. 3.  Aortic Atherosclerosis (ICD10-I70.0). Electronically Signed   By: Richardean Sale M.D.   On: 06/22/2017 09:32   Ir US Guide Vasc Access Right  Result Date: 07/18/2017 INDICATION: 77 year old female with pancreatic cancer. She presents for port catheter placement for durable IV access prior to chemotherapy. EXAM: IMPLANTED PORT A CATH  PLACEMENT WITH ULTRASOUND AND FLUOROSCOPIC GUIDANCE MEDICATIONS: Clindamycin 900 mg IV; The antibiotic was administered within an appropriate time interval prior to skin puncture. ANESTHESIA/SEDATION: Versed 2 mg IV; Fentanyl 100 mcg IV; Moderate Sedation Time:  21 minutes The patient was continuously monitored during the procedure by the interventional radiology nurse under my direct supervision. FLUOROSCOPY TIME:  0 minutes, 42 seconds (4 mGy) COMPLICATIONS: None immediate. PROCEDURE: The right neck and chest was prepped with chlorhexidine, and draped in the usual sterile fashion using maximum barrier technique (cap and mask, sterile gown, sterile gloves, large sterile sheet, hand hygiene and cutaneous antiseptic). Antibiotic prophylaxis was provided with g Ancef administered IV one hour prior to skin incision. Local anesthesia was attained by infiltration with 1% lidocaine with epinephrine. Ultrasound demonstrated patency of the right internal jugular vein, and this was documented with an image. Under real-time ultrasound guidance, this vein was accessed with a 21 gauge micropuncture needle and image documentation was performed.  A small dermatotomy was made at the access site with an 11 scalpel. A 0.018" wire was advanced into the SVC and the access needle exchanged for a 90F micropuncture vascular sheath. The 0.018" wire was then removed and a 0.035" wire advanced into the IVC. An appropriate location for the subcutaneous reservoir was selected below the clavicle and an incision was made through the skin and underlying soft tissues. The subcutaneous tissues were then dissected using a combination of blunt and sharp surgical technique and a pocket was formed. A lumen power injectable portacatheter was then tunneled through the subcutaneous tissues from the pocket to the dermatotomy and the port reservoir placed within the subcutaneous pocket. The venous access site was then serially dilated and a peel away  vascular sheath placed over the wire. The wire was removed and the port catheter advanced into position under fluoroscopic guidance. The catheter tip is positioned in the upper right atrium. This was documented with a spot image. The portacatheter was then tested and found to flush and aspirate well. The port was flushed with saline followed by 100 units/mL heparinized saline. The pocket was then closed in two layers using first subdermal inverted interrupted absorbable sutures followed by a running subcuticular suture. The epidermis was then sealed with Dermabond. The dermatotomy at the venous access site was also sealed with Dermabond. IMPRESSION: Successful placement of a right IJ approach Power Port with ultrasound and fluoroscopic guidance. The catheter is ready for use. Signed, Criselda Peaches, MD [facility] Electronically Signed   By: Jacqulynn Cadet M.D.   On: 07/18/2017 10:21   Ir Fluoro Guide Port Insertion Right  Result Date: 07/18/2017 INDICATION: 77 year old female with pancreatic cancer. She presents for port catheter placement for durable IV access prior to chemotherapy. EXAM: IMPLANTED PORT A CATH PLACEMENT WITH ULTRASOUND AND FLUOROSCOPIC GUIDANCE MEDICATIONS: Clindamycin 900 mg IV; The antibiotic was administered within an appropriate time interval prior to skin puncture. ANESTHESIA/SEDATION: Versed 2 mg IV; Fentanyl 100 mcg IV; Moderate Sedation Time:  21 minutes The patient was continuously monitored during the procedure by the interventional radiology nurse under my direct supervision. FLUOROSCOPY TIME:  0 minutes, 42 seconds (4 mGy) COMPLICATIONS: None immediate. PROCEDURE: The right neck and chest was prepped with chlorhexidine, and draped in the usual sterile fashion using maximum barrier technique (cap and mask, sterile gown, sterile gloves, large sterile sheet, hand hygiene and cutaneous antiseptic). Antibiotic  prophylaxis was provided with g Ancef administered IV one hour prior to skin incision. Local anesthesia was attained by infiltration with 1% lidocaine with epinephrine. Ultrasound demonstrated patency of the right internal jugular vein, and this was documented with an image. Under real-time ultrasound guidance, this vein was accessed with a 21 gauge micropuncture needle and image documentation was performed. A small dermatotomy was made at the access site with an 11 scalpel. A 0.018" wire was advanced into the SVC and the access needle exchanged for a 90F micropuncture vascular sheath. The 0.018" wire was then removed and a 0.035" wire advanced into the IVC. An appropriate location for the subcutaneous reservoir was selected below the clavicle and an incision was made through the skin and underlying soft tissues. The subcutaneous tissues were then dissected using a combination of blunt and sharp surgical technique and a pocket was formed. A lumen power injectable portacatheter was then tunneled through the subcutaneous tissues from the pocket to the dermatotomy and the port reservoir placed within the subcutaneous pocket. The venous access  site was then serially dilated and a peel away vascular sheath placed over the wire. The wire was removed and the port catheter advanced into position under fluoroscopic guidance. The catheter tip is positioned in the upper right atrium. This was documented with a spot image. The portacatheter was then tested and found to flush and aspirate well. The port was flushed with saline followed by 100 units/mL heparinized saline. The pocket was then closed in two layers using first subdermal inverted interrupted absorbable sutures followed by a running subcuticular suture. The epidermis was then sealed with Dermabond. The dermatotomy at the venous access site was also sealed with Dermabond. IMPRESSION: Successful placement of a right IJ approach Power Port with ultrasound and fluoroscopic  guidance. The catheter is ready for use. Signed, Criselda Peaches, MD [facility] Radiology Electronically Signed   By: Jacqulynn Cadet M.D.   On: 07/18/2017 10:21    ASSESSMENT & PLAN:  Carly [name] is a 77 y.o. Caucasian female with a history of hypothyroidism, GERD, Anxiety, osteopenia, Glaucoma, HTN, hypercholesterolemia, and H/o Uterine Cancer, presented with intermittent upper abdominal pain    1. 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  -She has locally advanced Adenocarcinoma of Pancreatic Head.  The EUS showed a 3.0 x 3.0 cm mass in the pancreatic head, with sonographic evidence suggesting invasion into the superior mesenteric artery, the portal vein,, the SMV, and splenic vein.  Unfortunately this is likely unresectable tumor, or at least a borderline resectable disease due to the invasion of SMA. -Staging scan was negative for metastasis. -Her case was discussed in our GI tumor board.  Images were reviewed.  Our surgeon Dr. Barry Dienes concurs that her pancreatic cancer is not resectable. -We reviewed the natural history of pancreatic cancer, and discussed treatment options including chemotherapy and radiation, and the goal of therapy is palliative to prolong his life and improve his quality of life. -Given her overall good baseline health and PS, I recommend her to consider chemo. I think she is a candidate for intensive chemotherapy such as FOLFIRINOX every 2 weeks, or gemcitabine and Abraxane weekly for 2 weeks on and 1 week off. If she is not willing to go through intensive chemo, the single agent gemcitabine or palliative care alone would be options.   -After lengthy discussion with patient and her family members, she agrees to try chemotherapy FOLFIRINOX  -She had her PAC placement on 07/18/17. She attended the  chemo class  -I reviewed her antiemetics. She can take compazine today and for the next 3 days with each meals as needed. She can take Zofran as needed at least 3 days after treatment. I discussed side effects for each.  Other potential side effects from chemotherapy were also reviewed with her, she voiced good understanding.  She is very nervous about potential side effects from chemotherapy. -I recommend she not drink coffee or high volume of liquid at night as she has been having frequent urination at night. Will monitor.  -I disucssed it is best that she remove her nail acrylic and not frequently dye her hair during chemo. I discussed her nail color will change along with her palms and her hair will thin with chemotherapy.  -I explained if she develops fever or dehydration she should contact us or present to the ED.  -I explained her cold sensitivity may occur for  the first week at most. She should ovoid the cold for that first week.  -I discussed compazine, pain medication and Duanne Moron will make her drowsy and she should not take them at the exact time.  -Labs reveiwed and adequate to proceed with first cycle FOLFIRINOX today with dose reduction  -F/u in 1 week    2. Abdominal pain and weight loss -She has previously met with our dietician [place].  I encouraged her to take a nutritional supplement -She has been using tramadol as needed for pain. I previously prescribed hydrocodone for her, in case her pain gets worse during the trip. -I reveiwed her pain medication use. She can use Tramadol for moderate pain and hydrocodone for worse pain.   3. H/o Uterine Cancer, Stage I, diagnosed in 1981 -She had a total hysterectomy and BSO in 1981. She had no following treatments -Followed by her Gynecologist with yearly pap smears.   4. Genetic Testing  -Given her history of uterine cancer and family history of breast cancer she is eligible for genetic testing.  -She is interested. I previously  sent genetic counseling referral.   5. Social Support -She lives alone but has good familial support.  -I previously recommended that she has someone check on her weekly especially when on chemo treatment.  -I previously sent a SW referral and she has previously met with them  6. Goal of care discussion  -We again discussed the incurable nature of her cancer, and the overall poor prognosis, especially if she does not have good response to chemotherapy or progress on chemo -The patient understands the goal of care is palliative. -she is full code now   7. HTN, Hypothyroidism  -She is on HCTZ as needed and Lisinopril dialy  -She is on Levothroxine daily -I discussed with Chemo her BP may becomes low. I reomcmend if she develops this or dizziness I suggest she hold HCTZ first and continue to monitor her BP at home.  -Continue to Follow up with PCP for management and medication.   PLAN:  -I refilled her XANAX today  -Labs adequate to proceed with First cycle FOLFIRINOX today with dose reduction, neulasta injection on day 3  -Lab, flush and f/u in one week for toxicity checkup   No orders of the defined types were placed in this encounter.   All questions were answered. The patient knows to call the clinic with any problems, questions or concerns. I spent 25 minutes counseling the patient face to face. The total time spent in the appointment was 30 minutes and more than 50% was on counseling.     Truitt Merle, MD 07/19/2017   This document serves as a record of services personally performed by Truitt Merle, MD. It was created on her behalf by Joslyn Devon, a trained medical scribe. The creation of this record is based on the scribe's personal observations and the provider's statements to them.   I have reviewed the above documentation for accuracy and completeness, and I agree with the above.

## 2017-07-18 NOTE — Procedures (Signed)
Interventional Radiology Procedure Note  Procedure: Placement of a right IJ approach single lumen PowerPort.  Tip is positioned at the superior cavoatrial junction and catheter is ready for immediate use.  Complications: No immediate Recommendations:  - Ok to shower tomorrow - Do not submerge for 7 days - Routine line care   Signed,  Jalene Lacko K. Francelia Mclaren, MD   

## 2017-07-18 NOTE — Consult Note (Signed)
Chief Complaint: Patient was seen in consultation today for Port-A-Cath placement  Referring Physician(s): Feng,Yan  Supervising Physician: Jacqulynn Cadet  Patient Status: [facility] - Out-pt  History of Present Illness: Carly [name] is a 77 y.o. female with remote history of uterine cancer and now with newly diagnosed pancreatic cancer who presents today for Port-A-Cath placement for chemotherapy.  Past Medical History:  Diagnosis Date  . Allergic rhinitis    Skin Test 04-14-2009  . Asthma   . Cancer ([place]) 1981   adenocarcinoma of uterus  . Diabetes mellitus without complication ([place])    type II, no meds per pt  . Hyperlipemia   . Hypertension   . Insomnia     Past Surgical History:  Procedure Laterality Date  . ABDOMINAL HYSTERECTOMY    . CARPAL TUNNEL RELEASE    . EUS N/A 06/13/2017   Procedure: FULL UPPER ENDOSCOPIC ULTRASOUND (EUS) RADIAL;  Surgeon: Arta Silence, MD;  Location: [facility];  Service: Endoscopy;  Laterality: N/A;  . I&D EXTREMITY  09/17/2011   Procedure: IRRIGATION AND DEBRIDEMENT EXTREMITY;  Surgeon: Roseanne Kaufman, MD;  Location: [place];  Service: Orthopedics;  Laterality: Left;  with drain placement  . NASAL SEPTOPLASTY W/ TURBINOPLASTY    . pelvic floor rebuild    . TONSILLECTOMY    . TOTAL ABDOMINAL HYSTERECTOMY W/ BILATERAL SALPINGOOPHORECTOMY      Allergies: Cephalexin and Nabumetone  Medications: Prior to Admission medications   Medication Sig Start Date End Date Taking? Authorizing Provider  ALPRAZolam Duanne Moron) 0.5 MG tablet Take 1 tablet by mouth Once daily as needed for anxiety.  11/02/11  Yes [provider]  Ascorbic Acid (VITAMIN C) 500 MG PACK Take 100 mg by mouth daily.   Yes [provider]  atorvastatin (LIPITOR) 10 MG tablet Take 10 mg by mouth at bedtime.    Yes [provider]  Cholecalciferol (VITAMIN D) 2000 units CAPS Take by mouth.   Yes [provider]  fluconazole (DIFLUCAN) 150  MG tablet Take one tablet now and repeat in 72 hours 06/22/17  Yes Princess Bruins, MD  hydrochlorothiazide (HYDRODIURIL) 25 MG tablet Take 1 tablet by mouth daily as needed.  11/29/11  Yes [provider]  HYDROcodone-acetaminophen (NORCO) 5-325 MG tablet Take 1 tablet by mouth every 6 (six) hours as needed for moderate pain. 06/29/17  Yes Truitt Merle, MD  latanoprost (XALATAN) 0.005 % ophthalmic solution 1 drop at bedtime.   Yes [provider]  levothyroxine (SYNTHROID, LEVOTHROID) 25 MCG tablet Take 25 mcg by mouth daily before breakfast.    Yes [provider]  lisinopril (PRINIVIL,ZESTRIL) 10 MG tablet Take 10 mg by mouth at bedtime.   Yes [provider]  omeprazole (PRILOSEC) 20 MG capsule Take 1 tablet by mouth daily. 11/29/11  Yes [provider]  ONETOUCH DELICA LANCETS 26J MISC USE TO TEST YOUR BLOOD SUGAR ONCE A DAY FASTING AND 2 HRS AFTER A MEAL AS NEEDED 04/26/17  Yes [provider]  ONETOUCH VERIO test strip USE TO TEST YOUR BLOOD SUGAR ONCE A DAY FASTING & 2 HRS AFTER A MEAL AS NEEDED 04/26/17  Yes [provider]  traMADol (ULTRAM) 50 MG tablet Take 1-2 tablets every 6 hours as needed for pain 06/28/17  Yes Alla Feeling, NP  traZODone (DESYREL) 100 MG tablet Take 1 tablet (100 mg total) by mouth at bedtime. Patient taking differently: Take 50 mg by mouth at bedtime.  03/30/17  Yes Princess Bruins, MD  CALCIUM  PO Take 1 tablet by mouth daily.     [provider]  Cyanocobalamin (VITAMIN B-12 PO) Take 1 tablet by mouth daily.     [provider]  Glucosamine-Chondroit-Vit C-Mn (GLUCOSAMINE 1500 COMPLEX PO) Take 1 capsule by mouth daily.      [provider]  hyoscyamine (LEVBID) 0.375 MG 12 hr tablet Take 0.375 mg by mouth every 12 (twelve) hours as needed for cramping.     [provider]  lidocaine-prilocaine (EMLA) cream Apply to affected area once 06/29/17   Truitt Merle, MD  Omega-3 Fatty  Acids (FISH OIL PO) Take 1 capsule by mouth daily.     [provider]  ondansetron (ZOFRAN) 8 MG tablet Take 1 tablet (8 mg total) by mouth 2 (two) times daily as needed for refractory nausea / vomiting. Start on day 3 after chemotherapy. 06/29/17   Truitt Merle, MD  prochlorperazine (COMPAZINE) 10 MG tablet Take 1 tablet (10 mg total) by mouth every 6 (six) hours as needed (NAUSEA). 06/29/17   Truitt Merle, MD     Family History  Problem Relation Age of Onset  . Stroke Mother   . Heart attack Father   . Breast cancer Maternal Grandmother   . Breast cancer Cousin   . Cancer Cousin        oral cancer     Social History   Socioeconomic History  . Marital status: Widowed    Spouse name: Not on file  . Number of children: Not on file  . Years of education: Not on file  . Highest education level: Not on file  Occupational History  . Not on file  Social Needs  . Financial resource strain: Not on file  . Food insecurity:    Worry: Not on file    Inability: Not on file  . Transportation needs:    Medical: Not on file    Non-medical: Not on file  Tobacco Use  . Smoking status: Former Smoker    Packs/day: 0.50    Years: 15.00    Pack years: 7.50    Types: Cigarettes    Last attempt to quit: 02/27/1978    Years since quitting: 39.4  . Smokeless tobacco: Never Used  Substance and Sexual Activity  . Alcohol use: Yes    Alcohol/week: 0.0 oz    Comment: occ- glass of wine  . Drug use: No  . Sexual activity: Yes    Comment: 1st intercouse- 15, partners- 1  Lifestyle  . Physical activity:    Days per week: Not on file    Minutes per session: Not on file  . Stress: Not on file  Relationships  . Social connections:    Talks on phone: Not on file    Gets together: Not on file    Attends religious service: Not on file    Active member of club or organization: Not on file    Attends meetings of clubs or organizations: Not on file    Relationship status: Not on file  Other Topics  Concern  . Not on file  Social History Narrative  . Not on file      Review of Systems denies fever, headache, chest pain, dyspnea, cough, back pain, nausea, vomiting or bleeding.  She does have epigastric discomfort  Vital Signs: BP (!) 149/86 (BP Location: Right Arm)   Pulse 89   Temp 97.8 F (36.6 C) (Oral)   Resp 18   SpO2 99%   Physical Exam  awake, alert.  Chest clear to auscultation bilaterally.  Heart with regular rate and rhythm.  Abdomen soft, positive bowel sounds, mildly tender epigastric region to palpation.  No significant lower extremity edema.  Imaging: Ct Chest Wo Contrast  Result Date: 06/22/2017 CLINICAL DATA:  Newly diagnosed pancreatic cancer. Some left-sided chest pain. EXAM: CT CHEST WITHOUT CONTRAST TECHNIQUE: Multidetector CT imaging of the chest was performed following the standard protocol without IV contrast. COMPARISON:  Abdominopelvic CT 06/08/2017. FINDINGS: Cardiovascular: Atherosclerosis of the aorta, great vessels and coronary arteries. No acute vascular findings on noncontrast imaging. Possible calcifications of the aortic valve. There is a small amount of fluid within the superior pericardial recess. The heart size is normal. Mediastinum/Nodes: There are no enlarged mediastinal, hilar or axillary lymph nodes. The thyroid gland, trachea and esophagus demonstrate no significant findings. Lungs/Pleura: There is no pleural effusion. 4 mm subpleural right lower lobe nodule on image 88/5. No suspicious pulmonary nodules. There is mild linear atelectasis or scarring at the left lung base. Upper abdomen: Known pancreatic head mass is not well seen on this noncontrast study, although is grossly unchanged. Musculoskeletal/Chest wall: There is no chest wall mass or suspicious osseous finding. IMPRESSION: 1. No findings suspicious for metastatic disease within the chest. 2. Tiny subpleural right lower lobe pulmonary nodule is nonspecific, but likely benign. This can be  addressed on follow-up imaging. 3.  Aortic Atherosclerosis (ICD10-I70.0). Electronically Signed   By: Richardean Sale M.D.   On: 06/22/2017 09:32    Labs:  CBC: Recent Labs    06/29/17 0936 07/18/17 0745  WBC 6.4 5.8  HGB 13.9 13.3  HCT 40.5 37.5  PLT 173 156    COAGS: No results for input(s): INR, APTT in the last 8760 hours.  BMP: Recent Labs    06/29/17 0936  NA 136  K 4.6  CL 102  CO2 28  GLUCOSE 225*  BUN 15  CALCIUM 9.9  CREATININE 0.87  GFRNONAA >60  GFRAA >60    LIVER FUNCTION TESTS: Recent Labs    06/29/17 0936  BILITOT 0.4  AST 15  ALT 11  ALKPHOS 87  PROT 7.1  ALBUMIN 3.7    TUMOR MARKERS: No results for input(s): AFPTM, CEA, CA199, CHROMGRNA in the last 8760 hours.  Assessment and Plan: 77 y.o. female with remote history of uterine cancer and now with newly diagnosed pancreatic cancer who presents today for Port-A-Cath placement for chemotherapy.Risks and benefits of image guided port-a-catheter placement was discussed with the patient/daughter including, but not limited to bleeding, infection, pneumothorax, or fibrin sheath development and need for additional procedures.  All of the patient's questions were answered, patient is agreeable to proceed. Consent signed and in chart.     Thank you for this interesting consult.  I greatly enjoyed meeting Carly [name] and look forward to participating in their care.  A copy of this report was sent to the requesting provider on this date.  Electronically Signed: D. Rowe Robert, PA-C 07/18/2017, 8:46 AM   I spent a total of  25 minutes   in face to face in clinical consultation, greater than 50% of which was counseling/coordinating care for Port-A-Cath placement

## 2017-07-19 ENCOUNTER — Inpatient Hospital Stay (HOSPITAL_BASED_OUTPATIENT_CLINIC_OR_DEPARTMENT_OTHER): Payer: Medicare Other | Admitting: Hematology

## 2017-07-19 ENCOUNTER — Inpatient Hospital Stay: Payer: Medicare Other

## 2017-07-19 ENCOUNTER — Encounter: Payer: Self-pay | Admitting: Hematology

## 2017-07-19 ENCOUNTER — Encounter: Payer: Medicare Other | Admitting: Physical Therapy

## 2017-07-19 ENCOUNTER — Other Ambulatory Visit: Payer: Self-pay | Admitting: Hematology

## 2017-07-19 ENCOUNTER — Telehealth: Payer: Self-pay | Admitting: Hematology

## 2017-07-19 VITALS — BP 163/88 | HR 90 | Temp 98.5°F | Resp 18 | Ht 62.5 in | Wt 147.1 lb

## 2017-07-19 DIAGNOSIS — Z8542 Personal history of malignant neoplasm of other parts of uterus: Secondary | ICD-10-CM | POA: Diagnosis not present

## 2017-07-19 DIAGNOSIS — I10 Essential (primary) hypertension: Secondary | ICD-10-CM | POA: Diagnosis not present

## 2017-07-19 DIAGNOSIS — Z7189 Other specified counseling: Secondary | ICD-10-CM

## 2017-07-19 DIAGNOSIS — C250 Malignant neoplasm of head of pancreas: Secondary | ICD-10-CM

## 2017-07-19 DIAGNOSIS — M8580 Other specified disorders of bone density and structure, unspecified site: Secondary | ICD-10-CM | POA: Diagnosis not present

## 2017-07-19 DIAGNOSIS — Z5111 Encounter for antineoplastic chemotherapy: Secondary | ICD-10-CM | POA: Diagnosis not present

## 2017-07-19 DIAGNOSIS — H409 Unspecified glaucoma: Secondary | ICD-10-CM | POA: Diagnosis not present

## 2017-07-19 DIAGNOSIS — E039 Hypothyroidism, unspecified: Secondary | ICD-10-CM

## 2017-07-19 DIAGNOSIS — E7800 Pure hypercholesterolemia, unspecified: Secondary | ICD-10-CM | POA: Diagnosis not present

## 2017-07-19 DIAGNOSIS — Z95828 Presence of other vascular implants and grafts: Secondary | ICD-10-CM | POA: Insufficient documentation

## 2017-07-19 LAB — CMP (CANCER CENTER ONLY)
ALBUMIN: 3.6 g/dL (ref 3.5–5.0)
ALK PHOS: 76 U/L (ref 40–150)
ALT: 20 U/L (ref 0–55)
AST: 18 U/L (ref 5–34)
Anion gap: 7 (ref 3–11)
BILIRUBIN TOTAL: 0.8 mg/dL (ref 0.2–1.2)
BUN: 16 mg/dL (ref 7–26)
CO2: 26 mmol/L (ref 22–29)
Calcium: 9.5 mg/dL (ref 8.4–10.4)
Chloride: 104 mmol/L (ref 98–109)
Creatinine: 0.75 mg/dL (ref 0.60–1.10)
GFR, Est AFR Am: >60 mL/min (ref >60)
GFR, Estimated: >60 mL/min (ref >60)
GLUCOSE: 142 mg/dL — AB (ref 70–140)
POTASSIUM: 4 mmol/L (ref 3.5–5.1)
Sodium: 137 mmol/L (ref 136–145)
TOTAL PROTEIN: 6.5 g/dL (ref 6.4–8.3)

## 2017-07-19 LAB — CBC WITH DIFFERENTIAL (CANCER CENTER ONLY)
BASOS ABS: 0 10*3/uL (ref 0.0–0.1)
BASOS PCT: 1 %
Eosinophils Absolute: 0.1 10*3/uL (ref 0.0–0.5)
Eosinophils Relative: 1 %
HEMATOCRIT: 37.9 % (ref 34.8–46.6)
HEMOGLOBIN: 13.1 g/dL (ref 11.6–15.9)
Lymphocytes Relative: 19 %
Lymphs Abs: 1.1 10*3/uL (ref 0.9–3.3)
MCH: 31.2 pg (ref 25.1–34.0)
MCHC: 34.5 g/dL (ref 31.5–36.0)
MCV: 90.4 fL (ref 79.5–101.0)
Monocytes Absolute: 0.5 10*3/uL (ref 0.1–0.9)
Monocytes Relative: 9 %
NEUTROS ABS: 4 10*3/uL (ref 1.5–6.5)
NEUTROS PCT: 70 %
Platelet Count: 164 10*3/uL (ref 145–400)
RBC: 4.19 MIL/uL (ref 3.70–5.45)
RDW: 12.9 % (ref 11.2–14.5)
WBC Count: 5.7 10*3/uL (ref 3.9–10.3)

## 2017-07-19 LAB — CEA (IN HOUSE-CHCC): CEA (CHCC-In House): 3.11 ng/mL (ref 0.00–5.00)

## 2017-07-19 MED ORDER — PALONOSETRON HCL INJECTION 0.25 MG/5ML
0.2500 mg | Freq: Once | INTRAVENOUS | Status: AC
  Administered 2017-07-19: 0.25 mg via INTRAVENOUS

## 2017-07-19 MED ORDER — SODIUM CHLORIDE 0.9 % IV SOLN
2400.0000 mg/m2 | INTRAVENOUS | Status: DC
  Administered 2017-07-19: 4100 mg via INTRAVENOUS
  Filled 2017-07-19: qty 82

## 2017-07-19 MED ORDER — DEXTROSE 5 % IV SOLN
Freq: Once | INTRAVENOUS | Status: AC
  Administered 2017-07-19: 11:00:00 via INTRAVENOUS

## 2017-07-19 MED ORDER — DEXTROSE 5 % IV SOLN
75.0000 mg/m2 | Freq: Once | INTRAVENOUS | Status: AC
  Administered 2017-07-19: 130 mg via INTRAVENOUS
  Filled 2017-07-19: qty 20

## 2017-07-19 MED ORDER — DEXAMETHASONE SODIUM PHOSPHATE 10 MG/ML IJ SOLN
INTRAMUSCULAR | Status: AC
  Filled 2017-07-19: qty 1

## 2017-07-19 MED ORDER — ATROPINE SULFATE 1 MG/ML IJ SOLN
INTRAMUSCULAR | Status: AC
  Filled 2017-07-19: qty 1

## 2017-07-19 MED ORDER — DEXAMETHASONE SODIUM PHOSPHATE 10 MG/ML IJ SOLN
10.0000 mg | Freq: Once | INTRAMUSCULAR | Status: AC
  Administered 2017-07-19: 10 mg via INTRAVENOUS

## 2017-07-19 MED ORDER — ALPRAZOLAM 0.5 MG PO TABS: 0.5000 mg | ORAL_TABLET | Freq: Every evening | ORAL | 0 refills | Status: DC | PRN

## 2017-07-19 MED ORDER — SODIUM CHLORIDE 0.9% FLUSH
10.0000 mL | INTRAVENOUS | Status: DC | PRN
  Administered 2017-07-19: 10 mL
  Filled 2017-07-19: qty 10

## 2017-07-19 MED ORDER — PALONOSETRON HCL INJECTION 0.25 MG/5ML
INTRAVENOUS | Status: AC
Start: 2017-07-19 — End: ?
  Filled 2017-07-19: qty 5

## 2017-07-19 MED ORDER — LEUCOVORIN CALCIUM INJECTION 350 MG
400.0000 mg/m2 | Freq: Once | INTRAVENOUS | Status: AC
  Administered 2017-07-19: 680 mg via INTRAVENOUS
  Filled 2017-07-19: qty 34

## 2017-07-19 MED ORDER — IRINOTECAN HCL CHEMO INJECTION 100 MG/5ML
150.0000 mg/m2 | Freq: Once | INTRAVENOUS | Status: AC
  Administered 2017-07-19: 260 mg via INTRAVENOUS
  Filled 2017-07-19: qty 13

## 2017-07-19 MED ORDER — ATROPINE SULFATE 1 MG/ML IJ SOLN
0.5000 mg | Freq: Once | INTRAMUSCULAR | Status: AC | PRN
  Administered 2017-07-19: 0.5 mg via INTRAVENOUS

## 2017-07-19 NOTE — Telephone Encounter (Signed)
Appointments scheduled AVS/Calendar printed per 5/23 los °

## 2017-07-19 NOTE — Progress Notes (Signed)
  Oncology Nurse Navigator Documentation  Navigator Location: [facility] (07/19/17 1407)   )Navigator Encounter Type: Treatment (07/19/17 1407)                   Treatment Initiated Date: 07/19/17 (07/19/17 1407)   Treatment Phase: First Chemo Tx (07/19/17 1407) Barriers/Navigation Needs: Education (07/19/17 1407) Education: Pain/ Symptom Management (07/19/17 1407) Interventions: Education (07/19/17 1407) Reviewed use of nausea medications and signs and symptoms of infection.     Education Method: Verbal (07/19/17 1407)      Acuity: Level 2 (07/19/17 1407)   Acuity Level 2: Initial guidance, education and coordination as needed;Ongoing guidance and education throughout treatment as needed (07/19/17 1407)     Time Spent with Patient: 15 (07/19/17 1407)

## 2017-07-19 NOTE — Patient Instructions (Signed)
[facility] Discharge Instructions for Patients Receiving Chemotherapy  Today you received the following chemotherapy agents oxaliplatin, leucovoirn, irinotecan, and 5FU  To help prevent nausea and vomiting after your treatment, we encourage you to take your nausea medication as directed If you develop nausea and vomiting that is not controlled by your nausea medication, call the clinic.   BELOW ARE SYMPTOMS THAT SHOULD BE REPORTED IMMEDIATELY:  *FEVER GREATER THAN 100.5 F  *CHILLS WITH OR WITHOUT FEVER  NAUSEA AND VOMITING THAT IS NOT CONTROLLED WITH YOUR NAUSEA MEDICATION  *UNUSUAL SHORTNESS OF BREATH  *UNUSUAL BRUISING OR BLEEDING  TENDERNESS IN MOUTH AND THROAT WITH OR WITHOUT PRESENCE OF ULCERS  *URINARY PROBLEMS  *BOWEL PROBLEMS  UNUSUAL RASH Items with * indicate a potential emergency and should be followed up as soon as possible.  Feel free to call the clinic should you have any questions or concerns. The clinic phone number is [phone].  Please show the [facility] at check-in to the Emergency Department and triage nurse.   Oxaliplatin Injection What is this medicine? OXALIPLATIN (ox AL i PLA tin) is a chemotherapy drug. It targets fast dividing cells, like cancer cells, and causes these cells to die. This medicine is used to treat cancers of the colon and rectum, and many other cancers. This medicine may be used for other purposes; ask your health care provider or pharmacist if you have questions. COMMON BRAND NAME(S): Eloxatin What should I tell my health care provider before I take this medicine? They need to know if you have any of these conditions: -kidney disease -an unusual or allergic reaction to oxaliplatin, other chemotherapy, other medicines, foods, dyes, or preservatives -pregnant or trying to get pregnant -breast-feeding How should I use this medicine? This drug is given as an infusion into a vein. It is administered in a  hospital or clinic by a specially trained health care professional. Talk to your pediatrician regarding the use of this medicine in children. Special care may be needed. Overdosage: If you think you have taken too much of this medicine contact a poison control center or emergency room at once. NOTE: This medicine is only for you. Do not share this medicine with others. What if I miss a dose? It is important not to miss a dose. Call your doctor or health care professional if you are unable to keep an appointment. What may interact with this medicine? -medicines to increase blood counts like filgrastim, pegfilgrastim, sargramostim -probenecid -some antibiotics like amikacin, gentamicin, neomycin, polymyxin B, streptomycin, tobramycin -zalcitabine Talk to your doctor or health care professional before taking any of these medicines: -acetaminophen -aspirin -ibuprofen -ketoprofen -naproxen This list may not describe all possible interactions. Give your health care provider a list of all the medicines, herbs, non-prescription drugs, or dietary supplements you use. Also tell them if you smoke, drink alcohol, or use illegal drugs. Some items may interact with your medicine. What should I watch for while using this medicine? Your condition will be monitored carefully while you are receiving this medicine. You will need important blood work done while you are taking this medicine. This medicine can make you more sensitive to cold. Do not drink cold drinks or use ice. Cover exposed skin before coming in contact with cold temperatures or cold objects. When out in cold weather wear warm clothing and cover your mouth and nose to warm the air that goes into your lungs. Tell your doctor if you get sensitive to the cold.  This drug may make you feel generally unwell. This is not uncommon, as chemotherapy can affect healthy cells as well as cancer cells. Report any side effects. Continue your course of treatment  even though you feel ill unless your doctor tells you to stop. In some cases, you may be given additional medicines to help with side effects. Follow all directions for their use. Call your doctor or health care professional for advice if you get a fever, chills or sore throat, or other symptoms of a cold or flu. Do not treat yourself. This drug decreases your body's ability to fight infections. Try to avoid being around people who are sick. This medicine may increase your risk to bruise or bleed. Call your doctor or health care professional if you notice any unusual bleeding. Be careful brushing and flossing your teeth or using a toothpick because you may get an infection or bleed more easily. If you have any dental work done, tell your dentist you are receiving this medicine. Avoid taking products that contain aspirin, acetaminophen, ibuprofen, naproxen, or ketoprofen unless instructed by your doctor. These medicines may hide a fever. Do not become pregnant while taking this medicine. Women should inform their doctor if they wish to become pregnant or think they might be pregnant. There is a potential for serious side effects to an unborn child. Talk to your health care professional or pharmacist for more information. Do not breast-feed an infant while taking this medicine. Call your doctor or health care professional if you get diarrhea. Do not treat yourself. What side effects may I notice from receiving this medicine? Side effects that you should report to your doctor or health care professional as soon as possible: -allergic reactions like skin rash, itching or hives, swelling of the face, lips, or tongue -low blood counts - This drug may decrease the number of white blood cells, red blood cells and platelets. You may be at increased risk for infections and bleeding. -signs of infection - fever or chills, cough, sore throat, pain or difficulty passing urine -signs of decreased platelets or  bleeding - bruising, pinpoint red spots on the skin, black, tarry stools, nosebleeds -signs of decreased red blood cells - unusually weak or tired, fainting spells, lightheadedness -breathing problems -chest pain, pressure -cough -diarrhea -jaw tightness -mouth sores -nausea and vomiting -pain, swelling, redness or irritation at the injection site -pain, tingling, numbness in the hands or feet -problems with balance, talking, walking -redness, blistering, peeling or loosening of the skin, including inside the mouth -trouble passing urine or change in the amount of urine Side effects that usually do not require medical attention (report to your doctor or health care professional if they continue or are bothersome): -changes in vision -constipation -hair loss -loss of appetite -metallic taste in the mouth or changes in taste -stomach pain This list may not describe all possible side effects. Call your doctor for medical advice about side effects. You may report side effects to FDA at 1-800-FDA-1088. Where should I keep my medicine? This drug is given in a hospital or clinic and will not be stored at home. NOTE: This sheet is a summary. It may not cover all possible information. If you have questions about this medicine, talk to your doctor, pharmacist, or health care provider.  2018 Elsevier/Gold Standard (2007-09-10 17:22:47)   Leucovorin injection What is this medicine? LEUCOVORIN (loo koe VOR in) is used to prevent or treat the harmful effects of some medicines. This medicine is used to  treat anemia caused by a low amount of folic acid in the body. It is also used with 5-fluorouracil (5-FU) to treat colon cancer. This medicine may be used for other purposes; ask your health care provider or pharmacist if you have questions. What should I tell my health care provider before I take this medicine? They need to know if you have any of these conditions: -anemia from low levels of  vitamin B-12 in the blood -an unusual or allergic reaction to leucovorin, folic acid, other medicines, foods, dyes, or preservatives -pregnant or trying to get pregnant -breast-feeding How should I use this medicine? This medicine is for injection into a muscle or into a vein. It is given by a health care professional in a hospital or clinic setting. Talk to your pediatrician regarding the use of this medicine in children. Special care may be needed. Overdosage: If you think you have taken too much of this medicine contact a poison control center or emergency room at once. NOTE: This medicine is only for you. Do not share this medicine with others. What if I miss a dose? This does not apply. What may interact with this medicine? -capecitabine -fluorouracil -phenobarbital -phenytoin -primidone -trimethoprim-sulfamethoxazole This list may not describe all possible interactions. Give your health care provider a list of all the medicines, herbs, non-prescription drugs, or dietary supplements you use. Also tell them if you smoke, drink alcohol, or use illegal drugs. Some items may interact with your medicine. What should I watch for while using this medicine? Your condition will be monitored carefully while you are receiving this medicine. This medicine may increase the side effects of 5-fluorouracil, 5-FU. Tell your doctor or health care professional if you have diarrhea or mouth sores that do not get better or that get worse. What side effects may I notice from receiving this medicine? Side effects that you should report to your doctor or health care professional as soon as possible: -allergic reactions like skin rash, itching or hives, swelling of the face, lips, or tongue -breathing problems -fever, infection -mouth sores -unusual bleeding or bruising -unusually weak or tired Side effects that usually do not require medical attention (report to your doctor or health care professional if  they continue or are bothersome): -constipation or diarrhea -loss of appetite -nausea, vomiting This list may not describe all possible side effects. Call your doctor for medical advice about side effects. You may report side effects to FDA at 1-800-FDA-1088. Where should I keep my medicine? This drug is given in a hospital or clinic and will not be stored at home. NOTE: This sheet is a summary. It may not cover all possible information. If you have questions about this medicine, talk to your doctor, pharmacist, or health care provider.  2018 Elsevier/Gold Standard (2007-08-20 16:50:29)   Irinotecan injection What is this medicine? IRINOTECAN (ir in oh TEE kan ) is a chemotherapy drug. It is used to treat colon and rectal cancer. This medicine may be used for other purposes; ask your health care provider or pharmacist if you have questions. COMMON BRAND NAME(S): Camptosar What should I tell my health care provider before I take this medicine? They need to know if you have any of these conditions: -blood disorders -dehydration -diarrhea -infection (especially a virus infection such as chickenpox, cold sores, or herpes) -liver disease -low blood counts, like low white cell, platelet, or red cell counts -recent or ongoing radiation therapy -an unusual or allergic reaction to irinotecan, sorbitol, other chemotherapy, other  medicines, foods, dyes, or preservatives -pregnant or trying to get pregnant -breast-feeding How should I use this medicine? This drug is given as an infusion into a vein. It is administered in a hospital or clinic by a specially trained health care professional. Talk to your pediatrician regarding the use of this medicine in children. Special care may be needed. Overdosage: If you think you have taken too much of this medicine contact a poison control center or emergency room at once. NOTE: This medicine is only for you. Do not share this medicine with others. What  if I miss a dose? It is important not to miss your dose. Call your doctor or health care professional if you are unable to keep an appointment. What may interact with this medicine? Do not take this medicine with any of the following medications: -atazanavir -certain medicines for fungal infections like itraconazole and ketoconazole -St. John's Wort This medicine may also interact with the following medications: -dexamethasone -diuretics -laxatives -medicines for seizures like carbamazepine, mephobarbital, phenobarbital, phenytoin, primidone -medicines to increase blood counts like filgrastim, pegfilgrastim, sargramostim -prochlorperazine -vaccines This list may not describe all possible interactions. Give your health care provider a list of all the medicines, herbs, non-prescription drugs, or dietary supplements you use. Also tell them if you smoke, drink alcohol, or use illegal drugs. Some items may interact with your medicine. What should I watch for while using this medicine? Your condition will be monitored carefully while you are receiving this medicine. You will need important blood work done while you are taking this medicine. This drug may make you feel generally unwell. This is not uncommon, as chemotherapy can affect healthy cells as well as cancer cells. Report any side effects. Continue your course of treatment even though you feel ill unless your doctor tells you to stop. In some cases, you may be given additional medicines to help with side effects. Follow all directions for their use. You may get drowsy or dizzy. Do not drive, use machinery, or do anything that needs mental alertness until you know how this medicine affects you. Do not stand or sit up quickly, especially if you are an older patient. This reduces the risk of dizzy or fainting spells. Call your doctor or health care professional for advice if you get a fever, chills or sore throat, or other symptoms of a cold or  flu. Do not treat yourself. This drug decreases your body's ability to fight infections. Try to avoid being around people who are sick. This medicine may increase your risk to bruise or bleed. Call your doctor or health care professional if you notice any unusual bleeding. Be careful brushing and flossing your teeth or using a toothpick because you may get an infection or bleed more easily. If you have any dental work done, tell your dentist you are receiving this medicine. Avoid taking products that contain aspirin, acetaminophen, ibuprofen, naproxen, or ketoprofen unless instructed by your doctor. These medicines may hide a fever. Do not become pregnant while taking this medicine. Women should inform their doctor if they wish to become pregnant or think they might be pregnant. There is a potential for serious side effects to an unborn child. Talk to your health care professional or pharmacist for more information. Do not breast-feed an infant while taking this medicine. What side effects may I notice from receiving this medicine? Side effects that you should report to your doctor or health care professional as soon as possible: -allergic reactions like skin rash,  itching or hives, swelling of the face, lips, or tongue -low blood counts - this medicine may decrease the number of white blood cells, red blood cells and platelets. You may be at increased risk for infections and bleeding. -signs of infection - fever or chills, cough, sore throat, pain or difficulty passing urine -signs of decreased platelets or bleeding - bruising, pinpoint red spots on the skin, black, tarry stools, blood in the urine -signs of decreased red blood cells - unusually weak or tired, fainting spells, lightheadedness -breathing problems -chest pain -diarrhea -feeling faint or lightheaded, falls -flushing, runny nose, sweating during infusion -mouth sores or pain -pain, swelling, redness or irritation where  injected -pain, swelling, warmth in the leg -pain, tingling, numbness in the hands or feet -problems with balance, talking, walking -stomach cramps, pain -trouble passing urine or change in the amount of urine -vomiting as to be unable to hold down drinks or food -yellowing of the eyes or skin Side effects that usually do not require medical attention (report to your doctor or health care professional if they continue or are bothersome): -constipation -hair loss -headache -loss of appetite -nausea, vomiting -stomach upset This list may not describe all possible side effects. Call your doctor for medical advice about side effects. You may report side effects to FDA at 1-800-FDA-1088. Where should I keep my medicine? This drug is given in a hospital or clinic and will not be stored at home. NOTE: This sheet is a summary. It may not cover all possible information. If you have questions about this medicine, talk to your doctor, pharmacist, or health care provider.  2018 Elsevier/Gold Standard (2012-08-12 16:29:32)   Fluorouracil, 5-FU injection What is this medicine? FLUOROURACIL, 5-FU (flure oh YOOR a sil) is a chemotherapy drug. It slows the growth of cancer cells. This medicine is used to treat many types of cancer like breast cancer, colon or rectal cancer, pancreatic cancer, and stomach cancer. This medicine may be used for other purposes; ask your health care provider or pharmacist if you have questions. COMMON BRAND NAME(S): Adrucil What should I tell my health care provider before I take this medicine? They need to know if you have any of these conditions: -blood disorders -dihydropyrimidine dehydrogenase (DPD) deficiency -infection (especially a virus infection such as chickenpox, cold sores, or herpes) -kidney disease -liver disease -malnourished, poor nutrition -recent or ongoing radiation therapy -an unusual or allergic reaction to fluorouracil, other chemotherapy, other  medicines, foods, dyes, or preservatives -pregnant or trying to get pregnant -breast-feeding How should I use this medicine? This drug is given as an infusion or injection into a vein. It is administered in a hospital or clinic by a specially trained health care professional. Talk to your pediatrician regarding the use of this medicine in children. Special care may be needed. Overdosage: If you think you have taken too much of this medicine contact a poison control center or emergency room at once. NOTE: This medicine is only for you. Do not share this medicine with others. What if I miss a dose? It is important not to miss your dose. Call your doctor or health care professional if you are unable to keep an appointment. What may interact with this medicine? -allopurinol -cimetidine -dapsone -digoxin -hydroxyurea -leucovorin -levamisole -medicines for seizures like ethotoin, fosphenytoin, phenytoin -medicines to increase blood counts like filgrastim, pegfilgrastim, sargramostim -medicines that treat or prevent blood clots like warfarin, enoxaparin, and dalteparin -methotrexate -metronidazole -pyrimethamine -some other chemotherapy drugs like busulfan, cisplatin, estramustine,  vinblastine -trimethoprim -trimetrexate -vaccines Talk to your doctor or health care professional before taking any of these medicines: -acetaminophen -aspirin -ibuprofen -ketoprofen -naproxen This list may not describe all possible interactions. Give your health care provider a list of all the medicines, herbs, non-prescription drugs, or dietary supplements you use. Also tell them if you smoke, drink alcohol, or use illegal drugs. Some items may interact with your medicine. What should I watch for while using this medicine? Visit your doctor for checks on your progress. This drug may make you feel generally unwell. This is not uncommon, as chemotherapy can affect healthy cells as well as cancer cells. Report  any side effects. Continue your course of treatment even though you feel ill unless your doctor tells you to stop. In some cases, you may be given additional medicines to help with side effects. Follow all directions for their use. Call your doctor or health care professional for advice if you get a fever, chills or sore throat, or other symptoms of a cold or flu. Do not treat yourself. This drug decreases your body's ability to fight infections. Try to avoid being around people who are sick. This medicine may increase your risk to bruise or bleed. Call your doctor or health care professional if you notice any unusual bleeding. Be careful brushing and flossing your teeth or using a toothpick because you may get an infection or bleed more easily. If you have any dental work done, tell your dentist you are receiving this medicine. Avoid taking products that contain aspirin, acetaminophen, ibuprofen, naproxen, or ketoprofen unless instructed by your doctor. These medicines may hide a fever. Do not become pregnant while taking this medicine. Women should inform their doctor if they wish to become pregnant or think they might be pregnant. There is a potential for serious side effects to an unborn child. Talk to your health care professional or pharmacist for more information. Do not breast-feed an infant while taking this medicine. Men should inform their doctor if they wish to father a child. This medicine may lower sperm counts. Do not treat diarrhea with over the counter products. Contact your doctor if you have diarrhea that lasts more than 2 days or if it is severe and watery. This medicine can make you more sensitive to the sun. Keep out of the sun. If you cannot avoid being in the sun, wear protective clothing and use sunscreen. Do not use sun lamps or tanning beds/booths. What side effects may I notice from receiving this medicine? Side effects that you should report to your doctor or health care  professional as soon as possible: -allergic reactions like skin rash, itching or hives, swelling of the face, lips, or tongue -low blood counts - this medicine may decrease the number of white blood cells, red blood cells and platelets. You may be at increased risk for infections and bleeding. -signs of infection - fever or chills, cough, sore throat, pain or difficulty passing urine -signs of decreased platelets or bleeding - bruising, pinpoint red spots on the skin, black, tarry stools, blood in the urine -signs of decreased red blood cells - unusually weak or tired, fainting spells, lightheadedness -breathing problems -changes in vision -chest pain -mouth sores -nausea and vomiting -pain, swelling, redness at site where injected -pain, tingling, numbness in the hands or feet -redness, swelling, or sores on hands or feet -stomach pain -unusual bleeding Side effects that usually do not require medical attention (report to your doctor or health care professional if  they continue or are bothersome): -changes in finger or toe nails -diarrhea -dry or itchy skin -hair loss -headache -loss of appetite -sensitivity of eyes to the light -stomach upset -unusually teary eyes This list may not describe all possible side effects. Call your doctor for medical advice about side effects. You may report side effects to FDA at 1-800-FDA-1088. Where should I keep my medicine? This drug is given in a hospital or clinic and will not be stored at home. NOTE: This sheet is a summary. It may not cover all possible information. If you have questions about this medicine, talk to your doctor, pharmacist, or health care provider.  2018 Elsevier/Gold Standard (2007-06-19 13:53:16)

## 2017-07-20 ENCOUNTER — Telehealth: Payer: Self-pay

## 2017-07-20 LAB — CANCER ANTIGEN 19-9: CAN 19-9: 1436 U/mL — AB (ref 0–35)

## 2017-07-20 NOTE — Telephone Encounter (Signed)
-----   Message from Murvin Natal, RN sent at 07/19/2017  4:10 PM EDT ----- Regarding: pt of dr. Burr Medico first time chemo follow up call Pt of Dr. Burr Medico first time oxaliplatin, leucovorin, irinotecan and 5FU pump follow up call.  Pt tolerated treatment well.

## 2017-07-20 NOTE — Telephone Encounter (Signed)
Left voice message for patient as a follow up to her chemo treatment yesterday.    Encouraged her to call us back if she has concerns or questions.

## 2017-07-21 ENCOUNTER — Inpatient Hospital Stay: Payer: Medicare Other

## 2017-07-21 VITALS — BP 129/76 | HR 116 | Temp 98.6°F | Resp 18

## 2017-07-21 DIAGNOSIS — Z7189 Other specified counseling: Secondary | ICD-10-CM

## 2017-07-21 DIAGNOSIS — H409 Unspecified glaucoma: Secondary | ICD-10-CM | POA: Diagnosis not present

## 2017-07-21 DIAGNOSIS — C250 Malignant neoplasm of head of pancreas: Secondary | ICD-10-CM

## 2017-07-21 DIAGNOSIS — I10 Essential (primary) hypertension: Secondary | ICD-10-CM | POA: Diagnosis not present

## 2017-07-21 DIAGNOSIS — E039 Hypothyroidism, unspecified: Secondary | ICD-10-CM | POA: Diagnosis not present

## 2017-07-21 DIAGNOSIS — E7800 Pure hypercholesterolemia, unspecified: Secondary | ICD-10-CM | POA: Diagnosis not present

## 2017-07-21 DIAGNOSIS — Z5111 Encounter for antineoplastic chemotherapy: Secondary | ICD-10-CM | POA: Diagnosis not present

## 2017-07-21 MED ORDER — HEPARIN SOD (PORK) LOCK FLUSH 100 UNIT/ML IV SOLN
500.0000 [IU] | Freq: Once | INTRAVENOUS | Status: AC | PRN
  Administered 2017-07-21: 500 [IU]
  Filled 2017-07-21: qty 5

## 2017-07-21 MED ORDER — PEGFILGRASTIM INJECTION 6 MG/0.6ML ~~LOC~~
6.0000 mg | PREFILLED_SYRINGE | Freq: Once | SUBCUTANEOUS | Status: AC
  Administered 2017-07-21: 6 mg via SUBCUTANEOUS

## 2017-07-21 MED ORDER — PEGFILGRASTIM INJECTION 6 MG/0.6ML ~~LOC~~
PREFILLED_SYRINGE | SUBCUTANEOUS | Status: AC
  Filled 2017-07-21: qty 0.6

## 2017-07-21 MED ORDER — SODIUM CHLORIDE 0.9% FLUSH
10.0000 mL | INTRAVENOUS | Status: DC | PRN
  Administered 2017-07-21: 10 mL
  Filled 2017-07-21: qty 10

## 2017-07-21 NOTE — Patient Instructions (Signed)
Pegfilgrastim injection What is this medicine? PEGFILGRASTIM (PEG fil gra stim) is a long-acting granulocyte colony-stimulating factor that stimulates the growth of neutrophils, a type of white blood cell important in the body's fight against infection. It is used to reduce the incidence of fever and infection in patients with certain types of cancer who are receiving chemotherapy that affects the bone marrow, and to increase survival after being exposed to high doses of radiation. This medicine may be used for other purposes; ask your health care provider or pharmacist if you have questions. COMMON BRAND NAME(S): Neulasta What should I tell my health care provider before I take this medicine? They need to know if you have any of these conditions: -kidney disease -latex allergy -ongoing radiation therapy -sickle cell disease -skin reactions to acrylic adhesives (On-Body Injector only) -an unusual or allergic reaction to pegfilgrastim, filgrastim, other medicines, foods, dyes, or preservatives -pregnant or trying to get pregnant -breast-feeding How should I use this medicine? This medicine is for injection under the skin. If you get this medicine at home, you will be taught how to prepare and give the pre-filled syringe or how to use the On-body Injector. Refer to the patient Instructions for Use for detailed instructions. Use exactly as directed. Tell your healthcare provider immediately if you suspect that the On-body Injector may not have performed as intended or if you suspect the use of the On-body Injector resulted in a missed or partial dose. It is important that you put your used needles and syringes in a special sharps container. Do not put them in a trash can. If you do not have a sharps container, call your pharmacist or healthcare provider to get one. Talk to your pediatrician regarding the use of this medicine in children. While this drug may be prescribed for selected conditions,  precautions do apply. Overdosage: If you think you have taken too much of this medicine contact a poison control center or emergency room at once. NOTE: This medicine is only for you. Do not share this medicine with others. What if I miss a dose? It is important not to miss your dose. Call your doctor or health care professional if you miss your dose. If you miss a dose due to an On-body Injector failure or leakage, a new dose should be administered as soon as possible using a single prefilled syringe for manual use. What may interact with this medicine? Interactions have not been studied. Give your health care provider a list of all the medicines, herbs, non-prescription drugs, or dietary supplements you use. Also tell them if you smoke, drink alcohol, or use illegal drugs. Some items may interact with your medicine. This list may not describe all possible interactions. Give your health care provider a list of all the medicines, herbs, non-prescription drugs, or dietary supplements you use. Also tell them if you smoke, drink alcohol, or use illegal drugs. Some items may interact with your medicine. What should I watch for while using this medicine? You may need blood work done while you are taking this medicine. If you are going to need a MRI, CT scan, or other procedure, tell your doctor that you are using this medicine (On-Body Injector only). What side effects may I notice from receiving this medicine? Side effects that you should report to your doctor or health care professional as soon as possible: -allergic reactions like skin rash, itching or hives, swelling of the face, lips, or tongue -dizziness -fever -pain, redness, or irritation at site   where injected -pinpoint red spots on the skin -red or dark-brown urine -shortness of breath or breathing problems -stomach or side pain, or pain at the shoulder -swelling -tiredness -trouble passing urine or change in the amount of urine Side  effects that usually do not require medical attention (report to your doctor or health care professional if they continue or are bothersome): -bone pain -muscle pain This list may not describe all possible side effects. Call your doctor for medical advice about side effects. You may report side effects to FDA at 1-800-FDA-1088. Where should I keep my medicine? Keep out of the reach of children. Store pre-filled syringes in a refrigerator between 2 and 8 degrees C (36 and 46 degrees F). Do not freeze. Keep in carton to protect from light. Throw away this medicine if it is left out of the refrigerator for more than 48 hours. Throw away any unused medicine after the expiration date. NOTE: This sheet is a summary. It may not cover all possible information. If you have questions about this medicine, talk to your doctor, pharmacist, or health care provider.  2018 [employer] (2016-02-10 12:58:03)  

## 2017-07-25 NOTE — Progress Notes (Signed)
[place]  [phone] [phone]  Clinic Follow Up Note   Patient Care Team: Darcus Austin, MD as PCP - General (Family Medicine) Arta Silence, MD as Consulting Physician (Gastroenterology) Truitt Merle, MD as Consulting Physician (Hematology).  Date of Service:  07/26/2017  CHIEF COMPLAINTS:  F/u for Pancreatic Cancer, stage III   Oncology History   Cancer Staging Pancreatic cancer [facility]) Staging form: Exocrine Pancreas, AJCC 8th Edition - Clinical stage from 06/13/2017: Stage III (cT4, cN0, cM0) - Signed by Truitt Merle, MD on 06/13/2017       Pancreatic cancer ([place])   06/13/2017 Initial Diagnosis    Pancreatic cancer ([place])      06/13/2017 Cancer Staging    Staging form: Exocrine Pancreas, AJCC 8th Edition - Clinical stage from 06/13/2017: Stage III (cT4, cN0, cM0) - Signed by Truitt Merle, MD on 06/13/2017      06/13/2017 Procedure    EUS by Dr. Paulita Fujita 06/13/17  IMPRESSION:  -There was no sign of significant pathology in the ampulla. - There was no evidence of significant pathology in the left lobe of the liver. - A mass was identified in the pancreatic head. [place] SMV; invasion portal confluence; no adenopathy. Fine needle aspiration performed. If this is a pancreatic adenocarcinoma, it would be staged T4/N0/Mx by EUS.      06/13/2017 Initial Biopsy    Diagnosis 06/13/17 FINE NEEDLE ASPIRATION, ENDOSCOPIC, PANCREAS HEAD (SPECIMEN 1 OF 1 COLLECTED 06/13/17): ADENOCARCINOMA. Preliminary Diagnosis Intraoperative Diagnosis: 1-3) ATYPICAL CELLS, ADDITIONAL MATERIAL REQUESTED. (NDK) 4-6 ADENOCARCINOMA. (NDK) Reported to Dr. Paulita Fujita on 06/13/17 @ 11:07am.       06/21/2017 Imaging    CT Chest without contrast IMPRESSION: 1. No findings suspicious for metastatic disease within the chest.  2. Tiny subpleural right lower lobe pulmonary nodule is nonspecific, but likely benign. This can be addressed on follow-up imaging.  3. Aortic  Atherosclerosis (ICD10-I70.0).      07/19/2017 -  Chemotherapy    chemo FOLFIRINOX every 2 weeks starting 07/19/17         HISTORY OF PRESENTING ILLNESS: 06/14/17 Carly [name] 77 y.o. female is a here because of newly diagnosed Pancreatic Cancer. The patient was referred by Dr. Paulita Fujita. The patient presents to the clinic today accompanied by her 2 sons and her daughter-in-law.   She was having aching of her left abdominal starting 3 months ago after UTI. This pain comes and goes, which worsened at night. She denies issues with her appetite overall but she does not eat as much as she did due to the recent death of her husband. She denies any abdominal bloating. The color of urine has not become dark.   Today the patient notes she still has left abdominal pain. She notes she plans to see her grandson in [place] this weekend. She plans to go to the beach for 1-2 weeks in May, 2018. She prefers to be reached by her home phone and then her daughter-in-law.  Socially she lives by herself since her husband's passing in 12/2016. Her house is 1 level. She has support from her son and his wife. She has good family support. She has a stationary bike he rides 30 minutes a day and keeps track of her steps per day. She is very active and goes out often. 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   In the past the patient had a complete hysterectomy in early 1981 for bleeding. During her surgery  she had cancer cells found and had a BSO. She had no following treatments. She had a pelvic rebuilt in 1990s due to prolapse. She had a yearly pap smear. She is diagnosed os type II DM but is not on any medication at this time.  She takes tramadol for the pain as needed, trazodone to help her sleep and takes XANAX for her anxiety. She has been on this for years. She has been on Trazodone for 40 years to also help her anxiety. She takes Lipitor for her high cholesterol, not  regularly. She takes HCTZ for her fluid retention which she takes as needed. She takes Lisinopril for her HTN. She takes Synthroid for her hypothyroidism.  Her maternal grandmother had breast cancer. Her maternal cousin had breast cancer and her paternal cousin had a blood disorder. She smoked for 10-15 years of 1/2-1 pack a day before stopping in 1980s.   On review of symptoms, pt notes left abdominal ache which she tried tylenol and she was recently given Tramadol. This helped her some. She denies abdominal bloating, dark urine or jaundice and denies dysuria.  CURRENT THERAPY: chemo FOLFIRINOX every 2 weeks starting 07/19/17   INTERVAL HISTORY:    Carly [name] 77 y.o. presents to the office today accompanied by her daughter-in-law. She notes she tolerated chemo except for diarrhea. She had episodes 3 times a day at least. She was taking imodium 4 pills a day.    On review of symptoms, pt notes diarrhea. She notes sometimes when she urinated she "quivers" at the end of stream during the day. This has been going on for the last few days. She notes her appetite has been fair. She notes both eyes were blurry 3 days ago.    MEDICAL HISTORY:  Past Medical History:  Diagnosis Date  . Allergic rhinitis    Skin Test 04-14-2009  . Asthma   . Cancer ([place]) 1981   adenocarcinoma of uterus  . Diabetes mellitus without complication ([place])    type II, no meds per pt  . Hyperlipemia   . Hypertension   . Insomnia     SURGICAL HISTORY: Past Surgical History:  Procedure Laterality Date  . ABDOMINAL HYSTERECTOMY    . CARPAL TUNNEL RELEASE    . EUS N/A 06/13/2017   Procedure: FULL UPPER ENDOSCOPIC ULTRASOUND (EUS) RADIAL;  Surgeon: Arta Silence, MD;  Location: [facility];  Service: Endoscopy;  Laterality: N/A;  . I&D EXTREMITY  09/17/2011   Procedure: IRRIGATION AND DEBRIDEMENT EXTREMITY;  Surgeon: Roseanne Kaufman, MD;  Location: [facility];  Service: Orthopedics;  Laterality: Left;  with drain  placement  . IR FLUORO GUIDE PORT INSERTION RIGHT  07/18/2017  . IR US GUIDE VASC ACCESS RIGHT  07/18/2017  . NASAL SEPTOPLASTY W/ TURBINOPLASTY    . pelvic floor rebuild    . TONSILLECTOMY    . TOTAL ABDOMINAL HYSTERECTOMY W/ BILATERAL SALPINGOOPHORECTOMY      SOCIAL HISTORY: Social History   Socioeconomic History  . Marital status: Widowed    Spouse name: Not on file  . Number of children: Not on file  . Years of education: Not on file  . Highest education level: Not on file  Occupational History  . Not on file  Social Needs  . Financial resource strain: Not on file  . Food insecurity:    Worry: Not on file    Inability: Not on file  . Transportation needs:    Medical: Not on file    Non-medical:  Not on file  Tobacco Use  . Smoking status: Former Smoker    Packs/day: 0.50    Years: 15.00    Pack years: 7.50    Types: Cigarettes    Last attempt to quit: 02/27/1978    Years since quitting: 39.4  . Smokeless tobacco: Never Used  Substance and Sexual Activity  . Alcohol use: Yes    Alcohol/week: 0.0 oz    Comment: occ- glass of wine  . Drug use: No  . Sexual activity: Yes    Comment: 1st intercouse- 15, partners- 1  Lifestyle  . Physical activity:    Days per week: Not on file    Minutes per session: Not on file  . Stress: Not on file  Relationships  . Social connections:    Talks on phone: Not on file    Gets together: Not on file    Attends religious service: Not on file    Active member of club or organization: Not on file    Attends meetings of clubs or organizations: Not on file    Relationship status: Not on file  . Intimate partner violence:    Fear of current or ex partner: Not on file    Emotionally abused: Not on file    Physically abused: Not on file    Forced sexual activity: Not on file  Other Topics Concern  . Not on file  Social History Narrative  . Not on file    FAMILY HISTORY: Family History  Problem Relation Age of Onset  . Stroke  Mother   . Heart attack Father   . Breast cancer Maternal Grandmother   . Breast cancer Cousin   . Cancer Cousin        oral cancer     ALLERGIES:  is allergic to cephalexin and nabumetone.  MEDICATIONS:  Current Outpatient Medications  Medication Sig Dispense Refill  . ALPRAZolam (XANAX) 0.5 MG tablet Take 1 tablet (0.5 mg total) by mouth at bedtime as needed for anxiety. 90 tablet 0  . Ascorbic Acid (VITAMIN C) 500 MG PACK Take 100 mg by mouth daily.    Marland Kitchen atorvastatin (LIPITOR) 10 MG tablet Take 10 mg by mouth at bedtime.     Marland Kitchen CALCIUM PO Take 1 tablet by mouth daily.     . Cholecalciferol (VITAMIN D) 2000 units CAPS Take by mouth.    . Cyanocobalamin (VITAMIN B-12 PO) Take 1 tablet by mouth daily.     . fluconazole (DIFLUCAN) 150 MG tablet Take one tablet now and repeat in 72 hours 2 tablet 0  . Glucosamine-Chondroit-Vit C-Mn (GLUCOSAMINE 1500 COMPLEX PO) Take 1 capsule by mouth daily.      . hydrochlorothiazide (HYDRODIURIL) 25 MG tablet Take 1 tablet by mouth daily as needed.     Marland Kitchen HYDROcodone-acetaminophen (NORCO) 5-325 MG tablet Take 1 tablet by mouth every 6 (six) hours as needed for moderate pain. 30 tablet 0  . hyoscyamine (LEVBID) 0.375 MG 12 hr tablet Take 0.375 mg by mouth every 12 (twelve) hours as needed for cramping.     . latanoprost (XALATAN) 0.005 % ophthalmic solution 1 drop at bedtime.    Marland Kitchen levothyroxine (SYNTHROID, LEVOTHROID) 25 MCG tablet Take 25 mcg by mouth daily before breakfast.     . lidocaine-prilocaine (EMLA) cream Apply to affected area once 30 g 3  . lisinopril (PRINIVIL,ZESTRIL) 10 MG tablet Take 10 mg by mouth at bedtime.    . Omega-3 Fatty Acids (FISH OIL PO) Take 1  capsule by mouth daily.     Marland Kitchen omeprazole (PRILOSEC) 20 MG capsule Take 1 tablet by mouth daily.    . ondansetron (ZOFRAN) 8 MG tablet Take 1 tablet (8 mg total) by mouth 2 (two) times daily as needed for refractory nausea / vomiting. Start on day 3 after chemotherapy. 30 tablet 1  .  ONETOUCH DELICA LANCETS 44R MISC USE TO TEST YOUR BLOOD SUGAR ONCE A DAY FASTING AND 2 HRS AFTER A MEAL AS NEEDED  3  . ONETOUCH VERIO test strip USE TO TEST YOUR BLOOD SUGAR ONCE A DAY FASTING & 2 HRS AFTER A MEAL AS NEEDED  3  . prochlorperazine (COMPAZINE) 10 MG tablet Take 1 tablet (10 mg total) by mouth every 6 (six) hours as needed (NAUSEA). 30 tablet 1  . traMADol (ULTRAM) 50 MG tablet Take 1-2 tablets every 6 hours as needed for pain 60 tablet 0  . traZODone (DESYREL) 100 MG tablet Take 1 tablet (100 mg total) by mouth at bedtime. (Patient taking differently: Take 50 mg by mouth at bedtime. ) 90 tablet 3  . diphenoxylate-atropine (LOMOTIL) 2.5-0.025 MG tablet Take 1-2 tablets by mouth 4 (four) times daily as needed for diarrhea or loose stools. 30 tablet 1   No current facility-administered medications for this visit.     REVIEW OF SYSTEMS:   Constitutional: Denies fevers, chills or abnormal night sweats Eyes: Denies double vision or watery eyes (+) blurred vision Ears, nose, mouth, throat, and face: Denies mucositis or sore throat Respiratory: Denies cough, dyspnea or wheezes Cardiovascular: Denies palpitation, chest discomfort or lower extremity swelling Gastrointestinal:  Denies nausea, heartburn or change in bowel habits (+) significant diarrhea, at least 3 times a day with some incontinence.  Urinary: (+) Frequent urination at night, "quivering" during daytime urination  Skin: Denies abnormal skin rashes Lymphatics: Denies new lymphadenopathy or easy bruising Neurological:Denies numbness, tingling or new weaknesses Behavioral/Psych: Mood is stable, no new changes All other systems were reviewed with the patient and are negative.  PHYSICAL EXAMINATION: ECOG PERFORMANCE STATUS: 1 - Symptomatic but completely ambulatory  Vitals:   07/26/17 0909  BP: 137/76  Pulse: 100  Resp: 18  Temp: 98.2 F (36.8 C)  SpO2: 100%   Filed Weights   07/26/17 0909  Weight: 143 lb 11.2 oz  (65.2 kg)    GENERAL:alert, no distress and comfortable SKIN: skin color, texture, turgor are normal, no rashes or significant lesions EYES: normal, conjunctiva are pink and non-injected, sclera clear OROPHARYNX:no exudate, no erythema and lips, buccal mucosa, and tongue normal  NECK: supple, thyroid normal size, non-tender, without nodularity LYMPH:  no palpable lymphadenopathy in the cervical, axillary or inguinal LUNGS: clear to auscultation and percussion with normal breathing effort HEART: regular rhythm and no murmurs and no lower extremity edema (+) increased heart rate ABDOMEN:abdomen soft, non-tender and normal bowel sounds Musculoskeletal:no cyanosis of digits and no clubbing  PSYCH: alert & oriented x 3 with fluent speech NEURO: no focal motor/sensory deficits  LABORATORY DATA:  I have reviewed the data as listed CBC Latest Ref Rng & Units 07/26/2017 07/19/2017 07/18/2017  WBC 3.9 - 10.3 K/uL 3.4(L) 5.7 5.8  Hemoglobin 11.6 - 15.9 g/dL 11.8 13.1 13.3  Hematocrit 34.8 - 46.6 % 33.8(L) 37.9 37.5  Platelets 145 - 400 K/uL 107(L) 164 156    CMP Latest Ref Rng & Units 07/26/2017 07/19/2017 06/29/2017  Glucose 70 - 140 mg/dL 239(H) 142(H) 225(H)  BUN 7 - 26 mg/dL '15 16 15  ' Creatinine  0.60 - 1.10 mg/dL 0.81 0.75 0.87  Sodium 136 - 145 mmol/L 135(L) 137 136  Potassium 3.5 - 5.1 mmol/L 4.0 4.0 4.6  Chloride 98 - 109 mmol/L 102 104 102  CO2 22 - 29 mmol/L '23 26 28  ' Calcium 8.4 - 10.4 mg/dL 8.9 9.5 9.9  Total Protein 6.4 - 8.3 g/dL 6.0(L) 6.5 7.1  Total Bilirubin 0.2 - 1.2 mg/dL 0.6 0.8 0.4  Alkaline Phos 40 - 150 U/L 83 76 87  AST 5 - 34 U/L '17 18 15  ' ALT 0 - 55 U/L '17 20 11    ' PATHOLOGY   Diagnosis 06/13/17 FINE NEEDLE ASPIRATION, ENDOSCOPIC, PANCREAS HEAD (SPECIMEN 1 OF 1 COLLECTED 06/13/17): ADENOCARCINOMA. Preliminary Diagnosis Intraoperative Diagnosis: 1-3) ATYPICAL CELLS, ADDITIONAL MATERIAL REQUESTED. (NDK) 4-6 ADENOCARCINOMA. (NDK) Reported to Dr. Paulita Fujita on 06/13/17 @  11:07am.    PROCEDURES  EUS by Dr. Paulita Fujita 06/13/17  IMPRESSION:  -There was no sign of significant pathology in the ampulla. - There was no evidence of significant pathology in the left lobe of the liver. - A mass was identified in the pancreatic head. [place] SMV; invasion portal confluence; no adenopathy. Fine needle aspiration performed. If this is a pancreatic adenocarcinoma, it would be staged T4/N0/Mx by EUS.   RADIOGRAPHIC STUDIES: I have personally reviewed the radiological images as listed and agreed with the findings in the report. Ir US Guide Vasc Access Right  Result Date: 07/18/2017 INDICATION: 77 year old female with pancreatic cancer. She presents for port catheter placement for durable IV access prior to chemotherapy. EXAM: IMPLANTED PORT A CATH PLACEMENT WITH ULTRASOUND AND FLUOROSCOPIC GUIDANCE MEDICATIONS: Clindamycin 900 mg IV; The antibiotic was administered within an appropriate time interval prior to skin puncture. ANESTHESIA/SEDATION: Versed 2 mg IV; Fentanyl 100 mcg IV; Moderate Sedation Time:  21 minutes The patient was continuously monitored during the procedure by the interventional radiology nurse under my direct supervision. FLUOROSCOPY TIME:  0 minutes, 42 seconds (4 mGy) COMPLICATIONS: None immediate. PROCEDURE: The right neck and chest was prepped with chlorhexidine, and draped in the usual sterile fashion using maximum barrier technique (cap and mask, sterile gown, sterile gloves, large sterile sheet, hand hygiene and cutaneous antiseptic). Antibiotic prophylaxis was provided with g Ancef administered IV one hour prior to skin incision. Local anesthesia was attained by infiltration with 1% lidocaine with epinephrine. Ultrasound demonstrated patency of the right internal jugular vein, and this was documented with an image. Under real-time ultrasound guidance, this vein was accessed with a 21 gauge micropuncture needle and image documentation was performed. A small  dermatotomy was made at the access site with an 11 scalpel. A 0.018" wire was advanced into the SVC and the access needle exchanged for a 4F micropuncture vascular sheath. The 0.018" wire was then removed and a 0.035" wire advanced into the IVC. An appropriate location for the subcutaneous reservoir was selected below the clavicle and an incision was made through the skin and underlying soft tissues. The subcutaneous tissues were then dissected using a combination of blunt and sharp surgical technique and a pocket was formed. A lumen power injectable portacatheter was then tunneled through the subcutaneous tissues from the pocket to the dermatotomy and the port reservoir placed within the subcutaneous pocket. The venous access site was then serially dilated and a peel away vascular sheath placed over the wire. The wire was removed and the port catheter advanced into position under fluoroscopic guidance. The catheter tip is positioned in the upper right atrium. This was documented with a  spot image. The portacatheter was then tested and found to flush and aspirate well. The port was flushed with saline followed by 100 units/mL heparinized saline. The pocket was then closed in two layers using first subdermal inverted interrupted absorbable sutures followed by a running subcuticular suture. The epidermis was then sealed with Dermabond. The dermatotomy at the venous access site was also sealed with Dermabond. IMPRESSION: Successful placement of a right IJ approach Power Port with ultrasound and fluoroscopic guidance. The catheter is ready for use. Signed, Criselda Peaches, MD [facility] Radiology Electronically Signed   By: Jacqulynn Cadet M.D.   On: 07/18/2017 10:21   Ir Fluoro Guide Port Insertion Right  Result Date: 07/18/2017 INDICATION: 77 year old female with pancreatic cancer. She presents for port catheter placement for durable IV access prior to  chemotherapy. EXAM: IMPLANTED PORT A CATH PLACEMENT WITH ULTRASOUND AND FLUOROSCOPIC GUIDANCE MEDICATIONS: Clindamycin 900 mg IV; The antibiotic was administered within an appropriate time interval prior to skin puncture. ANESTHESIA/SEDATION: Versed 2 mg IV; Fentanyl 100 mcg IV; Moderate Sedation Time:  21 minutes The patient was continuously monitored during the procedure by the interventional radiology nurse under my direct supervision. FLUOROSCOPY TIME:  0 minutes, 42 seconds (4 mGy) COMPLICATIONS: None immediate. PROCEDURE: The right neck and chest was prepped with chlorhexidine, and draped in the usual sterile fashion using maximum barrier technique (cap and mask, sterile gown, sterile gloves, large sterile sheet, hand hygiene and cutaneous antiseptic). Antibiotic prophylaxis was provided with g Ancef administered IV one hour prior to skin incision. Local anesthesia was attained by infiltration with 1% lidocaine with epinephrine. Ultrasound demonstrated patency of the right internal jugular vein, and this was documented with an image. Under real-time ultrasound guidance, this vein was accessed with a 21 gauge micropuncture needle and image documentation was performed. A small dermatotomy was made at the access site with an 11 scalpel. A 0.018" wire was advanced into the SVC and the access needle exchanged for a 102F micropuncture vascular sheath. The 0.018" wire was then removed and a 0.035" wire advanced into the IVC. An appropriate location for the subcutaneous reservoir was selected below the clavicle and an incision was made through the skin and underlying soft tissues. The subcutaneous tissues were then dissected using a combination of blunt and sharp surgical technique and a pocket was formed. A lumen power injectable portacatheter was then tunneled through the subcutaneous tissues from the pocket to the dermatotomy and the port reservoir placed within the subcutaneous pocket. The venous access site was  then serially dilated and a peel away vascular sheath placed over the wire. The wire was removed and the port catheter advanced into position under fluoroscopic guidance. The catheter tip is positioned in the upper right atrium. This was documented with a spot image. The portacatheter was then tested and found to flush and aspirate well. The port was flushed with saline followed by 100 units/mL heparinized saline. The pocket was then closed in two layers using first subdermal inverted interrupted absorbable sutures followed by a running subcuticular suture. The epidermis was then sealed with Dermabond. The dermatotomy at the venous access site was also sealed with Dermabond. IMPRESSION: Successful placement of a right IJ approach Power Port with ultrasound and fluoroscopic guidance. The catheter is ready for use. Signed, Criselda Peaches, MD [facility] Radiology Electronically Signed   By: Jacqulynn Cadet M.D.   On: 07/18/2017 10:21    ASSESSMENT & PLAN:  Carly [name] is a 77 y.o. Caucasian female with a history of hypothyroidism, GERD, Anxiety, osteopenia, Glaucoma, HTN, hypercholesterolemia, and H/o Uterine Cancer, presented with intermittent upper abdominal pain    1. 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  -She has locally advanced Adenocarcinoma of Pancreatic Head.  The EUS showed a 3.0 x 3.0 cm mass in the pancreatic head, with sonographic evidence suggesting invasion into the superior mesenteric artery, the portal vein,, the SMV, and splenic vein.  Unfortunately this is likely unresectable tumor, due to the invasion of SMA. -Staging scan was negative for metastasis. -Her case was discussed in our GI tumor board.  Images were reviewed.  Our surgeon Dr. Barry Dienes concurs that her pancreatic cancer is not  resectable. -We reviewed the natural history of pancreatic cancer, and discussed treatment options including chemotherapy and radiation, and the goal of therapy is palliative to prolong his life and improve his quality of life. -Given her overall good baseline health and PS, I recommend her to consider chemo. I think she is a candidate for intensive chemotherapy such as FOLFIRINOX every 2 weeks, or gemcitabine and Abraxane weekly for 2 weeks on and 1 week off. If she is not willing to go through intensive chemo, the single agent gemcitabine or palliative care alone would be options.   -After lengthy discussion with patient and her family members, she agrees to try chemotherapy FOLFIRINOX  -She had her PAC placement on 07/18/17. She attended the chemo class  -I reviewed her antiemetics use with compazine and zofran. I explained her cold sensitivity may occur for the first week at most. She should ovoid the cold for that first week.Other potential side effects from chemotherapy were also reviewed with her, she voiced good understanding.  -I previously recommended she not drink coffee or high volume of liquid at night as she has been having frequent urination at night. Will monitor.  -I previously discussed it is best that she remove her nail acrylic and not frequently dye her hair during chemo. I discussed her nail color will change along with her palms and her hair will thin with chemotherapy.  -She started FOLFIRINOX on 07/19/17. She tolerated moderately with moderate diarrhea. I advised her to start with 2 tabs of imodium with her first bout of diarrhea and take more as needed. I called in lomotil today for her to use if imodium is not enough.  -Given her "quivering" during urination, I will do a Urine culture to rule out infection. She is agreeable.  -She appears to have allergic reaction to adhesive on her chest, this is recovering. I recommend benadryl cream to help.  -Labs reviewed, mild pancytopenia  from chemo. I recommend she watch for bleeding, signs of infection and to avoid large crowds. Her glucose is 239 today. I advised her to reduce her sugar intake. I discussed her elevated sugar can effect her vision.  -Will continue to monitor her Ca19-9 for chemo response.  -I plan to scan her after cycle 5 chemo.  -F/u in 1 week for cycle 2    2. 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 She should reduce sugary fruits and high carb meets and  increase vegetable.  -She should continue to exercise as tolerable and check her Blood glucose at home in the morning and during the day.    3. Diarrhea, Abdominal pain and weight loss -She has previously met with our dietician [place].  I encouraged her to take a nutritional supplement -She has been using tramadol as needed for pain. I previously prescribed hydrocodone for her, in case her pain gets worse during the trip. -I reviewed her pain medication use. She can use Tramadol for moderate pain and hydrocodone for worse pain.  -She had mild weight loss after diarrhea, I encouraged her to increase calorie and protein intake and to work to gain weight.  -She will increase her Imodium and I prescribed Lomotil on 07/26/17 to take if imodium is not enough.    4. H/o Uterine Cancer, Stage I, diagnosed in 1981 -She had a total hysterectomy and BSO in 1981. She had no following treatments -Followed by her Gynecologist with yearly pap smears.   5. Genetic Testing  -Given her history of uterine cancer and family history of breast cancer she is eligible for genetic testing.  -She is interested. I previously sent genetic counseling referral. She has not gone yet.   6. Social Support -She lives alone but has good familial support.  -I previously  recommended that she has someone check on her weekly especially when on chemo treatment.  -I previously sent a SW referral and she has previously met with them  7.Goal of care discussion  -We again discussed the incurable nature of her cancer, and the overall poor prognosis, especially if she does not have good response to chemotherapy or progress on chemo -The patient understands the goal of care is palliative. -she is full code now   8. HTN, Hypothyroidism  -She is on HCTZ as needed and Lisinopril daily  -She is on Levothyroxine daily -I discussed with Chemo her BP may becomes low. I recommended if she develops this or dizziness I suggest she hold HCTZ first and continue to monitor her BP at home.  -Continue to Follow up with PCP for management and medication.   PLAN:  -I prescribed Lomotil today  -Lab, flush, f/u and FOLFIRINOX in 1 week  -Lab for UA and culture today  -Continue monitoring her blood glucose, she may need metformin if remains to be high (>200)    No orders of the defined types were placed in this encounter.   All questions were answered. The patient knows to call the clinic with any problems, questions or concerns. I spent 20 minutes counseling the patient face to face. The total time spent in the appointment was 25 minutes and more than 50% was on counseling.     Truitt Merle, MD 07/26/2017   I, Joslyn Devon, am acting as scribe for Truitt Merle, MD.   I have reviewed the above documentation for accuracy and completeness, and I agree with the above.

## 2017-07-26 ENCOUNTER — Inpatient Hospital Stay (HOSPITAL_BASED_OUTPATIENT_CLINIC_OR_DEPARTMENT_OTHER): Payer: Medicare Other | Admitting: Hematology

## 2017-07-26 ENCOUNTER — Inpatient Hospital Stay: Payer: Medicare Other

## 2017-07-26 ENCOUNTER — Encounter: Payer: Self-pay | Admitting: Hematology

## 2017-07-26 ENCOUNTER — Telehealth: Payer: Self-pay | Admitting: Hematology

## 2017-07-26 VITALS — BP 137/76 | HR 100 | Temp 98.2°F | Resp 18 | Ht 62.5 in | Wt 143.7 lb

## 2017-07-26 DIAGNOSIS — D61810 Antineoplastic chemotherapy induced pancytopenia: Secondary | ICD-10-CM

## 2017-07-26 DIAGNOSIS — I10 Essential (primary) hypertension: Secondary | ICD-10-CM

## 2017-07-26 DIAGNOSIS — R300 Dysuria: Secondary | ICD-10-CM

## 2017-07-26 DIAGNOSIS — M8580 Other specified disorders of bone density and structure, unspecified site: Secondary | ICD-10-CM | POA: Diagnosis not present

## 2017-07-26 DIAGNOSIS — R197 Diarrhea, unspecified: Secondary | ICD-10-CM | POA: Diagnosis not present

## 2017-07-26 DIAGNOSIS — Z8542 Personal history of malignant neoplasm of other parts of uterus: Secondary | ICD-10-CM | POA: Diagnosis not present

## 2017-07-26 DIAGNOSIS — H409 Unspecified glaucoma: Secondary | ICD-10-CM | POA: Diagnosis not present

## 2017-07-26 DIAGNOSIS — E7800 Pure hypercholesterolemia, unspecified: Secondary | ICD-10-CM

## 2017-07-26 DIAGNOSIS — C250 Malignant neoplasm of head of pancreas: Secondary | ICD-10-CM

## 2017-07-26 DIAGNOSIS — Z5111 Encounter for antineoplastic chemotherapy: Secondary | ICD-10-CM | POA: Diagnosis not present

## 2017-07-26 DIAGNOSIS — E039 Hypothyroidism, unspecified: Secondary | ICD-10-CM | POA: Diagnosis not present

## 2017-07-26 LAB — CMP (CANCER CENTER ONLY)
ALK PHOS: 83 U/L (ref 40–150)
ALT: 17 U/L (ref 0–55)
AST: 17 U/L (ref 5–34)
Albumin: 3.2 g/dL — ABNORMAL LOW (ref 3.5–5.0)
Anion gap: 10 (ref 3–11)
BILIRUBIN TOTAL: 0.6 mg/dL (ref 0.2–1.2)
BUN: 15 mg/dL (ref 7–26)
CALCIUM: 8.9 mg/dL (ref 8.4–10.4)
CO2: 23 mmol/L (ref 22–29)
CREATININE: 0.81 mg/dL (ref 0.60–1.10)
Chloride: 102 mmol/L (ref 98–109)
GFR, Est AFR Am: >60 mL/min (ref >60)
GFR, Estimated: >60 mL/min (ref >60)
Glucose, Bld: 239 mg/dL — ABNORMAL HIGH (ref 70–140)
Potassium: 4 mmol/L (ref 3.5–5.1)
Sodium: 135 mmol/L — ABNORMAL LOW (ref 136–145)
Total Protein: 6 g/dL — ABNORMAL LOW (ref 6.4–8.3)

## 2017-07-26 LAB — CBC WITH DIFFERENTIAL (CANCER CENTER ONLY)
BASOS PCT: 0 %
Basophils Absolute: 0 10*3/uL (ref 0.0–0.1)
EOS ABS: 0.1 10*3/uL (ref 0.0–0.5)
EOS PCT: 3 %
HCT: 33.8 % — ABNORMAL LOW (ref 34.8–46.6)
HEMOGLOBIN: 11.8 g/dL (ref 11.6–15.9)
LYMPHS ABS: 0.7 10*3/uL — AB (ref 0.9–3.3)
Lymphocytes Relative: 21 %
MCH: 31.6 pg (ref 25.1–34.0)
MCHC: 34.9 g/dL (ref 31.5–36.0)
MCV: 90.7 fL (ref 79.5–101.0)
MONOS PCT: 14 %
Monocytes Absolute: 0.5 10*3/uL (ref 0.1–0.9)
Neutro Abs: 2.1 10*3/uL (ref 1.5–6.5)
Neutrophils Relative %: 62 %
PLATELETS: 107 10*3/uL — AB (ref 145–400)
RBC: 3.73 MIL/uL (ref 3.70–5.45)
RDW: 12.9 % (ref 11.2–14.5)
WBC: 3.4 10*3/uL — AB (ref 3.9–10.3)

## 2017-07-26 MED ORDER — DIPHENOXYLATE-ATROPINE 2.5-0.025 MG PO TABS: 1.0000 | ORAL_TABLET | Freq: Four times a day (QID) | ORAL | 1 refills | Status: DC | PRN

## 2017-07-26 NOTE — Telephone Encounter (Signed)
No 5/30 los.

## 2017-07-26 NOTE — Addendum Note (Signed)
Addended by: Truitt Merle on: 07/26/2017 05:17 PM   Modules accepted: Orders

## 2017-07-27 ENCOUNTER — Telehealth: Payer: Self-pay

## 2017-07-27 NOTE — Telephone Encounter (Signed)
Patient denies dysuria this morning, states she doesn't think she needs her urine tested.

## 2017-07-27 NOTE — Telephone Encounter (Signed)
-----   Message from Truitt Merle, MD sent at 07/26/2017  5:15 PM EDT ----- I wanted urine culture today for her, but forgot to order and she did not go to lab. Could you call her to see if she wants to come back and do it, if she still has dysuria?  Thanks  Krista Blue

## 2017-08-01 ENCOUNTER — Other Ambulatory Visit: Payer: Self-pay | Admitting: Nurse Practitioner

## 2017-08-01 DIAGNOSIS — C250 Malignant neoplasm of head of pancreas: Secondary | ICD-10-CM

## 2017-08-02 ENCOUNTER — Inpatient Hospital Stay: Payer: Medicare Other

## 2017-08-02 ENCOUNTER — Inpatient Hospital Stay (HOSPITAL_BASED_OUTPATIENT_CLINIC_OR_DEPARTMENT_OTHER): Payer: Medicare Other | Admitting: Nurse Practitioner

## 2017-08-02 ENCOUNTER — Inpatient Hospital Stay: Payer: Medicare Other | Attending: Hematology

## 2017-08-02 ENCOUNTER — Other Ambulatory Visit: Payer: Self-pay | Admitting: Hematology

## 2017-08-02 ENCOUNTER — Other Ambulatory Visit: Payer: Self-pay | Admitting: Emergency Medicine

## 2017-08-02 ENCOUNTER — Encounter: Payer: Self-pay | Admitting: Nurse Practitioner

## 2017-08-02 VITALS — BP 151/80 | HR 80 | Temp 98.0°F | Resp 18 | Ht 62.5 in | Wt 147.0 lb

## 2017-08-02 DIAGNOSIS — N3000 Acute cystitis without hematuria: Secondary | ICD-10-CM

## 2017-08-02 DIAGNOSIS — M8580 Other specified disorders of bone density and structure, unspecified site: Secondary | ICD-10-CM

## 2017-08-02 DIAGNOSIS — D6959 Other secondary thrombocytopenia: Secondary | ICD-10-CM

## 2017-08-02 DIAGNOSIS — R300 Dysuria: Secondary | ICD-10-CM

## 2017-08-02 DIAGNOSIS — E039 Hypothyroidism, unspecified: Secondary | ICD-10-CM | POA: Diagnosis not present

## 2017-08-02 DIAGNOSIS — I10 Essential (primary) hypertension: Secondary | ICD-10-CM | POA: Insufficient documentation

## 2017-08-02 DIAGNOSIS — R739 Hyperglycemia, unspecified: Secondary | ICD-10-CM

## 2017-08-02 DIAGNOSIS — R197 Diarrhea, unspecified: Secondary | ICD-10-CM | POA: Insufficient documentation

## 2017-08-02 DIAGNOSIS — E119 Type 2 diabetes mellitus without complications: Secondary | ICD-10-CM | POA: Diagnosis not present

## 2017-08-02 DIAGNOSIS — Z5111 Encounter for antineoplastic chemotherapy: Secondary | ICD-10-CM | POA: Insufficient documentation

## 2017-08-02 DIAGNOSIS — Z452 Encounter for adjustment and management of vascular access device: Secondary | ICD-10-CM | POA: Diagnosis not present

## 2017-08-02 DIAGNOSIS — C250 Malignant neoplasm of head of pancreas: Secondary | ICD-10-CM | POA: Diagnosis not present

## 2017-08-02 DIAGNOSIS — Z7189 Other specified counseling: Secondary | ICD-10-CM

## 2017-08-02 DIAGNOSIS — Z8542 Personal history of malignant neoplasm of other parts of uterus: Secondary | ICD-10-CM | POA: Insufficient documentation

## 2017-08-02 DIAGNOSIS — R1010 Upper abdominal pain, unspecified: Secondary | ICD-10-CM | POA: Diagnosis not present

## 2017-08-02 LAB — CBC WITH DIFFERENTIAL (CANCER CENTER ONLY)
BASOS ABS: 0 10*3/uL (ref 0.0–0.1)
BASOS PCT: 0 %
EOS ABS: 0.2 10*3/uL (ref 0.0–0.5)
Eosinophils Relative: 2 %
HEMATOCRIT: 31.8 % — AB (ref 34.8–46.6)
HEMOGLOBIN: 11 g/dL — AB (ref 11.6–15.9)
Lymphocytes Relative: 18 %
Lymphs Abs: 1.6 10*3/uL (ref 0.9–3.3)
MCH: 31.4 pg (ref 25.1–34.0)
MCHC: 34.6 g/dL (ref 31.5–36.0)
MCV: 90.9 fL (ref 79.5–101.0)
Monocytes Absolute: 0.6 10*3/uL (ref 0.1–0.9)
Monocytes Relative: 7 %
NEUTROS ABS: 6.4 10*3/uL (ref 1.5–6.5)
NEUTROS PCT: 73 %
Platelet Count: 123 10*3/uL — ABNORMAL LOW (ref 145–400)
RBC: 3.5 MIL/uL — ABNORMAL LOW (ref 3.70–5.45)
RDW: 13.5 % (ref 11.2–14.5)
WBC Count: 8.8 10*3/uL (ref 3.9–10.3)

## 2017-08-02 LAB — URINALYSIS, COMPLETE (UACMP) WITH MICROSCOPIC
Bilirubin Urine: NEGATIVE
GLUCOSE, UA: NEGATIVE mg/dL
HGB URINE DIPSTICK: NEGATIVE
Ketones, ur: NEGATIVE mg/dL
NITRITE: POSITIVE — AB
Protein, ur: NEGATIVE mg/dL
SPECIFIC GRAVITY, URINE: 1.005 (ref 1.005–1.030)
pH: 7 (ref 5.0–8.0)

## 2017-08-02 LAB — CMP (CANCER CENTER ONLY)
ALK PHOS: 93 U/L (ref 40–150)
ALT: 12 U/L (ref 0–55)
ANION GAP: 6 (ref 3–11)
AST: 19 U/L (ref 5–34)
Albumin: 3.1 g/dL — ABNORMAL LOW (ref 3.5–5.0)
BUN: 10 mg/dL (ref 7–26)
CALCIUM: 9.1 mg/dL (ref 8.4–10.4)
CO2: 28 mmol/L (ref 22–29)
Chloride: 104 mmol/L (ref 98–109)
Creatinine: 0.74 mg/dL (ref 0.60–1.10)
Glucose, Bld: 165 mg/dL — ABNORMAL HIGH (ref 70–140)
Potassium: 3.7 mmol/L (ref 3.5–5.1)
Sodium: 138 mmol/L (ref 136–145)
TOTAL PROTEIN: 5.9 g/dL — AB (ref 6.4–8.3)
Total Bilirubin: 0.4 mg/dL (ref 0.2–1.2)

## 2017-08-02 MED ORDER — DEXAMETHASONE SODIUM PHOSPHATE 10 MG/ML IJ SOLN
10.0000 mg | Freq: Once | INTRAMUSCULAR | Status: AC
  Administered 2017-08-02: 10 mg via INTRAVENOUS

## 2017-08-02 MED ORDER — PALONOSETRON HCL INJECTION 0.25 MG/5ML
0.2500 mg | Freq: Once | INTRAVENOUS | Status: AC
  Administered 2017-08-02: 0.25 mg via INTRAVENOUS

## 2017-08-02 MED ORDER — MAGIC MOUTHWASH W/LIDOCAINE: 5.0000 mL | Freq: Three times a day (TID) | ORAL | 0 refills | Status: DC | PRN

## 2017-08-02 MED ORDER — NITROFURANTOIN MONOHYD MACRO 100 MG PO CAPS: 100.0000 mg | ORAL_CAPSULE | Freq: Two times a day (BID) | ORAL | 0 refills | Status: DC

## 2017-08-02 MED ORDER — DEXTROSE 5 % IV SOLN
Freq: Once | INTRAVENOUS | Status: AC
Start: 2017-08-02 — End: 2017-08-02
  Administered 2017-08-02: 13:00:00 via INTRAVENOUS

## 2017-08-02 MED ORDER — ATROPINE SULFATE 1 MG/ML IJ SOLN
0.5000 mg | Freq: Once | INTRAMUSCULAR | Status: AC | PRN
  Administered 2017-08-02: 0.5 mg via INTRAVENOUS

## 2017-08-02 MED ORDER — TRAMADOL HCL 50 MG PO TABS: ORAL_TABLET | ORAL | 0 refills | Status: DC

## 2017-08-02 MED ORDER — PALONOSETRON HCL INJECTION 0.25 MG/5ML
INTRAVENOUS | Status: AC
  Filled 2017-08-02: qty 5

## 2017-08-02 MED ORDER — OXALIPLATIN CHEMO INJECTION 100 MG/20ML
87.0000 mg/m2 | Freq: Once | INTRAVENOUS | Status: AC
  Administered 2017-08-02: 150 mg via INTRAVENOUS
  Filled 2017-08-02: qty 20

## 2017-08-02 MED ORDER — ATROPINE SULFATE 1 MG/ML IJ SOLN
INTRAMUSCULAR | Status: AC
Start: 2017-08-02 — End: ?
  Filled 2017-08-02: qty 1

## 2017-08-02 MED ORDER — IRINOTECAN HCL CHEMO INJECTION 100 MG/5ML
150.0000 mg/m2 | Freq: Once | INTRAVENOUS | Status: AC
  Administered 2017-08-02: 260 mg via INTRAVENOUS
  Filled 2017-08-02: qty 13

## 2017-08-02 MED ORDER — DEXAMETHASONE SODIUM PHOSPHATE 10 MG/ML IJ SOLN
INTRAMUSCULAR | Status: AC
  Filled 2017-08-02: qty 1

## 2017-08-02 MED ORDER — SODIUM CHLORIDE 0.9 % IV SOLN
2400.0000 mg/m2 | INTRAVENOUS | Status: DC
  Administered 2017-08-02: 4100 mg via INTRAVENOUS
  Filled 2017-08-02: qty 82

## 2017-08-02 MED ORDER — LEUCOVORIN CALCIUM INJECTION 350 MG
400.0000 mg/m2 | Freq: Once | INTRAVENOUS | Status: AC
  Administered 2017-08-02: 680 mg via INTRAVENOUS
  Filled 2017-08-02: qty 34

## 2017-08-02 NOTE — Progress Notes (Signed)
Confirmed dose of Oxaliplatin increased today w/ Cira Rue, NP [facility], Pharm.D., CPP 08/02/2017@1 :00 PM

## 2017-08-02 NOTE — Patient Instructions (Signed)
[facility] Discharge Instructions for Patients Receiving Chemotherapy  Today you received the following chemotherapy agents: Oxaliplatin, Irinotecan, Leucovorin, 5FU   To help prevent nausea and vomiting after your treatment, we encourage you to take your nausea medication as directed.    If you develop nausea and vomiting that is not controlled by your nausea medication, call the clinic.   BELOW ARE SYMPTOMS THAT SHOULD BE REPORTED IMMEDIATELY:  *FEVER GREATER THAN 100.5 F  *CHILLS WITH OR WITHOUT FEVER  NAUSEA AND VOMITING THAT IS NOT CONTROLLED WITH YOUR NAUSEA MEDICATION  *UNUSUAL SHORTNESS OF BREATH  *UNUSUAL BRUISING OR BLEEDING  TENDERNESS IN MOUTH AND THROAT WITH OR WITHOUT PRESENCE OF ULCERS  *URINARY PROBLEMS  *BOWEL PROBLEMS  UNUSUAL RASH Items with * indicate a potential emergency and should be followed up as soon as possible.  Feel free to call the clinic should you have any questions or concerns. The clinic phone number is [phone].  Please show the CHEMO ALERT CARD at check-in to the Emergency Department and triage nurse.   

## 2017-08-02 NOTE — Progress Notes (Signed)
[facility]  Telephone:([phone] [phone]) [phone]  Clinic Follow up Note   Patient Care Team: Darcus Austin, MD as PCP - General (Family Medicine) Arta Silence, MD as Consulting Physician (Gastroenterology) Truitt Merle, MD as Consulting Physician (Hematology) 08/02/2017  SUMMARY OF ONCOLOGIC HISTORY: Oncology History   Cancer Staging Pancreatic cancer [facility]) Staging form: Exocrine Pancreas, AJCC 8th Edition - Clinical stage from 06/13/2017: Stage III (cT4, cN0, cM0) - Signed by Truitt Merle, MD on 06/13/2017       Pancreatic cancer ([place])   06/13/2017 Initial Diagnosis    Pancreatic cancer ([place])      06/13/2017 Cancer Staging    Staging form: Exocrine Pancreas, AJCC 8th Edition - Clinical stage from 06/13/2017: Stage III (cT4, cN0, cM0) - Signed by Truitt Merle, MD on 06/13/2017      06/13/2017 Procedure    EUS by Dr. Paulita Fujita 06/13/17  IMPRESSION:  -There was no sign of significant pathology in the ampulla. - There was no evidence of significant pathology in the left lobe of the liver. - A mass was identified in the pancreatic head. [place] SMV; invasion portal confluence; no adenopathy. Fine needle aspiration performed. If this is a pancreatic adenocarcinoma, it would be staged T4/N0/Mx by EUS.      06/13/2017 Initial Biopsy    Diagnosis 06/13/17 FINE NEEDLE ASPIRATION, ENDOSCOPIC, PANCREAS HEAD (SPECIMEN 1 OF 1 COLLECTED 06/13/17): ADENOCARCINOMA. Preliminary Diagnosis Intraoperative Diagnosis: 1-3) ATYPICAL CELLS, ADDITIONAL MATERIAL REQUESTED. (NDK) 4-6 ADENOCARCINOMA. (NDK) Reported to Dr. Paulita Fujita on 06/13/17 @ 11:07am.       06/21/2017 Imaging    CT Chest without contrast IMPRESSION: 1. No findings suspicious for metastatic disease within the chest.  2. Tiny subpleural right lower lobe pulmonary nodule is nonspecific, but likely benign. This can be addressed on follow-up imaging.  3. Aortic Atherosclerosis (ICD10-I70.0).      07/19/2017 -  Chemotherapy      chemo FOLFIRINOX every 2 weeks starting 07/19/17      CURRENT THERAPY: chemo FOLFIRINOX every 2 weeks starting 07/19/17   INTERVAL HISTORY: Ms. Gidley returns for follow up and cycle 2 FOLFIRINOX as scheduled. She tolerated neulasta on day 3 without bone pain or side effects, she was "zapped" with fatigue on day 4 after attending 2 church services but otherwise reports good energy level. She developed 2 mouth sores on Sunday 1 week after treatment, worse when dentures are applied. She applied ora-gel which helped. Mucositis "slowed her eating" without much change in po intake. Has good appetite. Had nausea one time after overeating. She previously took milk of magnesia daily and miralax PRN for chronic constipation; she developed diarrhea x8 episodes from days 8-9. She took 4 imodium and began lomotil. She was told to take zofran for diarrhea and eat bland diet. Diarrhea eventually stopped. Denies cold sensitivity or neuropathy. Has intermittent mild LLQ pain, stretching and movement helps. Takes tramadol occasionally. Has intermittent dysuria, no hematuria. Urine was dark this morning.   REVIEW OF SYSTEMS:   Constitutional: Denies fevers, chills or abnormal weight loss (+) fatigue x1 day on day 4 (+) low appetite during diarrhea (+) "slowed eating" r/t mucositis but did not limit po intake  Eyes: Denies blurriness of vision Ears, nose, mouth, throat, and face: Denies sore throat (+) mucositis to lower oral mucosa x1 week after chemo Respiratory: Denies cough, dyspnea or wheezes Cardiovascular: Denies palpitation, chest discomfort or lower extremity swelling Gastrointestinal:  Denies vomiting, heartburn or change in bowel habits (+) nausea x1 after overeating (+)  diarrhea from days 8-9, 8 episodes total (+) intermittent LLQ pain, managed with 2 tramadol PRN GU: Denies hematuria (+) intermittent dysuria Skin: Denies abnormal skin rashes Lymphatics: Denies new lymphadenopathy or easy  bruising Neurological:Denies numbness, tingling or new weaknesses Behavioral/Psych: Mood is stable, no new changes  All other systems were reviewed with the patient and are negative.  MEDICAL HISTORY:  Past Medical History:  Diagnosis Date  . Allergic rhinitis    Skin Test 04-14-2009  . Asthma   . Cancer ([place]) 1981   adenocarcinoma of uterus  . 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 Hyperlipemia   . Hypertension   . Insomnia     SURGICAL HISTORY: Past Surgical History:  Procedure Laterality Date  . ABDOMINAL HYSTERECTOMY    . CARPAL TUNNEL RELEASE    . EUS N/A 06/13/2017   Procedure: FULL UPPER ENDOSCOPIC ULTRASOUND (EUS) RADIAL;  Surgeon: Arta Silence, MD;  Location: [facility];  Service: Endoscopy;  Laterality: N/A;  . I&D EXTREMITY  09/17/2011   Procedure: IRRIGATION AND DEBRIDEMENT EXTREMITY;  Surgeon: Roseanne Kaufman, MD;  Location: [facility];  Service: Orthopedics;  Laterality: Left;  with drain placement  . IR FLUORO GUIDE PORT INSERTION RIGHT  07/18/2017  . IR US GUIDE VASC ACCESS RIGHT  07/18/2017  . NASAL SEPTOPLASTY W/ TURBINOPLASTY    . pelvic floor rebuild    . TONSILLECTOMY    . TOTAL ABDOMINAL HYSTERECTOMY W/ BILATERAL SALPINGOOPHORECTOMY      I have reviewed the social history and family history with the patient and they are unchanged from previous note.  ALLERGIES:  is allergic to cephalexin and nabumetone.  MEDICATIONS:  Current Outpatient Medications  Medication Sig Dispense Refill  . ALPRAZolam (XANAX) 0.5 MG tablet Take 1 tablet (0.5 mg total) by mouth at bedtime as needed for anxiety. 90 tablet 0  . atorvastatin (LIPITOR) 10 MG tablet Take 10 mg by mouth at bedtime.     . Cholecalciferol (VITAMIN D) 2000 units CAPS Take by mouth.    . Glucosamine-Chondroit-Vit C-Mn (GLUCOSAMINE 1500 COMPLEX PO) Take 1 capsule by mouth daily.      Marland Kitchen latanoprost (XALATAN) 0.005 % ophthalmic solution 1 drop at bedtime.    Marland Kitchen  levothyroxine (SYNTHROID, LEVOTHROID) 25 MCG tablet Take 25 mcg by mouth daily before breakfast.     . lidocaine-prilocaine (EMLA) cream Apply to affected area once 30 g 3  . lisinopril (PRINIVIL,ZESTRIL) 10 MG tablet Take 10 mg by mouth at bedtime.    Marland Kitchen omeprazole (PRILOSEC) 20 MG capsule Take 1 tablet by mouth daily.    . ondansetron (ZOFRAN) 8 MG tablet Take 1 tablet (8 mg total) by mouth 2 (two) times daily as needed for refractory nausea / vomiting. Start on day 3 after chemotherapy. 30 tablet 1  . ONETOUCH DELICA LANCETS 01X MISC USE TO TEST YOUR BLOOD SUGAR ONCE A DAY FASTING AND 2 HRS AFTER A MEAL AS NEEDED  3  . ONETOUCH VERIO test strip USE TO TEST YOUR BLOOD SUGAR ONCE A DAY FASTING & 2 HRS AFTER A MEAL AS NEEDED  3  . prochlorperazine (COMPAZINE) 10 MG tablet Take 1 tablet (10 mg total) by mouth every 6 (six) hours as needed (NAUSEA). 30 tablet 1  . traMADol (ULTRAM) 50 MG tablet Take 1-2 tablets every 6 hours as needed for pain 60 tablet 0  . traZODone (DESYREL) 100 MG tablet Take 1 tablet (100 mg total) by mouth at bedtime. (  Patient taking differently: Take 50 mg by mouth at bedtime. ) 90 tablet 3  . Ascorbic Acid (VITAMIN C) 500 MG PACK Take 100 mg by mouth daily.    Marland Kitchen CALCIUM PO Take 1 tablet by mouth daily.     . Cyanocobalamin (VITAMIN B-12 PO) Take 1 tablet by mouth daily.     . diphenoxylate-atropine (LOMOTIL) 2.5-0.025 MG tablet Take 1-2 tablets by mouth 4 (four) times daily as needed for diarrhea or loose stools. (Patient not taking: Reported on 08/02/2017) 30 tablet 1  . hydrochlorothiazide (HYDRODIURIL) 25 MG tablet Take 1 tablet by mouth daily as needed.     Marland Kitchen HYDROcodone-acetaminophen (NORCO) 5-325 MG tablet Take 1 tablet by mouth every 6 (six) hours as needed for moderate pain. (Patient not taking: Reported on 08/02/2017) 30 tablet 0  . hyoscyamine (LEVBID) 0.375 MG 12 hr tablet Take 0.375 mg by mouth every 12 (twelve) hours as needed for cramping.     . Omega-3 Fatty Acids  (FISH OIL PO) Take 1 capsule by mouth daily.      No current facility-administered medications for this visit.    Facility-Administered Medications Ordered in Other Visits  Medication Dose Route Frequency Provider Last Rate Last Dose  . atropine injection 0.5 mg  0.5 mg Intravenous Once PRN Truitt Merle, MD      . fluorouracil (ADRUCIL) 4,100 mg in sodium chloride 0.9 % 68 mL chemo infusion  2,400 mg/m2 (Treatment Plan Recorded) Intravenous 1 day or 1 dose Truitt Merle, MD      . irinotecan (CAMPTOSAR) 260 mg in dextrose 5 % 500 mL chemo infusion  150 mg/m2 (Treatment Plan Recorded) Intravenous Once Truitt Merle, MD      . leucovorin 680 mg in dextrose 5 % 250 mL infusion  400 mg/m2 (Treatment Plan Recorded) Intravenous Once Truitt Merle, MD      . oxaliplatin (ELOXATIN) 150 mg in dextrose 5 % 500 mL chemo infusion  87 mg/m2 (Treatment Plan Recorded) Intravenous Once Truitt Merle, MD 265 mL/hr at 08/02/17 1402 150 mg at 08/02/17 1402    PHYSICAL EXAMINATION: ECOG PERFORMANCE STATUS: 1 - Symptomatic but completely ambulatory  Vitals:   08/02/17 1139  BP: (!) 151/80  Pulse: 80  Resp: 18  Temp: 98 F (36.7 C)  SpO2: 99%   Filed Weights   08/02/17 1139  Weight: 147 lb (66.7 kg)    GENERAL:alert, no distress and comfortable SKIN: no rashes or significant lesions EYES: normal, Conjunctiva are pink and non-injected, sclera clear OROPHARYNX:no thrush, ulcers, or erythema    LYMPH:  no palpable cervical or supraclavicular lymphadenopathy LUNGS: clear to auscultation with normal breathing effort HEART: regular rate & rhythm and no murmurs and no lower extremity edema ABDOMEN:abdomen soft, non-tender and normal bowel sounds Musculoskeletal:no cyanosis of digits and no clubbing  NEURO: alert & oriented x 3 with fluent speech, no focal motor/sensory deficits PAC without erythema   LABORATORY DATA:  I have reviewed the data as listed CBC Latest Ref Rng & Units 08/02/2017 07/26/2017 07/19/2017  WBC 3.9 -  10.3 K/uL 8.8 3.4(L) 5.7  Hemoglobin 11.6 - 15.9 g/dL 11.0(L) 11.8 13.1  Hematocrit 34.8 - 46.6 % 31.8(L) 33.8(L) 37.9  Platelets 145 - 400 K/uL 123(L) 107(L) 164     CMP Latest Ref Rng & Units 08/02/2017 07/26/2017 07/19/2017  Glucose 70 - 140 mg/dL 165(H) 239(H) 142(H)  BUN 7 - 26 mg/dL 10 15 16   Creatinine 0.60 - 1.10 mg/dL 0.74 0.81 0.75  Sodium 136 -  145 mmol/L 138 135(L) 137  Potassium 3.5 - 5.1 mmol/L 3.7 4.0 4.0  Chloride 98 - 109 mmol/L 104 102 104  CO2 22 - 29 mmol/L 28 23 26   Calcium 8.4 - 10.4 mg/dL 9.1 8.9 9.5  Total Protein 6.4 - 8.3 g/dL 5.9(L) 6.0(L) 6.5  Total Bilirubin 0.2 - 1.2 mg/dL 0.4 0.6 0.8  Alkaline Phos 40 - 150 U/L 93 83 76  AST 5 - 34 U/L 19 17 18   ALT 0 - 55 U/L 12 17 20    PATHOLOGY   Diagnosis 06/13/17 FINE NEEDLE ASPIRATION, ENDOSCOPIC, PANCREAS HEAD (SPECIMEN 1 OF 1 COLLECTED 06/13/17): ADENOCARCINOMA. Preliminary Diagnosis Intraoperative Diagnosis: 1-3) ATYPICAL CELLS, ADDITIONAL MATERIAL REQUESTED. (NDK) 4-6 ADENOCARCINOMA. (NDK) Reported to Dr. Paulita Fujita on 06/13/17 @ 11:07am.     RADIOGRAPHIC STUDIES: I have personally reviewed the radiological images as listed and agreed with the findings in the report. No results found.   ASSESSMENT & PLAN: SUHANA WILNER is a 77 y.o. Caucasian female with a history of hypothyroidism, GERD, Anxiety, osteopenia, Glaucoma, HTN, hypercholesterolemia, and H/o Uterine Cancer, presented with intermittent upper abdominal pain    1. 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 Diarrhea, abdominal pain, and weight loss 4. H/o uterine cancer, stage I, diagnosed 1981 s/p total hysterectomy and BSO in 1981, no adjuvant treatment; followed by GYN 5. Genetic testing  6. Social support  7. Goals of care discussion  8. HTN, hypothyroidism   Ms. Alverson appears stable. She completed cycle 1 FOLFIRINOX with dose-reduced  oxaliplatin on 07/19/17. She tolerated treatment well overall. She had 24 hours of diarrhea 1 week after chemotherapy, after using milk of magnesia daily and miralax PRN for chronic constipation. I recommend she hold laxatives and only use if no BM in 2 days. I reviewed lomotil and imodium dosing for diarrhea. She had mild mucositis that is did not limit po intake. Weight is stable. I reviewed warm salt water rinse, oragel, and will prescribe magic mouthwash. She knows to call [facility] if she develops further mucositis that affects po intake. I recommend to proceed today with cycle 2 FOLFIRINOX with oxaliplatin at full dose. She does not require emend at this time. For intermittent abdominal pain I refilled tramadol.   Labs reviewed, she has mild thrombocytopenia secondary to chemotherapy, PLT 123K, and hyperglycemia, likely secondary to steroids and pancreatic cancer. We reviewed well rounded diet and low sugar intake. Urinalysis is positive for pyuria and nitrite positive bacteria; will treat empirically with macrobid and await c/s. OK to proceed with chemotherapy. F/u in 2 weeks with cycle 3. Plan to restage after cycle 5.   PLAN: -Labs reviewed, proceed with cycle 2 FOLFIRINOX, oxaliplatin increased to 85 mg/m2; continue q2 weeks  -neulasta on day 3 -Hold laxatives unless no BM in 2 days; reviewed symptom management for diarrhea, mucositis -Rx: macrobid 100 mg BID x5 days for acute cystitis, c/s pending; magic mouthwash for mucositis  -F/u in 2 weeks with cycle 3 -Refilled tramadol  All questions were answered. The patient knows to call the clinic with any problems, questions or concerns. No barriers to learning was detected. I spent 20 minutes counseling the patient face to face. The total time spent in the appointment was 20 minutes and more than 50% was on counseling and review of test results     Alla Feeling, NP 08/02/17

## 2017-08-04 ENCOUNTER — Inpatient Hospital Stay: Payer: Medicare Other

## 2017-08-04 VITALS — BP 150/83 | HR 93 | Temp 98.2°F | Resp 17

## 2017-08-04 DIAGNOSIS — Z8542 Personal history of malignant neoplasm of other parts of uterus: Secondary | ICD-10-CM | POA: Diagnosis not present

## 2017-08-04 DIAGNOSIS — I10 Essential (primary) hypertension: Secondary | ICD-10-CM | POA: Diagnosis not present

## 2017-08-04 DIAGNOSIS — Z5111 Encounter for antineoplastic chemotherapy: Secondary | ICD-10-CM | POA: Diagnosis not present

## 2017-08-04 DIAGNOSIS — C250 Malignant neoplasm of head of pancreas: Secondary | ICD-10-CM | POA: Diagnosis not present

## 2017-08-04 DIAGNOSIS — Z452 Encounter for adjustment and management of vascular access device: Secondary | ICD-10-CM | POA: Diagnosis not present

## 2017-08-04 DIAGNOSIS — E039 Hypothyroidism, unspecified: Secondary | ICD-10-CM | POA: Diagnosis not present

## 2017-08-04 DIAGNOSIS — Z7189 Other specified counseling: Secondary | ICD-10-CM

## 2017-08-04 LAB — URINE CULTURE

## 2017-08-04 MED ORDER — HEPARIN SOD (PORK) LOCK FLUSH 100 UNIT/ML IV SOLN
500.0000 [IU] | Freq: Once | INTRAVENOUS | Status: AC | PRN
  Administered 2017-08-04: 500 [IU]
  Filled 2017-08-04: qty 5

## 2017-08-04 MED ORDER — SODIUM CHLORIDE 0.9% FLUSH
10.0000 mL | INTRAVENOUS | Status: DC | PRN
  Administered 2017-08-04: 10 mL
  Filled 2017-08-04: qty 10

## 2017-08-04 MED ORDER — PEGFILGRASTIM INJECTION 6 MG/0.6ML ~~LOC~~
6.0000 mg | PREFILLED_SYRINGE | Freq: Once | SUBCUTANEOUS | Status: AC
Start: 2017-08-04 — End: 2017-08-04
  Administered 2017-08-04: 6 mg via SUBCUTANEOUS

## 2017-08-04 MED ORDER — PEGFILGRASTIM INJECTION 6 MG/0.6ML ~~LOC~~
PREFILLED_SYRINGE | SUBCUTANEOUS | Status: AC
  Filled 2017-08-04: qty 0.6

## 2017-08-04 NOTE — Patient Instructions (Signed)
Pegfilgrastim injection What is this medicine? PEGFILGRASTIM (PEG fil gra stim) is a long-acting granulocyte colony-stimulating factor that stimulates the growth of neutrophils, a type of white blood cell important in the body's fight against infection. It is used to reduce the incidence of fever and infection in patients with certain types of cancer who are receiving chemotherapy that affects the bone marrow, and to increase survival after being exposed to high doses of radiation. This medicine may be used for other purposes; ask your health care provider or pharmacist if you have questions. COMMON BRAND NAME(S): Neulasta What should I tell my health care provider before I take this medicine? They need to know if you have any of these conditions: -kidney disease -latex allergy -ongoing radiation therapy -sickle cell disease -skin reactions to acrylic adhesives (On-Body Injector only) -an unusual or allergic reaction to pegfilgrastim, filgrastim, other medicines, foods, dyes, or preservatives -pregnant or trying to get pregnant -breast-feeding How should I use this medicine? This medicine is for injection under the skin. If you get this medicine at home, you will be taught how to prepare and give the pre-filled syringe or how to use the On-body Injector. Refer to the patient Instructions for Use for detailed instructions. Use exactly as directed. Tell your healthcare provider immediately if you suspect that the On-body Injector may not have performed as intended or if you suspect the use of the On-body Injector resulted in a missed or partial dose. It is important that you put your used needles and syringes in a special sharps container. Do not put them in a trash can. If you do not have a sharps container, call your pharmacist or healthcare provider to get one. Talk to your pediatrician regarding the use of this medicine in children. While this drug may be prescribed for selected conditions,  precautions do apply. Overdosage: If you think you have taken too much of this medicine contact a poison control center or emergency room at once. NOTE: This medicine is only for you. Do not share this medicine with others. What if I miss a dose? It is important not to miss your dose. Call your doctor or health care professional if you miss your dose. If you miss a dose due to an On-body Injector failure or leakage, a new dose should be administered as soon as possible using a single prefilled syringe for manual use. What may interact with this medicine? Interactions have not been studied. Give your health care provider a list of all the medicines, herbs, non-prescription drugs, or dietary supplements you use. Also tell them if you smoke, drink alcohol, or use illegal drugs. Some items may interact with your medicine. This list may not describe all possible interactions. Give your health care provider a list of all the medicines, herbs, non-prescription drugs, or dietary supplements you use. Also tell them if you smoke, drink alcohol, or use illegal drugs. Some items may interact with your medicine. What should I watch for while using this medicine? You may need blood work done while you are taking this medicine. If you are going to need a MRI, CT scan, or other procedure, tell your doctor that you are using this medicine (On-Body Injector only). What side effects may I notice from receiving this medicine? Side effects that you should report to your doctor or health care professional as soon as possible: -allergic reactions like skin rash, itching or hives, swelling of the face, lips, or tongue -dizziness -fever -pain, redness, or irritation at site   where injected -pinpoint red spots on the skin -red or dark-brown urine -shortness of breath or breathing problems -stomach or side pain, or pain at the shoulder -swelling -tiredness -trouble passing urine or change in the amount of urine Side  effects that usually do not require medical attention (report to your doctor or health care professional if they continue or are bothersome): -bone pain -muscle pain This list may not describe all possible side effects. Call your doctor for medical advice about side effects. You may report side effects to FDA at 1-800-FDA-1088. Where should I keep my medicine? Keep out of the reach of children. Store pre-filled syringes in a refrigerator between 2 and 8 degrees C (36 and 46 degrees F). Do not freeze. Keep in carton to protect from light. Throw away this medicine if it is left out of the refrigerator for more than 48 hours. Throw away any unused medicine after the expiration date. NOTE: This sheet is a summary. It may not cover all possible information. If you have questions about this medicine, talk to your doctor, pharmacist, or health care provider.  2018 [employer] (2016-02-10 12:58:03)  

## 2017-08-05 ENCOUNTER — Other Ambulatory Visit: Payer: Self-pay | Admitting: Hematology

## 2017-08-08 ENCOUNTER — Encounter: Payer: Self-pay | Admitting: Genetic Counselor

## 2017-08-08 ENCOUNTER — Inpatient Hospital Stay (HOSPITAL_BASED_OUTPATIENT_CLINIC_OR_DEPARTMENT_OTHER): Payer: Medicare Other | Admitting: Genetic Counselor

## 2017-08-08 ENCOUNTER — Inpatient Hospital Stay: Payer: Medicare Other

## 2017-08-08 DIAGNOSIS — Z7183 Encounter for nonprocreative genetic counseling: Secondary | ICD-10-CM

## 2017-08-08 DIAGNOSIS — C250 Malignant neoplasm of head of pancreas: Secondary | ICD-10-CM

## 2017-08-08 DIAGNOSIS — Z8542 Personal history of malignant neoplasm of other parts of uterus: Secondary | ICD-10-CM | POA: Diagnosis not present

## 2017-08-08 DIAGNOSIS — Z803 Family history of malignant neoplasm of breast: Secondary | ICD-10-CM | POA: Diagnosis not present

## 2017-08-08 NOTE — Progress Notes (Signed)
REFERRING PROVIDER: Truitt Merle, MD [place], [place] [address]  PRIMARY PROVIDER:  Darcus Austin, MD  PRIMARY REASON FOR VISIT:  1. History of uterine cancer   2. Family history of breast cancer   3. Malignant neoplasm of head of pancreas ([place])      HISTORY OF PRESENT ILLNESS:   Carly [name], a 77 y.o. female, was seen for a [place] cancer genetics consultation at the request of Dr. Burr Medico due to a personal and family history of cancer.  Carly [name] presents to clinic today to discuss the possibility of a hereditary predisposition to cancer, genetic testing, and to further clarify her future cancer risks, as well as potential cancer risks for family members.   In 62, at the age of 29, Carly [name] was diagnosed with cancer cells of the uterus. This was treated with a hysterectomy and BSO.  In 2019, at the age of 34, Carly [name] was diagnosed with pancreatic cancer.      CANCER HISTORY:  Oncology History   Cancer Staging Pancreatic cancer [facility]) Staging form: Exocrine Pancreas, AJCC 8th Edition - Clinical stage from 06/13/2017: Stage III (cT4, cN0, cM0) - Signed by Truitt Merle, MD on 06/13/2017       Pancreatic cancer ([place])   06/13/2017 Initial Diagnosis    Pancreatic cancer ([place])      06/13/2017 Cancer Staging    Staging form: Exocrine Pancreas, AJCC 8th Edition - Clinical stage from 06/13/2017: Stage III (cT4, cN0, cM0) - Signed by Truitt Merle, MD on 06/13/2017      06/13/2017 Procedure    EUS by Dr. Paulita Fujita 06/13/17  IMPRESSION:  -There was no sign of significant pathology in the ampulla. - There was no evidence of significant pathology in the left lobe of the liver. - A mass was identified in the pancreatic head. [place] SMV; invasion portal confluence; no adenopathy. Fine needle aspiration performed. If this is a pancreatic adenocarcinoma, it would be staged T4/N0/Mx by EUS.      06/13/2017 Initial Biopsy    Diagnosis 06/13/17 FINE NEEDLE  ASPIRATION, ENDOSCOPIC, PANCREAS HEAD (SPECIMEN 1 OF 1 COLLECTED 06/13/17): ADENOCARCINOMA. Preliminary Diagnosis Intraoperative Diagnosis: 1-3) ATYPICAL CELLS, ADDITIONAL MATERIAL REQUESTED. (NDK) 4-6 ADENOCARCINOMA. (NDK) Reported to Dr. Paulita Fujita on 06/13/17 @ 11:07am.       06/21/2017 Imaging    CT Chest without contrast IMPRESSION: 1. No findings suspicious for metastatic disease within the chest.  2. Tiny subpleural right lower lobe pulmonary nodule is nonspecific, but likely benign. This can be addressed on follow-up imaging.  3. Aortic Atherosclerosis (ICD10-I70.0).      07/19/2017 -  Chemotherapy    chemo FOLFIRINOX every 2 weeks starting 07/19/17         HORMONAL RISK FACTORS:  Menarche was at age 37.  First live birth at age 29.  OCP use for approximately <5 years.  Ovaries intact: no.  Hysterectomy: yes.  Menopausal status: postmenopausal.  HRT use: 35 years. Colonoscopy: yes; normal. Mammogram within the last year: yes. Number of breast biopsies: 1. Up to date with pelvic exams:  yes. Any excessive radiation exposure in the past:  no  Past Medical History:  Diagnosis Date  . Allergic rhinitis    Skin Test 04-14-2009  . Asthma   . Cancer ([place]) 1981   adenocarcinoma of uterus  . Diabetes mellitus without complication ([place])    type II, no meds per pt  . Family history of breast cancer   . History of uterine cancer   .  Hyperlipemia   . Hypertension   . Insomnia     Past Surgical History:  Procedure Laterality Date  . ABDOMINAL HYSTERECTOMY    . CARPAL TUNNEL RELEASE    . EUS N/A 06/13/2017   Procedure: FULL UPPER ENDOSCOPIC ULTRASOUND (EUS) RADIAL;  Surgeon: Arta Silence, MD;  Location: [facility];  Service: Endoscopy;  Laterality: N/A;  . I&D EXTREMITY  09/17/2011   Procedure: IRRIGATION AND DEBRIDEMENT EXTREMITY;  Surgeon: Roseanne Kaufman, MD;  Location: [facility];  Service: Orthopedics;  Laterality: Left;  with drain placement  . IR FLUORO GUIDE PORT  INSERTION RIGHT  07/18/2017  . IR US GUIDE VASC ACCESS RIGHT  07/18/2017  . NASAL SEPTOPLASTY W/ TURBINOPLASTY    . pelvic floor rebuild    . TONSILLECTOMY    . TOTAL ABDOMINAL HYSTERECTOMY W/ BILATERAL SALPINGOOPHORECTOMY      Social History   Socioeconomic History  . Marital status: Widowed    Spouse name: Not on file  . Number of children: Not on file  . Years of education: Not on file  . Highest education level: Not on file  Occupational History  . Not on file  Social Needs  . Financial resource strain: Not on file  . Food insecurity:    Worry: Not on file    Inability: Not on file  . Transportation needs:    Medical: Not on file    Non-medical: Not on file  Tobacco Use  . Smoking status: Former Smoker    Packs/day: 0.50    Years: 15.00    Pack years: 7.50    Types: Cigarettes    Last attempt to quit: 02/27/1978    Years since quitting: 39.4  . Smokeless tobacco: Never Used  Substance and Sexual Activity  . Alcohol use: Yes    Alcohol/week: 0.0 oz    Comment: occ- glass of wine  . Drug use: No  . Sexual activity: Yes    Comment: 1st intercouse- 15, partners- 1  Lifestyle  . Physical activity:    Days per week: Not on file    Minutes per session: Not on file  . Stress: Not on file  Relationships  . Social connections:    Talks on phone: Not on file    Gets together: Not on file    Attends religious service: Not on file    Active member of club or organization: Not on file    Attends meetings of clubs or organizations: Not on file    Relationship status: Not on file  Other Topics Concern  . Not on file  Social History Narrative  . Not on file     FAMILY HISTORY:  We obtained a detailed, 4-generation family history.  Significant diagnoses are listed below: Family History  Problem Relation Age of Onset  . Stroke Mother 18  . Heart attack Father 64  . Dementia Brother   . Hypertension Brother   . Skin cancer Brother   . Breast cancer Maternal  Grandmother        dx >50  . Breast cancer Cousin        mat first cousin, dx in her 19s  . Cancer Cousin        oral cancer  . Breast cancer Cousin        mat first cousin, dx in her 45s  . Heart disease Maternal Uncle   . Other Maternal Grandfather        suicide  . Heart disease Paternal Grandmother  The patient has two sons who are cancer free.  She has two brothers, one died of heart disease and the other had skin cancer.  Both parents are deceased.  Her mother died of a stroke at 70 and her father died of a heart attack at 70.  The patient's mother had a brother and sister, neither who had cancer.  However, the brother had two daughters with breast cancer in their 70's.  The maternal grandparents are deceased.  The grandfather committed suicide during the depression and the grandmother was diagnosed with breast cancer over age 91.  The patient's father had seven siblings, none who were reported with cancer.  The paternal grandparents are deceased.  The grandmother died of heart disease and the grandfather died from "impaction".  Ms. Milroy is unaware of previous family history of genetic testing for hereditary cancer risks. Patient's maternal ancestors are of Caucasian-NOS descent, and paternal ancestors are of [place] descent. There is no reported Ashkenazi Jewish ancestry. There is no known consanguinity.  GENETIC COUNSELING ASSESSMENT: ALEJAH ARISTIZABAL is a 77 y.o. female with a personal and family history of cancer which is somewhat suggestive of a hereditary cancer syndrome and predisposition to cancer. We, therefore, discussed and recommended the following at today's visit.   DISCUSSION: We discussed that about 5-10% of pancreatic cancer and 3-5% of uterine cancer are due to hereditary causes.  The most common reason why someone would have early onset uterine cancer and a pancreatic cancer diagnosis is due to Lynch syndrome.  The family history has several family members  with breast cancer at older ages.  While this is reassuring based on their ages of onset, genes associated with both breast and pancreatic cancer include ATM, PALB2 and BRCA1 and 2.    We reviewed the characteristics, features and inheritance patterns of hereditary cancer syndromes. We also discussed genetic testing, including the appropriate family members to test, the process of testing, insurance coverage and turn-around-time for results. We discussed the implications of a negative, positive and/or variant of uncertain significant result. We recommended Ms. Negrette pursue genetic testing for the common hereditary gene panel. The Hereditary Gene Panel offered by [facility] includes sequencing and/or deletion duplication testing of the following 47 genes: APC, ATM, AXIN2, BARD1, BMPR1A, BRCA1, BRCA2, BRIP1, CDH1, CDK4, CDKN2A (p14ARF), CDKN2A (p16INK4a), CHEK2, CTNNA1, DICER1, EPCAM (Deletion/duplication testing only), GREM1 (promoter region deletion/duplication testing only), KIT, MEN1, MLH1, MSH2, MSH3, MSH6, MUTYH, NBN, NF1, NHTL1, PALB2, PDGFRA, PMS2, POLD1, POLE, PTEN, RAD50, RAD51C, RAD51D, SDHB, SDHC, SDHD, SMAD4, SMARCA4. STK11, TP53, TSC1, TSC2, and VHL.  The following genes were evaluated for sequence changes only: SDHA and HOXB13 c.251G>A variant only.   Based on Ms. Hoque's personal and family history of cancer, she meets medical criteria for genetic testing. Despite that she meets criteria, she may still have an out of pocket cost. We discussed that if her out of pocket cost for testing is over $100, the laboratory will call and confirm whether she wants to proceed with testing.  If the out of pocket cost of testing is less than $100 she will be billed by the genetic testing laboratory.   PLAN: After considering the risks, benefits, and limitations, Ms. Bouler  provided informed consent to pursue genetic testing and the blood sample was sent to [facility] for analysis of the  Common hereditary cancer panel. Results should be available within approximately 2-3 weeks' time, at which point they will be disclosed by telephone to [name], as will  any additional recommendations warranted by these results. Ms. Tatem will receive a summary of her genetic counseling visit and a copy of her results once available. This information will also be available in Epic. We encouraged Ms. Mian to remain in contact with cancer genetics annually so that we can continuously update the family history and inform her of any changes in cancer genetics and testing that may be of benefit for her family. Ms. Hauth questions were answered to her satisfaction today. Our contact information was provided should additional questions or concerns arise.  Lastly, we encouraged Ms. Tourville to remain in contact with cancer genetics annually so that we can continuously update the family history and inform her of any changes in cancer genetics and testing that may be of benefit for this family.   Ms.  Carriveau questions were answered to her satisfaction today. Our contact information was provided should additional questions or concerns arise. Thank you for the referral and allowing [place] to share in the care of your patient.   Karen P. Florene Glen, [place], [facility] Certified Genetic Counselor [email or web address] phone: [phone]  The patient was seen for a total of 45 minutes in face-to-face genetic counseling.  This patient was discussed with Drs. Magrinat, Lindi Adie and/or Burr Medico who agrees with the above.    _______________________________________________________________________ For Office Staff:  Number of people involved in session: 2 Was an Intern/ student involved with case: no

## 2017-08-15 ENCOUNTER — Encounter: Payer: Self-pay | Admitting: Genetic Counselor

## 2017-08-15 ENCOUNTER — Telehealth: Payer: Self-pay | Admitting: Genetic Counselor

## 2017-08-15 DIAGNOSIS — Z1379 Encounter for other screening for genetic and chromosomal anomalies: Secondary | ICD-10-CM | POA: Insufficient documentation

## 2017-08-15 NOTE — Progress Notes (Signed)
[place]  [phone] [phone]  Clinic Follow Up Note   Patient Care Team: Darcus Austin, MD as PCP - General (Family Medicine) Arta Silence, MD as Consulting Physician (Gastroenterology) Truitt Merle, MD as Consulting Physician (Hematology).  Date of Service:  08/16/2017  CHIEF COMPLAINTS:  F/u for Pancreatic Cancer, stage III   Oncology History   Cancer Staging Pancreatic cancer [facility]) Staging form: Exocrine Pancreas, AJCC 8th Edition - Clinical stage from 06/13/2017: Stage III (cT4, cN0, cM0) - Signed by Truitt Merle, MD on 06/13/2017       Pancreatic cancer ([place])   06/13/2017 Initial Diagnosis    Pancreatic cancer ([place])      06/13/2017 Cancer Staging    Staging form: Exocrine Pancreas, AJCC 8th Edition - Clinical stage from 06/13/2017: Stage III (cT4, cN0, cM0) - Signed by Truitt Merle, MD on 06/13/2017      06/13/2017 Procedure    EUS by Dr. Paulita Fujita 06/13/17  IMPRESSION:  -There was no sign of significant pathology in the ampulla. - There was no evidence of significant pathology in the left lobe of the liver. - A mass was identified in the pancreatic head. Abutment SMV; invasion portal confluence; no adenopathy. Fine needle aspiration performed. If this is a pancreatic adenocarcinoma, it would be staged T4/N0/Mx by EUS.      06/13/2017 Initial Biopsy    Diagnosis 06/13/17 FINE NEEDLE ASPIRATION, ENDOSCOPIC, PANCREAS HEAD (SPECIMEN 1 OF 1 COLLECTED 06/13/17): ADENOCARCINOMA. Preliminary Diagnosis Intraoperative Diagnosis: 1-3) ATYPICAL CELLS, ADDITIONAL MATERIAL REQUESTED. (NDK) 4-6 ADENOCARCINOMA. (NDK) Reported to Dr. Paulita Fujita on 06/13/17 @ 11:07am.       06/21/2017 Imaging    CT Chest without contrast IMPRESSION: 1. No findings suspicious for metastatic disease within the chest.  2. Tiny subpleural right lower lobe pulmonary nodule is nonspecific, but likely benign. This can be addressed on follow-up imaging.  3. Aortic  Atherosclerosis (ICD10-I70.0).      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  The Hereditary Gene Panel offered by [facility] includes sequencing and/or deletion duplication testing of the following 47 genes: APC, ATM, AXIN2, BARD1, BMPR1A, BRCA1, BRCA2, BRIP1, CDH1, CDK4, CDKN2A (p14ARF), CDKN2A (p16INK4a), CHEK2, CTNNA1, DICER1, EPCAM (Deletion/duplication testing only), GREM1 (promoter region deletion/duplication testing only), KIT, MEN1, MLH1, MSH2, MSH3, MSH6, MUTYH, NBN, NF1, NHTL1, PALB2, PDGFRA, PMS2, POLD1, POLE, PTEN, RAD50, RAD51C, RAD51D, SDHB, SDHC, SDHD, SMAD4, SMARCA4. STK11, TP53, TSC1, TSC2, and VHL.  The following genes were evaluated for sequence changes only: SDHA and HOXB13 c.251G>A variant only. The report date is August 14, 2017.        HISTORY OF PRESENTING ILLNESS: 06/14/17 [name] 77 y.o. female is a here because of newly diagnosed Pancreatic Cancer. The patient was referred by Dr. Paulita Fujita. The patient presents to the clinic today accompanied by her 2 sons and her daughter-in-law.   She was having aching of her left abdominal starting 3 months ago after UTI. This pain comes and goes, which worsened at night. She denies issues with her appetite overall but she does not eat as much as she did due to the recent death of her husband. She denies any abdominal bloating. The color of urine has not become dark.   Today the patient notes she still has left abdominal pain. She notes she plans to see her grandson in  [place] this weekend. She plans to go to the beach for 1-2 weeks in May, 2018. She prefers to be reached by her home phone and then her daughter-in-law.  Socially she lives by herself since her husband's passing in 12/2016. Her house is 1 level. She has support from her son and his wife. She has good family support. She has a  stationary bike he rides 30 minutes a day and keeps track of her steps per day. She is very active and goes out often. 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   In the past the patient had a complete hysterectomy in early 1981 for bleeding. During her surgery she had cancer cells found and had a BSO. She had no following treatments. She had a pelvic rebuilt in 1990s due to prolapse. She had a yearly pap smear. She is diagnosed os type II DM but is not on any medication at this time.  She takes tramadol for the pain as needed, trazodone to help her sleep and takes XANAX for her anxiety. She has been on this for years. She has been on Trazodone for 40 years to also help her anxiety. She takes Lipitor for her high cholesterol, not regularly. She takes HCTZ for her fluid retention which she takes as needed. She takes Lisinopril for her HTN. She takes Synthroid for her hypothyroidism.  Her maternal grandmother had breast cancer. Her maternal cousin had breast cancer and her paternal cousin had a blood disorder. She smoked for 10-15 years of 1/2-1 pack a day before stopping in 1980s.   On review of symptoms, pt notes left abdominal ache which she tried tylenol and she was recently given Tramadol. This helped her some. She denies abdominal bloating, dark urine or jaundice and denies dysuria.  CURRENT THERAPY: chemo FOLFIRINOX every 2 weeks started 07/19/17   INTERVAL HISTORY:  Carly [name] is a 77 y.o. female presents to the office today accompanied by her daughter.  She tolerated the second cycle chemotherapy moderately well, coming today for cycle 3.  She complains of diarrhea, nausea, and hair loss. She also complains of myalgia in her LLs, for which her takes Tylenol. She reports cold sensation, she states that it is mild. She can still carry her daily activities normally.  She overall has recovered well from chemo.  No fever. Weight fluctuates, stable  overall.   Xanax used at bedtime for sleep aid.    MEDICAL HISTORY:  Past Medical History:  Diagnosis Date  . Allergic rhinitis    Skin Test 04-14-2009  . Asthma   . Cancer ([place]) 1981   adenocarcinoma of uterus  . Diabetes mellitus without complication ([place])    type II, no meds per pt  . Family history of breast cancer   . History of uterine cancer   . Hyperlipemia   . Hypertension   . Insomnia     SURGICAL HISTORY: Past Surgical History:  Procedure Laterality Date  . ABDOMINAL HYSTERECTOMY    . CARPAL TUNNEL RELEASE    . EUS N/A 06/13/2017   Procedure: FULL UPPER ENDOSCOPIC ULTRASOUND (EUS) RADIAL;  Surgeon: Arta Silence, MD;  Location: [facility];  Service: Endoscopy;  Laterality: N/A;  . I&D EXTREMITY  09/17/2011   Procedure: IRRIGATION AND DEBRIDEMENT EXTREMITY;  Surgeon: Roseanne Kaufman, MD;  Location: [facility];  Service: Orthopedics;  Laterality: Left;  with drain placement  . IR FLUORO GUIDE PORT INSERTION RIGHT  07/18/2017  . IR  US GUIDE VASC ACCESS RIGHT  07/18/2017  . NASAL SEPTOPLASTY W/ TURBINOPLASTY    . pelvic floor rebuild    . TONSILLECTOMY    . TOTAL ABDOMINAL HYSTERECTOMY W/ BILATERAL SALPINGOOPHORECTOMY      SOCIAL HISTORY: Social History   Socioeconomic History  . Marital status: Widowed    Spouse name: Not on file  . Number of children: Not on file  . Years of education: Not on file  . Highest education level: Not on file  Occupational History  . Not on file  Social Needs  . Financial resource strain: Not on file  . Food insecurity:    Worry: Not on file    Inability: Not on file  . Transportation needs:    Medical: Not on file    Non-medical: Not on file  Tobacco Use  . Smoking status: Former Smoker    Packs/day: 0.50    Years: 15.00    Pack years: 7.50    Types: Cigarettes    Last attempt to quit: 02/27/1978    Years since quitting: 39.4  . Smokeless tobacco: Never Used  Substance and Sexual Activity  . Alcohol use: Yes     Alcohol/week: 0.0 oz    Comment: occ- glass of wine  . Drug use: No  . Sexual activity: Yes    Comment: 1st intercouse- 15, partners- 1  Lifestyle  . Physical activity:    Days per week: Not on file    Minutes per session: Not on file  . Stress: Not on file  Relationships  . Social connections:    Talks on phone: Not on file    Gets together: Not on file    Attends religious service: Not on file    Active member of club or organization: Not on file    Attends meetings of clubs or organizations: Not on file    Relationship status: Not on file  . Intimate partner violence:    Fear of current or ex partner: Not on file    Emotionally abused: Not on file    Physically abused: Not on file    Forced sexual activity: Not on file  Other Topics Concern  . Not on file  Social History Narrative  . Not on file    FAMILY HISTORY: Family History  Problem Relation Age of Onset  . Stroke Mother 3  . Heart attack Father 18  . Dementia Brother   . Hypertension Brother   . Skin cancer Brother   . Breast cancer Maternal Grandmother        dx >50  . Breast cancer Cousin        mat first cousin, dx in her 66s  . Cancer Cousin        oral cancer  . Breast cancer Cousin        mat first cousin, dx in her 18s  . Heart disease Maternal Uncle   . Other Maternal Grandfather        suicide  . Heart disease Paternal Grandmother     ALLERGIES:  is allergic to cephalexin and nabumetone.  MEDICATIONS:  Current Outpatient Medications  Medication Sig Dispense Refill  . ALPRAZolam (XANAX) 0.5 MG tablet Take 1 tablet (0.5 mg total) by mouth at bedtime as needed for anxiety. 90 tablet 0  . Ascorbic Acid (VITAMIN C) 500 MG PACK Take 100 mg by mouth daily.    Marland Kitchen atorvastatin (LIPITOR) 10 MG tablet Take 10 mg by mouth at bedtime.     Marland Kitchen  CALCIUM PO Take 1 tablet by mouth daily.     . Cholecalciferol (VITAMIN D) 2000 units CAPS Take by mouth.    . Cyanocobalamin (VITAMIN B-12 PO) Take 1 tablet by  mouth daily.     . diphenoxylate-atropine (LOMOTIL) 2.5-0.025 MG tablet Take 1-2 tablets by mouth 4 (four) times daily as needed for diarrhea or loose stools. 30 tablet 1  . hydrochlorothiazide (HYDRODIURIL) 25 MG tablet Take 1 tablet by mouth daily as needed.     Marland Kitchen HYDROcodone-acetaminophen (NORCO) 5-325 MG tablet Take 1 tablet by mouth every 6 (six) hours as needed for moderate pain. 30 tablet 0  . hyoscyamine (LEVBID) 0.375 MG 12 hr tablet Take 0.375 mg by mouth every 12 (twelve) hours as needed for cramping.     . latanoprost (XALATAN) 0.005 % ophthalmic solution 1 drop at bedtime.    Marland Kitchen levothyroxine (SYNTHROID, LEVOTHROID) 25 MCG tablet Take 25 mcg by mouth daily before breakfast.     . lidocaine-prilocaine (EMLA) cream Apply to affected area once 30 g 3  . lisinopril (PRINIVIL,ZESTRIL) 10 MG tablet Take 10 mg by mouth at bedtime.    . magic mouthwash w/lidocaine SOLN Take 5 mLs by mouth 3 (three) times daily as needed for mouth pain. 240 mL 0  . nitrofurantoin, macrocrystal-monohydrate, (MACROBID) 100 MG capsule Take 1 capsule (100 mg total) by mouth 2 (two) times daily. 10 capsule 0  . Omega-3 Fatty Acids (FISH OIL PO) Take 1 capsule by mouth daily.     Marland Kitchen omeprazole (PRILOSEC) 20 MG capsule Take 1 tablet by mouth daily.    . ondansetron (ZOFRAN) 8 MG tablet Take 1 tablet (8 mg total) by mouth 2 (two) times daily as needed for refractory nausea / vomiting. Start on day 3 after chemotherapy. 30 tablet 1  . ONETOUCH DELICA LANCETS 94T MISC USE TO TEST YOUR BLOOD SUGAR ONCE A DAY FASTING AND 2 HRS AFTER A MEAL AS NEEDED  3  . ONETOUCH VERIO test strip USE TO TEST YOUR BLOOD SUGAR ONCE A DAY FASTING & 2 HRS AFTER A MEAL AS NEEDED  3  . prochlorperazine (COMPAZINE) 10 MG tablet Take 1 tablet (10 mg total) by mouth every 6 (six) hours as needed (NAUSEA). 30 tablet 1  . traMADol (ULTRAM) 50 MG tablet Take 1-2 tablets every 6 hours as needed for pain 60 tablet 0  . traZODone (DESYREL) 100 MG tablet  Take 1 tablet (100 mg total) by mouth at bedtime. (Patient taking differently: Take 50 mg by mouth at bedtime. ) 90 tablet 3   No current facility-administered medications for this visit.     REVIEW OF SYSTEMS: Constitutional: Denies fevers, chills or abnormal night sweats (+) Sleep difficulties, cold sensation Eyes: Denies double vision or watery eyes, blurred vision Ears, nose, mouth, throat, and face: Denies mucositis or sore throat Respiratory: Denies cough, dyspnea or wheezes Cardiovascular: Denies palpitation, chest discomfort or lower extremity swelling Gastrointestinal:  Denies heartburn or change in bowel habits (+) significant diarrhea (+) nausea Urinary: Denies frequency, urgency, burning, or incontinence.  Skin: Denies abnormal skin rashes (+) Hair loss Lymphatics: Denies new lymphadenopathy or easy bruising Neurological:Denies numbness, tingling or new weaknesses MSK: (+) Myalgia in both LLs Behavioral/Psych: Mood is stable, no new changes All other systems were reviewed with the patient and are negative.  PHYSICAL EXAMINATION: ECOG PERFORMANCE STATUS: 1 - Symptomatic but completely ambulatory  Vitals:   08/16/17 0918  BP: (!) 141/81  Pulse: 91  Resp: 19  Temp: 97.9  F (36.6 C)  SpO2: 99%   Filed Weights   08/16/17 0918  Weight: 144 lb 12.8 oz (65.7 kg)    GENERAL:alert, no distress and comfortable SKIN: skin color, texture, turgor are normal, no rashes or significant lesions EYES: normal, conjunctiva are pink and non-injected, sclera clear OROPHARYNX:no exudate, no erythema and lips, buccal mucosa, and tongue normal  NECK: supple, thyroid normal size, non-tender, without nodularity LYMPH:  no palpable lymphadenopathy in the cervical, axillary or inguinal LUNGS: clear to auscultation and percussion with normal breathing effort HEART: regular rhythm and no murmurs and no lower extremity edema (+) increased heart rate ABDOMEN:abdomen soft, non-tender and normal  bowel sounds Musculoskeletal:no cyanosis of digits and no clubbing  PSYCH: alert & oriented x 3 with fluent speech NEURO: no focal motor/sensory deficits  LABORATORY DATA:  I have reviewed the data as listed CBC Latest Ref Rng & Units 08/16/2017 08/02/2017 07/26/2017  WBC 3.9 - 10.3 K/uL 13.7(H) 8.8 3.4(L)  Hemoglobin 11.6 - 15.9 g/dL 11.2(L) 11.0(L) 11.8  Hematocrit 34.8 - 46.6 % 32.1(L) 31.8(L) 33.8(L)  Platelets 145 - 400 K/uL 133(L) 123(L) 107(L)    CMP Latest Ref Rng & Units 08/16/2017 08/02/2017 07/26/2017  Glucose 70 - 140 mg/dL 143(H) 165(H) 239(H)  BUN 7 - 26 mg/dL _0 Creatinine 0.60 - 1.10 mg/dL 0.75 0.74 0.81  Sodium 136 - 145 mmol/L 141 138 135(L)  Potassium 3.5 - 5.1 mmol/L 3.5 3.7 4.0  Chloride 98 - 109 mmol/L 107 104 102  CO2 22 - 29 mmol/L _1 Calcium 8.4 - 10.4 mg/dL 9.2 9.1 8.9  Total Protein 6.4 - 8.3 g/dL 5.9(L) 5.9(L) 6.0(L)  Total Bilirubin 0.2 - 1.2 mg/dL 0.5 0.4 0.6  Alkaline Phos 40 - 150 U/L 119 93 83  AST 5 - 34 U/L _2 ALT 0 - 55 U/L _3 PATHOLOGY   Diagnosis 06/13/17 FINE NEEDLE ASPIRATION, ENDOSCOPIC, PANCREAS HEAD (SPECIMEN 1 OF 1 COLLECTED 06/13/17): ADENOCARCINOMA. Preliminary Diagnosis Intraoperative Diagnosis: 1-3) ATYPICAL CELLS, ADDITIONAL MATERIAL REQUESTED. (NDK) 4-6 ADENOCARCINOMA. (NDK) Reported to Dr. Paulita Fujita on 06/13/17 @ 11:07am.    PROCEDURES  EUS by Dr. Paulita Fujita 06/13/17  IMPRESSION:  -There was no sign of significant pathology in the ampulla. - There was no evidence of significant pathology in the left lobe of the liver. - A mass was identified in the pancreatic head. [place] SMV; invasion portal confluence; no adenopathy. Fine needle aspiration performed. If this is a pancreatic adenocarcinoma, it would be staged T4/N0/Mx by EUS.   RADIOGRAPHIC STUDIES: I have personally reviewed the radiological images as listed and agreed with the findings in the report. Ir US Guide Vasc Access Right  Result Date:  07/18/2017 INDICATION: 77 year old female with pancreatic cancer. She presents for port catheter placement for durable IV access prior to chemotherapy. EXAM: IMPLANTED PORT A CATH PLACEMENT WITH ULTRASOUND AND FLUOROSCOPIC GUIDANCE MEDICATIONS: Clindamycin 900 mg IV; The antibiotic was administered within an appropriate time interval prior to skin puncture. ANESTHESIA/SEDATION: Versed 2 mg IV; Fentanyl 100 mcg IV; Moderate Sedation Time:  21 minutes The patient was continuously monitored during the procedure by the interventional radiology nurse under my direct supervision. FLUOROSCOPY TIME:  0 minutes, 42 seconds (4 mGy) COMPLICATIONS: None immediate. PROCEDURE: The right neck and chest was prepped with chlorhexidine, and draped in the usual sterile fashion using maximum barrier technique (cap and mask, sterile gown, sterile gloves, large sterile sheet, hand hygiene and cutaneous antiseptic). Antibiotic  prophylaxis was provided with g Ancef administered IV one hour prior to skin incision. Local anesthesia was attained by infiltration with 1% lidocaine with epinephrine. Ultrasound demonstrated patency of the right internal jugular vein, and this was documented with an image. Under real-time ultrasound guidance, this vein was accessed with a 21 gauge micropuncture needle and image documentation was performed. A small dermatotomy was made at the access site with an 11 scalpel. A 0.018" wire was advanced into the SVC and the access needle exchanged for a 42F micropuncture vascular sheath. The 0.018" wire was then removed and a 0.035" wire advanced into the IVC. An appropriate location for the subcutaneous reservoir was selected below the clavicle and an incision was made through the skin and underlying soft tissues. The subcutaneous tissues were then dissected using a combination of blunt and sharp surgical technique and a pocket was formed. A lumen power injectable portacatheter was then tunneled through the  subcutaneous tissues from the pocket to the dermatotomy and the port reservoir placed within the subcutaneous pocket. The venous access site was then serially dilated and a peel away vascular sheath placed over the wire. The wire was removed and the port catheter advanced into position under fluoroscopic guidance. The catheter tip is positioned in the upper right atrium. This was documented with a spot image. The portacatheter was then tested and found to flush and aspirate well. The port was flushed with saline followed by 100 units/mL heparinized saline. The pocket was then closed in two layers using first subdermal inverted interrupted absorbable sutures followed by a running subcuticular suture. The epidermis was then sealed with Dermabond. The dermatotomy at the venous access site was also sealed with Dermabond. IMPRESSION: Successful placement of a right IJ approach Power Port with ultrasound and fluoroscopic guidance. The catheter is ready for use. Signed, Criselda [place], MD [facility] Radiology Electronically Signed   By: Jacqulynn Cadet M.D.   On: 07/18/2017 10:21   Ir Fluoro Guide Port Insertion Right  Result Date: 07/18/2017 INDICATION: 77 year old female with pancreatic cancer. She presents for port catheter placement for durable IV access prior to chemotherapy. EXAM: IMPLANTED PORT A CATH PLACEMENT WITH ULTRASOUND AND FLUOROSCOPIC GUIDANCE MEDICATIONS: Clindamycin 900 mg IV; The antibiotic was administered within an appropriate time interval prior to skin puncture. ANESTHESIA/SEDATION: Versed 2 mg IV; Fentanyl 100 mcg IV; Moderate Sedation Time:  21 minutes The patient was continuously monitored during the procedure by the interventional radiology nurse under my direct supervision. FLUOROSCOPY TIME:  0 minutes, 42 seconds (4 mGy) COMPLICATIONS: None immediate. PROCEDURE: The right neck and chest was prepped with chlorhexidine, and draped in  the usual sterile fashion using maximum barrier technique (cap and mask, sterile gown, sterile gloves, large sterile sheet, hand hygiene and cutaneous antiseptic). Antibiotic prophylaxis was provided with g Ancef administered IV one hour prior to skin incision. Local anesthesia was attained by infiltration with 1% lidocaine with epinephrine. Ultrasound demonstrated patency of the right internal jugular vein, and this was documented with an image. Under real-time ultrasound guidance, this vein was accessed with a 21 gauge micropuncture needle and image documentation was performed. A small dermatotomy was made at the access site with an 11 scalpel. A 0.018" wire was advanced into the SVC and the access needle exchanged for a 42F micropuncture vascular sheath. The 0.018" wire was then removed and a 0.035" wire advanced into the IVC. An appropriate location for the subcutaneous reservoir was selected below the clavicle and an  incision was made through the skin and underlying soft tissues. The subcutaneous tissues were then dissected using a combination of blunt and sharp surgical technique and a pocket was formed. A lumen power injectable portacatheter was then tunneled through the subcutaneous tissues from the pocket to the dermatotomy and the port reservoir placed within the subcutaneous pocket. The venous access site was then serially dilated and a peel away vascular sheath placed over the wire. The wire was removed and the port catheter advanced into position under fluoroscopic guidance. The catheter tip is positioned in the upper right atrium. This was documented with a spot image. The portacatheter was then tested and found to flush and aspirate well. The port was flushed with saline followed by 100 units/mL heparinized saline. The pocket was then closed in two layers using first subdermal inverted interrupted absorbable sutures followed by a running subcuticular suture. The epidermis was then sealed with  Dermabond. The dermatotomy at the venous access site was also sealed with Dermabond. IMPRESSION: Successful placement of a right IJ approach Power Port with ultrasound and fluoroscopic guidance. The catheter is ready for use. Signed, Criselda Peaches, MD [facility] Radiology Electronically Signed   By: Jacqulynn Cadet M.D.   On: 07/18/2017 10:21    ASSESSMENT & PLAN:  Carly [name] is a 77 y.o. Caucasian female with a history of hypothyroidism, GERD, Anxiety, osteopenia, Glaucoma, HTN, hypercholesterolemia, and H/o Uterine Cancer, presented with intermittent upper abdominal pain    1. 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  -She has locally advanced Adenocarcinoma of Pancreatic Head.  The EUS showed a 3.0 x 3.0 cm mass in the pancreatic head, with sonographic evidence suggesting invasion into the superior mesenteric artery, the portal vein,, the SMV, and splenic vein.  Unfortunately this is likely unresectable tumor, due to the invasion of SMA. -Staging scan was negative for metastasis. -Her case was discussed in our GI tumor board.  Images were reviewed.  Our surgeon Dr. Barry Dienes concurs that her pancreatic cancer is not resectable. -I recommended induction chemo for 4-6 months followed by radiation if no disease progression. -we discussed the goal of therapy is palliative  -She started FOLFIRINOX on 07/19/17. She tolerated moderately well with moderate diarrhea and fatigue.  -She appears to have allergic reaction to adhesive on her chest, this is recovering. I recommend benadryl cream to help. - She tolerated full dose cycle 2 very well. Continue same dose.  -Will continue to monitor her Ca19-9 for chemo response.  -I plan to scan her after cycle 5 chemo.    2. 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 She should reduce sugary fruits and high carb meets and increase vegetable.  -She was previously advised to continue to exercise as tolerable and check her Blood glucose at home in the morning and during the day. -Glucose at 143 today (08/16/17)    3. Diarrhea, Abdominal pain and weight loss -She has previously met with our dietician Pamala Hurry Nef.  I encouraged her to take a nutritional supplement -She has been using tramadol as needed for pain. I previously prescribed hydrocodone for her, in case her pain gets worse during the trip. -I reviewed  her pain medication use. She can use Tramadol for moderate pain and hydrocodone for worse pain.  -She had mild weight loss after diarrhea, I encouraged her to increase calorie and protein intake and to work to gain weight.  -She previously increased her Imodium and I prescribed Lomotil on 07/26/17 to take if imodium is not enough.    4. H/o Uterine Cancer, Stage I, diagnosed in 1981 -She had a total hysterectomy and BSO in 1981. She had no following treatments -Followed by her Gynecologist with yearly pap smears.   5. Genetic Testing  -Given her history of uterine cancer and family history of breast cancer she is eligible for genetic testing.  -She is interested. I previously sent genetic counseling referral. She went on 08/08/2017.  -genetic test is negative   6. Social Support -She lives alone but has good familial support.  -I previously recommended that she has someone check on her weekly especially when on chemo treatment.  -I previously sent a SW referral and she has previously met with them  7.Goal of care discussion  -We again discussed the incurable nature of her cancer, and the overall poor prognosis, especially if she does not have good response to chemotherapy or  progress on chemo -The patient understands the goal of care is palliative. -she is full code now   8. HTN, Hypothyroidism  -She is on HCTZ as needed and Lisinopril daily  -She is on Levothyroxine daily -I discussed with Chemo her BP may becomes low. I recommended if she develops this or dizziness I suggest she hold HCTZ first and continue to monitor her BP at home.  -Continue to Follow up with PCP for management and medication.   PLAN:  -Lab reviewed, adequate for treatment, will proceed to cycle 3 FOLFIRINOX today  -f/u and cycle 4 chemo in 2 weeks    No orders of the defined types were placed in this encounter.   All questions were answered. The patient knows to call the clinic with any problems, questions or concerns. I spent 20 minutes counseling the patient face to face. The total time spent in the appointment was 25 minutes and more than 50% was on counseling.  Dierdre Searles Dweik am acting as scribe for Dr. Truitt Merle.  I have reviewed the above documentation for accuracy and completeness, and I agree with the above.     Truitt Merle, MD 08/16/2017

## 2017-08-15 NOTE — Telephone Encounter (Signed)
Revealed negative genetic testing.  Discussed that we do not know why she has pancreatic and a history of uterine cancer or why there is cancer in the family. It could be due to a different gene that we are not testing, or maybe our current technology may not be able to pick something up.  It will be important for her to keep in contact with genetics to keep up with whether additional testing may be needed.  Revealed that RAD51C VUS is still considered a normal results.  We will recontact her once this is reclassified.

## 2017-08-16 ENCOUNTER — Inpatient Hospital Stay: Payer: Medicare Other

## 2017-08-16 ENCOUNTER — Telehealth: Payer: Self-pay | Admitting: Hematology

## 2017-08-16 ENCOUNTER — Inpatient Hospital Stay (HOSPITAL_BASED_OUTPATIENT_CLINIC_OR_DEPARTMENT_OTHER): Payer: Medicare Other | Admitting: Hematology

## 2017-08-16 ENCOUNTER — Encounter: Payer: Self-pay | Admitting: Hematology

## 2017-08-16 ENCOUNTER — Ambulatory Visit: Payer: Self-pay | Admitting: Genetic Counselor

## 2017-08-16 VITALS — BP 141/81 | HR 91 | Temp 97.9°F | Resp 19 | Ht 62.5 in | Wt 144.8 lb

## 2017-08-16 DIAGNOSIS — Z7189 Other specified counseling: Secondary | ICD-10-CM

## 2017-08-16 DIAGNOSIS — C250 Malignant neoplasm of head of pancreas: Secondary | ICD-10-CM

## 2017-08-16 DIAGNOSIS — I10 Essential (primary) hypertension: Secondary | ICD-10-CM

## 2017-08-16 DIAGNOSIS — M8580 Other specified disorders of bone density and structure, unspecified site: Secondary | ICD-10-CM

## 2017-08-16 DIAGNOSIS — Z1379 Encounter for other screening for genetic and chromosomal anomalies: Secondary | ICD-10-CM

## 2017-08-16 DIAGNOSIS — E039 Hypothyroidism, unspecified: Secondary | ICD-10-CM

## 2017-08-16 DIAGNOSIS — Z8542 Personal history of malignant neoplasm of other parts of uterus: Secondary | ICD-10-CM

## 2017-08-16 DIAGNOSIS — R739 Hyperglycemia, unspecified: Secondary | ICD-10-CM

## 2017-08-16 DIAGNOSIS — Z452 Encounter for adjustment and management of vascular access device: Secondary | ICD-10-CM | POA: Diagnosis not present

## 2017-08-16 DIAGNOSIS — R197 Diarrhea, unspecified: Secondary | ICD-10-CM

## 2017-08-16 DIAGNOSIS — Z5111 Encounter for antineoplastic chemotherapy: Secondary | ICD-10-CM | POA: Diagnosis not present

## 2017-08-16 DIAGNOSIS — D6959 Other secondary thrombocytopenia: Secondary | ICD-10-CM | POA: Diagnosis not present

## 2017-08-16 DIAGNOSIS — E119 Type 2 diabetes mellitus without complications: Secondary | ICD-10-CM

## 2017-08-16 LAB — CBC WITH DIFFERENTIAL (CANCER CENTER ONLY)
BASOS ABS: 0 10*3/uL (ref 0.0–0.1)
BASOS PCT: 0 %
Eosinophils Absolute: 0.3 10*3/uL (ref 0.0–0.5)
Eosinophils Relative: 2 %
HEMATOCRIT: 32.1 % — AB (ref 34.8–46.6)
Hemoglobin: 11.2 g/dL — ABNORMAL LOW (ref 11.6–15.9)
LYMPHS PCT: 11 %
Lymphs Abs: 1.4 10*3/uL (ref 0.9–3.3)
MCH: 31.8 pg (ref 25.1–34.0)
MCHC: 35 g/dL (ref 31.5–36.0)
MCV: 90.8 fL (ref 79.5–101.0)
Monocytes Absolute: 0.9 10*3/uL (ref 0.1–0.9)
Monocytes Relative: 7 %
NEUTROS ABS: 11 10*3/uL — AB (ref 1.5–6.5)
Neutrophils Relative %: 80 %
Platelet Count: 133 10*3/uL — ABNORMAL LOW (ref 145–400)
RBC: 3.53 MIL/uL — AB (ref 3.70–5.45)
RDW: 14.3 % (ref 11.2–14.5)
WBC: 13.7 10*3/uL — AB (ref 3.9–10.3)

## 2017-08-16 LAB — CMP (CANCER CENTER ONLY)
ALT: 20 U/L (ref 0–55)
ANION GAP: 9 (ref 3–11)
AST: 24 U/L (ref 5–34)
Albumin: 3.1 g/dL — ABNORMAL LOW (ref 3.5–5.0)
Alkaline Phosphatase: 119 U/L (ref 40–150)
BILIRUBIN TOTAL: 0.5 mg/dL (ref 0.2–1.2)
BUN: 13 mg/dL (ref 7–26)
CALCIUM: 9.2 mg/dL (ref 8.4–10.4)
CO2: 25 mmol/L (ref 22–29)
Chloride: 107 mmol/L (ref 98–109)
Creatinine: 0.75 mg/dL (ref 0.60–1.10)
Glucose, Bld: 143 mg/dL — ABNORMAL HIGH (ref 70–140)
POTASSIUM: 3.5 mmol/L (ref 3.5–5.1)
Sodium: 141 mmol/L (ref 136–145)
Total Protein: 5.9 g/dL — ABNORMAL LOW (ref 6.4–8.3)

## 2017-08-16 MED ORDER — PALONOSETRON HCL INJECTION 0.25 MG/5ML
0.2500 mg | Freq: Once | INTRAVENOUS | Status: AC
  Administered 2017-08-16: 0.25 mg via INTRAVENOUS

## 2017-08-16 MED ORDER — DEXAMETHASONE SODIUM PHOSPHATE 10 MG/ML IJ SOLN
INTRAMUSCULAR | Status: AC
  Filled 2017-08-16: qty 1

## 2017-08-16 MED ORDER — PALONOSETRON HCL INJECTION 0.25 MG/5ML
INTRAVENOUS | Status: AC
  Filled 2017-08-16: qty 5

## 2017-08-16 MED ORDER — SODIUM CHLORIDE 0.9 % IV SOLN
2400.0000 mg/m2 | INTRAVENOUS | Status: DC
  Administered 2017-08-16: 4100 mg via INTRAVENOUS
  Filled 2017-08-16: qty 82

## 2017-08-16 MED ORDER — DEXTROSE 5 % IV SOLN
Freq: Once | INTRAVENOUS | Status: AC
  Administered 2017-08-16: 10:00:00 via INTRAVENOUS

## 2017-08-16 MED ORDER — DEXAMETHASONE SODIUM PHOSPHATE 10 MG/ML IJ SOLN
10.0000 mg | Freq: Once | INTRAMUSCULAR | Status: AC
  Administered 2017-08-16: 10 mg via INTRAVENOUS

## 2017-08-16 MED ORDER — LEUCOVORIN CALCIUM INJECTION 350 MG
400.0000 mg/m2 | Freq: Once | INTRAVENOUS | Status: AC
  Administered 2017-08-16: 680 mg via INTRAVENOUS
  Filled 2017-08-16: qty 34

## 2017-08-16 MED ORDER — ATROPINE SULFATE 1 MG/ML IJ SOLN
0.5000 mg | Freq: Once | INTRAMUSCULAR | Status: AC | PRN
  Administered 2017-08-16: 0.5 mg via INTRAVENOUS

## 2017-08-16 MED ORDER — IRINOTECAN HCL CHEMO INJECTION 100 MG/5ML
150.0000 mg/m2 | Freq: Once | INTRAVENOUS | Status: AC
  Administered 2017-08-16: 260 mg via INTRAVENOUS
  Filled 2017-08-16: qty 13

## 2017-08-16 MED ORDER — OXALIPLATIN CHEMO INJECTION 100 MG/20ML
87.0000 mg/m2 | Freq: Once | INTRAVENOUS | Status: AC
  Administered 2017-08-16: 150 mg via INTRAVENOUS
  Filled 2017-08-16: qty 10

## 2017-08-16 NOTE — Progress Notes (Signed)
HPI:  Carly [name] was previously seen in the [place] clinic due to a personal and family history of cancer and concerns regarding a hereditary predisposition to cancer. Please refer to our prior cancer genetics clinic note for more information regarding Carly [name]'s medical, social and family histories, and our assessment and recommendations, at the time. Carly [name] recent genetic test results were disclosed to her, as were recommendations warranted by these results. These results and recommendations are discussed in more detail below.  CANCER HISTORY:  Oncology History   Cancer Staging Pancreatic cancer [facility]) Staging form: Exocrine Pancreas, AJCC 8th Edition - Clinical stage from 06/13/2017: Stage III (cT4, cN0, cM0) - Signed by Truitt Merle, MD on 06/13/2017       Pancreatic cancer ([place])   06/13/2017 Initial Diagnosis    Pancreatic cancer ([place])      06/13/2017 Cancer Staging    Staging form: Exocrine Pancreas, AJCC 8th Edition - Clinical stage from 06/13/2017: Stage III (cT4, cN0, cM0) - Signed by Truitt Merle, MD on 06/13/2017      06/13/2017 Procedure    EUS by Dr. Paulita Fujita 06/13/17  IMPRESSION:  -There was no sign of significant pathology in the ampulla. - There was no evidence of significant pathology in the left lobe of the liver. - A mass was identified in the pancreatic head. A[place]ment SMV; invasion portal confluence; no adenopathy. Fine needle aspiration performed. If this is a pancreatic adenocarcinoma, it would be staged T4/N0/Mx by EUS.      06/13/2017 Initial Biopsy    Diagnosis 06/13/17 FINE NEEDLE ASPIRATION, ENDOSCOPIC, PANCREAS HEAD (SPECIMEN 1 OF 1 COLLECTED 06/13/17): ADENOCARCINOMA. Preliminary Diagnosis Intraoperative Diagnosis: 1-3) ATYPICAL CELLS, ADDITIONAL MATERIAL REQUESTED. (NDK) 4-6 ADENOCARCINOMA. (NDK) Reported to Dr. Paulita Fujita on 06/13/17 @ 11:07am.       06/21/2017 Imaging    CT Chest without contrast IMPRESSION: 1. No findings  suspicious for metastatic disease within the chest.  2. Tiny subpleural right lower lobe pulmonary nodule is nonspecific, but likely benign. This can be addressed on follow-up imaging.  3. Aortic Atherosclerosis (ICD10-I70.0).      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  The Hereditary Gene Panel offered by [facility] includes sequencing and/or deletion duplication testing of the following 47 genes: APC, ATM, AXIN2, BARD1, BMPR1A, BRCA1, BRCA2, BRIP1, CDH1, CDK4, CDKN2A (p14ARF), CDKN2A (p16INK4a), CHEK2, CTNNA1, DICER1, EPCAM (Deletion/duplication testing only), GREM1 (promoter region deletion/duplication testing only), KIT, MEN1, MLH1, MSH2, MSH3, MSH6, MUTYH, NBN, NF1, NHTL1, PALB2, PDGFRA, PMS2, POLD1, POLE, PTEN, RAD50, RAD51C, RAD51D, SDHB, SDHC, SDHD, SMAD4, SMARCA4. STK11, TP53, TSC1, TSC2, and VHL.  The following genes were evaluated for sequence changes only: SDHA and HOXB13 c.251G>A variant only. The report date is August 14, 2017.       FAMILY HISTORY:  We obtained a detailed, 4-generation family history.  Significant diagnoses are listed below: Family History  Problem Relation Age of Onset  . Stroke Mother 59  . Heart attack Father 55  . Dementia Brother   . Hypertension Brother   . Skin cancer Brother   . Breast cancer Maternal Grandmother        dx >50  . Breast cancer Cousin        mat first cousin, dx in her 46s  . Cancer Cousin        oral  cancer  . Breast cancer Cousin        mat first cousin, dx in her 54s  . Heart disease Maternal Uncle   . Other Maternal Grandfather        suicide  . Heart disease Paternal Grandmother     The patient has two sons who are cancer free.  She has two brothers, one died of heart disease and the other had skin cancer.  Both parents are deceased.  Her mother died of a stroke at 25 and her father  died of a heart attack at 69.  The patient's mother had a brother and sister, neither who had cancer.  However, the brother had two daughters with breast cancer in their 57's.  The maternal grandparents are deceased.  The grandfather committed suicide during the depression and the grandmother was diagnosed with breast cancer over age 62.  The patient's father had seven siblings, none who were reported with cancer.  The paternal grandparents are deceased.  The grandmother died of heart disease and the grandfather died from "impaction".  Carly [name] is unaware of previous family history of genetic testing for hereditary cancer risks. Patient's maternal ancestors are of Caucasian-NOS descent, and paternal ancestors are of [place] descent. There is no reported Ashkenazi Jewish ancestry. There is no known consanguinity.  GENETIC TEST RESULTS: Genetic testing reported out on August 14, 2017 through the Multi cancer panel cancer panel found no deleterious mutations.  The Multi-Gene Panel offered by [facility] includes sequencing and/or deletion duplication testing of the following 83 genes: ALK, APC, ATM, AXIN2,BAP1,  BARD1, BLM, BMPR1A, BRCA1, BRCA2, BRIP1, CASR, CDC73, CDH1, CDK4, CDKN1B, CDKN1C, CDKN2A (p14ARF), CDKN2A (p16INK4a), CEBPA, CHEK2, CTNNA1, DICER1, DIS3L2, EGFR (c.2369C>T, p.Thr790Met variant only), EPCAM (Deletion/duplication testing only), FH, FLCN, GATA2, GPC3, GREM1 (Promoter region deletion/duplication testing only), HOXB13 (c.251G>A, p.Gly84Glu), HRAS, KIT, MAX, MEN1, MET, MITF (c.952G>A, p.Glu318Lys variant only), MLH1, MSH2, MSH3, MSH6, MUTYH, NBN, NF1, NF2, NTHL1, PALB2, PDGFRA, PHOX2B, PMS2, POLD1, POLE, POT1, PRKAR1A, PTCH1, PTEN, RAD50, RAD51C, RAD51D, RB1, RECQL4, RET, RUNX1, SDHAF2, SDHA (sequence changes only), SDHB, SDHC, SDHD, SMAD4, SMARCA4, SMARCB1, SMARCE1, STK11, SUFU, TERT, TERT, TMEM127, TP53, TSC1, TSC2, VHL, WRN and WT1.  The test report has been scanned into EPIC and is  located under the Molecular Pathology section of the Results Review tab.    We discussed with Ms. Cacioppo that since the current genetic testing is not perfect, it is possible there may be a gene mutation in one of these genes that current testing cannot detect, but that chance is small.  We also discussed, that it is possible that another gene that has not yet been discovered, or that we have not yet tested, is responsible for the cancer diagnoses in the family, and it is, therefore, important to remain in touch with cancer genetics in the future so that we can continue to offer Ms. Millon the most up to date genetic testing.   Genetic testing did detect a Variant of Unknown Significance in the RAD51C gene called c.14C>T.  At this time, it is unknown if this variant is associated with increased cancer risk or if this is a normal finding, but most variants such as this get reclassified to being inconsequential. It should not be used to make medical management decisions. With time, we suspect the lab will determine the significance of this variant, if any. If we do learn more about it, we will try to contact Ms. Karren to discuss it further. However, it is important  to stay in touch with [place] periodically and keep the address and phone number up to date.  CANCER SCREENING RECOMMENDATIONS: This result is reassuring and indicates that Ms. Noguera likely does not have an increased risk for a future cancer due to a mutation in one of these genes. This normal test also suggests that Ms. Mckiddy's cancer was most likely not due to an inherited predisposition associated with one of these genes.  Most cancers happen by chance and this negative test suggests that her cancer falls into this category.  We, therefore, recommended she continue to follow the cancer management and screening guidelines provided by her oncology and primary healthcare provider.   An individual's cancer risk and medical management are  not determined by genetic test results alone. Overall cancer risk assessment incorporates additional factors, including personal medical history, family history, and any available genetic information that may result in a personalized plan for cancer prevention and surveillance.  RECOMMENDATIONS FOR FAMILY MEMBERS:  Women in this family might be at some increased risk of developing cancer, over the general population risk, simply due to the family history of cancer.  We recommended women in this family have a yearly mammogram beginning at age 22, or 90 years younger than the earliest onset of cancer, an annual clinical breast exam, and perform monthly breast self-exams. Women in this family should also have a gynecological exam as recommended by their primary provider. All family members should have a colonoscopy by age 44.  FOLLOW-UP: Lastly, we discussed with Ms. Ilagan that cancer genetics is a rapidly advancing field and it is possible that new genetic tests will be appropriate for her and/or her family members in the future. We encouraged her to remain in contact with cancer genetics on an annual basis so we can update her personal and family histories and let her know of advances in cancer genetics that may benefit this family.   Our contact number was provided. Ms. Keinath questions were answered to her satisfaction, and she knows she is welcome to call us at anytime with additional questions or concerns.   Roma Kayser, MS, [facility] Certified Genetic Counselor [email or web address]

## 2017-08-16 NOTE — Telephone Encounter (Signed)
Gave pt avs and calendar with appts per 6/20 los.

## 2017-08-16 NOTE — Patient Instructions (Signed)
[facility] Discharge Instructions for Patients Receiving Chemotherapy  Today you received the following chemotherapy agents Oxaliplatin, Irinotecan, Leucovorin, and 5FU  To help prevent nausea and vomiting after your treatment, we encourage you to take your nausea medication as directed   If you develop nausea and vomiting that is not controlled by your nausea medication, call the clinic.   BELOW ARE SYMPTOMS THAT SHOULD BE REPORTED IMMEDIATELY:  *FEVER GREATER THAN 100.5 F  *CHILLS WITH OR WITHOUT FEVER  NAUSEA AND VOMITING THAT IS NOT CONTROLLED WITH YOUR NAUSEA MEDICATION  *UNUSUAL SHORTNESS OF BREATH  *UNUSUAL BRUISING OR BLEEDING  TENDERNESS IN MOUTH AND THROAT WITH OR WITHOUT PRESENCE OF ULCERS  *URINARY PROBLEMS  *BOWEL PROBLEMS  UNUSUAL RASH Items with * indicate a potential emergency and should be followed up as soon as possible.  Feel free to call the clinic should you have any questions or concerns. The clinic phone number is [phone].  Please show the CHEMO ALERT CARD at check-in to the Emergency Department and triage nurse.   

## 2017-08-17 LAB — CANCER ANTIGEN 19-9: CA 19-9: 606 U/mL — ABNORMAL HIGH (ref 0–35)

## 2017-08-18 ENCOUNTER — Inpatient Hospital Stay: Payer: Medicare Other

## 2017-08-18 VITALS — BP 137/73 | HR 96 | Temp 98.4°F | Resp 18

## 2017-08-18 DIAGNOSIS — Z5111 Encounter for antineoplastic chemotherapy: Secondary | ICD-10-CM | POA: Diagnosis not present

## 2017-08-18 DIAGNOSIS — C250 Malignant neoplasm of head of pancreas: Secondary | ICD-10-CM | POA: Diagnosis not present

## 2017-08-18 DIAGNOSIS — E039 Hypothyroidism, unspecified: Secondary | ICD-10-CM | POA: Diagnosis not present

## 2017-08-18 DIAGNOSIS — Z8542 Personal history of malignant neoplasm of other parts of uterus: Secondary | ICD-10-CM | POA: Diagnosis not present

## 2017-08-18 DIAGNOSIS — I10 Essential (primary) hypertension: Secondary | ICD-10-CM | POA: Diagnosis not present

## 2017-08-18 DIAGNOSIS — Z7189 Other specified counseling: Secondary | ICD-10-CM

## 2017-08-18 DIAGNOSIS — Z452 Encounter for adjustment and management of vascular access device: Secondary | ICD-10-CM | POA: Diagnosis not present

## 2017-08-18 MED ORDER — PEGFILGRASTIM INJECTION 6 MG/0.6ML ~~LOC~~
6.0000 mg | PREFILLED_SYRINGE | Freq: Once | SUBCUTANEOUS | Status: AC
  Administered 2017-08-18: 6 mg via SUBCUTANEOUS

## 2017-08-18 MED ORDER — PEGFILGRASTIM INJECTION 6 MG/0.6ML ~~LOC~~
PREFILLED_SYRINGE | SUBCUTANEOUS | Status: AC
  Filled 2017-08-18: qty 0.6

## 2017-08-18 MED ORDER — HEPARIN SOD (PORK) LOCK FLUSH 100 UNIT/ML IV SOLN
500.0000 [IU] | Freq: Once | INTRAVENOUS | Status: AC | PRN
  Administered 2017-08-18: 500 [IU]
  Filled 2017-08-18: qty 5

## 2017-08-18 MED ORDER — SODIUM CHLORIDE 0.9% FLUSH
10.0000 mL | INTRAVENOUS | Status: DC | PRN
  Administered 2017-08-18: 10 mL
  Filled 2017-08-18: qty 10

## 2017-08-18 NOTE — Patient Instructions (Signed)
Pegfilgrastim injection What is this medicine? PEGFILGRASTIM (PEG fil gra stim) is a long-acting granulocyte colony-stimulating factor that stimulates the growth of neutrophils, a type of white blood cell important in the body's fight against infection. It is used to reduce the incidence of fever and infection in patients with certain types of cancer who are receiving chemotherapy that affects the bone marrow, and to increase survival after being exposed to high doses of radiation. This medicine may be used for other purposes; ask your health care provider or pharmacist if you have questions. COMMON BRAND NAME(S): Neulasta What should I tell my health care provider before I take this medicine? They need to know if you have any of these conditions: -kidney disease -latex allergy -ongoing radiation therapy -sickle cell disease -skin reactions to acrylic adhesives (On-Body Injector only) -an unusual or allergic reaction to pegfilgrastim, filgrastim, other medicines, foods, dyes, or preservatives -pregnant or trying to get pregnant -breast-feeding How should I use this medicine? This medicine is for injection under the skin. If you get this medicine at home, you will be taught how to prepare and give the pre-filled syringe or how to use the On-body Injector. Refer to the patient Instructions for Use for detailed instructions. Use exactly as directed. Tell your healthcare provider immediately if you suspect that the On-body Injector may not have performed as intended or if you suspect the use of the On-body Injector resulted in a missed or partial dose. It is important that you put your used needles and syringes in a special sharps container. Do not put them in a trash can. If you do not have a sharps container, call your pharmacist or healthcare provider to get one. Talk to your pediatrician regarding the use of this medicine in children. While this drug may be prescribed for selected conditions,  precautions do apply. Overdosage: If you think you have taken too much of this medicine contact a poison control center or emergency room at once. NOTE: This medicine is only for you. Do not share this medicine with others. What if I miss a dose? It is important not to miss your dose. Call your doctor or health care professional if you miss your dose. If you miss a dose due to an On-body Injector failure or leakage, a new dose should be administered as soon as possible using a single prefilled syringe for manual use. What may interact with this medicine? Interactions have not been studied. Give your health care provider a list of all the medicines, herbs, non-prescription drugs, or dietary supplements you use. Also tell them if you smoke, drink alcohol, or use illegal drugs. Some items may interact with your medicine. This list may not describe all possible interactions. Give your health care provider a list of all the medicines, herbs, non-prescription drugs, or dietary supplements you use. Also tell them if you smoke, drink alcohol, or use illegal drugs. Some items may interact with your medicine. What should I watch for while using this medicine? You may need blood work done while you are taking this medicine. If you are going to need a MRI, CT scan, or other procedure, tell your doctor that you are using this medicine (On-Body Injector only). What side effects may I notice from receiving this medicine? Side effects that you should report to your doctor or health care professional as soon as possible: -allergic reactions like skin rash, itching or hives, swelling of the face, lips, or tongue -dizziness -fever -pain, redness, or irritation at site   where injected -pinpoint red spots on the skin -red or dark-brown urine -shortness of breath or breathing problems -stomach or side pain, or pain at the shoulder -swelling -tiredness -trouble passing urine or change in the amount of urine Side  effects that usually do not require medical attention (report to your doctor or health care professional if they continue or are bothersome): -bone pain -muscle pain This list may not describe all possible side effects. Call your doctor for medical advice about side effects. You may report side effects to FDA at 1-800-FDA-1088. Where should I keep my medicine? Keep out of the reach of children. Store pre-filled syringes in a refrigerator between 2 and 8 degrees C (36 and 46 degrees F). Do not freeze. Keep in carton to protect from light. Throw away this medicine if it is left out of the refrigerator for more than 48 hours. Throw away any unused medicine after the expiration date. NOTE: This sheet is a summary. It may not cover all possible information. If you have questions about this medicine, talk to your doctor, pharmacist, or health care provider.  2018 [employer] (2016-02-10 12:58:03)  

## 2017-08-28 NOTE — Progress Notes (Signed)
[place]  Telephone:([phone]) [phone] [phone]) [phone]  Clinic Follow Up Note   Patient Care Team: Darcus Austin, MD as PCP - General (Family Medicine) Arta Silence, MD as Consulting Physician (Gastroenterology) Truitt Merle, MD as Consulting Physician (Hematology).  Date of Service:  08/29/2017  CHIEF COMPLAINTS:  F/u for Pancreatic Cancer, stage III   Oncology History   Cancer Staging Pancreatic cancer [facility]) Staging form: Exocrine Pancreas, AJCC 8th Edition - Clinical stage from 06/13/2017: Stage III (cT4, cN0, cM0) - Signed by Truitt Merle, MD on 06/13/2017       Pancreatic cancer ([place])   06/13/2017 Initial Diagnosis    Pancreatic cancer ([place])      06/13/2017 Cancer Staging    Staging form: Exocrine Pancreas, AJCC 8th Edition - Clinical stage from 06/13/2017: Stage III (cT4, cN0, cM0) - Signed by Truitt Merle, MD on 06/13/2017      06/13/2017 Procedure    EUS by Dr. Paulita Fujita 06/13/17  IMPRESSION:  -There was no sign of significant pathology in the ampulla. - There was no evidence of significant pathology in the left lobe of the liver. - A mass was identified in the pancreatic head. Abutment SMV; invasion portal confluence; no adenopathy. Fine needle aspiration performed. If this is a pancreatic adenocarcinoma, it would be staged T4/N0/Mx by EUS.      06/13/2017 Initial Biopsy    Diagnosis 06/13/17 FINE NEEDLE ASPIRATION, ENDOSCOPIC, PANCREAS HEAD (SPECIMEN 1 OF 1 COLLECTED 06/13/17): ADENOCARCINOMA. Preliminary Diagnosis Intraoperative Diagnosis: 1-3) ATYPICAL CELLS, ADDITIONAL MATERIAL REQUESTED. (NDK) 4-6 ADENOCARCINOMA. (NDK) Reported to Dr. Paulita Fujita on 06/13/17 @ 11:07am.       06/21/2017 Imaging    CT Chest without contrast IMPRESSION: 1. No findings suspicious for metastatic disease within the chest.  2. Tiny subpleural right lower lobe pulmonary nodule is nonspecific, but likely benign. This can be addressed on follow-up imaging.  3. Aortic  Atherosclerosis (ICD10-I70.0).      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  The Hereditary Gene Panel offered by [facility] includes sequencing and/or deletion duplication testing of the following 47 genes: APC, ATM, AXIN2, BARD1, BMPR1A, BRCA1, BRCA2, BRIP1, CDH1, CDK4, CDKN2A (p14ARF), CDKN2A (p16INK4a), CHEK2, CTNNA1, DICER1, EPCAM (Deletion/duplication testing only), GREM1 (promoter region deletion/duplication testing only), KIT, MEN1, MLH1, MSH2, MSH3, MSH6, MUTYH, NBN, NF1, NHTL1, PALB2, PDGFRA, PMS2, POLD1, POLE, PTEN, RAD50, RAD51C, RAD51D, SDHB, SDHC, SDHD, SMAD4, SMARCA4. STK11, TP53, TSC1, TSC2, and VHL.  The following genes were evaluated for sequence changes only: SDHA and HOXB13 c.251G>A variant only. The report date is August 14, 2017.        HISTORY OF PRESENTING ILLNESS: 06/14/17 Carly [name] 77 y.o. female is a here because of newly diagnosed Pancreatic Cancer. The patient was referred by Dr. Paulita Fujita. The patient presents to the clinic today accompanied by her 2 sons and her daughter-in-law.   She was having aching of her left abdominal starting 3 months ago after UTI. This pain comes and goes, which worsened at night. She denies issues with her appetite overall but she does not eat as much as she did due to the recent death of her husband. She denies any abdominal bloating. The color of urine has not become dark.   Today the patient notes she still has left abdominal pain. She notes she plans to see her grandson in  [place] this weekend. She plans to go to the beach for 1-2 weeks in May, 2018. She prefers to be reached by her home phone and then her daughter-in-law.  Socially she lives by herself since her husband's passing in 12/2016. Her house is 1 level. She has support from her son and his wife. She has good family support. She has a  stationary bike he rides 30 minutes a day and keeps track of her steps per day. She is very active and goes out often. 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   In the past the patient had a complete hysterectomy in early 1981 for bleeding. During her surgery she had cancer cells found and had a BSO. She had no following treatments. She had a pelvic rebuilt in 1990s due to prolapse. She had a yearly pap smear. She is diagnosed os type II DM but is not on any medication at this time.  She takes tramadol for the pain as needed, trazodone to help her sleep and takes XANAX for her anxiety. She has been on this for years. She has been on Trazodone for 40 years to also help her anxiety. She takes Lipitor for her high cholesterol, not regularly. She takes HCTZ for her fluid retention which she takes as needed. She takes Lisinopril for her HTN. She takes Synthroid for her hypothyroidism.  Her maternal grandmother had breast cancer. Her maternal cousin had breast cancer and her paternal cousin had a blood disorder. She smoked for 10-15 years of 1/2-1 pack a day before stopping in 1980s.   On review of symptoms, pt notes left abdominal ache which she tried tylenol and she was recently given Tramadol. This helped her some. She denies abdominal bloating, dark urine or jaundice and denies dysuria.  CURRENT THERAPY: chemo FOLFIRINOX every 2 weeks started 07/19/17   INTERVAL HISTORY:  Carly [name] is a 77 y.o. female who presents today for follow-up and Cycle 4 FOLFIRINOX. She is here with her son. Since her last cycle, she experienced nausea and diarrhea for a few days.Her diarrhea comes on 4-5 times a day. She doesn't like imodium due to bloating. She also has very mild lower abdominal pain. She is concerned about hair loss. She has also lost 4 ibs since last visit.  She takes Tylenol and hydrocodone for her pain.     MEDICAL HISTORY:  Past Medical History:    Diagnosis Date  . Allergic rhinitis    Skin Test 04-14-2009  . Asthma   . Cancer ([place]) 1981   adenocarcinoma of uterus  . Diabetes mellitus without complication ([place])    type II, no meds per pt  . Family history of breast cancer   . History of uterine cancer   . Hyperlipemia   . Hypertension   . Insomnia     SURGICAL HISTORY: Past Surgical History:  Procedure Laterality Date  . ABDOMINAL HYSTERECTOMY    . CARPAL TUNNEL RELEASE    . EUS N/A 06/13/2017   Procedure: FULL UPPER ENDOSCOPIC ULTRASOUND (EUS) RADIAL;  Surgeon: Arta Silence, MD;  Location: [facility];  Service: Endoscopy;  Laterality: N/A;  . I&D EXTREMITY  09/17/2011   Procedure: IRRIGATION AND DEBRIDEMENT EXTREMITY;  Surgeon: Roseanne Kaufman, MD;  Location: [facility];  Service: Orthopedics;  Laterality: Left;  with drain placement  . IR FLUORO GUIDE PORT INSERTION RIGHT  07/18/2017  . IR US GUIDE VASC ACCESS RIGHT  07/18/2017  . NASAL SEPTOPLASTY  W/ TURBINOPLASTY    . pelvic floor rebuild    . TONSILLECTOMY    . TOTAL ABDOMINAL HYSTERECTOMY W/ BILATERAL SALPINGOOPHORECTOMY      SOCIAL HISTORY: Social History   Socioeconomic History  . Marital status: Widowed    Spouse name: Not on file  . Number of children: Not on file  . Years of education: Not on file  . Highest education level: Not on file  Occupational History  . Not on file  Social Needs  . Financial resource strain: Not on file  . Food insecurity:    Worry: Not on file    Inability: Not on file  . Transportation needs:    Medical: Not on file    Non-medical: Not on file  Tobacco Use  . Smoking status: Former Smoker    Packs/day: 0.50    Years: 15.00    Pack years: 7.50    Types: Cigarettes    Last attempt to quit: 02/27/1978    Years since quitting: 39.5  . Smokeless tobacco: Never Used  Substance and Sexual Activity  . Alcohol use: Yes    Alcohol/week: 0.0 oz    Comment: occ- glass of wine  . Drug use: No  . Sexual activity: Yes     Comment: 1st intercouse- 15, partners- 1  Lifestyle  . Physical activity:    Days per week: Not on file    Minutes per session: Not on file  . Stress: Not on file  Relationships  . Social connections:    Talks on phone: Not on file    Gets together: Not on file    Attends religious service: Not on file    Active member of club or organization: Not on file    Attends meetings of clubs or organizations: Not on file    Relationship status: Not on file  . Intimate partner violence:    Fear of current or ex partner: Not on file    Emotionally abused: Not on file    Physically abused: Not on file    Forced sexual activity: Not on file  Other Topics Concern  . Not on file  Social History Narrative  . Not on file    FAMILY HISTORY: Family History  Problem Relation Age of Onset  . Stroke Mother 42  . Heart attack Father 26  . Dementia Brother   . Hypertension Brother   . Skin cancer Brother   . Breast cancer Maternal Grandmother        dx >50  . Breast cancer Cousin        mat first cousin, dx in her 26s  . Cancer Cousin        oral cancer  . Breast cancer Cousin        mat first cousin, dx in her 73s  . Heart disease Maternal Uncle   . Other Maternal Grandfather        suicide  . Heart disease Paternal Grandmother     ALLERGIES:  is allergic to cephalexin and nabumetone.  MEDICATIONS:  Current Outpatient Medications  Medication Sig Dispense Refill  . ALPRAZolam (XANAX) 0.5 MG tablet Take 1 tablet (0.5 mg total) by mouth at bedtime as needed for anxiety. 90 tablet 0  . Ascorbic Acid (VITAMIN C) 500 MG PACK Take 100 mg by mouth daily.    Marland Kitchen atorvastatin (LIPITOR) 10 MG tablet Take 10 mg by mouth at bedtime.     Marland Kitchen CALCIUM PO Take 1 tablet by mouth  daily.     . Cholecalciferol (VITAMIN D) 2000 units CAPS Take by mouth.    . Cyanocobalamin (VITAMIN B-12 PO) Take 1 tablet by mouth daily.     . diphenoxylate-atropine (LOMOTIL) 2.5-0.025 MG tablet Take 1-2 tablets by mouth 4  (four) times daily as needed for diarrhea or loose stools. 30 tablet 1  . hydrochlorothiazide (HYDRODIURIL) 25 MG tablet Take 1 tablet by mouth daily as needed.     Marland Kitchen HYDROcodone-acetaminophen (NORCO) 5-325 MG tablet Take 1 tablet by mouth every 6 (six) hours as needed for moderate pain. 30 tablet 0  . hyoscyamine (LEVBID) 0.375 MG 12 hr tablet Take 0.375 mg by mouth every 12 (twelve) hours as needed for cramping.     . latanoprost (XALATAN) 0.005 % ophthalmic solution 1 drop at bedtime.    Marland Kitchen levothyroxine (SYNTHROID, LEVOTHROID) 25 MCG tablet Take 25 mcg by mouth daily before breakfast.     . lidocaine-prilocaine (EMLA) cream Apply to affected area once 30 g 3  . lisinopril (PRINIVIL,ZESTRIL) 10 MG tablet Take 10 mg by mouth at bedtime.    . magic mouthwash w/lidocaine SOLN Take 5 mLs by mouth 3 (three) times daily as needed for mouth pain. 240 mL 0  . nitrofurantoin, macrocrystal-monohydrate, (MACROBID) 100 MG capsule Take 1 capsule (100 mg total) by mouth 2 (two) times daily. 10 capsule 0  . Omega-3 Fatty Acids (FISH OIL PO) Take 1 capsule by mouth daily.     Marland Kitchen omeprazole (PRILOSEC) 20 MG capsule Take 1 tablet by mouth daily.    . ondansetron (ZOFRAN) 8 MG tablet Take 1 tablet (8 mg total) by mouth 2 (two) times daily as needed for refractory nausea / vomiting. Start on day 3 after chemotherapy. 30 tablet 1  . ONETOUCH DELICA LANCETS 17P MISC USE TO TEST YOUR BLOOD SUGAR ONCE A DAY FASTING AND 2 HRS AFTER A MEAL AS NEEDED  3  . ONETOUCH VERIO test strip USE TO TEST YOUR BLOOD SUGAR ONCE A DAY FASTING & 2 HRS AFTER A MEAL AS NEEDED  3  . prochlorperazine (COMPAZINE) 10 MG tablet Take 1 tablet (10 mg total) by mouth every 6 (six) hours as needed (NAUSEA). 30 tablet 1  . traMADol (ULTRAM) 50 MG tablet Take 1-2 tablets every 6 hours as needed for pain 60 tablet 0  . traZODone (DESYREL) 100 MG tablet Take 1 tablet (100 mg total) by mouth at bedtime. (Patient taking differently: Take 50 mg by mouth at  bedtime. ) 90 tablet 3   No current facility-administered medications for this visit.     REVIEW OF SYSTEMS: Constitutional: Denies fevers, chills or abnormal night sweats  Eyes: Denies double vision or watery eyes, blurred vision Ears, nose, mouth, throat, and face: Denies mucositis or sore throat Respiratory: Denies cough, dyspnea or wheezes Cardiovascular: Denies palpitation, chest discomfort or lower extremity swelling Gastrointestinal:  Denies heartburn (+)  diarrhea (+) nausea (+) lower abdominal pain Urinary: Denies frequency, urgency, burning, or incontinence.  Skin: Denies abnormal skin rashes (+) hair loss Lymphatics: Denies new lymphadenopathy or easy bruising Neurological:Denies numbness, tingling or new weaknesses MSK: No myalgia Behavioral/Psych: Mood is stable, no new changes All other systems were reviewed with the patient and are negative.  PHYSICAL EXAMINATION: ECOG PERFORMANCE STATUS: 1 - Symptomatic but completely ambulatory  Vitals:   08/29/17 0906  BP: 122/67  Pulse: 89  Resp: 18  Temp: 98 F (36.7 C)  SpO2: 98%   Filed Weights   08/29/17 0906  Weight:  141 lb 11.2 oz (64.3 kg)    GENERAL:alert, no distress and comfortable (+) hair loss (+) weight loss SKIN: skin color, texture, turgor are normal, no rashes or significant lesions EYES: normal, conjunctiva are pink and non-injected, sclera clear OROPHARYNX:no exudate, no erythema and lips, buccal mucosa, and tongue normal  NECK: supple, thyroid normal size, non-tender, without nodularity LYMPH:  no palpable lymphadenopathy in the cervical, axillary or inguinal LUNGS: clear to auscultation and percussion with normal breathing effort HEART: regular rhythm and no murmurs and no lower extremity edema  ABDOMEN:abdomen soft, non-tender and normal bowel sounds Musculoskeletal:no cyanosis of digits and no clubbing  PSYCH: alert & oriented x 3 with fluent speech NEURO: no focal motor/sensory  deficits  LABORATORY DATA:  I have reviewed the data as listed CBC Latest Ref Rng & Units 08/16/2017 08/02/2017 07/26/2017  WBC 3.9 - 10.3 K/uL 13.7(H) 8.8 3.4(L)  Hemoglobin 11.6 - 15.9 g/dL 11.2(L) 11.0(L) 11.8  Hematocrit 34.8 - 46.6 % 32.1(L) 31.8(L) 33.8(L)  Platelets 145 - 400 K/uL 133(L) 123(L) 107(L)    CMP Latest Ref Rng & Units 08/16/2017 08/02/2017 07/26/2017  Glucose 70 - 140 mg/dL 143(H) 165(H) 239(H)  BUN 7 - 26 mg/dL _0 Creatinine 0.60 - 1.10 mg/dL 0.75 0.74 0.81  Sodium 136 - 145 mmol/L 141 138 135(L)  Potassium 3.5 - 5.1 mmol/L 3.5 3.7 4.0  Chloride 98 - 109 mmol/L 107 104 102  CO2 22 - 29 mmol/L _1 Calcium 8.4 - 10.4 mg/dL 9.2 9.1 8.9  Total Protein 6.4 - 8.3 g/dL 5.9(L) 5.9(L) 6.0(L)  Total Bilirubin 0.2 - 1.2 mg/dL 0.5 0.4 0.6  Alkaline Phos 40 - 150 U/L 119 93 83  AST 5 - 34 U/L _2 ALT 0 - 55 U/L _3 Results for Carly [name], Carly [name] (MRN [id number]) as of 08/28/2017 08:29  Ref. Range 07/19/2017 09:09 08/16/2017 08:25  CA 19-9 Latest Ref Range: 0 - 35 U/mL 1,436 (H) 606 (H)    PATHOLOGY Diagnosis 06/13/17 FINE NEEDLE ASPIRATION, ENDOSCOPIC, PANCREAS HEAD (SPECIMEN 1 OF 1 COLLECTED 06/13/17): ADENOCARCINOMA. Preliminary Diagnosis Intraoperative Diagnosis: 1-3) ATYPICAL CELLS, ADDITIONAL MATERIAL REQUESTED. (NDK) 4-6 ADENOCARCINOMA. (NDK) Reported to Dr. Paulita Fujita on 06/13/17 @ 11:07am.    PROCEDURES  EUS by Dr. Paulita Fujita 06/13/17  IMPRESSION:  -There was no sign of significant pathology in the ampulla. - There was no evidence of significant pathology in the left lobe of the liver. - A mass was identified in the pancreatic head. Abutment SMV; invasion portal confluence; no adenopathy. Fine needle aspiration performed. If this is a pancreatic adenocarcinoma, it would be staged T4/N0/Mx by EUS.   RADIOGRAPHIC STUDIES: I have personally reviewed the radiological images as listed and agreed with the findings in the report. No results  found.  ASSESSMENT & PLAN:  Carly [name] is a 77 y.o. Caucasian female with a history of hypothyroidism, GERD, Anxiety, osteopenia, Glaucoma, HTN, hypercholesterolemia, and H/o Uterine Cancer, presented with intermittent upper abdominal pain    1. 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  -She has locally advanced Adenocarcinoma of Pancreatic Head.  The EUS showed a 3.0 x 3.0 cm mass in the pancreatic head, with sonographic evidence suggesting invasion into the superior mesenteric artery, the portal vein,, the SMV, and splenic vein.  Unfortunately this is likely unresectable tumor, due  to the invasion of SMA. -Staging scan was negative for metastasis. -Her case was discussed in our GI tumor board.  Images were reviewed.  Our surgeon Dr. Barry Dienes concurs that her pancreatic cancer is not resectable. -I recommended induction chemo for 4-6 months followed by radiation if no disease progression.  We also discussed maintenance chemotherapy if she does not want to radiation. -we discussed the goal of therapy is palliative, we are not able to cure her cancer -She started FOLFIRINOX on 07/19/17. She tolerated moderately well with moderate diarrhea and fatigue.  -She appears to have allergic reaction to adhesive on her chest, this is recovering. I recommend benadryl cream to help. - She tolerated full dose cycle 2 and 3  Overall well. Continue same dose.  She has developed some mild thrombocytopenia, will continue monitoring. -Will continue to monitor her Ca19-9 for chemo response.  Her CA 19.9 has dropped significantly since she started chemotherapy, indicating good response to treatment. -I plan to scan her after cycle 5 chemo.    2. 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 She should reduce sugary fruits and high carb meets and increase vegetable.  -She was previously advised to continue to exercise as tolerable and check her Blood glucose at home in the morning and during the day. -BG has been overall not very high    3. Diarrhea, Abdominal pain and weight loss -She has previously met with our dietician Pamala Hurry Nef.  I encouraged her to take a nutritional supplement -She has been using tramadol as needed for pain. I previously prescribed hydrocodone for her, in case her pain gets worse during the trip. -I reviewed her pain medication use. She can use Tramadol for moderate pain and hydrocodone for worse pain.  -She had mild weight loss after diarrhea, I encouraged her to increase calorie and protein intake and to work to gain weight.  -She previously increased her Imodium and I prescribed Lomotil on 07/26/17 to take if imodium is not enough.  Her diarrhea is controlled.   4. H/o Uterine Cancer, Stage I, diagnosed in 1981 -She had a total hysterectomy and BSO in 1981. She had no following treatments -Followed by her Gynecologist with yearly pap smears.   5. Genetic Testing  -Given her history of uterine cancer and family history of breast cancer she is eligible for genetic testing.  -She is interested. I previously sent genetic counseling referral. She went on 08/08/2017.  -genetic test is negative   6. Social Support -She lives alone but has good familial support.  -I previously recommended that she has someone check on her weekly especially when on chemo treatment.  -I previously sent a SW referral and she has previously met with them  7. Hypokalemia  -Her potassium 2.7 today, likely related to her diarrhea yesterday -I will give her oral potassium chloride for treatment recall in the infusion room, and prescribed potassium chloride 20 mEq twice daily for 5 days, then once  daily -We will continue close monitoring   8. HTN, Hypothyroidism  -She is on HCTZ as needed and Lisinopril daily  -She is on Levothyroxine daily -I discussed with Chemo her BP may becomes low. I recommended if she develops this or dizziness I suggest she hold HCTZ first and continue to monitor her BP at home.  -Continue to Follow  up with PCP for management and medication.   9.Goal of care discussion  -We again discussed the incurable nature of her cancer, and the overall poor prognosis, especially if she does not have good response to chemotherapy or progress on chemo -The patient understands the goal of care is palliative. -she is full code now   PLAN:  -Lab reviewed, adequate for treatment, will proceed to cycle 4 FOLFIRINOX today with same dose - I refilled tramadol,  Lomotil, norco and compazine today -f/u and cycle 5 chemo in 2 weeks, please to do restaging CT 1-2 weeks after cycle 5  -KCL called in today, she will take 18mq twice daily for 5 days then once daily     No orders of the defined types were placed in this encounter.   All questions were answered. The patient knows to call the clinic with any problems, questions or concerns.  I spent 20 minutes counseling the patient face to face. The total time spent in the appointment was 25 minutes and more than 50% was on counseling.  IDierdre SearlesDweik am acting as scribe for Dr. YTruitt Merle  I have reviewed the above documentation for accuracy and completeness, and I agree with the above.      YTruitt Merle MD 08/29/2017

## 2017-08-29 ENCOUNTER — Inpatient Hospital Stay (HOSPITAL_BASED_OUTPATIENT_CLINIC_OR_DEPARTMENT_OTHER): Payer: Medicare Other | Admitting: Hematology

## 2017-08-29 ENCOUNTER — Inpatient Hospital Stay: Payer: Medicare Other

## 2017-08-29 ENCOUNTER — Telehealth: Payer: Self-pay | Admitting: Hematology

## 2017-08-29 ENCOUNTER — Inpatient Hospital Stay: Payer: Medicare Other | Attending: Hematology

## 2017-08-29 VITALS — BP 122/67 | HR 89 | Temp 98.0°F | Resp 18 | Ht 62.5 in | Wt 141.7 lb

## 2017-08-29 DIAGNOSIS — E119 Type 2 diabetes mellitus without complications: Secondary | ICD-10-CM | POA: Insufficient documentation

## 2017-08-29 DIAGNOSIS — C9002 Multiple myeloma in relapse: Secondary | ICD-10-CM

## 2017-08-29 DIAGNOSIS — R197 Diarrhea, unspecified: Secondary | ICD-10-CM | POA: Insufficient documentation

## 2017-08-29 DIAGNOSIS — E039 Hypothyroidism, unspecified: Secondary | ICD-10-CM | POA: Diagnosis not present

## 2017-08-29 DIAGNOSIS — Z5189 Encounter for other specified aftercare: Secondary | ICD-10-CM | POA: Diagnosis not present

## 2017-08-29 DIAGNOSIS — Z5111 Encounter for antineoplastic chemotherapy: Secondary | ICD-10-CM | POA: Diagnosis not present

## 2017-08-29 DIAGNOSIS — C250 Malignant neoplasm of head of pancreas: Secondary | ICD-10-CM

## 2017-08-29 DIAGNOSIS — Z452 Encounter for adjustment and management of vascular access device: Secondary | ICD-10-CM | POA: Insufficient documentation

## 2017-08-29 DIAGNOSIS — E876 Hypokalemia: Secondary | ICD-10-CM | POA: Insufficient documentation

## 2017-08-29 DIAGNOSIS — I10 Essential (primary) hypertension: Secondary | ICD-10-CM | POA: Diagnosis not present

## 2017-08-29 DIAGNOSIS — Z7189 Other specified counseling: Secondary | ICD-10-CM

## 2017-08-29 LAB — CMP (CANCER CENTER ONLY)
ALT: 19 U/L (ref 0–44)
ANION GAP: 9 (ref 5–15)
AST: 18 U/L (ref 15–41)
Albumin: 2.8 g/dL — ABNORMAL LOW (ref 3.5–5.0)
Alkaline Phosphatase: 129 U/L — ABNORMAL HIGH (ref 38–126)
BUN: 9 mg/dL (ref 8–23)
CHLORIDE: 105 mmol/L (ref 98–111)
CO2: 28 mmol/L (ref 22–32)
CREATININE: 0.74 mg/dL (ref 0.44–1.00)
Calcium: 9.2 mg/dL (ref 8.9–10.3)
Glucose, Bld: 160 mg/dL — ABNORMAL HIGH (ref 70–99)
POTASSIUM: 2.7 mmol/L — AB (ref 3.5–5.1)
SODIUM: 142 mmol/L (ref 135–145)
Total Bilirubin: 0.5 mg/dL (ref 0.3–1.2)
Total Protein: 5.5 g/dL — ABNORMAL LOW (ref 6.5–8.1)

## 2017-08-29 LAB — CBC WITH DIFFERENTIAL (CANCER CENTER ONLY)
BASOS PCT: 0 %
Basophils Absolute: 0 10*3/uL (ref 0.0–0.1)
Eosinophils Absolute: 0.1 10*3/uL (ref 0.0–0.5)
Eosinophils Relative: 1 %
HCT: 29.4 % — ABNORMAL LOW (ref 34.8–46.6)
Hemoglobin: 10.3 g/dL — ABNORMAL LOW (ref 11.6–15.9)
LYMPHS ABS: 1.5 10*3/uL (ref 0.9–3.3)
Lymphocytes Relative: 13 %
MCH: 31.8 pg (ref 25.1–34.0)
MCHC: 35 g/dL (ref 31.5–36.0)
MCV: 90.7 fL (ref 79.5–101.0)
Monocytes Absolute: 1.3 10*3/uL — ABNORMAL HIGH (ref 0.1–0.9)
Monocytes Relative: 11 %
NEUTROS ABS: 9 10*3/uL — AB (ref 1.5–6.5)
Neutrophils Relative %: 75 %
PLATELETS: 130 10*3/uL — AB (ref 145–400)
RBC: 3.24 MIL/uL — ABNORMAL LOW (ref 3.70–5.45)
RDW: 16.7 % — AB (ref 11.2–14.5)
WBC: 11.9 10*3/uL — AB (ref 3.9–10.3)

## 2017-08-29 MED ORDER — TRAMADOL HCL 50 MG PO TABS: ORAL_TABLET | ORAL | 0 refills | Status: DC

## 2017-08-29 MED ORDER — IRINOTECAN HCL CHEMO INJECTION 100 MG/5ML
150.0000 mg/m2 | Freq: Once | INTRAVENOUS | Status: AC
  Administered 2017-08-29: 260 mg via INTRAVENOUS
  Filled 2017-08-29: qty 13

## 2017-08-29 MED ORDER — PALONOSETRON HCL INJECTION 0.25 MG/5ML
0.2500 mg | Freq: Once | INTRAVENOUS | Status: AC
  Administered 2017-08-29: 0.25 mg via INTRAVENOUS

## 2017-08-29 MED ORDER — POTASSIUM CHLORIDE CRYS ER 20 MEQ PO TBCR
40.0000 meq | EXTENDED_RELEASE_TABLET | Freq: Once | ORAL | Status: AC
  Administered 2017-08-29: 40 meq via ORAL

## 2017-08-29 MED ORDER — ATROPINE SULFATE 1 MG/ML IJ SOLN
INTRAMUSCULAR | Status: AC
  Filled 2017-08-29: qty 1

## 2017-08-29 MED ORDER — HYDROCODONE-ACETAMINOPHEN 5-325 MG PO TABS: 1.0000 | ORAL_TABLET | Freq: Four times a day (QID) | ORAL | 0 refills | Status: AC | PRN

## 2017-08-29 MED ORDER — DEXTROSE 5 % IV SOLN
Freq: Once | INTRAVENOUS | Status: AC
  Administered 2017-08-29: 10:00:00 via INTRAVENOUS

## 2017-08-29 MED ORDER — LEUCOVORIN CALCIUM INJECTION 350 MG
400.0000 mg/m2 | Freq: Once | INTRAVENOUS | Status: AC
  Administered 2017-08-29: 680 mg via INTRAVENOUS
  Filled 2017-08-29: qty 34

## 2017-08-29 MED ORDER — DIPHENOXYLATE-ATROPINE 2.5-0.025 MG PO TABS: 1.0000 | ORAL_TABLET | Freq: Four times a day (QID) | ORAL | 1 refills | Status: DC | PRN

## 2017-08-29 MED ORDER — DEXAMETHASONE SODIUM PHOSPHATE 10 MG/ML IJ SOLN
10.0000 mg | Freq: Once | INTRAMUSCULAR | Status: AC
  Administered 2017-08-29: 10 mg via INTRAVENOUS

## 2017-08-29 MED ORDER — POTASSIUM CHLORIDE CRYS ER 20 MEQ PO TBCR: 20.0000 meq | EXTENDED_RELEASE_TABLET | Freq: Two times a day (BID) | ORAL | 1 refills | Status: DC

## 2017-08-29 MED ORDER — PALONOSETRON HCL INJECTION 0.25 MG/5ML
INTRAVENOUS | Status: AC
  Filled 2017-08-29: qty 5

## 2017-08-29 MED ORDER — PROCHLORPERAZINE MALEATE 10 MG PO TABS: 10.0000 mg | ORAL_TABLET | Freq: Four times a day (QID) | ORAL | 2 refills | Status: DC | PRN

## 2017-08-29 MED ORDER — SODIUM CHLORIDE 0.9 % IV SOLN
2400.0000 mg/m2 | INTRAVENOUS | Status: DC
  Administered 2017-08-29: 4100 mg via INTRAVENOUS
  Filled 2017-08-29: qty 82

## 2017-08-29 MED ORDER — POTASSIUM CHLORIDE CRYS ER 20 MEQ PO TBCR
EXTENDED_RELEASE_TABLET | ORAL | Status: AC
  Filled 2017-08-29: qty 2

## 2017-08-29 MED ORDER — DEXAMETHASONE SODIUM PHOSPHATE 10 MG/ML IJ SOLN
INTRAMUSCULAR | Status: AC
  Filled 2017-08-29: qty 1

## 2017-08-29 MED ORDER — OXALIPLATIN CHEMO INJECTION 100 MG/20ML
87.0000 mg/m2 | Freq: Once | INTRAVENOUS | Status: AC
  Administered 2017-08-29: 150 mg via INTRAVENOUS
  Filled 2017-08-29: qty 20

## 2017-08-29 MED ORDER — ONDANSETRON HCL 8 MG PO TABS: 8.0000 mg | ORAL_TABLET | Freq: Two times a day (BID) | ORAL | 1 refills | Status: DC | PRN

## 2017-08-29 MED ORDER — ATROPINE SULFATE 1 MG/ML IJ SOLN
0.5000 mg | Freq: Once | INTRAMUSCULAR | Status: AC | PRN
  Administered 2017-08-29: 0.5 mg via INTRAVENOUS

## 2017-08-29 NOTE — Patient Instructions (Signed)
[facility] Discharge Instructions for Patients Receiving Chemotherapy  Today you received the following chemotherapy agents Oxaliplatin, Leucovorin, Irinotecan and 5FU  To help prevent nausea and vomiting after your treatment, we encourage you to take your nausea medication as directed If you develop nausea and vomiting that is not controlled by your nausea medication, call the clinic.   BELOW ARE SYMPTOMS THAT SHOULD BE REPORTED IMMEDIATELY:  *FEVER GREATER THAN 100.5 F  *CHILLS WITH OR WITHOUT FEVER  NAUSEA AND VOMITING THAT IS NOT CONTROLLED WITH YOUR NAUSEA MEDICATION  *UNUSUAL SHORTNESS OF BREATH  *UNUSUAL BRUISING OR BLEEDING  TENDERNESS IN MOUTH AND THROAT WITH OR WITHOUT PRESENCE OF ULCERS  *URINARY PROBLEMS  *BOWEL PROBLEMS  UNUSUAL RASH Items with * indicate a potential emergency and should be followed up as soon as possible.  Feel free to call the clinic should you have any questions or concerns. The clinic phone number is [phone].  Please show the CHEMO ALERT CARD at check-in to the Emergency Department and triage nurse.   

## 2017-08-29 NOTE — Progress Notes (Signed)
Per Dr. Burr Medico okay to treat pt with potassium level of 2.7.  Pt will receive 40 mEq P.O. Potassium today in infusion room.  Dr Burr Medico will also call in a script for potassium to pts pharmacy.  Pt educated and states understanding.

## 2017-08-29 NOTE — Telephone Encounter (Signed)
CT CAP W contrast in 3-4 weeks / contrast material provided/ AVS/Calendar printed per 7/3 los

## 2017-08-29 NOTE — Telephone Encounter (Signed)
Appointments scheduled AVS/Calendar printed per 7/3 los °

## 2017-08-30 ENCOUNTER — Encounter: Payer: Self-pay | Admitting: Hematology

## 2017-08-31 ENCOUNTER — Inpatient Hospital Stay: Payer: Medicare Other

## 2017-08-31 VITALS — BP 137/74 | HR 95 | Temp 98.1°F | Resp 18

## 2017-08-31 DIAGNOSIS — C250 Malignant neoplasm of head of pancreas: Secondary | ICD-10-CM

## 2017-08-31 DIAGNOSIS — Z452 Encounter for adjustment and management of vascular access device: Secondary | ICD-10-CM | POA: Diagnosis not present

## 2017-08-31 DIAGNOSIS — Z7189 Other specified counseling: Secondary | ICD-10-CM

## 2017-08-31 DIAGNOSIS — R197 Diarrhea, unspecified: Secondary | ICD-10-CM | POA: Diagnosis not present

## 2017-08-31 DIAGNOSIS — E119 Type 2 diabetes mellitus without complications: Secondary | ICD-10-CM | POA: Diagnosis not present

## 2017-08-31 DIAGNOSIS — Z5189 Encounter for other specified aftercare: Secondary | ICD-10-CM | POA: Diagnosis not present

## 2017-08-31 DIAGNOSIS — Z5111 Encounter for antineoplastic chemotherapy: Secondary | ICD-10-CM | POA: Diagnosis not present

## 2017-08-31 MED ORDER — PEGFILGRASTIM INJECTION 6 MG/0.6ML ~~LOC~~
PREFILLED_SYRINGE | SUBCUTANEOUS | Status: AC
  Filled 2017-08-31: qty 0.6

## 2017-08-31 MED ORDER — HEPARIN SOD (PORK) LOCK FLUSH 100 UNIT/ML IV SOLN
500.0000 [IU] | Freq: Once | INTRAVENOUS | Status: AC | PRN
  Administered 2017-08-31: 500 [IU]
  Filled 2017-08-31: qty 5

## 2017-08-31 MED ORDER — SODIUM CHLORIDE 0.9% FLUSH
10.0000 mL | INTRAVENOUS | Status: DC | PRN
  Administered 2017-08-31: 10 mL
  Filled 2017-08-31: qty 10

## 2017-08-31 MED ORDER — PEGFILGRASTIM INJECTION 6 MG/0.6ML ~~LOC~~
6.0000 mg | PREFILLED_SYRINGE | Freq: Once | SUBCUTANEOUS | Status: AC
  Administered 2017-08-31: 6 mg via SUBCUTANEOUS

## 2017-09-10 NOTE — Progress Notes (Signed)
[place]  [phone] [phone]) [phone]  Clinic Follow Up Note   Patient Care Team: Darcus Austin, MD as PCP - General (Family Medicine) Arta Silence, MD as Consulting Physician (Gastroenterology) Truitt Merle, MD as Consulting Physician (Hematology).  Date of Service:  09/13/2017  CHIEF COMPLAINTS:  F/u for Pancreatic Cancer, stage III   Oncology History   Cancer Staging Pancreatic cancer [facility]) Staging form: Exocrine Pancreas, AJCC 8th Edition - Clinical stage from 06/13/2017: Stage III (cT4, cN0, cM0) - Signed by Truitt Merle, MD on 06/13/2017       Pancreatic cancer ([place])   06/13/2017 Initial Diagnosis    Pancreatic cancer ([place])      06/13/2017 Cancer Staging    Staging form: Exocrine Pancreas, AJCC 8th Edition - Clinical stage from 06/13/2017: Stage III (cT4, cN0, cM0) - Signed by Truitt Merle, MD on 06/13/2017      06/13/2017 Procedure    EUS by Dr. Paulita Fujita 06/13/17  IMPRESSION:  -There was no sign of significant pathology in the ampulla. - There was no evidence of significant pathology in the left lobe of the liver. - A mass was identified in the pancreatic head. Abutment SMV; invasion portal confluence; no adenopathy. Fine needle aspiration performed. If this is a pancreatic adenocarcinoma, it would be staged T4/N0/Mx by EUS.      06/13/2017 Initial Biopsy    Diagnosis 06/13/17 FINE NEEDLE ASPIRATION, ENDOSCOPIC, PANCREAS HEAD (SPECIMEN 1 OF 1 COLLECTED 06/13/17): ADENOCARCINOMA. Preliminary Diagnosis Intraoperative Diagnosis: 1-3) ATYPICAL CELLS, ADDITIONAL MATERIAL REQUESTED. (NDK) 4-6 ADENOCARCINOMA. (NDK) Reported to Dr. Paulita Fujita on 06/13/17 @ 11:07am.       06/21/2017 Imaging    CT Chest without contrast IMPRESSION: 1. No findings suspicious for metastatic disease within the chest.  2. Tiny subpleural right lower lobe pulmonary nodule is nonspecific, but likely benign. This can be addressed on follow-up imaging.  3. Aortic  Atherosclerosis (ICD10-I70.0).      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  The Hereditary Gene Panel offered by [facility] includes sequencing and/or deletion duplication testing of the following 47 genes: APC, ATM, AXIN2, BARD1, BMPR1A, BRCA1, BRCA2, BRIP1, CDH1, CDK4, CDKN2A (p14ARF), CDKN2A (p16INK4a), CHEK2, CTNNA1, DICER1, EPCAM (Deletion/duplication testing only), GREM1 (promoter region deletion/duplication testing only), KIT, MEN1, MLH1, MSH2, MSH3, MSH6, MUTYH, NBN, NF1, NHTL1, PALB2, PDGFRA, PMS2, POLD1, POLE, PTEN, RAD50, RAD51C, RAD51D, SDHB, SDHC, SDHD, SMAD4, SMARCA4. STK11, TP53, TSC1, TSC2, and VHL.  The following genes were evaluated for sequence changes only: SDHA and HOXB13 c.251G>A variant only. The report date is August 14, 2017.        HISTORY OF PRESENTING ILLNESS: 06/14/17 Carly [name] 77 y.o. female is a here because of newly diagnosed Pancreatic Cancer. The patient was referred by Dr. Paulita Fujita. The patient presents to the clinic today accompanied by her 2 sons and her daughter-in-law.   She was having aching of her left abdominal starting 3 months ago after UTI. This pain comes and goes, which worsened at night. She denies issues with her appetite overall but she does not eat as much as she did due to the recent death of her husband. She denies any abdominal bloating. The color of urine has not become dark.   Today the patient notes she still has left abdominal pain. She notes she plans to see her grandson in  [place] this weekend. She plans to go to the beach for 1-2 weeks in May, 2018. She prefers to be reached by her home phone and then her daughter-in-law.  Socially she lives by herself since her husband's passing in 12/2016. Her house is 1 level. She has support from her son and his wife. She has good family support. She has a  stationary bike he rides 30 minutes a day and keeps track of her steps per day. She is very active and goes out often. 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   In the past the patient had a complete hysterectomy in early 1981 for bleeding. During her surgery she had cancer cells found and had a BSO. She had no following treatments. She had a pelvic rebuilt in 1990s due to prolapse. She had a yearly pap smear. She is diagnosed os type II DM but is not on any medication at this time.  She takes tramadol for the pain as needed, trazodone to help her sleep and takes XANAX for her anxiety. She has been on this for years. She has been on Trazodone for 40 years to also help her anxiety. She takes Lipitor for her high cholesterol, not regularly. She takes HCTZ for her fluid retention which she takes as needed. She takes Lisinopril for her HTN. She takes Synthroid for her hypothyroidism.  Her maternal grandmother had breast cancer. Her maternal cousin had breast cancer and her paternal cousin had a blood disorder. She smoked for 10-15 years of 1/2-1 pack a day before stopping in 1980s.   On review of symptoms, pt notes left abdominal ache which she tried tylenol and she was recently given Tramadol. This helped her some. She denies abdominal bloating, dark urine or jaundice and denies dysuria.  CURRENT THERAPY: chemo FOLFIRINOX every 2 weeks started 07/19/17. Given her thrombocytopenia I reduce her dose by 10-15% starting with cycle 5.   INTERVAL HISTORY:   Carly [name] is here for follow-up and Cycle 5 FOLFIRINOX. She presents to the clinic today accompanied by her family member.  She notes the only issues she has from chemo are bouts of diarrhea for which she takes lomotil about 4 pills daily. This diarrhea is intermittent with no cramps or pain or blood in stool. She notes her stool is more orange. She notes her appetite and eating is fair. Her weight is trending  down. She notes to drinking premier supplements. She notes having mouth sores last week but they have resolved. She notes left leg swelling, mildly.  She would like to take off for beach trip in September 10-17 so she would skip her 9/11 treatment.    MEDICAL HISTORY:  Past Medical History:  Diagnosis Date  . Allergic rhinitis    Skin Test 04-14-2009  . Asthma   . Cancer ([place]) 1981   adenocarcinoma of uterus  . Diabetes mellitus without complication ([place])    type II, no meds per pt  . Family history of breast cancer   . History of uterine cancer   . Hyperlipemia   . Hypertension   . Insomnia     SURGICAL HISTORY: Past Surgical History:  Procedure Laterality Date  . ABDOMINAL HYSTERECTOMY    . CARPAL TUNNEL RELEASE    . EUS N/A 06/13/2017   Procedure: FULL UPPER ENDOSCOPIC ULTRASOUND (EUS) RADIAL;  Surgeon: Arta Silence, MD;  Location: [facility];  Service: Endoscopy;  Laterality: N/A;  . I&D EXTREMITY  09/17/2011   Procedure: IRRIGATION AND DEBRIDEMENT EXTREMITY;  Surgeon: Roseanne Kaufman, MD;  Location: [facility];  Service: Orthopedics;  Laterality: Left;  with drain placement  . IR FLUORO GUIDE PORT INSERTION RIGHT  07/18/2017  . IR US GUIDE VASC ACCESS RIGHT  07/18/2017  . NASAL SEPTOPLASTY W/ TURBINOPLASTY    . pelvic floor rebuild    . TONSILLECTOMY    . TOTAL ABDOMINAL HYSTERECTOMY W/ BILATERAL SALPINGOOPHORECTOMY      SOCIAL HISTORY: Social History   Socioeconomic History  . Marital status: Widowed    Spouse name: Not on file  . Number of children: Not on file  . Years of education: Not on file  . Highest education level: Not on file  Occupational History  . Not on file  Social Needs  . Financial resource strain: Not on file  . Food insecurity:    Worry: Not on file    Inability: Not on file  . Transportation needs:    Medical: Not on file    Non-medical: Not on file  Tobacco Use  . Smoking status: Former Smoker    Packs/day: 0.50    Years: 15.00    Pack  years: 7.50    Types: Cigarettes    Last attempt to quit: 02/27/1978    Years since quitting: 39.5  . Smokeless tobacco: Never Used  Substance and Sexual Activity  . Alcohol use: Yes    Alcohol/week: 0.0 oz    Comment: occ- glass of wine  . Drug use: No  . Sexual activity: Yes    Comment: 1st intercouse- 15, partners- 1  Lifestyle  . Physical activity:    Days per week: Not on file    Minutes per session: Not on file  . Stress: Not on file  Relationships  . Social connections:    Talks on phone: Not on file    Gets together: Not on file    Attends religious service: Not on file    Active member of club or organization: Not on file    Attends meetings of clubs or organizations: Not on file    Relationship status: Not on file  . Intimate partner violence:    Fear of current or ex partner: Not on file    Emotionally abused: Not on file    Physically abused: Not on file    Forced sexual activity: Not on file  Other Topics Concern  . Not on file  Social History Narrative  . Not on file    FAMILY HISTORY: Family History  Problem Relation Age of Onset  . Stroke Mother 77  . Heart attack Father 67  . Dementia Brother   . Hypertension Brother   . Skin cancer Brother   . Breast cancer Maternal Grandmother        dx >50  . Breast cancer Cousin        mat first cousin, dx in her 58s  . Cancer Cousin        oral cancer  . Breast cancer Cousin        mat first cousin, dx in her 46s  . Heart disease Maternal Uncle   . Other Maternal Grandfather        suicide  . Heart disease Paternal Grandmother     ALLERGIES:  is allergic to cephalexin and nabumetone.  MEDICATIONS:  Current Outpatient Medications  Medication Sig Dispense Refill  . ALPRAZolam (XANAX) 0.5 MG tablet Take 1 tablet (0.5 mg total) by mouth at  bedtime as needed for anxiety. 90 tablet 0  . Ascorbic Acid (VITAMIN C) 500 MG PACK Take 100 mg by mouth daily.    Marland Kitchen atorvastatin (LIPITOR) 10 MG tablet Take 10 mg by  mouth at bedtime.     Marland Kitchen CALCIUM PO Take 1 tablet by mouth daily.     . Cholecalciferol (VITAMIN D) 2000 units CAPS Take by mouth.    . Cyanocobalamin (VITAMIN B-12 PO) Take 1 tablet by mouth daily.     . diphenoxylate-atropine (LOMOTIL) 2.5-0.025 MG tablet Take 1-2 tablets by mouth 4 (four) times daily as needed for diarrhea or loose stools. 40 tablet 1  . hydrochlorothiazide (HYDRODIURIL) 25 MG tablet Take 1 tablet by mouth daily as needed.     Marland Kitchen HYDROcodone-acetaminophen (NORCO) 5-325 MG tablet Take 1 tablet by mouth every 6 (six) hours as needed for moderate pain. 40 tablet 0  . hyoscyamine (LEVBID) 0.375 MG 12 hr tablet Take 0.375 mg by mouth every 12 (twelve) hours as needed for cramping.     . latanoprost (XALATAN) 0.005 % ophthalmic solution 1 drop at bedtime.    Marland Kitchen levothyroxine (SYNTHROID, LEVOTHROID) 25 MCG tablet Take 25 mcg by mouth daily before breakfast.     . lidocaine-prilocaine (EMLA) cream Apply to affected area once 30 g 3  . lisinopril (PRINIVIL,ZESTRIL) 10 MG tablet Take 10 mg by mouth at bedtime.    . magic mouthwash w/lidocaine SOLN Take 5 mLs by mouth 3 (three) times daily as needed for mouth pain. 240 mL 0  . nitrofurantoin, macrocrystal-monohydrate, (MACROBID) 100 MG capsule Take 1 capsule (100 mg total) by mouth 2 (two) times daily. 10 capsule 0  . Omega-3 Fatty Acids (FISH OIL PO) Take 1 capsule by mouth daily.     Marland Kitchen omeprazole (PRILOSEC) 20 MG capsule Take 1 tablet by mouth daily.    . ondansetron (ZOFRAN) 8 MG tablet Take 1 tablet (8 mg total) by mouth 2 (two) times daily as needed for refractory nausea / vomiting. Start on day 3 after chemotherapy. 30 tablet 1  . ONETOUCH DELICA LANCETS 52D MISC USE TO TEST YOUR BLOOD SUGAR ONCE A DAY FASTING AND 2 HRS AFTER A MEAL AS NEEDED  3  . ONETOUCH VERIO test strip USE TO TEST YOUR BLOOD SUGAR ONCE A DAY FASTING & 2 HRS AFTER A MEAL AS NEEDED  3  . prochlorperazine (COMPAZINE) 10 MG tablet Take 1 tablet (10 mg total) by mouth  every 6 (six) hours as needed (NAUSEA). 30 tablet 2  . traMADol (ULTRAM) 50 MG tablet Take 1-2 tablets every 6 hours as needed for pain 180 tablet 0  . traZODone (DESYREL) 100 MG tablet Take 1 tablet (100 mg total) by mouth at bedtime. (Patient taking differently: Take 50 mg by mouth at bedtime. ) 90 tablet 3  . potassium chloride SA (K-DUR,KLOR-CON) 20 MEQ tablet Take 1 tablet (20 mEq total) by mouth 2 (two) times daily. 60 tablet 1   No current facility-administered medications for this visit.    Facility-Administered Medications Ordered in Other Visits  Medication Dose Route Frequency Provider Last Rate Last Dose  . atropine injection 0.5 mg  0.5 mg Intravenous Once PRN Truitt Merle, MD      . fluorouracil (ADRUCIL) 3,750 mg in sodium chloride 0.9 % 75 mL chemo infusion  2,200 mg/m2 (Treatment Plan Recorded) Intravenous 1 day or 1 dose Truitt Merle, MD      . irinotecan (CAMPTOSAR) 260 mg in dextrose 5 % 500 mL chemo  infusion  150 mg/m2 (Treatment Plan Recorded) Intravenous Once Truitt Merle, MD      . leucovorin 680 mg in dextrose 5 % 250 mL infusion  400 mg/m2 (Treatment Plan Recorded) Intravenous Once Truitt Merle, MD      . oxaliplatin (ELOXATIN) 120 mg in dextrose 5 % 500 mL chemo infusion  70 mg/m2 (Treatment Plan Recorded) Intravenous Once Truitt Merle, MD        REVIEW OF SYSTEMS:  Constitutional: Denies fevers, chills or abnormal night sweats (+) weight trending down (+) fair appetite Eyes: Denies double vision or watery eyes, blurred vision Ears, nose, mouth, throat, and face: Denies mucositis or sore throat Respiratory: Denies cough, dyspnea or wheezes Cardiovascular: Denies palpitation, chest discomfort (+) mild lower extremity swelling of right leg Gastrointestinal:  Denies heartburn (+) intermittent diarrhea   Urinary: Denies frequency, urgency, burning, or incontinence.  Skin: Denies abnormal skin rashes (+) complete hair loss Lymphatics: Denies new lymphadenopathy or easy  bruising Neurological:Denies numbness, tingling or new weaknesses MSK: No myalgia Behavioral/Psych: Mood is stable, no new changes All other systems were reviewed with the patient and are negative.  PHYSICAL EXAMINATION: ECOG PERFORMANCE STATUS: 1 - Symptomatic but completely ambulatory  Vitals:   09/13/17 0920  BP: (!) 120/58  Pulse: 81  Resp: 18  Temp: 98.7 F (37.1 C)  SpO2: 98%   Filed Weights   09/13/17 0920  Weight: 139 lb 9.6 oz (63.3 kg)    GENERAL:alert, no distress and comfortable (+) hair loss (+) weight loss SKIN: skin color, texture, turgor are normal, no rashes or significant lesions EYES: normal, conjunctiva are pink and non-injected, sclera clear OROPHARYNX:no exudate, no erythema and lips, buccal mucosa, and tongue normal  NECK: supple, thyroid normal size, non-tender, without nodularity LYMPH:  no palpable lymphadenopathy in the cervical, axillary or inguinal LUNGS: clear to auscultation and percussion with normal breathing effort HEART: regular rhythm and no murmurs and no lower extremity edema  ABDOMEN:abdomen soft, non-tender and normal bowel sounds Musculoskeletal:no cyanosis of digits and no clubbing  PSYCH: alert & oriented x 3 with fluent speech NEURO: no focal motor/sensory deficits  LABORATORY DATA:  I have reviewed the data as listed CBC Latest Ref Rng & Units 09/13/2017 08/29/2017 08/16/2017  WBC 3.9 - 10.3 K/uL 13.1(H) 11.9(H) 13.7(H)  Hemoglobin 11.6 - 15.9 g/dL 9.7(L) 10.3(L) 11.2(L)  Hematocrit 34.8 - 46.6 % 28.7(L) 29.4(L) 32.1(L)  Platelets 145 - 400 K/uL 93(L) 130(L) 133(L)    CMP Latest Ref Rng & Units 09/13/2017 08/29/2017 08/16/2017  Glucose 70 - 99 mg/dL 144(H) 160(H) 143(H)  BUN 8 - 23 mg/dL _0 Creatinine 0.44 - 1.00 mg/dL 0.71 0.74 0.75  Sodium 135 - 145 mmol/L 141 142 141  Potassium 3.5 - 5.1 mmol/L 3.0(LL) 2.7(LL) 3.5  Chloride 98 - 111 mmol/L 107 105 107  CO2 22 - 32 mmol/L _1 Calcium 8.9 - 10.3 mg/dL 9.1 9.2 9.2   Total Protein 6.5 - 8.1 g/dL 5.2(L) 5.5(L) 5.9(L)  Total Bilirubin 0.3 - 1.2 mg/dL 0.5 0.5 0.5  Alkaline Phos 38 - 126 U/L 126 129(H) 119  AST 15 - 41 U/L _2 ALT 0 - 44 U/L _3 CA 19-9  07/19/17: 1436 08/16/17 606  09/13/17: PENDING    PATHOLOGY Diagnosis 06/13/17 FINE NEEDLE ASPIRATION, ENDOSCOPIC, PANCREAS HEAD (SPECIMEN 1 OF 1 COLLECTED 06/13/17): ADENOCARCINOMA. Preliminary Diagnosis Intraoperative Diagnosis: 1-3) ATYPICAL CELLS, ADDITIONAL MATERIAL REQUESTED. (NDK) 4-6 ADENOCARCINOMA. (NDK) Reported  to Dr. Paulita Fujita on 06/13/17 @ 11:07am.    PROCEDURES  EUS by Dr. Paulita Fujita 06/13/17  IMPRESSION:  -There was no sign of significant pathology in the ampulla. - There was no evidence of significant pathology in the left lobe of the liver. - A mass was identified in the pancreatic head. [place] SMV; invasion portal confluence; no adenopathy. Fine needle aspiration performed. If this is a pancreatic adenocarcinoma, it would be staged T4/N0/Mx by EUS.   RADIOGRAPHIC STUDIES: I have personally reviewed the radiological images as listed and agreed with the findings in the report. No results found.  ASSESSMENT & PLAN:  Carly [name] is a 77 y.o. Caucasian female with a history of hypothyroidism, GERD, Anxiety, osteopenia, Glaucoma, HTN, hypercholesterolemia, and H/o Uterine Cancer, presented with intermittent upper abdominal pain    1. 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  -She has locally advanced Adenocarcinoma of Pancreatic Head.  The EUS showed a 3.0 x 3.0 cm mass in the pancreatic head, with sonographic evidence suggesting invasion into the superior mesenteric artery, the portal vein,, the SMV, and splenic vein.  Unfortunately this is likely unresectable tumor, due to the invasion of SMA. -Staging scan was negative  for metastasis. -Her case was discussed in our GI tumor board. Our surgeon Dr. Barry Dienes concurs that her pancreatic cancer is not resectable. -I previously recommended induction chemo for 4-6 months followed by radiation if no disease progression. We also discussed maintenance chemotherapy if she does not want to radiation. -The goal of therapy is palliative, we are not able to cure her cancer -She started FOLFIRINOX q2weeks on 07/19/17. She tolerated moderately well with moderate diarrhea and fatigue.  -She tolerated full dose cycle 2 and 3  Overall well. Continue same dose. She has developed some mild thrombocytopenia, will continue monitoring. -Labs reviewed, her Ca 19.9 is reducing which is an indication of good response. Her WBC at 13.1, hg at 9.8, plt at 93K. Given her thrombocytopenia I will reduce her dose by 10-15% today.  -She is tolerating treatment well overall, her pain has improved, her tumor marker CA 19.9 has decreased significantly, indicating likely good response to treatment. -I plan to repeat CT scan on 7/30. Will further discuss her options of surgery or radiation based on scan results.  -I encouraged her to eat more and increase her nutritional supplements and watch for worsened leg swelling or pain as chemo can increase her risk for blood clots.  -Plan to skip 9/11 treatment for beach trip. I will request to change her treatments to Thursdays 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 She should reduce sugary fruits and high carb meets and increase vegetable.  -She was previously advised to continue to exercise as tolerable and check her Blood glucose at home in the morning and during the day. -BG has been overall not very high, at 144 today (09/13/17)   3. Diarrhea, Abdominal pain and  weight loss -She has previously met with our dietician Pamala Hurry Nef. I previously encouraged her to take a nutritional supplement -I previously reviewed her pain medication use. She can use Tramadol for moderate pain and hydrocodone for worse pain.  -  She previously increased her Imodium and I prescribed Lomotil on 07/26/17 to take if imodium is not enough.   -Her intermittent diarrhea controlled with lomotil. Her pain is mild and nearly resolved. Weight is trending down, I encouraged her to increase premier supplements.    4. H/o Uterine Cancer, Stage I, diagnosed in 1981 -She had a total hysterectomy and BSO in 1981. She had no following treatments -Followed by her Gynecologist with yearly pap smears.   5. Genetic Testing  -Given her history of uterine cancer and family history of breast cancer she is eligible for genetic testing. She expressed interest and. I previously sent genetic counseling referral. -genetic test was negative   6. Social Support -She lives alone but has good familial support.  -I previously recommended that she has someone check on her weekly especially when on chemo treatment.  -I previously sent a SW referral and she has previously met with them  7. Hypokalemia   - likely related to her diarrhea  -I previously prescribed potassium chloride 20 mEq twice daily for 5 days, then to continue once daily -We will continue close monitoring -K at 3.0 today (09/13/17), I will give her her potassium 20 mEq in infusion room, and she will increase to 20 MEQ twice daily at home   8. HTN, Hypothyroidism  -She is on HCTZ as needed and Lisinopril daily  -She is on Levothyroxine daily -I discussed with Chemo her BP may becomes low. I recommended if she develops this or dizziness I suggest she hold HCTZ first and continue to monitor her BP at home.  -Continue to Follow up with PCP for management and medication.  -BP at 120/58 today (09/13/17)  9.Goal of care discussion  -We  again discussed the incurable nature of her cancer, and the overall poor prognosis, especially if she does not have good response to chemotherapy or progress on chemo -The patient understands the goal of care is palliative. -she is full code now   PLAN:  -Lab reviewed, adequate for treatment, will proceed to cycle 5 FOLFIRINOX today with dose reduced of oxaliplatin and 5-FU  -Lab, flush, f/u and FOLFIRINOX in 2 weeks  -Cancel 9/11 and 9/13 appointments  -Please postpone her appointment on 8/28, 8/30, 9/25 and 9/27 for one day  -Schedule her CT CAP with contrast for 7/30   No orders of the defined types were placed in this encounter.   All questions were answered. The patient knows to call the clinic with any problems, questions or concerns.  I spent 20 minutes counseling the patient face to face. The total time spent in the appointment was 25 minutes and more than 50% was on counseling.  Oneal Deputy, am acting as scribe for Truitt Merle, MD.   I have reviewed the above documentation for accuracy and completeness, and I agree with the above.      Truitt Merle, MD 09/13/2017

## 2017-09-13 ENCOUNTER — Inpatient Hospital Stay (HOSPITAL_BASED_OUTPATIENT_CLINIC_OR_DEPARTMENT_OTHER): Payer: Medicare Other | Admitting: Hematology

## 2017-09-13 ENCOUNTER — Telehealth: Payer: Self-pay | Admitting: Hematology

## 2017-09-13 ENCOUNTER — Encounter: Payer: Self-pay | Admitting: Hematology

## 2017-09-13 ENCOUNTER — Telehealth: Payer: Self-pay

## 2017-09-13 ENCOUNTER — Inpatient Hospital Stay: Payer: Medicare Other

## 2017-09-13 ENCOUNTER — Other Ambulatory Visit: Payer: Self-pay | Admitting: *Deleted

## 2017-09-13 VITALS — BP 120/58 | HR 81 | Temp 98.7°F | Resp 18 | Ht 62.5 in | Wt 139.6 lb

## 2017-09-13 DIAGNOSIS — I10 Essential (primary) hypertension: Secondary | ICD-10-CM

## 2017-09-13 DIAGNOSIS — R197 Diarrhea, unspecified: Secondary | ICD-10-CM | POA: Diagnosis not present

## 2017-09-13 DIAGNOSIS — Z7189 Other specified counseling: Secondary | ICD-10-CM

## 2017-09-13 DIAGNOSIS — E876 Hypokalemia: Secondary | ICD-10-CM | POA: Diagnosis not present

## 2017-09-13 DIAGNOSIS — C250 Malignant neoplasm of head of pancreas: Secondary | ICD-10-CM

## 2017-09-13 DIAGNOSIS — Z95828 Presence of other vascular implants and grafts: Secondary | ICD-10-CM

## 2017-09-13 DIAGNOSIS — Z5189 Encounter for other specified aftercare: Secondary | ICD-10-CM | POA: Diagnosis not present

## 2017-09-13 DIAGNOSIS — E039 Hypothyroidism, unspecified: Secondary | ICD-10-CM | POA: Diagnosis not present

## 2017-09-13 DIAGNOSIS — E119 Type 2 diabetes mellitus without complications: Secondary | ICD-10-CM

## 2017-09-13 DIAGNOSIS — Z452 Encounter for adjustment and management of vascular access device: Secondary | ICD-10-CM | POA: Diagnosis not present

## 2017-09-13 DIAGNOSIS — Z5111 Encounter for antineoplastic chemotherapy: Secondary | ICD-10-CM | POA: Diagnosis not present

## 2017-09-13 LAB — CBC WITH DIFFERENTIAL (CANCER CENTER ONLY)
BASOS ABS: 0 10*3/uL (ref 0.0–0.1)
BASOS PCT: 0 %
EOS PCT: 2 %
Eosinophils Absolute: 0.2 10*3/uL (ref 0.0–0.5)
HEMATOCRIT: 28.7 % — AB (ref 34.8–46.6)
Hemoglobin: 9.7 g/dL — ABNORMAL LOW (ref 11.6–15.9)
Lymphocytes Relative: 13 %
Lymphs Abs: 1.6 10*3/uL (ref 0.9–3.3)
MCH: 31.5 pg (ref 25.1–34.0)
MCHC: 33.8 g/dL (ref 31.5–36.0)
MCV: 93.2 fL (ref 79.5–101.0)
MONO ABS: 1.1 10*3/uL — AB (ref 0.1–0.9)
Monocytes Relative: 8 %
NEUTROS ABS: 10.1 10*3/uL — AB (ref 1.5–6.5)
Neutrophils Relative %: 77 %
PLATELETS: 93 10*3/uL — AB (ref 145–400)
RBC: 3.08 MIL/uL — ABNORMAL LOW (ref 3.70–5.45)
RDW: 18.9 % — AB (ref 11.2–14.5)
WBC Count: 13.1 10*3/uL — ABNORMAL HIGH (ref 3.9–10.3)

## 2017-09-13 LAB — CMP (CANCER CENTER ONLY)
ALBUMIN: 2.8 g/dL — AB (ref 3.5–5.0)
ALK PHOS: 126 U/L (ref 38–126)
ALT: 23 U/L (ref 0–44)
ANION GAP: 7 (ref 5–15)
AST: 24 U/L (ref 15–41)
BUN: 15 mg/dL (ref 8–23)
CALCIUM: 9.1 mg/dL (ref 8.9–10.3)
CO2: 27 mmol/L (ref 22–32)
Chloride: 107 mmol/L (ref 98–111)
Creatinine: 0.71 mg/dL (ref 0.44–1.00)
GFR, Est AFR Am: >60 mL/min (ref >60)
GFR, Estimated: >60 mL/min (ref >60)
GLUCOSE: 144 mg/dL — AB (ref 70–99)
Potassium: 3 mmol/L — CL (ref 3.5–5.1)
SODIUM: 141 mmol/L (ref 135–145)
Total Bilirubin: 0.5 mg/dL (ref 0.3–1.2)
Total Protein: 5.2 g/dL — ABNORMAL LOW (ref 6.5–8.1)

## 2017-09-13 MED ORDER — PALONOSETRON HCL INJECTION 0.25 MG/5ML
INTRAVENOUS | Status: AC
Start: 2017-09-13 — End: ?
  Filled 2017-09-13: qty 5

## 2017-09-13 MED ORDER — POTASSIUM CHLORIDE CRYS ER 20 MEQ PO TBCR
20.0000 meq | EXTENDED_RELEASE_TABLET | Freq: Once | ORAL | Status: AC
  Administered 2017-09-13: 20 meq via ORAL

## 2017-09-13 MED ORDER — POTASSIUM CHLORIDE CRYS ER 20 MEQ PO TBCR: 20.0000 meq | EXTENDED_RELEASE_TABLET | Freq: Two times a day (BID) | ORAL | 1 refills | Status: DC

## 2017-09-13 MED ORDER — DEXTROSE 5 % IV SOLN
Freq: Once | INTRAVENOUS | Status: AC
  Administered 2017-09-13: 10:00:00 via INTRAVENOUS

## 2017-09-13 MED ORDER — DEXAMETHASONE SODIUM PHOSPHATE 10 MG/ML IJ SOLN
INTRAMUSCULAR | Status: AC
  Filled 2017-09-13: qty 1

## 2017-09-13 MED ORDER — FAMOTIDINE IN NACL 20-0.9 MG/50ML-% IV SOLN
INTRAVENOUS | Status: AC
Start: 2017-09-13 — End: ?
  Filled 2017-09-13: qty 50

## 2017-09-13 MED ORDER — PALONOSETRON HCL INJECTION 0.25 MG/5ML
0.2500 mg | Freq: Once | INTRAVENOUS | Status: AC
  Administered 2017-09-13: 0.25 mg via INTRAVENOUS

## 2017-09-13 MED ORDER — ATROPINE SULFATE 1 MG/ML IJ SOLN
0.5000 mg | Freq: Once | INTRAMUSCULAR | Status: AC | PRN
  Administered 2017-09-13: 0.5 mg via INTRAVENOUS

## 2017-09-13 MED ORDER — SODIUM CHLORIDE 0.9 % IV SOLN
2200.0000 mg/m2 | INTRAVENOUS | Status: DC
  Administered 2017-09-13: 3750 mg via INTRAVENOUS
  Filled 2017-09-13: qty 75

## 2017-09-13 MED ORDER — DIPHENHYDRAMINE HCL 25 MG PO CAPS
ORAL_CAPSULE | ORAL | Status: AC
  Filled 2017-09-13: qty 1

## 2017-09-13 MED ORDER — IRINOTECAN HCL CHEMO INJECTION 100 MG/5ML
150.0000 mg/m2 | Freq: Once | INTRAVENOUS | Status: AC
  Administered 2017-09-13: 260 mg via INTRAVENOUS
  Filled 2017-09-13: qty 13

## 2017-09-13 MED ORDER — OXALIPLATIN CHEMO INJECTION 100 MG/20ML
70.0000 mg/m2 | Freq: Once | INTRAVENOUS | Status: AC
  Administered 2017-09-13: 120 mg via INTRAVENOUS
  Filled 2017-09-13: qty 20

## 2017-09-13 MED ORDER — DEXAMETHASONE SODIUM PHOSPHATE 10 MG/ML IJ SOLN
10.0000 mg | Freq: Once | INTRAMUSCULAR | Status: AC
  Administered 2017-09-13: 10 mg via INTRAVENOUS

## 2017-09-13 MED ORDER — LEUCOVORIN CALCIUM INJECTION 350 MG
400.0000 mg/m2 | Freq: Once | INTRAVENOUS | Status: AC
  Administered 2017-09-13: 680 mg via INTRAVENOUS
  Filled 2017-09-13: qty 34

## 2017-09-13 MED ORDER — POTASSIUM CHLORIDE CRYS ER 20 MEQ PO TBCR
EXTENDED_RELEASE_TABLET | ORAL | Status: AC
  Filled 2017-09-13: qty 1

## 2017-09-13 MED ORDER — SODIUM CHLORIDE 0.9% FLUSH
10.0000 mL | INTRAVENOUS | Status: DC | PRN
  Administered 2017-09-13: 10 mL
  Filled 2017-09-13: qty 10

## 2017-09-13 MED ORDER — ATROPINE SULFATE 1 MG/ML IJ SOLN
INTRAMUSCULAR | Status: AC
  Filled 2017-09-13: qty 1

## 2017-09-13 NOTE — Telephone Encounter (Signed)
Per 7/18 los printed avs and calender of upcoming appointment. Appointment dates were changed as requested by provider.  I Levada Dy) accidentally canceled 8/1 infusion appt. And had to r/s it back on with in minutes.

## 2017-09-13 NOTE — Progress Notes (Signed)
Per Dr. Burr Medico,  [place] to treat today with Platelet  93.  MD reduced chemo dose.  Pt to receive K-dur 20 meq 1 tab today while in infusion.

## 2017-09-13 NOTE — Telephone Encounter (Signed)
R/s patients appts per daughters request.

## 2017-09-13 NOTE — Patient Instructions (Signed)
[facility] Discharge Instructions for Patients Receiving Chemotherapy  Today you received the following chemotherapy agents: Oxaliplatin, Leucovorin, Irinotecan, 5FU  To help prevent nausea and vomiting after your treatment, we encourage you to take your nausea medication as directed.   If you develop nausea and vomiting that is not controlled by your nausea medication, call the clinic.   BELOW ARE SYMPTOMS THAT SHOULD BE REPORTED IMMEDIATELY:  *FEVER GREATER THAN 100.5 F  *CHILLS WITH OR WITHOUT FEVER  NAUSEA AND VOMITING THAT IS NOT CONTROLLED WITH YOUR NAUSEA MEDICATION  *UNUSUAL SHORTNESS OF BREATH  *UNUSUAL BRUISING OR BLEEDING  TENDERNESS IN MOUTH AND THROAT WITH OR WITHOUT PRESENCE OF ULCERS  *URINARY PROBLEMS  *BOWEL PROBLEMS  UNUSUAL RASH Items with * indicate a potential emergency and should be followed up as soon as possible.  Feel free to call the clinic should you have any questions or concerns. The clinic phone number is [phone].  Please show the CHEMO ALERT CARD at check-in to the Emergency Department and triage nurse.   

## 2017-09-14 LAB — CANCER ANTIGEN 19-9: CA 19-9: 170 U/mL — ABNORMAL HIGH (ref 0–35)

## 2017-09-15 ENCOUNTER — Inpatient Hospital Stay: Payer: Medicare Other

## 2017-09-15 VITALS — BP 128/68 | HR 94 | Temp 98.1°F | Resp 16

## 2017-09-15 DIAGNOSIS — E119 Type 2 diabetes mellitus without complications: Secondary | ICD-10-CM | POA: Diagnosis not present

## 2017-09-15 DIAGNOSIS — C250 Malignant neoplasm of head of pancreas: Secondary | ICD-10-CM | POA: Diagnosis not present

## 2017-09-15 DIAGNOSIS — Z452 Encounter for adjustment and management of vascular access device: Secondary | ICD-10-CM | POA: Diagnosis not present

## 2017-09-15 DIAGNOSIS — Z5111 Encounter for antineoplastic chemotherapy: Secondary | ICD-10-CM | POA: Diagnosis not present

## 2017-09-15 DIAGNOSIS — R197 Diarrhea, unspecified: Secondary | ICD-10-CM | POA: Diagnosis not present

## 2017-09-15 DIAGNOSIS — Z7189 Other specified counseling: Secondary | ICD-10-CM

## 2017-09-15 DIAGNOSIS — Z5189 Encounter for other specified aftercare: Secondary | ICD-10-CM | POA: Diagnosis not present

## 2017-09-15 MED ORDER — HEPARIN SOD (PORK) LOCK FLUSH 100 UNIT/ML IV SOLN
500.0000 [IU] | Freq: Once | INTRAVENOUS | Status: AC | PRN
Start: 2017-09-15 — End: 2017-09-15
  Administered 2017-09-15: 500 [IU]
  Filled 2017-09-15: qty 5

## 2017-09-15 MED ORDER — PEGFILGRASTIM INJECTION 6 MG/0.6ML ~~LOC~~
PREFILLED_SYRINGE | SUBCUTANEOUS | Status: AC
Start: 2017-09-15 — End: ?
  Filled 2017-09-15: qty 0.6

## 2017-09-15 MED ORDER — PEGFILGRASTIM INJECTION 6 MG/0.6ML ~~LOC~~
6.0000 mg | PREFILLED_SYRINGE | Freq: Once | SUBCUTANEOUS | Status: AC
  Administered 2017-09-15: 6 mg via SUBCUTANEOUS

## 2017-09-15 MED ORDER — SODIUM CHLORIDE 0.9% FLUSH
10.0000 mL | INTRAVENOUS | Status: DC | PRN
  Administered 2017-09-15: 10 mL
  Filled 2017-09-15: qty 10

## 2017-09-15 NOTE — Patient Instructions (Signed)
Pegfilgrastim injection What is this medicine? PEGFILGRASTIM (PEG fil gra stim) is a long-acting granulocyte colony-stimulating factor that stimulates the growth of neutrophils, a type of white blood cell important in the body's fight against infection. It is used to reduce the incidence of fever and infection in patients with certain types of cancer who are receiving chemotherapy that affects the bone marrow, and to increase survival after being exposed to high doses of radiation. This medicine may be used for other purposes; ask your health care provider or pharmacist if you have questions. COMMON BRAND NAME(S): Neulasta What should I tell my health care provider before I take this medicine? They need to know if you have any of these conditions: -kidney disease -latex allergy -ongoing radiation therapy -sickle cell disease -skin reactions to acrylic adhesives (On-Body Injector only) -an unusual or allergic reaction to pegfilgrastim, filgrastim, other medicines, foods, dyes, or preservatives -pregnant or trying to get pregnant -breast-feeding How should I use this medicine? This medicine is for injection under the skin. If you get this medicine at home, you will be taught how to prepare and give the pre-filled syringe or how to use the On-body Injector. Refer to the patient Instructions for Use for detailed instructions. Use exactly as directed. Tell your healthcare provider immediately if you suspect that the On-body Injector may not have performed as intended or if you suspect the use of the On-body Injector resulted in a missed or partial dose. It is important that you put your used needles and syringes in a special sharps container. Do not put them in a trash can. If you do not have a sharps container, call your pharmacist or healthcare provider to get one. Talk to your pediatrician regarding the use of this medicine in children. While this drug may be prescribed for selected conditions,  precautions do apply. Overdosage: If you think you have taken too much of this medicine contact a poison control center or emergency room at once. NOTE: This medicine is only for you. Do not share this medicine with others. What if I miss a dose? It is important not to miss your dose. Call your doctor or health care professional if you miss your dose. If you miss a dose due to an On-body Injector failure or leakage, a new dose should be administered as soon as possible using a single prefilled syringe for manual use. What may interact with this medicine? Interactions have not been studied. Give your health care provider a list of all the medicines, herbs, non-prescription drugs, or dietary supplements you use. Also tell them if you smoke, drink alcohol, or use illegal drugs. Some items may interact with your medicine. This list may not describe all possible interactions. Give your health care provider a list of all the medicines, herbs, non-prescription drugs, or dietary supplements you use. Also tell them if you smoke, drink alcohol, or use illegal drugs. Some items may interact with your medicine. What should I watch for while using this medicine? You may need blood work done while you are taking this medicine. If you are going to need a MRI, CT scan, or other procedure, tell your doctor that you are using this medicine (On-Body Injector only). What side effects may I notice from receiving this medicine? Side effects that you should report to your doctor or health care professional as soon as possible: -allergic reactions like skin rash, itching or hives, swelling of the face, lips, or tongue -dizziness -fever -pain, redness, or irritation at site   where injected -pinpoint red spots on the skin -red or dark-brown urine -shortness of breath or breathing problems -stomach or side pain, or pain at the shoulder -swelling -tiredness -trouble passing urine or change in the amount of urine Side  effects that usually do not require medical attention (report to your doctor or health care professional if they continue or are bothersome): -bone pain -muscle pain This list may not describe all possible side effects. Call your doctor for medical advice about side effects. You may report side effects to FDA at 1-800-FDA-1088. Where should I keep my medicine? Keep out of the reach of children. Store pre-filled syringes in a refrigerator between 2 and 8 degrees C (36 and 46 degrees F). Do not freeze. Keep in carton to protect from light. Throw away this medicine if it is left out of the refrigerator for more than 48 hours. Throw away any unused medicine after the expiration date. NOTE: This sheet is a summary. It may not cover all possible information. If you have questions about this medicine, talk to your doctor, pharmacist, or health care provider.  2018 [employer] (2016-02-10 12:58:03)  

## 2017-09-18 NOTE — Progress Notes (Signed)
Carly [name]  [phone] [phone]  Clinic Follow Up Note   Patient Care Team: Carly Austin, MD as PCP - General (Family Medicine) Carly Silence, MD as Consulting Physician (Gastroenterology) Carly Merle, MD as Consulting Physician (Hematology).  Date of Service:  09/27/2017  CHIEF COMPLAINTS:  F/u for Pancreatic Cancer, stage III   Oncology History   Cancer Staging Pancreatic cancer [facility]) Staging form: Exocrine Pancreas, AJCC 8th Edition - Clinical stage from 06/13/2017: Stage III (cT4, cN0, cM0) - Signed by Carly Merle, MD on 06/13/2017       Pancreatic cancer ([place])   06/13/2017 Initial Diagnosis    Pancreatic cancer ([place])      06/13/2017 Cancer Staging    Staging form: Exocrine Pancreas, AJCC 8th Edition - Clinical stage from 06/13/2017: Stage III (cT4, cN0, cM0) - Signed by Carly Merle, MD on 06/13/2017      06/13/2017 Procedure    EUS by Dr. Paulita [name] 06/13/17  IMPRESSION:  -There was no sign of significant pathology in the ampulla. - There was no evidence of significant pathology in the left lobe of the liver. - A mass was identified in the pancreatic head. Abutment SMV; invasion portal confluence; no adenopathy. Fine needle aspiration performed. If this is a pancreatic adenocarcinoma, it would be staged T4/N0/Mx by EUS.      06/13/2017 Initial Biopsy    Diagnosis 06/13/17 FINE NEEDLE ASPIRATION, ENDOSCOPIC, PANCREAS HEAD (SPECIMEN 1 OF 1 COLLECTED 06/13/17): ADENOCARCINOMA. Preliminary Diagnosis Intraoperative Diagnosis: 1-3) ATYPICAL CELLS, ADDITIONAL MATERIAL REQUESTED. (NDK) 4-6 ADENOCARCINOMA. (NDK) Reported to Dr. Paulita [name] on 06/13/17 @ 11:07am.       06/21/2017 Imaging    CT Chest without contrast IMPRESSION: 1. No findings suspicious for metastatic disease within the chest.  2. Tiny subpleural right lower lobe pulmonary nodule is nonspecific, but likely benign. This can be addressed on follow-up imaging.  3. Aortic  Atherosclerosis (ICD10-I70.0).      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  The Hereditary Gene Panel offered by [facility] includes sequencing and/or deletion duplication testing of the following 47 genes: APC, ATM, AXIN2, BARD1, BMPR1A, BRCA1, BRCA2, BRIP1, CDH1, CDK4, CDKN2A (p14ARF), CDKN2A (p16INK4a), CHEK2, CTNNA1, DICER1, EPCAM (Deletion/duplication testing only), GREM1 (promoter region deletion/duplication testing only), KIT, MEN1, MLH1, MSH2, MSH3, MSH6, MUTYH, NBN, NF1, NHTL1, PALB2, PDGFRA, PMS2, POLD1, POLE, PTEN, RAD50, RAD51C, RAD51D, SDHB, SDHC, SDHD, SMAD4, SMARCA4. STK11, TP53, TSC1, TSC2, and VHL.  The following genes were evaluated for sequence changes only: SDHA and HOXB13 c.251G>A variant only. The report date is August 14, 2017.        HISTORY OF PRESENTING ILLNESS: 06/14/17 Carly [name] 77 y.o. female is a here because of newly diagnosed Pancreatic Cancer. The patient was referred by Dr. Paulita [name]. The patient presents to the clinic today accompanied by her 2 sons and her daughter-in-law.   She was having aching of her left abdominal starting 3 months ago after UTI. This pain comes and goes, which worsened at night. She denies issues with her appetite overall but she does not eat as much as she did due to the recent death of her husband. She denies any abdominal bloating. The color of urine has not become dark.   Today the patient notes she still has left abdominal pain. She notes she plans to see her grandson in  [place] this weekend. She plans to go to the beach for 1-2 weeks in May, 2018. She prefers to be reached by her home phone and then her daughter-in-law.  Socially she lives by herself since her husband's passing in 12/2016. Her house is 1 level. She has support from her son and his wife. She has good family support. She has a  stationary bike he rides 30 minutes a day and keeps track of her steps per day. She is very active and goes out often. 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   In the past the patient had a complete hysterectomy in early 1981 for bleeding. During her surgery she had cancer cells found and had a BSO. She had no following treatments. She had a pelvic rebuilt in 1990s due to prolapse. She had a yearly pap smear. She is diagnosed os type II DM but is not on any medication at this time.  She takes tramadol for the pain as needed, trazodone to help her sleep and takes XANAX for her anxiety. She has been on this for years. She has been on Trazodone for 40 years to also help her anxiety. She takes Lipitor for her high cholesterol, not regularly. She takes HCTZ for her fluid retention which she takes as needed. She takes Lisinopril for her HTN. She takes Synthroid for her hypothyroidism.  Her maternal grandmother had breast cancer. Her maternal cousin had breast cancer and her paternal cousin had a blood disorder. She smoked for 10-15 years of 1/2-1 pack a day before stopping in 1980s.   On review of symptoms, pt notes left abdominal ache which she tried tylenol and she was recently given Tramadol. This helped her some. She denies abdominal bloating, dark urine or jaundice and denies dysuria.  CURRENT THERAPY: chemo FOLFIRINOX every 2 weeks started 07/19/17. Given her thrombocytopenia I reduce her dose by 10-15% starting with cycle 5.   INTERVAL HISTORY:   Carly [name] is here for follow-up on disease progression and cycle 6 chemo. She presents to the clinic today accompanied by a family member. She is worried about the fact that her tumor size appears larger. She is not having pain and she is using less pain medication. She reports good appetite and energy level. She said that she recently had mouth sores that are better now.     MEDICAL HISTORY:  Past Medical  History:  Diagnosis Date  . Allergic rhinitis    Skin Test 04-14-2009  . Asthma   . Cancer ([place]) 1981   adenocarcinoma of uterus  . Diabetes mellitus without complication ([place])    type II, no meds per pt  . Family history of breast cancer   . History of uterine cancer   . Hyperlipemia   . Hypertension   . Insomnia     SURGICAL HISTORY: Past Surgical History:  Procedure Laterality Date  . ABDOMINAL HYSTERECTOMY    . CARPAL TUNNEL RELEASE    . EUS N/A 06/13/2017   Procedure: FULL UPPER ENDOSCOPIC ULTRASOUND (EUS) RADIAL;  Surgeon: Carly Silence, MD;  Location: [facility];  Service: Endoscopy;  Laterality: N/A;  . I&D EXTREMITY  09/17/2011   Procedure: IRRIGATION AND DEBRIDEMENT EXTREMITY;  Surgeon: Roseanne Kaufman, MD;  Location: [facility];  Service: Orthopedics;  Laterality: Left;  with drain placement  . IR FLUORO GUIDE PORT INSERTION RIGHT  07/18/2017  . IR US GUIDE VASC ACCESS RIGHT  07/18/2017  . NASAL SEPTOPLASTY W/  TURBINOPLASTY    . pelvic floor rebuild    . TONSILLECTOMY    . TOTAL ABDOMINAL HYSTERECTOMY W/ BILATERAL SALPINGOOPHORECTOMY      SOCIAL HISTORY: Social History   Socioeconomic History  . Marital status: Widowed    Spouse name: Not on file  . Number of children: Not on file  . Years of education: Not on file  . Highest education level: Not on file  Occupational History  . Not on file  Social Needs  . Financial resource strain: Not on file  . Food insecurity:    Worry: Not on file    Inability: Not on file  . Transportation needs:    Medical: Not on file    Non-medical: Not on file  Tobacco Use  . Smoking status: Former Smoker    Packs/day: 0.50    Years: 15.00    Pack years: 7.50    Types: Cigarettes    Last attempt to quit: 02/27/1978    Years since quitting: 39.6  . Smokeless tobacco: Never Used  Substance and Sexual Activity  . Alcohol use: Yes    Alcohol/week: 0.0 oz    Comment: occ- glass of wine  . Drug use: No  . Sexual activity: Yes      Comment: 1st intercouse- 15, partners- 1  Lifestyle  . Physical activity:    Days per week: Not on file    Minutes per session: Not on file  . Stress: Not on file  Relationships  . Social connections:    Talks on phone: Not on file    Gets together: Not on file    Attends religious service: Not on file    Active member of club or organization: Not on file    Attends meetings of clubs or organizations: Not on file    Relationship status: Not on file  . Intimate partner violence:    Fear of current or ex partner: Not on file    Emotionally abused: Not on file    Physically abused: Not on file    Forced sexual activity: Not on file  Other Topics Concern  . Not on file  Social History Narrative  . Not on file    FAMILY HISTORY: Family History  Problem Relation Age of Onset  . Stroke Mother 60  . Heart attack Father 15  . Dementia Brother   . Hypertension Brother   . Skin cancer Brother   . Breast cancer Maternal Grandmother        dx >50  . Breast cancer Cousin        mat first cousin, dx in her 61s  . Cancer Cousin        oral cancer  . Breast cancer Cousin        mat first cousin, dx in her 71s  . Heart disease Maternal Uncle   . Other Maternal Grandfather        suicide  . Heart disease Paternal Grandmother     ALLERGIES:  is allergic to cephalexin and nabumetone.  MEDICATIONS:  Current Outpatient Medications  Medication Sig Dispense Refill  . ALPRAZolam (XANAX) 0.5 MG tablet Take 1 tablet (0.5 mg total) by mouth at bedtime as needed for anxiety. 90 tablet 0  . Ascorbic Acid (VITAMIN C) 500 MG PACK Take 100 mg by mouth daily.    Marland Kitchen atorvastatin (LIPITOR) 10 MG tablet Take 10 mg by mouth at bedtime.     Marland Kitchen CALCIUM PO Take 1 tablet by mouth  daily.     . Cholecalciferol (VITAMIN D) 2000 units CAPS Take by mouth.    . Cyanocobalamin (VITAMIN B-12 PO) Take 1 tablet by mouth daily.     . diphenoxylate-atropine (LOMOTIL) 2.5-0.025 MG tablet Take 1-2 tablets by  mouth 4 (four) times daily as needed for diarrhea or loose stools. 60 tablet 1  . hydrochlorothiazide (HYDRODIURIL) 25 MG tablet Take 1 tablet by mouth daily as needed.     Marland Kitchen HYDROcodone-acetaminophen (NORCO) 5-325 MG tablet Take 1 tablet by mouth every 6 (six) hours as needed for moderate pain. 40 tablet 0  . hyoscyamine (LEVBID) 0.375 MG 12 hr tablet Take 0.375 mg by mouth every 12 (twelve) hours as needed for cramping.     . latanoprost (XALATAN) 0.005 % ophthalmic solution 1 drop at bedtime.    Marland Kitchen levothyroxine (SYNTHROID, LEVOTHROID) 25 MCG tablet Take 25 mcg by mouth daily before breakfast.     . lidocaine-prilocaine (EMLA) cream Apply to affected area once 30 g 3  . lisinopril (PRINIVIL,ZESTRIL) 10 MG tablet Take 10 mg by mouth at bedtime.    . magic mouthwash w/lidocaine SOLN Take 5 mLs by mouth 3 (three) times daily as needed for mouth pain. 240 mL 0  . nitrofurantoin, macrocrystal-monohydrate, (MACROBID) 100 MG capsule Take 1 capsule (100 mg total) by mouth 2 (two) times daily. 10 capsule 0  . omeprazole (PRILOSEC) 20 MG capsule Take 1 tablet by mouth daily.    . ondansetron (ZOFRAN) 8 MG tablet Take 1 tablet (8 mg total) by mouth 2 (two) times daily as needed for refractory nausea / vomiting. Start on day 3 after chemotherapy. 30 tablet 1  . ONETOUCH DELICA LANCETS 93T MISC USE TO TEST YOUR BLOOD SUGAR ONCE A DAY FASTING AND 2 HRS AFTER A MEAL AS NEEDED  3  . ONETOUCH VERIO test strip USE TO TEST YOUR BLOOD SUGAR ONCE A DAY FASTING & 2 HRS AFTER A MEAL AS NEEDED  3  . potassium chloride SA (K-DUR,KLOR-CON) 20 MEQ tablet Take 1 tablet (20 mEq total) by mouth 2 (two) times daily. 60 tablet 1  . prochlorperazine (COMPAZINE) 10 MG tablet Take 1 tablet (10 mg total) by mouth every 6 (six) hours as needed (NAUSEA). 30 tablet 2  . traMADol (ULTRAM) 50 MG tablet Take 1-2 tablets every 6 hours as needed for pain 180 tablet 0  . traZODone (DESYREL) 100 MG tablet Take 1 tablet (100 mg total) by mouth  at bedtime. (Patient taking differently: Take 50 mg by mouth at bedtime. ) 90 tablet 3  . Omega-3 Fatty Acids (FISH OIL PO) Take 1 capsule by mouth daily.      No current facility-administered medications for this visit.    Facility-Administered Medications Ordered in Other Visits  Medication Dose Route Frequency Provider Last Rate Last Dose  . sodium chloride flush (NS) 0.9 % injection 10 mL  10 mL Intracatheter PRN Carly Merle, MD   10 mL at 09/27/17 1047    REVIEW OF SYSTEMS:  Constitutional: Denies fevers, chills or abnormal night sweats (+) weight trending down (+) good appetite (+) pain improving Eyes: Denies double vision or watery eyes, blurred vision Ears, nose, mouth, throat, and face: Denies mucositis or sore throat (+) mouth sores  Respiratory: Denies cough, dyspnea or wheezes Cardiovascular: Denies palpitation, chest discomfort  Gastrointestinal:  Denies heartburn (+) intermittent diarrhea   Urinary: Denies frequency, urgency, burning, or incontinence.  Skin: Denies abnormal skin rashes (+) complete hair loss Lymphatics: Denies new lymphadenopathy  or easy bruising Neurological:Denies numbness, tingling or new weaknesses MSK: No myalgia Behavioral/Psych: Mood is stable, no new changes All other systems were reviewed with the patient and are negative.  PHYSICAL EXAMINATION: ECOG PERFORMANCE STATUS: 1 - Symptomatic but completely ambulatory  Vitals:   09/27/17 0945  BP: 124/67  Pulse: 89  Resp: 18  Temp: 97.7 F (36.5 C)  SpO2: 100%   Filed Weights   09/27/17 0945  Weight: 137 lb 4.8 oz (62.3 kg)    GENERAL:alert, no distress and comfortable (+) hair loss (+) weight loss SKIN: skin color, texture, turgor are normal, no rashes or significant lesions EYES: normal, conjunctiva are pink and non-injected, sclera clear OROPHARYNX:no exudate, no erythema and lips, buccal mucosa, and tongue normal  NECK: supple, thyroid normal size, non-tender, without nodularity LYMPH:   no palpable lymphadenopathy in the cervical, axillary or inguinal LUNGS: clear to auscultation and percussion with normal breathing effort HEART: regular rhythm and no murmurs and no lower extremity edema  ABDOMEN:abdomen soft, non-tender and normal bowel sounds Musculoskeletal:no cyanosis of digits and no clubbing  PSYCH: alert & oriented x 3 with fluent speech NEURO: no focal motor/sensory deficits  LABORATORY DATA:  I have reviewed the data as listed CBC Latest Ref Rng & Units 09/27/2017 09/13/2017 08/29/2017  WBC 3.9 - 10.3 K/uL 7.4 13.1(H) 11.9(H)  Hemoglobin 11.6 - 15.9 g/dL 9.1(L) 9.7(L) 10.3(L)  Hematocrit 34.8 - 46.6 % 26.8(L) 28.7(L) 29.4(L)  Platelets 145 - 400 K/uL 61(L) 93(L) 130(L)    CMP Latest Ref Rng & Units 09/27/2017 09/13/2017 08/29/2017  Glucose 70 - 99 mg/dL 178(H) 144(H) 160(H)  BUN 8 - 23 mg/dL _0 Creatinine 0.44 - 1.00 mg/dL 0.73 0.71 0.74  Sodium 135 - 145 mmol/L 141 141 142  Potassium 3.5 - 5.1 mmol/L 3.5 3.0(LL) 2.7(LL)  Chloride 98 - 111 mmol/L 107 107 105  CO2 22 - 32 mmol/L _1 Calcium 8.9 - 10.3 mg/dL 9.2 9.1 9.2  Total Protein 6.5 - 8.1 g/dL 5.4(L) 5.2(L) 5.5(L)  Total Bilirubin 0.3 - 1.2 mg/dL 0.5 0.5 0.5  Alkaline Phos 38 - 126 U/L 136(H) 126 129(H)  AST 15 - 41 U/L _2 ALT 0 - 44 U/L _3 CA 19-9  07/19/17: 1436 08/16/17 606  09/13/17: 170    PATHOLOGY  08/08/2017 Molecular Pathology   Diagnosis 06/13/17 FINE NEEDLE ASPIRATION, ENDOSCOPIC, PANCREAS HEAD (SPECIMEN 1 OF 1 COLLECTED 06/13/17): ADENOCARCINOMA. Preliminary Diagnosis Intraoperative Diagnosis: 1-3) ATYPICAL CELLS, ADDITIONAL MATERIAL REQUESTED. (NDK) 4-6 ADENOCARCINOMA. (NDK) Reported to Dr. Paulita [name] on 06/13/17 @ 11:07am.    PROCEDURES  EUS by Dr. Paulita [name] 06/13/17  IMPRESSION:  -There was no sign of significant pathology in the ampulla. - There was no evidence of significant pathology in the left lobe of the liver. - A mass was identified in the pancreatic  head. Abutment SMV; invasion portal confluence; no adenopathy. Fine needle aspiration performed. If this is a pancreatic adenocarcinoma, it would be staged T4/N0/Mx by EUS.   RADIOGRAPHIC STUDIES: I have personally reviewed the radiological images as listed and agreed with the findings in the report. Ct Chest W Contrast  Result Date: 09/24/2017 CLINICAL DATA:  Restaging pancreas adenocarcinoma EXAM: CT CHEST, ABDOMEN, AND PELVIS WITH CONTRAST TECHNIQUE: Multidetector CT imaging of the chest, abdomen and pelvis was performed following the standard protocol during bolus administration of intravenous contrast. CONTRAST:  163m ISOVUE-300 IOPAMIDOL (ISOVUE-300) INJECTION 61% COMPARISON:  06/21/2017 FINDINGS: CT CHEST FINDINGS  Cardiovascular: The heart size is mildly enlarged. No pericardial effusion identified. Aortic atherosclerosis. Calcifications within the LAD coronary arteries noted. Mediastinum/Nodes: Normal appearance of the thyroid gland. The trachea appears patent and is midline. Normal appearance of the esophagus. No enlarged mediastinal or hilar lymph nodes. Lungs/Pleura: No pleural effusion. No airspace consolidation, atelectasis or pneumothorax identified. Previously noted 4 mm subpleural nodule in the right lower lobe is no longer visualized. Musculoskeletal: Spondylosis noted within the thoracic spine. No aggressive lytic or sclerotic bone lesions. CT ABDOMEN PELVIS FINDINGS Hepatobiliary: No suspicious liver abnormality. The gallbladder appears normal. No biliary dilatation. Pancreas: The mass involving the head of pancreas measures 2.5 x 3.8 by 3.1 cm. On the previous exam this measured 2.2 x 2.6 by 3.4 cm. There is associated atrophy of the pancreatic body and tail with dilatation of the main duct. There has been progressive medial and posterior extension of tumor which partially encases the SMA and celiac trunk, image 40/2 and image 44/2. Encasement and narrowing of the portal venous  confluence appears similar to previous exam. Spleen: Normal in size without focal abnormality. Adrenals/Urinary Tract: The adrenal glands appear normal. Bilateral renal sinus cysts noted. No kidney mass or hydronephrosis. The urinary bladder is unremarkable. Stomach/Bowel: Stomach is normal. No abnormal small bowel dilatation. No pathologic dilatation of the colon. Scattered distal colonic diverticula noted without acute inflammation Vascular/Lymphatic: Aortic atherosclerosis. No aneurysm. The main portal vein and intrahepatic branches are patent. The hepatic veins are patent. No abdominal adenopathy. No pelvic or inguinal adenopathy. Reproductive: Status post hysterectomy. No adnexal masses. Other: No ascites.  No peritoneal nodularity or mass identified. Musculoskeletal: Mild spondylosis identified within the lumbar spine. IMPRESSION: 1. There is been interval local progression of disease with increased size of head of pancreas neoplasm. There is progressive vascular involvement with partial encasement of the celiac trunk and SMA. Continued encasement and marked narrowing of the portal venous confluence. 2. No evidence for metastatic adenopathy within the abdomen, liver metastasis or pulmonary metastasis. 3. Aortic atherosclerosis and LAD coronary artery atherosclerotic calcifications. Aortic Atherosclerosis (ICD10-I70.0). Electronically Signed   By: Kerby Moors M.D.   On: 09/24/2017 14:22   Ct Abdomen Pelvis W Contrast  Result Date: 09/24/2017 CLINICAL DATA:  Restaging pancreas adenocarcinoma EXAM: CT CHEST, ABDOMEN, AND PELVIS WITH CONTRAST TECHNIQUE: Multidetector CT imaging of the chest, abdomen and pelvis was performed following the standard protocol during bolus administration of intravenous contrast. CONTRAST:  189m ISOVUE-300 IOPAMIDOL (ISOVUE-300) INJECTION 61% COMPARISON:  06/21/2017 FINDINGS: CT CHEST FINDINGS Cardiovascular: The heart size is mildly enlarged. No pericardial effusion identified.  Aortic atherosclerosis. Calcifications within the LAD coronary arteries noted. Mediastinum/Nodes: Normal appearance of the thyroid gland. The trachea appears patent and is midline. Normal appearance of the esophagus. No enlarged mediastinal or hilar lymph nodes. Lungs/Pleura: No pleural effusion. No airspace consolidation, atelectasis or pneumothorax identified. Previously noted 4 mm subpleural nodule in the right lower lobe is no longer visualized. Musculoskeletal: Spondylosis noted within the thoracic spine. No aggressive lytic or sclerotic bone lesions. CT ABDOMEN PELVIS FINDINGS Hepatobiliary: No suspicious liver abnormality. The gallbladder appears normal. No biliary dilatation. Pancreas: The mass involving the head of pancreas measures 2.5 x 3.8 by 3.1 cm. On the previous exam this measured 2.2 x 2.6 by 3.4 cm. There is associated atrophy of the pancreatic body and tail with dilatation of the main duct. There has been progressive medial and posterior extension of tumor which partially encases the SMA and celiac trunk, image 40/2 and image 44/2. Encasement  and narrowing of the portal venous confluence appears similar to previous exam. Spleen: Normal in size without focal abnormality. Adrenals/Urinary Tract: The adrenal glands appear normal. Bilateral renal sinus cysts noted. No kidney mass or hydronephrosis. The urinary bladder is unremarkable. Stomach/Bowel: Stomach is normal. No abnormal small bowel dilatation. No pathologic dilatation of the colon. Scattered distal colonic diverticula noted without acute inflammation Vascular/Lymphatic: Aortic atherosclerosis. No aneurysm. The main portal vein and intrahepatic branches are patent. The hepatic veins are patent. No abdominal adenopathy. No pelvic or inguinal adenopathy. Reproductive: Status post hysterectomy. No adnexal masses. Other: No ascites.  No peritoneal nodularity or mass identified. Musculoskeletal: Mild spondylosis identified within the lumbar spine.  IMPRESSION: 1. There is been interval local progression of disease with increased size of head of pancreas neoplasm. There is progressive vascular involvement with partial encasement of the celiac trunk and SMA. Continued encasement and marked narrowing of the portal venous confluence. 2. No evidence for metastatic adenopathy within the abdomen, liver metastasis or pulmonary metastasis. 3. Aortic atherosclerosis and LAD coronary artery atherosclerotic calcifications. Aortic Atherosclerosis (ICD10-I70.0). Electronically Signed   By: Kerby Moors M.D.   On: 09/24/2017 14:22    ASSESSMENT & PLAN:  ROMANDA TURRUBIATES is a 77 y.o. Caucasian female with a history of hypothyroidism, GERD, Anxiety, osteopenia, Glaucoma, HTN, hypercholesterolemia, and H/o Uterine Cancer, presented with intermittent upper abdominal pain    1. 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  -She has locally advanced Adenocarcinoma of Pancreatic Head.  The EUS showed a 3.0 x 3.0 cm mass in the pancreatic head, with sonographic evidence suggesting invasion into the superior mesenteric artery, the portal vein,, the SMV, and splenic vein.  Unfortunately this is unresectable tumor, due to the invasion of SMA. -Staging scan was negative for metastasis. -Her case was discussed in our GI tumor board. Our surgeon Dr. Barry Dienes concurs that her pancreatic cancer is not resectable. -I previously recommended induction chemo for 4-6 months followed by radiation if no disease progression. We also discussed maintenance chemotherapy if she does not want to radiation. -The goal of therapy is palliative, we are not able to cure her cancer -She started FOLFIRINOX q2weeks on 07/19/17. She tolerated moderately well with  moderate diarrhea and fatigue.  -She tolerated full dose cycle 2 and 3  Overall well. She developed Thrombocytopenia after cycle 5 and I reduced dose.  -She is tolerating treatment well overall, her pain has improved, her tumor marker CA 19.9 has decreased significantly, indicating likely good response to treatment. -CT scan on 7/30 showed The mass involving the head of pancreas measures 2.5 x 3.8 by 3.1 cm, slightly bigger than before ( 2.2 x 2.6 by 3.4 cm).  No other evidence of metastasis.  However when you imaging measurement of the pancreatic tumor sometimes could be not very accurate, I will present her case in the GI tumor board next week, to review her scan.  If no significant disease progression, I will continue her current treatment. -If she has truly disease progression, I will switch her chemotherapy to gemcitabine and Abraxane, or consider radiation at this point. -Due to her significant thrombocytopenia, will code chemo today, and reduce dose further. -I encouraged her to eat more and increase her nutritional supplements and watch for worsened leg swelling or pain as  chemo can increase her risk for blood clots.  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 She should reduce sugary fruits and high carb meets and increase vegetable.  -She was previously advised to continue to exercise as tolerable and check her Blood glucose at home in the morning and during the day. -BG today is 178.   3. Diarrhea, Abdominal pain and weight loss -She has previously met with our dietician Carly [name]. I previously encouraged her to take a nutritional supplement -I previously reviewed her pain medication use. She can use Tramadol for moderate pain and hydrocodone for worse pain.  -She previously increased her Imodium  and I prescribed Lomotil on 07/26/17 to take if imodium is not enough.   -Her intermittent diarrhea controlled with lomotil. Her pain is mild and nearly resolved. Weight is trending down, I encouraged her to increase premier supplements.   4. H/o Uterine Cancer, Stage I, diagnosed in 1981 -She had a total hysterectomy and BSO in 1981. She had no following treatments -Followed by her Gynecologist with yearly pap smears.   5. Genetic Testing  -Given her history of uterine cancer and family history of breast cancer she is eligible for genetic testing. She expressed interest and. I previously sent genetic counseling referral. -genetic test was negative   6. Social Support -She lives alone but has good familial support.  -I previously recommended that she has someone check on her weekly especially when on chemo treatment.  -I previously sent a SW referral and she has previously met with them  7. Hypokalemia   - likely related to her diarrhea  -I previously prescribed potassium chloride 20 mEq twice daily for 5 days, then to continue once daily -We will continue close monitoring - I gave her her potassium 20 mEq in infusion room, and she will increase to 20 MEQ twice daily at home   8. HTN, Hypothyroidism  -She is on HCTZ as needed and Lisinopril daily  -She is on Levothyroxine daily -I discussed with Chemo her BP may becomes low. I recommended if she develops this or dizziness I suggest she hold HCTZ first and continue to monitor her BP at home.  -Continue to Follow up with PCP for management and medication.  -BP at 124/67 today. She is currently taking half dose of her BP medication because of low morning BP readings at home. -I advised her to continue monitoring with possibility of stop BP medication if BP continues to drop or is causing symptoms.  9.Goal of care discussion  -We again discussed the incurable nature of her cancer, and the overall poor prognosis, especially if she does not  have good response to chemotherapy or progress on chemo -The patient understands the goal of care is palliative. -she is full code now   PLAN:  -Lab reviewed, due to significant thrombocytopenia, will hold cycle 6 FOLFIRINOX today, and postpone to 2 weeks (per pt), will reduce dose further  -Lab, flush, f/u and FOLFIRINOX in 2 weeks  -I will present her case in tumor board next Wednesday. - I refilled Lomotil today   No orders of the defined types were placed in this encounter.   All questions were answered. The patient knows to call the clinic with any problems, questions or concerns.  I spent  25 minutes counseling the patient face to face. The total time spent in the appointment was 30 minutes and more than 50% was on counseling.  Dierdre Searles Dweik am acting as scribe for Dr. Truitt [name].  I have reviewed the above documentation for accuracy and completeness, and I agree with the above.      Carly Merle, MD 09/27/2017

## 2017-09-20 ENCOUNTER — Other Ambulatory Visit: Payer: Self-pay | Admitting: Hematology

## 2017-09-24 ENCOUNTER — Ambulatory Visit (HOSPITAL_COMMUNITY)
Admission: RE | Admit: 2017-09-24 | Discharge: 2017-09-24 | Disposition: A | Discharge status: Home / Self Care | Payer: Medicare Other | Source: Ambulatory Visit | Attending: Hematology | Admitting: Hematology

## 2017-09-24 DIAGNOSIS — I700 Atherosclerosis of aorta: Secondary | ICD-10-CM | POA: Insufficient documentation

## 2017-09-24 DIAGNOSIS — I2510 Atherosclerotic heart disease of native coronary artery without angina pectoris: Secondary | ICD-10-CM | POA: Insufficient documentation

## 2017-09-24 DIAGNOSIS — C259 Malignant neoplasm of pancreas, unspecified: Secondary | ICD-10-CM | POA: Diagnosis not present

## 2017-09-24 DIAGNOSIS — C250 Malignant neoplasm of head of pancreas: Secondary | ICD-10-CM | POA: Insufficient documentation

## 2017-09-24 MED ORDER — IOPAMIDOL (ISOVUE-300) INJECTION 61%
INTRAVENOUS | Status: AC
  Filled 2017-09-24: qty 100

## 2017-09-24 MED ORDER — IOPAMIDOL (ISOVUE-300) INJECTION 61%
100.0000 mL | Freq: Once | INTRAVENOUS | Status: AC | PRN
  Administered 2017-09-24: 100 mL via INTRAVENOUS

## 2017-09-27 ENCOUNTER — Inpatient Hospital Stay: Payer: Medicare Other | Attending: Hematology | Admitting: Hematology

## 2017-09-27 ENCOUNTER — Telehealth: Payer: Self-pay | Admitting: Hematology

## 2017-09-27 ENCOUNTER — Inpatient Hospital Stay: Payer: Medicare Other

## 2017-09-27 ENCOUNTER — Ambulatory Visit: Payer: Medicare Other

## 2017-09-27 ENCOUNTER — Encounter: Payer: Self-pay | Admitting: Hematology

## 2017-09-27 VITALS — BP 124/67 | HR 89 | Temp 97.7°F | Resp 18 | Ht 62.5 in | Wt 137.3 lb

## 2017-09-27 DIAGNOSIS — E876 Hypokalemia: Secondary | ICD-10-CM | POA: Insufficient documentation

## 2017-09-27 DIAGNOSIS — C250 Malignant neoplasm of head of pancreas: Secondary | ICD-10-CM | POA: Diagnosis not present

## 2017-09-27 DIAGNOSIS — Z803 Family history of malignant neoplasm of breast: Secondary | ICD-10-CM

## 2017-09-27 DIAGNOSIS — Z5189 Encounter for other specified aftercare: Secondary | ICD-10-CM | POA: Diagnosis not present

## 2017-09-27 DIAGNOSIS — E039 Hypothyroidism, unspecified: Secondary | ICD-10-CM | POA: Insufficient documentation

## 2017-09-27 DIAGNOSIS — Z5111 Encounter for antineoplastic chemotherapy: Secondary | ICD-10-CM | POA: Insufficient documentation

## 2017-09-27 DIAGNOSIS — E119 Type 2 diabetes mellitus without complications: Secondary | ICD-10-CM | POA: Insufficient documentation

## 2017-09-27 DIAGNOSIS — Z7984 Long term (current) use of oral hypoglycemic drugs: Secondary | ICD-10-CM

## 2017-09-27 DIAGNOSIS — Z8542 Personal history of malignant neoplasm of other parts of uterus: Secondary | ICD-10-CM

## 2017-09-27 DIAGNOSIS — I10 Essential (primary) hypertension: Secondary | ICD-10-CM | POA: Diagnosis not present

## 2017-09-27 DIAGNOSIS — D6959 Other secondary thrombocytopenia: Secondary | ICD-10-CM

## 2017-09-27 DIAGNOSIS — Z95828 Presence of other vascular implants and grafts: Secondary | ICD-10-CM

## 2017-09-27 DIAGNOSIS — M8580 Other specified disorders of bone density and structure, unspecified site: Secondary | ICD-10-CM

## 2017-09-27 LAB — CBC WITH DIFFERENTIAL (CANCER CENTER ONLY)
Basophils Absolute: 0 10*3/uL (ref 0.0–0.1)
Basophils Relative: 0 %
EOS ABS: 0.1 10*3/uL (ref 0.0–0.5)
Eosinophils Relative: 1 %
HCT: 26.8 % — ABNORMAL LOW (ref 34.8–46.6)
HEMOGLOBIN: 9.1 g/dL — AB (ref 11.6–15.9)
LYMPHS ABS: 1 10*3/uL (ref 0.9–3.3)
LYMPHS PCT: 13 %
MCH: 33.3 pg (ref 25.1–34.0)
MCHC: 34 g/dL (ref 31.5–36.0)
MCV: 98.2 fL (ref 79.5–101.0)
Monocytes Absolute: 0.7 10*3/uL (ref 0.1–0.9)
Monocytes Relative: 9 %
NEUTROS ABS: 5.7 10*3/uL (ref 1.5–6.5)
NEUTROS PCT: 77 %
Platelet Count: 61 10*3/uL — ABNORMAL LOW (ref 145–400)
RBC: 2.73 MIL/uL — AB (ref 3.70–5.45)
RDW: 21.2 % — ABNORMAL HIGH (ref 11.2–14.5)
WBC: 7.4 10*3/uL (ref 3.9–10.3)

## 2017-09-27 LAB — CMP (CANCER CENTER ONLY)
ALT: 20 U/L (ref 0–44)
ANION GAP: 9 (ref 5–15)
AST: 22 U/L (ref 15–41)
Albumin: 2.9 g/dL — ABNORMAL LOW (ref 3.5–5.0)
Alkaline Phosphatase: 136 U/L — ABNORMAL HIGH (ref 38–126)
BUN: 13 mg/dL (ref 8–23)
CHLORIDE: 107 mmol/L (ref 98–111)
CO2: 25 mmol/L (ref 22–32)
Calcium: 9.2 mg/dL (ref 8.9–10.3)
Creatinine: 0.73 mg/dL (ref 0.44–1.00)
Glucose, Bld: 178 mg/dL — ABNORMAL HIGH (ref 70–99)
Potassium: 3.5 mmol/L (ref 3.5–5.1)
Sodium: 141 mmol/L (ref 135–145)
Total Bilirubin: 0.5 mg/dL (ref 0.3–1.2)
Total Protein: 5.4 g/dL — ABNORMAL LOW (ref 6.5–8.1)

## 2017-09-27 MED ORDER — HEPARIN SOD (PORK) LOCK FLUSH 100 UNIT/ML IV SOLN
500.0000 [IU] | Freq: Once | INTRAVENOUS | Status: AC | PRN
  Administered 2017-09-27: 500 [IU]
  Filled 2017-09-27: qty 5

## 2017-09-27 MED ORDER — SODIUM CHLORIDE 0.9% FLUSH
10.0000 mL | INTRAVENOUS | Status: DC | PRN
  Administered 2017-09-27: 10 mL
  Filled 2017-09-27: qty 10

## 2017-09-27 MED ORDER — DIPHENOXYLATE-ATROPINE 2.5-0.025 MG PO TABS: 1.0000 | ORAL_TABLET | Freq: Four times a day (QID) | ORAL | 1 refills | Status: DC | PRN

## 2017-09-27 NOTE — Progress Notes (Signed)
With Platelets of 61 , no treatment today per Dr. Burr Medico, and the pt decided to keep her appointment on Aug. 15, 2019.

## 2017-09-27 NOTE — Patient Instructions (Signed)
Implanted Port Home Guide An implanted port is a type of central line that is placed under the skin. Central lines are used to provide IV access when treatment or nutrition needs to be given through a person's veins. Implanted ports are used for long-term IV access. An implanted port may be placed because:  You need IV medicine that would be irritating to the small veins in your hands or arms.  You need long-term IV medicines, such as antibiotics.  You need IV nutrition for a long period.  You need frequent blood draws for lab tests.  You need dialysis.  Implanted ports are usually placed in the chest area, but they can also be placed in the upper arm, the abdomen, or the leg. An implanted port has two main parts:  Reservoir. The reservoir is round and will appear as a small, raised area under your skin. The reservoir is the part where a needle is inserted to give medicines or draw blood.  Catheter. The catheter is a thin, flexible tube that extends from the reservoir. The catheter is placed into a large vein. Medicine that is inserted into the reservoir goes into the catheter and then into the vein.  How will I care for my incision site? Do not get the incision site wet. Bathe or shower as directed by your health care provider. How is my port accessed? Special steps must be taken to access the port:  Before the port is accessed, a numbing cream can be placed on the skin. This helps numb the skin over the port site.  Your health care provider uses a sterile technique to access the port. ? Your health care provider must put on a mask and sterile gloves. ? The skin over your port is cleaned carefully with an antiseptic and allowed to dry. ? The port is gently pinched between sterile gloves, and a needle is inserted into the port.  Only "non-coring" port needles should be used to access the port. Once the port is accessed, a blood return should be checked. This helps ensure that the port  is in the vein and is not clogged.  If your port needs to remain accessed for a constant infusion, a clear (transparent) bandage will be placed over the needle site. The bandage and needle will need to be changed every week, or as directed by your health care provider.  Keep the bandage covering the needle clean and dry. Do not get it wet. Follow your health care provider's instructions on how to take a shower or bath while the port is accessed.  If your port does not need to stay accessed, no bandage is needed over the port.  What is flushing? Flushing helps keep the port from getting clogged. Follow your health care provider's instructions on how and when to flush the port. Ports are usually flushed with saline solution or a medicine called heparin. The need for flushing will depend on how the port is used.  If the port is used for intermittent medicines or blood draws, the port will need to be flushed: ? After medicines have been given. ? After blood has been drawn. ? As part of routine maintenance.  If a constant infusion is running, the port may not need to be flushed.  How long will my port stay implanted? The port can stay in for as long as your health care provider thinks it is needed. When it is time for the port to come out, surgery will be   done to remove it. The procedure is similar to the one performed when the port was put in. When should I seek immediate medical care? When you have an implanted port, you should seek immediate medical care if:  You notice a bad smell coming from the incision site.  You have swelling, redness, or drainage at the incision site.  You have more swelling or pain at the port site or the surrounding area.  You have a fever that is not controlled with medicine.  This information is not intended to replace advice given to you by your health care provider. Make sure you discuss any questions you have with your health care provider. Document  Released: 02/13/2005 Document Revised: 07/22/2015 Document Reviewed: 10/21/2012 Elsevier Interactive Patient Education  2017 [employer]  

## 2017-09-27 NOTE — Progress Notes (Signed)
Met with patient and son at f/u visit to assess for navigation needs and to offer support and encouragement.  Patient processed what it was like having to shave her head secondary to tx.  I educated patient on our Living With Cancer support group that mets the third Thursday of every month and she will consider attending. Patient and her daughter have scheduled  their free table message through our Family Support center. I also provided patient with the August calendar of [facility] activities and encouraged her to select one activity to try. Patient agreeable. Will continue to reach out to offer support and encouragement.  

## 2017-09-27 NOTE — Telephone Encounter (Signed)
No LOS 8/1

## 2017-09-29 ENCOUNTER — Inpatient Hospital Stay: Payer: Medicare Other

## 2017-10-06 ENCOUNTER — Other Ambulatory Visit: Payer: Self-pay | Admitting: Hematology

## 2017-10-06 DIAGNOSIS — C250 Malignant neoplasm of head of pancreas: Secondary | ICD-10-CM

## 2017-10-08 NOTE — Progress Notes (Signed)
Open in error

## 2017-10-11 ENCOUNTER — Inpatient Hospital Stay: Payer: Medicare Other

## 2017-10-11 ENCOUNTER — Inpatient Hospital Stay (HOSPITAL_BASED_OUTPATIENT_CLINIC_OR_DEPARTMENT_OTHER): Payer: Medicare Other | Admitting: Hematology

## 2017-10-11 ENCOUNTER — Encounter: Payer: Self-pay | Admitting: Hematology

## 2017-10-11 ENCOUNTER — Telehealth: Payer: Self-pay | Admitting: Hematology

## 2017-10-11 VITALS — BP 134/72 | HR 90 | Temp 98.0°F | Resp 18 | Ht 62.5 in | Wt 138.4 lb

## 2017-10-11 DIAGNOSIS — C250 Malignant neoplasm of head of pancreas: Secondary | ICD-10-CM

## 2017-10-11 DIAGNOSIS — E039 Hypothyroidism, unspecified: Secondary | ICD-10-CM | POA: Diagnosis not present

## 2017-10-11 DIAGNOSIS — Z8542 Personal history of malignant neoplasm of other parts of uterus: Secondary | ICD-10-CM

## 2017-10-11 DIAGNOSIS — D6959 Other secondary thrombocytopenia: Secondary | ICD-10-CM | POA: Diagnosis not present

## 2017-10-11 DIAGNOSIS — Z5189 Encounter for other specified aftercare: Secondary | ICD-10-CM

## 2017-10-11 DIAGNOSIS — Z7984 Long term (current) use of oral hypoglycemic drugs: Secondary | ICD-10-CM

## 2017-10-11 DIAGNOSIS — E119 Type 2 diabetes mellitus without complications: Secondary | ICD-10-CM

## 2017-10-11 DIAGNOSIS — E876 Hypokalemia: Secondary | ICD-10-CM | POA: Diagnosis not present

## 2017-10-11 DIAGNOSIS — Z5111 Encounter for antineoplastic chemotherapy: Secondary | ICD-10-CM | POA: Diagnosis not present

## 2017-10-11 DIAGNOSIS — Z7189 Other specified counseling: Secondary | ICD-10-CM

## 2017-10-11 DIAGNOSIS — Z95828 Presence of other vascular implants and grafts: Secondary | ICD-10-CM

## 2017-10-11 LAB — CMP (CANCER CENTER ONLY)
ALT: 35 U/L (ref 0–44)
ANION GAP: 10 (ref 5–15)
AST: 29 U/L (ref 15–41)
Albumin: 3.2 g/dL — ABNORMAL LOW (ref 3.5–5.0)
Alkaline Phosphatase: 169 U/L — ABNORMAL HIGH (ref 38–126)
BUN: 13 mg/dL (ref 8–23)
CHLORIDE: 106 mmol/L (ref 98–111)
CO2: 23 mmol/L (ref 22–32)
CREATININE: 0.74 mg/dL (ref 0.44–1.00)
Calcium: 9.1 mg/dL (ref 8.9–10.3)
Glucose, Bld: 240 mg/dL — ABNORMAL HIGH (ref 70–99)
POTASSIUM: 4.1 mmol/L (ref 3.5–5.1)
Sodium: 139 mmol/L (ref 135–145)
Total Bilirubin: 0.6 mg/dL (ref 0.3–1.2)
Total Protein: 6.2 g/dL — ABNORMAL LOW (ref 6.5–8.1)

## 2017-10-11 LAB — CBC WITH DIFFERENTIAL (CANCER CENTER ONLY)
BASOS PCT: 1 %
Basophils Absolute: 0 10*3/uL (ref 0.0–0.1)
EOS ABS: 0.1 10*3/uL (ref 0.0–0.5)
EOS PCT: 1 %
HCT: 34 % — ABNORMAL LOW (ref 34.8–46.6)
Hemoglobin: 11.1 g/dL — ABNORMAL LOW (ref 11.6–15.9)
Lymphocytes Relative: 15 %
Lymphs Abs: 0.8 10*3/uL — ABNORMAL LOW (ref 0.9–3.3)
MCH: 33.6 pg (ref 25.1–34.0)
MCHC: 32.6 g/dL (ref 31.5–36.0)
MCV: 103 fL — ABNORMAL HIGH (ref 79.5–101.0)
MONO ABS: 0.6 10*3/uL (ref 0.1–0.9)
Monocytes Relative: 10 %
Neutro Abs: 3.9 10*3/uL (ref 1.5–6.5)
Neutrophils Relative %: 73 %
Platelet Count: 122 10*3/uL — ABNORMAL LOW (ref 145–400)
RBC: 3.3 MIL/uL — ABNORMAL LOW (ref 3.70–5.45)
RDW: 17.9 % — AB (ref 11.2–14.5)
WBC Count: 5.3 10*3/uL (ref 3.9–10.3)

## 2017-10-11 MED ORDER — DEXAMETHASONE SODIUM PHOSPHATE 10 MG/ML IJ SOLN
INTRAMUSCULAR | Status: AC
  Filled 2017-10-11: qty 1

## 2017-10-11 MED ORDER — SODIUM CHLORIDE 0.9% FLUSH
10.0000 mL | INTRAVENOUS | Status: DC | PRN
  Administered 2017-10-11: 10 mL
  Filled 2017-10-11: qty 10

## 2017-10-11 MED ORDER — ATROPINE SULFATE 1 MG/ML IJ SOLN
0.5000 mg | Freq: Once | INTRAMUSCULAR | Status: AC | PRN
  Administered 2017-10-11: 0.5 mg via INTRAVENOUS

## 2017-10-11 MED ORDER — DEXTROSE 5 % IV SOLN
130.0000 mg/m2 | Freq: Once | INTRAVENOUS | Status: AC
  Administered 2017-10-11: 220 mg via INTRAVENOUS
  Filled 2017-10-11: qty 11

## 2017-10-11 MED ORDER — ATROPINE SULFATE 1 MG/ML IJ SOLN
INTRAMUSCULAR | Status: AC
  Filled 2017-10-11: qty 1

## 2017-10-11 MED ORDER — DEXTROSE 5 % IV SOLN
Freq: Once | INTRAVENOUS | Status: AC
  Administered 2017-10-11: 10:00:00 via INTRAVENOUS
  Filled 2017-10-11: qty 250

## 2017-10-11 MED ORDER — METFORMIN HCL 500 MG PO TABS: 500.0000 mg | ORAL_TABLET | Freq: Every day | ORAL | 1 refills | Status: DC

## 2017-10-11 MED ORDER — ATROPINE SULFATE 1 MG/ML IJ SOLN
INTRAMUSCULAR | Status: AC
Start: 2017-10-11 — End: ?
  Filled 2017-10-11: qty 1

## 2017-10-11 MED ORDER — SODIUM CHLORIDE 0.9 % IV SOLN
2000.0000 mg/m2 | INTRAVENOUS | Status: DC
  Administered 2017-10-11: 3400 mg via INTRAVENOUS
  Filled 2017-10-11: qty 68

## 2017-10-11 MED ORDER — DEXTROSE 5 % IV SOLN
50.0000 mg/m2 | Freq: Once | INTRAVENOUS | Status: AC
  Administered 2017-10-11: 85 mg via INTRAVENOUS
  Filled 2017-10-11: qty 17

## 2017-10-11 MED ORDER — PALONOSETRON HCL INJECTION 0.25 MG/5ML
0.2500 mg | Freq: Once | INTRAVENOUS | Status: AC
  Administered 2017-10-11: 0.25 mg via INTRAVENOUS

## 2017-10-11 MED ORDER — PALONOSETRON HCL INJECTION 0.25 MG/5ML
INTRAVENOUS | Status: AC
  Filled 2017-10-11: qty 5

## 2017-10-11 MED ORDER — DEXAMETHASONE SODIUM PHOSPHATE 10 MG/ML IJ SOLN
10.0000 mg | Freq: Once | INTRAMUSCULAR | Status: AC
  Administered 2017-10-11: 10 mg via INTRAVENOUS

## 2017-10-11 MED ORDER — DEXTROSE 5 % IV SOLN
400.0000 mg/m2 | Freq: Once | INTRAVENOUS | Status: AC
  Administered 2017-10-11: 680 mg via INTRAVENOUS
  Filled 2017-10-11: qty 34

## 2017-10-11 NOTE — Progress Notes (Signed)
[place]  [phone] [phone]  Clinic Follow Up Note   Patient Care Team: Darcus Austin, MD as PCP - General (Family Medicine) Arta Silence, MD as Consulting Physician (Gastroenterology) Truitt Merle, MD as Consulting Physician (Hematology).  Date of Service:  10/11/2017  CHIEF COMPLAINTS:  F/u for Pancreatic Cancer, stage III   Oncology History   Cancer Staging Pancreatic cancer [facility]) Staging form: Exocrine Pancreas, AJCC 8th Edition - Clinical stage from 06/13/2017: Stage III (cT4, cN0, cM0) - Signed by Truitt Merle, MD on 06/13/2017       Pancreatic cancer ([place])   06/08/2017 Imaging    06/08/2017 CT Abdomen IMPRESSION: Irregular solid hypoechoic mass within the pancreatic head, 2.6 x 2.2 cm with associated pancreatic ductal dilatation and pancreatic atrophy, most compatible with pancreatic cancer. 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  Diffuse colonic diverticulosis. Slight stranding around the distal descending colon may reflect early active diverticulitis.  Bilateral renal parapelvic cysts.  Aortic atherosclerosis.    06/13/2017 Initial Diagnosis    Pancreatic cancer ([place])    06/13/2017 Cancer Staging    Staging form: Exocrine Pancreas, AJCC 8th Edition - Clinical stage from 06/13/2017: Stage III (cT4, cN0, cM0) - Signed by Truitt Merle, MD on 06/13/2017    06/13/2017 Procedure    EUS by Dr. Paulita Fujita 06/13/17  IMPRESSION:  -There was no sign of significant pathology in the ampulla. - There was no evidence of significant pathology in the left lobe of the liver. - A mass was identified in the pancreatic head. Abutment SMV; invasion portal confluence; no adenopathy. Fine needle aspiration performed. If this is a pancreatic adenocarcinoma, it would be staged T4/N0/Mx by EUS.    06/13/2017 Initial Biopsy    Diagnosis 06/13/17 FINE NEEDLE  ASPIRATION, ENDOSCOPIC, PANCREAS HEAD (SPECIMEN 1 OF 1 COLLECTED 06/13/17): ADENOCARCINOMA. Preliminary Diagnosis Intraoperative Diagnosis: 1-3) ATYPICAL CELLS, ADDITIONAL MATERIAL REQUESTED. (NDK) 4-6 ADENOCARCINOMA. (NDK) Reported to Dr. Paulita Fujita on 06/13/17 @ 11:07am.     06/21/2017 Imaging    CT Chest without contrast IMPRESSION: 1. No findings suspicious for metastatic disease within the chest.  2. Tiny subpleural right lower lobe pulmonary nodule is nonspecific, but likely benign. This can be addressed on follow-up imaging.  3. Aortic Atherosclerosis (ICD10-I70.0).    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  The Hereditary Gene Panel offered by [facility] includes sequencing and/or deletion duplication testing of the following 47 genes: APC, ATM, AXIN2, BARD1, BMPR1A, BRCA1, BRCA2, BRIP1, CDH1, CDK4, CDKN2A (p14ARF), CDKN2A (p16INK4a), CHEK2, CTNNA1, DICER1, EPCAM (Deletion/duplication testing only), GREM1 (promoter region deletion/duplication testing only), KIT, MEN1, MLH1, MSH2, MSH3, MSH6, MUTYH, NBN, NF1, NHTL1, PALB2, PDGFRA, PMS2, POLD1, POLE, PTEN, RAD50, RAD51C, RAD51D, SDHB, SDHC, SDHD, SMAD4, SMARCA4. STK11, TP53, TSC1, TSC2, and VHL.  The following genes were evaluated for sequence changes only: SDHA and HOXB13 c.251G>A variant only. The report date is August 14, 2017.    09/24/2017 Progression    09/24/2017 CT CAP Pancreas: The mass involving the head of pancreas measures 2.5 x 3.8 by 3.1 cm. On the previous exam this measured 2.2 x 2.6 by 3.4 cm.    09/24/2017 Imaging    09/24/2017 CT CAP IMPRESSION: 1. There is been interval local progression of disease with increased size of  head of pancreas neoplasm. There is progressive vascular involvement with partial encasement of the celiac trunk and SMA. Continued encasement and marked narrowing of  the portal venous confluence. 2. No evidence for metastatic adenopathy within the abdomen, liver metastasis or pulmonary metastasis. 3. Aortic atherosclerosis and LAD coronary artery atherosclerotic calcifications. Aortic Atherosclerosis (ICD10-I70.0).      HISTORY OF PRESENTING ILLNESS: 06/14/17 Carly [name] 77 y.o. female is a here because of newly diagnosed Pancreatic Cancer. The patient was referred by Dr. Paulita Fujita. The patient presents to the clinic today accompanied by her 2 sons and her daughter-in-law.   She was having aching of her left abdominal starting 3 months ago after UTI. This pain comes and goes, which worsened at night. She denies issues with her appetite overall but she does not eat as much as she did due to the recent death of her husband. She denies any abdominal bloating. The color of urine has not become dark.   Today the patient notes she still has left abdominal pain. She notes she plans to see her grandson in [place] this weekend. She plans to go to the beach for 1-2 weeks in May, 2018. She prefers to be reached by her home phone and then her daughter-in-law.  Socially she lives by herself since her husband's passing in 12/2016. Her house is 1 level. She has support from her son and his wife. She has good family support. She has a stationary bike he rides 30 minutes a day and keeps track of her steps per day. She is very active and goes out often. 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   In the past the patient had a complete hysterectomy in early 1981 for bleeding. During her surgery she had cancer cells found and had a BSO. She had no following treatments. She had a pelvic rebuilt in 1990s due to prolapse. She had a yearly pap smear. She is diagnosed os type II DM but is not on any medication at this time.  She takes tramadol for the pain as needed, trazodone to help her sleep and takes XANAX for her anxiety. She has been  on this for years. She has been on Trazodone for 40 years to also help her anxiety. She takes Lipitor for her high cholesterol, not regularly. She takes HCTZ for her fluid retention which she takes as needed. She takes Lisinopril for her HTN. She takes Synthroid for her hypothyroidism.  Her maternal grandmother had breast cancer. Her maternal cousin had breast cancer and her paternal cousin had a blood disorder. She smoked for 10-15 years of 1/2-1 pack a day before stopping in 1980s.   On review of symptoms, pt notes left abdominal ache which she tried tylenol and she was recently given Tramadol. This helped her some. She denies abdominal bloating, dark urine or jaundice and denies dysuria.  CURRENT THERAPY: chemo FOLFIRINOX every 2 weeks started 07/19/17. Given her thrombocytopenia I reduce her dose starting with cycle 5.   INTERVAL HISTORY:   Carly [name] is here for follow-up. She is here with her family member. She is feeling well and denies pain, bleeding, or bruising. She is concerned about her disease progression. She tries to be careful about her DM, but had a lot of carbs this morning, and therefore, her BG is high at 240. She saw a dietician before.   MEDICAL HISTORY:  Past Medical History:  Diagnosis Date  . Allergic rhinitis  Skin Test 04-14-2009  . Asthma   . Cancer ([place]) 1981   adenocarcinoma of uterus  . Diabetes mellitus without complication ([place])    type II, no meds per pt  . Family history of breast cancer   . History of uterine cancer   . Hyperlipemia   . Hypertension   . Insomnia     SURGICAL HISTORY: Past Surgical History:  Procedure Laterality Date  . ABDOMINAL HYSTERECTOMY    . CARPAL TUNNEL RELEASE    . EUS N/A 06/13/2017   Procedure: FULL UPPER ENDOSCOPIC ULTRASOUND (EUS) RADIAL;  Surgeon: Arta Silence, MD;  Location: [facility];  Service: Endoscopy;  Laterality: N/A;  . I&D EXTREMITY  09/17/2011   Procedure: IRRIGATION AND DEBRIDEMENT EXTREMITY;   Surgeon: Roseanne Kaufman, MD;  Location: [facility];  Service: Orthopedics;  Laterality: Left;  with drain placement  . IR FLUORO GUIDE PORT INSERTION RIGHT  07/18/2017  . IR US GUIDE VASC ACCESS RIGHT  07/18/2017  . NASAL SEPTOPLASTY W/ TURBINOPLASTY    . pelvic floor rebuild    . TONSILLECTOMY    . TOTAL ABDOMINAL HYSTERECTOMY W/ BILATERAL SALPINGOOPHORECTOMY      SOCIAL HISTORY: Social History   Socioeconomic History  . Marital status: Widowed    Spouse name: Not on file  . Number of children: Not on file  . Years of education: Not on file  . Highest education level: Not on file  Occupational History  . Not on file  Social Needs  . Financial resource strain: Not on file  . Food insecurity:    Worry: Not on file    Inability: Not on file  . Transportation needs:    Medical: Not on file    Non-medical: Not on file  Tobacco Use  . Smoking status: Former Smoker    Packs/day: 0.50    Years: 15.00    Pack years: 7.50    Types: Cigarettes    Last attempt to quit: 02/27/1978    Years since quitting: 39.6  . Smokeless tobacco: Never Used  Substance and Sexual Activity  . Alcohol use: Yes    Alcohol/week: 0.0 standard drinks    Comment: occ- glass of wine  . Drug use: No  . Sexual activity: Yes    Comment: 1st intercouse- 15, partners- 1  Lifestyle  . Physical activity:    Days per week: Not on file    Minutes per session: Not on file  . Stress: Not on file  Relationships  . Social connections:    Talks on phone: Not on file    Gets together: Not on file    Attends religious service: Not on file    Active member of club or organization: Not on file    Attends meetings of clubs or organizations: Not on file    Relationship status: Not on file  . Intimate partner violence:    Fear of current or ex partner: Not on file    Emotionally abused: Not on file    Physically abused: Not on file    Forced sexual activity: Not on file  Other Topics Concern  . Not on file  Social  History Narrative  . Not on file    FAMILY HISTORY: Family History  Problem Relation Age of Onset  . Stroke Mother 14  . Heart attack Father 80  . Dementia Brother   . Hypertension Brother   . Skin cancer Brother   . Breast cancer Maternal Grandmother  dx >50  . Breast cancer Cousin        mat first cousin, dx in her 18s  . Cancer Cousin        oral cancer  . Breast cancer Cousin        mat first cousin, dx in her 64s  . Heart disease Maternal Uncle   . Other Maternal Grandfather        suicide  . Heart disease Paternal Grandmother     ALLERGIES:  is allergic to cephalexin and nabumetone.  MEDICATIONS:  Current Outpatient Medications  Medication Sig Dispense Refill  . ALPRAZolam (XANAX) 0.5 MG tablet Take 1 tablet (0.5 mg total) by mouth at bedtime as needed for anxiety. 90 tablet 0  . Ascorbic Acid (VITAMIN C) 500 MG PACK Take 100 mg by mouth daily.    Marland Kitchen atorvastatin (LIPITOR) 10 MG tablet Take 10 mg by mouth at bedtime.     Marland Kitchen CALCIUM PO Take 1 tablet by mouth daily.     . Cholecalciferol (VITAMIN D) 2000 units CAPS Take by mouth.    . Cyanocobalamin (VITAMIN B-12 PO) Take 1 tablet by mouth daily.     . diphenoxylate-atropine (LOMOTIL) 2.5-0.025 MG tablet Take 1-2 tablets by mouth 4 (four) times daily as needed for diarrhea or loose stools. 60 tablet 1  . hydrochlorothiazide (HYDRODIURIL) 25 MG tablet Take 1 tablet by mouth daily as needed.     Marland Kitchen HYDROcodone-acetaminophen (NORCO) 5-325 MG tablet Take 1 tablet by mouth every 6 (six) hours as needed for moderate pain. 40 tablet 0  . hyoscyamine (LEVBID) 0.375 MG 12 hr tablet Take 0.375 mg by mouth every 12 (twelve) hours as needed for cramping.     Marland Kitchen KLOR-CON M20 20 MEQ tablet TAKE 1 TABLET BY MOUTH TWICE A DAY 60 tablet 1  . latanoprost (XALATAN) 0.005 % ophthalmic solution 1 drop at bedtime.    Marland Kitchen levothyroxine (SYNTHROID, LEVOTHROID) 25 MCG tablet Take 25 mcg by mouth daily before breakfast.     .  lidocaine-prilocaine (EMLA) cream Apply to affected area once 30 g 3  . magic mouthwash w/lidocaine SOLN Take 5 mLs by mouth 3 (three) times daily as needed for mouth pain. 240 mL 0  . nitrofurantoin, macrocrystal-monohydrate, (MACROBID) 100 MG capsule Take 1 capsule (100 mg total) by mouth 2 (two) times daily. 10 capsule 0  . Omega-3 Fatty Acids (FISH OIL PO) Take 1 capsule by mouth daily.     Marland Kitchen omeprazole (PRILOSEC) 20 MG capsule Take 1 tablet by mouth daily.    . ondansetron (ZOFRAN) 8 MG tablet Take 1 tablet (8 mg total) by mouth 2 (two) times daily as needed for refractory nausea / vomiting. Start on day 3 after chemotherapy. 30 tablet 1  . ONETOUCH DELICA LANCETS 26R MISC USE TO TEST YOUR BLOOD SUGAR ONCE A DAY FASTING AND 2 HRS AFTER A MEAL AS NEEDED  3  . ONETOUCH VERIO test strip USE TO TEST YOUR BLOOD SUGAR ONCE A DAY FASTING & 2 HRS AFTER A MEAL AS NEEDED  3  . prochlorperazine (COMPAZINE) 10 MG tablet Take 1 tablet (10 mg total) by mouth every 6 (six) hours as needed (NAUSEA). 30 tablet 2  . traMADol (ULTRAM) 50 MG tablet Take 1-2 tablets every 6 hours as needed for pain 180 tablet 0  . traZODone (DESYREL) 100 MG tablet Take 1 tablet (100 mg total) by mouth at bedtime. (Patient taking differently: Take 50 mg by mouth at bedtime. ) 90 tablet  3  . metFORMIN (GLUCOPHAGE) 500 MG tablet Take 1 tablet (500 mg total) by mouth daily with breakfast. 30 tablet 1   No current facility-administered medications for this visit.     REVIEW OF SYSTEMS:  Constitutional: Denies fevers, chills or abnormal night sweats (+) good appetite (+) pain improving Eyes: Denies double vision or watery eyes, blurred vision Ears, nose, mouth, throat, and face: Denies mucositis or sore throat  Respiratory: Denies cough, dyspnea or wheezes Cardiovascular: Denies palpitation, chest discomfort  Gastrointestinal:  Denies heartburn (+) intermittent diarrhea   Urinary: Denies frequency, urgency, burning, or  incontinence.  Skin: Denies abnormal skin rashes (+) complete hair loss Lymphatics: Denies new lymphadenopathy or easy bruising Neurological:Denies numbness, tingling or new weaknesses MSK: No myalgia Behavioral/Psych: Mood is stable, no new changes All other systems were reviewed with the patient and are negative.  PHYSICAL EXAMINATION: ECOG PERFORMANCE STATUS: 1 - Symptomatic but completely ambulatory  Vitals:   10/11/17 0943  BP: 134/72  Pulse: 90  Resp: 18  Temp: 98 F (36.7 C)  SpO2: 100%   Filed Weights   10/11/17 0943  Weight: 138 lb 6.4 oz (62.8 kg)    GENERAL:alert, no distress and comfortable (+) hair loss (+) weight loss SKIN: skin color, texture, turgor are normal, no rashes or significant lesions EYES: normal, conjunctiva are pink and non-injected, sclera clear OROPHARYNX:no exudate, no erythema and lips, buccal mucosa, and tongue normal  NECK: supple, thyroid normal size, non-tender, without nodularity LYMPH:  no palpable lymphadenopathy in the cervical, axillary or inguinal LUNGS: clear to auscultation and percussion with normal breathing effort HEART: regular rhythm and no murmurs and no lower extremity edema  ABDOMEN:abdomen soft, non-tender and normal bowel sounds Musculoskeletal:no cyanosis of digits and no clubbing  PSYCH: alert & oriented x 3 with fluent speech NEURO: no focal motor/sensory deficits  LABORATORY DATA:  I have reviewed the data as listed CBC Latest Ref Rng & Units 10/11/2017 09/27/2017 09/13/2017  WBC 3.9 - 10.3 K/uL 5.3 7.4 13.1(H)  Hemoglobin 11.6 - 15.9 g/dL 11.1(L) 9.1(L) 9.7(L)  Hematocrit 34.8 - 46.6 % 34.0(L) 26.8(L) 28.7(L)  Platelets 145 - 400 K/uL 122(L) 61(L) 93(L)    CMP Latest Ref Rng & Units 10/11/2017 09/27/2017 09/13/2017  Glucose 70 - 99 mg/dL 240(H) 178(H) 144(H)  BUN 8 - 23 mg/dL '13 13 15  ' Creatinine 0.44 - 1.00 mg/dL 0.74 0.73 0.71  Sodium 135 - 145 mmol/L 139 141 141  Potassium 3.5 - 5.1 mmol/L 4.1 3.5 3.0(LL)    Chloride 98 - 111 mmol/L 106 107 107  CO2 22 - 32 mmol/L '23 25 27  ' Calcium 8.9 - 10.3 mg/dL 9.1 9.2 9.1  Total Protein 6.5 - 8.1 g/dL 6.2(L) 5.4(L) 5.2(L)  Total Bilirubin 0.3 - 1.2 mg/dL 0.6 0.5 0.5  Alkaline Phos 38 - 126 U/L 169(H) 136(H) 126  AST 15 - 41 U/L '29 22 24  ' ALT 0 - 44 U/L 35 20 23   Tumor Marker CA 19-9  07/19/17: 1436 08/16/17 606  09/13/17: 170  10/11/17: Pending  PATHOLOGY  08/08/2017 Molecular Pathology   Diagnosis 06/13/17 FINE NEEDLE ASPIRATION, ENDOSCOPIC, PANCREAS HEAD (SPECIMEN 1 OF 1 COLLECTED 06/13/17): ADENOCARCINOMA. Preliminary Diagnosis Intraoperative Diagnosis: 1-3) ATYPICAL CELLS, ADDITIONAL MATERIAL REQUESTED. (NDK) 4-6 ADENOCARCINOMA. (NDK) Reported to Dr. Paulita Fujita on 06/13/17 @ 11:07am.   PROCEDURES  EUS by Dr. Paulita Fujita 06/13/17  IMPRESSION:  -There was no sign of significant pathology in the ampulla. - There was no evidence of significant pathology in the  left lobe of the liver. - A mass was identified in the pancreatic head. Abutment SMV; invasion portal confluence; no adenopathy. Fine needle aspiration performed. If this is a pancreatic adenocarcinoma, it would be staged T4/N0/Mx by EUS.   RADIOGRAPHIC STUDIES: I have personally reviewed the radiological images as listed and agreed with the findings in the report.  09/24/2017 CT CAP IMPRESSION: 1. There is been interval local progression of disease with increased size of head of pancreas neoplasm. There is progressive vascular involvement with partial encasement of the celiac trunk and SMA. Continued encasement and marked narrowing of the portal venous confluence. 2. No evidence for metastatic adenopathy within the abdomen, liver metastasis or pulmonary metastasis. 3. Aortic atherosclerosis and LAD coronary artery atherosclerotic calcifications. Aortic Atherosclerosis (ICD10-I70.0).  06/22/2017 CT CAP IMPRESSION: 1. No findings suspicious for metastatic disease within the chest. 2.  Tiny subpleural right lower lobe pulmonary nodule is nonspecific, but likely benign. This can be addressed on follow-up imaging. 3.  Aortic Atherosclerosis (ICD10-I70.0).   Ct Chest W Contrast  Result Date: 09/24/2017 CLINICAL DATA:  Restaging pancreas adenocarcinoma EXAM: CT CHEST, ABDOMEN, AND PELVIS WITH CONTRAST TECHNIQUE: Multidetector CT imaging of the chest, abdomen and pelvis was performed following the standard protocol during bolus administration of intravenous contrast. CONTRAST:  146m ISOVUE-300 IOPAMIDOL (ISOVUE-300) INJECTION 61% COMPARISON:  06/21/2017 FINDINGS: CT CHEST FINDINGS Cardiovascular: The heart size is mildly enlarged. No pericardial effusion identified. Aortic atherosclerosis. Calcifications within the LAD coronary arteries noted. Mediastinum/Nodes: Normal appearance of the thyroid gland. The trachea appears patent and is midline. Normal appearance of the esophagus. No enlarged mediastinal or hilar lymph nodes. Lungs/Pleura: No pleural effusion. No airspace consolidation, atelectasis or pneumothorax identified. Previously noted 4 mm subpleural nodule in the right lower lobe is no longer visualized. Musculoskeletal: Spondylosis noted within the thoracic spine. No aggressive lytic or sclerotic bone lesions. CT ABDOMEN PELVIS FINDINGS Hepatobiliary: No suspicious liver abnormality. The gallbladder appears normal. No biliary dilatation. Pancreas: The mass involving the head of pancreas measures 2.5 x 3.8 by 3.1 cm. On the previous exam this measured 2.2 x 2.6 by 3.4 cm. There is associated atrophy of the pancreatic body and tail with dilatation of the main duct. There has been progressive medial and posterior extension of tumor which partially encases the SMA and celiac trunk, image 40/2 and image 44/2. Encasement and narrowing of the portal venous confluence appears similar to previous exam. Spleen: Normal in size without focal abnormality. Adrenals/Urinary Tract: The adrenal glands  appear normal. Bilateral renal sinus cysts noted. No kidney mass or hydronephrosis. The urinary bladder is unremarkable. Stomach/Bowel: Stomach is normal. No abnormal small bowel dilatation. No pathologic dilatation of the colon. Scattered distal colonic diverticula noted without acute inflammation Vascular/Lymphatic: Aortic atherosclerosis. No aneurysm. The main portal vein and intrahepatic branches are patent. The hepatic veins are patent. No abdominal adenopathy. No pelvic or inguinal adenopathy. Reproductive: Status post hysterectomy. No adnexal masses. Other: No ascites.  No peritoneal nodularity or mass identified. Musculoskeletal: Mild spondylosis identified within the lumbar spine. IMPRESSION: 1. There is been interval local progression of disease with increased size of head of pancreas neoplasm. There is progressive vascular involvement with partial encasement of the celiac trunk and SMA. Continued encasement and marked narrowing of the portal venous confluence. 2. No evidence for metastatic adenopathy within the abdomen, liver metastasis or pulmonary metastasis. 3. Aortic atherosclerosis and LAD coronary artery atherosclerotic calcifications. Aortic Atherosclerosis (ICD10-I70.0). Electronically Signed   By: TKerby MoorsM.D.   On: 09/24/2017  14:22   Ct Abdomen Pelvis W Contrast  Result Date: 09/24/2017 CLINICAL DATA:  Restaging pancreas adenocarcinoma EXAM: CT CHEST, ABDOMEN, AND PELVIS WITH CONTRAST TECHNIQUE: Multidetector CT imaging of the chest, abdomen and pelvis was performed following the standard protocol during bolus administration of intravenous contrast. CONTRAST:  143m ISOVUE-300 IOPAMIDOL (ISOVUE-300) INJECTION 61% COMPARISON:  06/21/2017 FINDINGS: CT CHEST FINDINGS Cardiovascular: The heart size is mildly enlarged. No pericardial effusion identified. Aortic atherosclerosis. Calcifications within the LAD coronary arteries noted. Mediastinum/Nodes: Normal appearance of the thyroid gland.  The trachea appears patent and is midline. Normal appearance of the esophagus. No enlarged mediastinal or hilar lymph nodes. Lungs/Pleura: No pleural effusion. No airspace consolidation, atelectasis or pneumothorax identified. Previously noted 4 mm subpleural nodule in the right lower lobe is no longer visualized. Musculoskeletal: Spondylosis noted within the thoracic spine. No aggressive lytic or sclerotic bone lesions. CT ABDOMEN PELVIS FINDINGS Hepatobiliary: No suspicious liver abnormality. The gallbladder appears normal. No biliary dilatation. Pancreas: The mass involving the head of pancreas measures 2.5 x 3.8 by 3.1 cm. On the previous exam this measured 2.2 x 2.6 by 3.4 cm. There is associated atrophy of the pancreatic body and tail with dilatation of the main duct. There has been progressive medial and posterior extension of tumor which partially encases the SMA and celiac trunk, image 40/2 and image 44/2. Encasement and narrowing of the portal venous confluence appears similar to previous exam. Spleen: Normal in size without focal abnormality. Adrenals/Urinary Tract: The adrenal glands appear normal. Bilateral renal sinus cysts noted. No kidney mass or hydronephrosis. The urinary bladder is unremarkable. Stomach/Bowel: Stomach is normal. No abnormal small bowel dilatation. No pathologic dilatation of the colon. Scattered distal colonic diverticula noted without acute inflammation Vascular/Lymphatic: Aortic atherosclerosis. No aneurysm. The main portal vein and intrahepatic branches are patent. The hepatic veins are patent. No abdominal adenopathy. No pelvic or inguinal adenopathy. Reproductive: Status post hysterectomy. No adnexal masses. Other: No ascites.  No peritoneal nodularity or mass identified. Musculoskeletal: Mild spondylosis identified within the lumbar spine. IMPRESSION: 1. There is been interval local progression of disease with increased size of head of pancreas neoplasm. There is progressive  vascular involvement with partial encasement of the celiac trunk and SMA. Continued encasement and marked narrowing of the portal venous confluence. 2. No evidence for metastatic adenopathy within the abdomen, liver metastasis or pulmonary metastasis. 3. Aortic atherosclerosis and LAD coronary artery atherosclerotic calcifications. Aortic Atherosclerosis (ICD10-I70.0). Electronically Signed   By: TKerby MoorsM.D.   On: 09/24/2017 14:22    ASSESSMENT & PLAN:  PDAKOTA STANGLis a 77y.o. Caucasian female with a history of hypothyroidism, GERD, Anxiety, osteopenia, Glaucoma, HTN, hypercholesterolemia, and H/o Uterine Cancer, presented with intermittent upper abdominal pain    1. 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  -She has locally advanced Adenocarcinoma of Pancreatic Head.  The EUS showed a 3.0 x 3.0 cm mass in the pancreatic head, with sonographic evidence suggesting invasion into the superior mesenteric artery, the portal vein,, the SMV, and splenic vein.  Unfortunately this is unresectable tumor, due to the invasion of SMA. -Staging scan was negative for metastasis. -Her case was discussed in our GI tumor board. Our surgeon Dr. BBarry Dienesconcurs that her pancreatic cancer is not resectable. -I previously recommended induction chemo  for 4-6 months followed by radiation if no disease progression. We also discussed maintenance chemotherapy if she does not want to radiation. -The goal of therapy is palliative, we are not able to cure her cancer -She started FOLFIRINOX q2weeks on 07/19/17. She tolerated moderately well with some diarrhea and fatigue.  -She tolerated full dose cycle 2 and 3  Overall well. She developed Thrombocytopenia after cycle 5 and I reduced  dose.  -She is tolerating treatment well overall, her pain has improved, her tumor marker CA 19.9 has decreased significantly, indicating likely good response to treatment. -CT scan on 7/30 showed The mass involving the head of pancreas measures 2.5 x 3.8 by 3.1 cm, slightly bigger than before ( 2.2 x 2.6 by 3.4 cm).  No other evidence of metastasis.  However her previous scan was done one month before she started chemo, and we know imaging measurement of the pancreatic tumor sometimes could be not very accurate.  Review her case in our GI tumor board, given her clinical response and improvement on CA19.9, we recommend her to continue current chemo. -will repeat scan in 2-3 month for close f/u  -Due to her significant thrombocytopenia (platelet count 122K today, last chemo 4 weeks ago), will continue chemo today with reduce dose further. -I encouraged her to eat more and increase her nutritional supplements and watch for worsened leg swelling or pain as chemo can increase her risk for blood clots.  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 She should reduce sugary fruits and high carb meets and increase vegetable.  -She was previously advised to continue to exercise as tolerable and check her Blood glucose at home in the morning and during the day. -Her BG is high at 240 today. She said that she had a high carb breakfast. I advised her to eat less carbs and measure her BG frequently. -I prescribed Metformin 575m once daily today.  Potential side effects, especially diarrhea, reviewed with patient.  She agrees to proceed. -Check her HbA1c on next visit   3. Diarrhea, Abdominal pain and weight loss -She has previously met with our dietician BPamala HurryNef.  I previously encouraged her to take a nutritional supplement -I previously reviewed her pain medication use. She can use Tramadol for moderate pain and hydrocodone for worse pain.  -She previously increased her Imodium and I prescribed Lomotil on 07/26/17 to take if imodium is not enough.   -Her intermittent diarrhea controlled with lomotil. Her pain is mild and nearly resolved. Weight is trending down, I encouraged her to increase premier supplements.   4. H/o Uterine Cancer, Stage I, diagnosed in 1981 -She had a total hysterectomy and BSO in 1981. She had no following treatments -Followed by her Gynecologist with yearly pap smears.   5. Genetic Testing  -Given her history of uterine cancer and family history of breast cancer she is eligible for genetic testing. She expressed interest and. I previously sent genetic counseling referral. -genetic test was negative   6. Social Support -She lives alone but has good familial support.  -I previously recommended that she has someone check on her weekly especially when on chemo treatment.  -I previously sent a SW referral and she has previously met  with them  7. Hypokalemia   - likely related to her diarrhea  -I previously prescribed potassium chloride 20 mEq twice daily for 5 days, then to continue once daily -We will continue close monitoring - She increased to 20 MEQ twice daily at home -K normal today   8. HTN, Hypothyroidism  -She is on HCTZ as needed and Lisinopril daily  -She is on Levothyroxine daily -I discussed with Chemo her BP may becomes low. I recommended if she develops this or dizziness I suggest she hold HCTZ first and continue to monitor her BP at home.  -Continue to Follow up with PCP for management and medication.  - She is currently taking half dose of her BP medication because of low morning BP readings at home. -I advised her to continue monitoring with possibility of stop BP medication if BP continues to drop or is  causing symptoms.  9.Goal of care discussion  -We again discussed the incurable nature of her cancer, and the overall poor prognosis, especially if she does not have good response to chemotherapy or progress on chemo -The patient understands the goal of care is palliative. -she is full code now   PLAN:  -Lab reviewed, due to significant thrombocytopenia, will continue chemo with reduce dose  -I prescribed Metformin 568m once daily today -Scan in October 2019 and f/u after -f/u in 2 weeks with chemo    All questions were answered. The patient knows to call the clinic with any problems, questions or concerns.  I spent 20 minutes counseling the patient face to face. The total time spent in the appointment was 25 minutes and more than 50% was on counseling.  IDierdre SearlesDweik am acting as scribe for Dr. YTruitt Merle  I have reviewed the above documentation for accuracy and completeness, and I agree with the above.      YTruitt Merle MD 10/11/2017

## 2017-10-11 NOTE — Telephone Encounter (Signed)
Gave patient avs and calendar of upcoming appts.  °

## 2017-10-11 NOTE — Patient Instructions (Signed)
[facility] Discharge Instructions for Patients Receiving Chemotherapy  Today you received the following chemotherapy agents Oxaliplatin, Irinotecan, Leucovorin, and 5FU  To help prevent nausea and vomiting after your treatment, we encourage you to take your nausea medication as directed   If you develop nausea and vomiting that is not controlled by your nausea medication, call the clinic.   BELOW ARE SYMPTOMS THAT SHOULD BE REPORTED IMMEDIATELY:  *FEVER GREATER THAN 100.5 F  *CHILLS WITH OR WITHOUT FEVER  NAUSEA AND VOMITING THAT IS NOT CONTROLLED WITH YOUR NAUSEA MEDICATION  *UNUSUAL SHORTNESS OF BREATH  *UNUSUAL BRUISING OR BLEEDING  TENDERNESS IN MOUTH AND THROAT WITH OR WITHOUT PRESENCE OF ULCERS  *URINARY PROBLEMS  *BOWEL PROBLEMS  UNUSUAL RASH Items with * indicate a potential emergency and should be followed up as soon as possible.  Feel free to call the clinic should you have any questions or concerns. The clinic phone number is [phone].  Please show the CHEMO ALERT CARD at check-in to the Emergency Department and triage nurse.   

## 2017-10-12 LAB — CANCER ANTIGEN 19-9: CAN 19-9: 95 U/mL — AB (ref 0–35)

## 2017-10-13 ENCOUNTER — Inpatient Hospital Stay: Payer: Medicare Other

## 2017-10-13 VITALS — BP 106/58 | HR 84 | Temp 98.0°F | Resp 18

## 2017-10-13 DIAGNOSIS — C250 Malignant neoplasm of head of pancreas: Secondary | ICD-10-CM

## 2017-10-13 DIAGNOSIS — E876 Hypokalemia: Secondary | ICD-10-CM | POA: Diagnosis not present

## 2017-10-13 DIAGNOSIS — D6959 Other secondary thrombocytopenia: Secondary | ICD-10-CM | POA: Diagnosis not present

## 2017-10-13 DIAGNOSIS — Z7189 Other specified counseling: Secondary | ICD-10-CM

## 2017-10-13 DIAGNOSIS — Z5111 Encounter for antineoplastic chemotherapy: Secondary | ICD-10-CM | POA: Diagnosis not present

## 2017-10-13 DIAGNOSIS — Z95828 Presence of other vascular implants and grafts: Secondary | ICD-10-CM

## 2017-10-13 DIAGNOSIS — E119 Type 2 diabetes mellitus without complications: Secondary | ICD-10-CM | POA: Diagnosis not present

## 2017-10-13 DIAGNOSIS — Z5189 Encounter for other specified aftercare: Secondary | ICD-10-CM | POA: Diagnosis not present

## 2017-10-13 MED ORDER — HEPARIN SOD (PORK) LOCK FLUSH 100 UNIT/ML IV SOLN
500.0000 [IU] | Freq: Once | INTRAVENOUS | Status: AC | PRN
  Administered 2017-10-13: 500 [IU]
  Filled 2017-10-13: qty 5

## 2017-10-13 MED ORDER — PEGFILGRASTIM INJECTION 6 MG/0.6ML ~~LOC~~
6.0000 mg | PREFILLED_SYRINGE | Freq: Once | SUBCUTANEOUS | Status: AC
  Administered 2017-10-13: 6 mg via SUBCUTANEOUS

## 2017-10-13 MED ORDER — PEGFILGRASTIM INJECTION 6 MG/0.6ML ~~LOC~~
PREFILLED_SYRINGE | SUBCUTANEOUS | Status: AC
  Filled 2017-10-13: qty 0.6

## 2017-10-13 MED ORDER — SODIUM CHLORIDE 0.9% FLUSH
10.0000 mL | INTRAVENOUS | Status: DC | PRN
  Administered 2017-10-13: 10 mL
  Filled 2017-10-13: qty 10

## 2017-10-13 NOTE — Patient Instructions (Signed)
Central Line, Adult A central line is a thin, flexible tube (catheter) that is put in your vein. It can be used to:  Take blood for lab tests.  Give you medicine.  Give you food and nutrients.  The procedure may vary among doctors and hospitals. Follow these instructions at home: Caring for the tube  Follow instructions from your doctor about: ? Flushing the tube with saline solution. ? Cleaning the tube and the area around it.  Only flush with clean (sterile) supplies. The supplies should be from your doctor, a pharmacy, or another place that your doctor recommends.  Before you flush the tube or clean the area around the tube: ? Wash your hands with soap and water. If you cannot use soap and water, use hand sanitizer. ? Clean the central line hub with rubbing alcohol. Caring for your skin  Keep the area where the tube was put in clean and dry.  Every day, and when changing the bandage, check the skin around the central line for: ? Redness, swelling, or pain. ? Fluid or blood. ? Warmth. ? Pus. ? A bad smell. General instructions  Keep the tube clamped, unless it is being used.  Keep your supplies in a clean, dry location.  If you or someone else accidentally pulls on the tube, make sure: ? The bandage (dressing) is okay. ? There is no bleeding. ? The tube has not been pulled out.  Do not use scissors or sharp objects near the tube.  Do not swim or let the tube soak in a tub.  Ask your doctor what activities are safe for you. Your doctor may tell you not to lift anything or move your arm too much.  Take over-the-counter and prescription medicines only as told by your doctor.  Change bandages as told by your doctor.  Keep your bandage dry. If a bandage gets wet, have it changed right away.  Keep all follow-up visits as told by your doctor. This is important. Throwing away supplies  Throw away any syringes in a trash (disposal) container that is only for sharp  items (sharps container). You can buy a sharps container from a pharmacy, or you can make one by using an empty hard plastic bottle with a cover.  Place any used bandages or infusion bags into a plastic bag. Throw that bag in the trash. Contact a doctor if:  You have any of these where the tube was put in: ? Redness, swelling, or pain. ? Fluid or blood. ? A warm feeling. ? Pus or a bad smell. Get help right away if:  You have: ? A fever. ? Chills. ? Trouble getting enough air (shortness of breath). ? Trouble breathing. ? Pain in your chest. ? Swelling in your neck, face, chest, or arm.  You are coughing.  You feel your heart beating fast or skipping beats.  You feel dizzy or you pass out (faint).  There are red lines coming from where the tube was put in.  The area where the tube was put in is bleeding and the bleeding will not stop.  Your tube is hard to flush.  You do not get a blood return from the tube.  The tube gets loose or comes out.  The tube has a hole or a tear.  The tube leaks. Summary  A central line is a thin, flexible tube (catheter) that is put in your vein. It can be used to take blood for lab tests or to   give you medicine.  Follow instructions from your doctor about flushing and cleaning the tube.  Keep the area where the tube was put in clean and dry.  Ask your doctor what activities are safe for you. This information is not intended to replace advice given to you by your health care provider. Make sure you discuss any questions you have with your health care provider. Document Released: 01/31/2012 Document Revised: 03/02/2016 Document Reviewed: 03/02/2016 Elsevier Interactive Patient Education  2017 [employer]   Pegfilgrastim injection What is this medicine? PEGFILGRASTIM (PEG fil gra stim) is a long-acting granulocyte colony-stimulating factor that stimulates the growth of neutrophils, a type of white blood cell important in the body's  fight against infection. It is used to reduce the incidence of fever and infection in patients with certain types of cancer who are receiving chemotherapy that affects the bone marrow, and to increase survival after being exposed to high doses of radiation. This medicine may be used for other purposes; ask your health care provider or pharmacist if you have questions. COMMON BRAND NAME(S): Neulasta What should I tell my health care provider before I take this medicine? They need to know if you have any of these conditions: -kidney disease -latex allergy -ongoing radiation therapy -sickle cell disease -skin reactions to acrylic adhesives (On-Body Injector only) -an unusual or allergic reaction to pegfilgrastim, filgrastim, other medicines, foods, dyes, or preservatives -pregnant or trying to get pregnant -breast-feeding How should I use this medicine? This medicine is for injection under the skin. If you get this medicine at home, you will be taught how to prepare and give the pre-filled syringe or how to use the On-body Injector. Refer to the patient Instructions for Use for detailed instructions. Use exactly as directed. Tell your healthcare provider immediately if you suspect that the On-body Injector may not have performed as intended or if you suspect the use of the On-body Injector resulted in a missed or partial dose. It is important that you put your used needles and syringes in a special sharps container. Do not put them in a trash can. If you do not have a sharps container, call your pharmacist or healthcare provider to get one. Talk to your pediatrician regarding the use of this medicine in children. While this drug may be prescribed for selected conditions, precautions do apply. Overdosage: If you think you have taken too much of this medicine contact a poison control center or emergency room at once. NOTE: This medicine is only for you. Do not share this medicine with others. What if I  miss a dose? It is important not to miss your dose. Call your doctor or health care professional if you miss your dose. If you miss a dose due to an On-body Injector failure or leakage, a new dose should be administered as soon as possible using a single prefilled syringe for manual use. What may interact with this medicine? Interactions have not been studied. Give your health care provider a list of all the medicines, herbs, non-prescription drugs, or dietary supplements you use. Also tell them if you smoke, drink alcohol, or use illegal drugs. Some items may interact with your medicine. This list may not describe all possible interactions. Give your health care provider a list of all the medicines, herbs, non-prescription drugs, or dietary supplements you use. Also tell them if you smoke, drink alcohol, or use illegal drugs. Some items may interact with your medicine. What should I watch for while using this medicine?   You may need blood work done while you are taking this medicine. If you are going to need a MRI, CT scan, or other procedure, tell your doctor that you are using this medicine (On-Body Injector only). What side effects may I notice from receiving this medicine? Side effects that you should report to your doctor or health care professional as soon as possible: -allergic reactions like skin rash, itching or hives, swelling of the face, lips, or tongue -dizziness -fever -pain, redness, or irritation at site where injected -pinpoint red spots on the skin -red or dark-brown urine -shortness of breath or breathing problems -stomach or side pain, or pain at the shoulder -swelling -tiredness -trouble passing urine or change in the amount of urine Side effects that usually do not require medical attention (report to your doctor or health care professional if they continue or are bothersome): -bone pain -muscle pain This list may not describe all possible side effects. Call your doctor  for medical advice about side effects. You may report side effects to FDA at 1-800-FDA-1088. Where should I keep my medicine? Keep out of the reach of children. Store pre-filled syringes in a refrigerator between 2 and 8 degrees C (36 and 46 degrees F). Do not freeze. Keep in carton to protect from light. Throw away this medicine if it is left out of the refrigerator for more than 48 hours. Throw away any unused medicine after the expiration date. NOTE: This sheet is a summary. It may not cover all possible information. If you have questions about this medicine, talk to your doctor, pharmacist, or health care provider.  2018 Elsevier/Gold Standard (2016-02-10 12:58:03)  

## 2017-10-15 DIAGNOSIS — H401132 Primary open-angle glaucoma, bilateral, moderate stage: Secondary | ICD-10-CM | POA: Diagnosis not present

## 2017-10-24 ENCOUNTER — Other Ambulatory Visit: Payer: Medicare Other

## 2017-10-24 ENCOUNTER — Ambulatory Visit: Payer: Medicare Other

## 2017-10-24 ENCOUNTER — Ambulatory Visit: Payer: Medicare Other | Admitting: Hematology

## 2017-10-24 NOTE — Progress Notes (Signed)
[place]  [phone]) [phone] [phone]  Clinic Follow Up Note   Patient Care Team: Darcus Austin, MD as PCP - General (Family Medicine) Arta Silence, MD as Consulting Physician (Gastroenterology) Truitt Merle, MD as Consulting Physician (Hematology).  Date of Service:  10/25/2017  CHIEF COMPLAINTS:  F/u for Pancreatic Cancer, stage III   Oncology History   Cancer Staging Pancreatic cancer [facility]) Staging form: Exocrine Pancreas, AJCC 8th Edition - Clinical stage from 06/13/2017: Stage III (cT4, cN0, cM0) - Signed by Truitt Merle, MD on 06/13/2017       Pancreatic cancer ([place])   06/08/2017 Imaging    06/08/2017 CT Abdomen IMPRESSION: Irregular solid hypoechoic mass within the pancreatic head, 2.6 x 2.2 cm with associated pancreatic ductal dilatation and pancreatic atrophy, most compatible with pancreatic cancer. 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  Diffuse colonic diverticulosis. Slight stranding around the distal descending colon may reflect early active diverticulitis.  Bilateral renal parapelvic cysts.  Aortic atherosclerosis.    06/13/2017 Initial Diagnosis    Pancreatic cancer ([place])    06/13/2017 Cancer Staging    Staging form: Exocrine Pancreas, AJCC 8th Edition - Clinical stage from 06/13/2017: Stage III (cT4, cN0, cM0) - Signed by Truitt Merle, MD on 06/13/2017    06/13/2017 Procedure    EUS by Dr. Paulita Fujita 06/13/17  IMPRESSION:  -There was no sign of significant pathology in the ampulla. - There was no evidence of significant pathology in the left lobe of the liver. - A mass was identified in the pancreatic head. Abutment SMV; invasion portal confluence; no adenopathy. Fine needle aspiration performed. If this is a pancreatic adenocarcinoma, it would be staged T4/N0/Mx by EUS.    06/13/2017 Initial Biopsy    Diagnosis 06/13/17 FINE NEEDLE  ASPIRATION, ENDOSCOPIC, PANCREAS HEAD (SPECIMEN 1 OF 1 COLLECTED 06/13/17): ADENOCARCINOMA. Preliminary Diagnosis Intraoperative Diagnosis: 1-3) ATYPICAL CELLS, ADDITIONAL MATERIAL REQUESTED. (NDK) 4-6 ADENOCARCINOMA. (NDK) Reported to Dr. Paulita Fujita on 06/13/17 @ 11:07am.     06/21/2017 Imaging    CT Chest without contrast IMPRESSION: 1. No findings suspicious for metastatic disease within the chest.  2. Tiny subpleural right lower lobe pulmonary nodule is nonspecific, but likely benign. This can be addressed on follow-up imaging.  3. Aortic Atherosclerosis (ICD10-I70.0).    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  The Hereditary Gene Panel offered by [facility] includes sequencing and/or deletion duplication testing of the following 47 genes: APC, ATM, AXIN2, BARD1, BMPR1A, BRCA1, BRCA2, BRIP1, CDH1, CDK4, CDKN2A (p14ARF), CDKN2A (p16INK4a), CHEK2, CTNNA1, DICER1, EPCAM (Deletion/duplication testing only), GREM1 (promoter region deletion/duplication testing only), KIT, MEN1, MLH1, MSH2, MSH3, MSH6, MUTYH, NBN, NF1, NHTL1, PALB2, PDGFRA, PMS2, POLD1, POLE, PTEN, RAD50, RAD51C, RAD51D, SDHB, SDHC, SDHD, SMAD4, SMARCA4. STK11, TP53, TSC1, TSC2, and VHL.  The following genes were evaluated for sequence changes only: SDHA and HOXB13 c.251G>A variant only. The report date is August 14, 2017.    09/24/2017 Progression    09/24/2017 CT CAP Pancreas: The mass involving the head of pancreas measures 2.5 x 3.8 by 3.1 cm. On the previous exam this measured 2.2 x 2.6 by 3.4 cm.    09/24/2017 Imaging    09/24/2017 CT CAP IMPRESSION: 1. There is been interval local progression of disease with increased size of  head of pancreas neoplasm. There is progressive vascular involvement with partial encasement of the celiac trunk and SMA. Continued encasement and marked narrowing of  the portal venous confluence. 2. No evidence for metastatic adenopathy within the abdomen, liver metastasis or pulmonary metastasis. 3. Aortic atherosclerosis and LAD coronary artery atherosclerotic calcifications. Aortic Atherosclerosis (ICD10-I70.0).      HISTORY OF PRESENTING ILLNESS: 06/14/17 [name] 77 y.o. female is a here because of newly diagnosed Pancreatic Cancer. The patient was referred by Dr. Paulita Fujita. The patient presents to the clinic today accompanied by her 2 sons and her daughter-in-law.   She was having aching of her left abdominal starting 3 months ago after UTI. This pain comes and goes, which worsened at night. She denies issues with her appetite overall but she does not eat as much as she did due to the recent death of her husband. She denies any abdominal bloating. The color of urine has not become dark.   Today the patient notes she still has left abdominal pain. She notes she plans to see her grandson in [place] this weekend. She plans to go to the beach for 1-2 weeks in May, 2018. She prefers to be reached by her home phone and then her daughter-in-law.  Socially she lives by herself since her husband's passing in 12/2016. Her house is 1 level. She has support from her son and his wife. She has good family support. She has a stationary bike he rides 30 minutes a day and keeps track of her steps per day. She is very active and goes out often. 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   In the past the patient had a complete hysterectomy in early 1981 for bleeding. During her surgery she had cancer cells found and had a BSO. She had no following treatments. She had a pelvic rebuilt in 1990s due to prolapse. She had a yearly pap smear. She is diagnosed os type II DM but is not on any medication at this time.  She takes tramadol for the pain as needed, trazodone to help her sleep and takes XANAX for her anxiety. She has been  on this for years. She has been on Trazodone for 40 years to also help her anxiety. She takes Lipitor for her high cholesterol, not regularly. She takes HCTZ for her fluid retention which she takes as needed. She takes Lisinopril for her HTN. She takes Synthroid for her hypothyroidism.  Her maternal grandmother had breast cancer. Her maternal cousin had breast cancer and her paternal cousin had a blood disorder. She smoked for 10-15 years of 1/2-1 pack a day before stopping in 1980s.   On review of symptoms, pt notes left abdominal ache which she tried tylenol and she was recently given Tramadol. This helped her some. She denies abdominal bloating, dark urine or jaundice and denies dysuria.  CURRENT THERAPY: chemo FOLFIRINOX every 2 weeks started 07/19/17. Given her thrombocytopenia I reduce her dose starting with cycle 5.   INTERVAL HISTORY:   SHENE MAXFIELD is here for follow-up. She is here with her family member. She is doing well after her last cycle. She experienced diarrhea 3 times a day and is taking lomotil for relief. She thinks some of her diarrhea could be related to metformin. She denies nausea, vomiting, or worsening neuropathy. She has good appetite and had a protein shake this morning. She states that her neuropathy is stable. She is having clear rhinorrhea,  but denies fever.    MEDICAL HISTORY:  Past Medical History:  Diagnosis Date  . Allergic rhinitis    Skin Test 04-14-2009  . Asthma   . Cancer ([place]) 1981   adenocarcinoma of uterus  . Diabetes mellitus without complication ([place])    type II, no meds per pt  . Family history of breast cancer   . History of uterine cancer   . Hyperlipemia   . Hypertension   . Insomnia     SURGICAL HISTORY: Past Surgical History:  Procedure Laterality Date  . ABDOMINAL HYSTERECTOMY    . CARPAL TUNNEL RELEASE    . EUS N/A 06/13/2017   Procedure: FULL UPPER ENDOSCOPIC ULTRASOUND (EUS) RADIAL;  Surgeon: Arta Silence, MD;  Location:  [facility];  Service: Endoscopy;  Laterality: N/A;  . I&D EXTREMITY  09/17/2011   Procedure: IRRIGATION AND DEBRIDEMENT EXTREMITY;  Surgeon: Roseanne Kaufman, MD;  Location: [place];  Service: Orthopedics;  Laterality: Left;  with drain placement  . IR FLUORO GUIDE PORT INSERTION RIGHT  07/18/2017  . IR US GUIDE VASC ACCESS RIGHT  07/18/2017  . NASAL SEPTOPLASTY W/ TURBINOPLASTY    . pelvic floor rebuild    . TONSILLECTOMY    . TOTAL ABDOMINAL HYSTERECTOMY W/ BILATERAL SALPINGOOPHORECTOMY      SOCIAL HISTORY: Social History   Socioeconomic History  . Marital status: Widowed    Spouse name: Not on file  . Number of children: Not on file  . Years of education: Not on file  . Highest education level: Not on file  Occupational History  . Not on file  Social Needs  . Financial resource strain: Not on file  . Food insecurity:    Worry: Not on file    Inability: Not on file  . Transportation needs:    Medical: Not on file    Non-medical: Not on file  Tobacco Use  . Smoking status: Former Smoker    Packs/day: 0.50    Years: 15.00    Pack years: 7.50    Types: Cigarettes    Last attempt to quit: 02/27/1978    Years since quitting: 39.6  . Smokeless tobacco: Never Used  Substance and Sexual Activity  . Alcohol use: Yes    Alcohol/week: 0.0 standard drinks    Comment: occ- glass of wine  . Drug use: No  . Sexual activity: Yes    Comment: 1st intercouse- 15, partners- 1  Lifestyle  . Physical activity:    Days per week: Not on file    Minutes per session: Not on file  . Stress: Not on file  Relationships  . Social connections:    Talks on phone: Not on file    Gets together: Not on file    Attends religious service: Not on file    Active member of club or organization: Not on file    Attends meetings of clubs or organizations: Not on file    Relationship status: Not on file  . Intimate partner violence:    Fear of current or ex partner: Not on file    Emotionally abused: Not  on file    Physically abused: Not on file    Forced sexual activity: Not on file  Other Topics Concern  . Not on file  Social History Narrative  . Not on file    FAMILY HISTORY: Family History  Problem Relation Age of Onset  . Stroke Mother 63  . Heart attack Father 33  . Dementia Brother   .  Hypertension Brother   . Skin cancer Brother   . Breast cancer Maternal Grandmother        dx >50  . Breast cancer Cousin        mat first cousin, dx in her 96s  . Cancer Cousin        oral cancer  . Breast cancer Cousin        mat first cousin, dx in her 32s  . Heart disease Maternal Uncle   . Other Maternal Grandfather        suicide  . Heart disease Paternal Grandmother     ALLERGIES:  is allergic to cephalexin and nabumetone.  MEDICATIONS:  Current Outpatient Medications  Medication Sig Dispense Refill  . ALPRAZolam (XANAX) 0.5 MG tablet Take 1 tablet (0.5 mg total) by mouth at bedtime as needed for anxiety. 90 tablet 0  . Ascorbic Acid (VITAMIN C) 500 MG PACK Take 100 mg by mouth daily.    Marland Kitchen atorvastatin (LIPITOR) 10 MG tablet Take 10 mg by mouth at bedtime.     Marland Kitchen CALCIUM PO Take 1 tablet by mouth daily.     . Cholecalciferol (VITAMIN D) 2000 units CAPS Take by mouth.    . Cyanocobalamin (VITAMIN B-12 PO) Take 1 tablet by mouth daily.     . hydrochlorothiazide (HYDRODIURIL) 25 MG tablet Take 1 tablet by mouth daily as needed.     . hyoscyamine (LEVBID) 0.375 MG 12 hr tablet Take 0.375 mg by mouth every 12 (twelve) hours as needed for cramping.     Marland Kitchen KLOR-CON M20 20 MEQ tablet TAKE 1 TABLET BY MOUTH TWICE A DAY 60 tablet 1  . latanoprost (XALATAN) 0.005 % ophthalmic solution 1 drop at bedtime.    Marland Kitchen levothyroxine (SYNTHROID, LEVOTHROID) 25 MCG tablet Take 25 mcg by mouth daily before breakfast.     . lidocaine-prilocaine (EMLA) cream Apply to affected area once 30 g 3  . magic mouthwash w/lidocaine SOLN Take 5 mLs by mouth 3 (three) times daily as needed for mouth pain. 240 mL  0  . metFORMIN (GLUCOPHAGE) 500 MG tablet Take 1 tablet (500 mg total) by mouth daily with breakfast. 30 tablet 1  . nitrofurantoin, macrocrystal-monohydrate, (MACROBID) 100 MG capsule Take 1 capsule (100 mg total) by mouth 2 (two) times daily. 10 capsule 0  . Omega-3 Fatty Acids (FISH OIL PO) Take 1 capsule by mouth daily.     Marland Kitchen omeprazole (PRILOSEC) 20 MG capsule Take 1 tablet by mouth daily.    . ondansetron (ZOFRAN) 8 MG tablet Take 1 tablet (8 mg total) by mouth 2 (two) times daily as needed for refractory nausea / vomiting. Start on day 3 after chemotherapy. 30 tablet 1  . ONETOUCH DELICA LANCETS 17G MISC USE TO TEST YOUR BLOOD SUGAR ONCE A DAY FASTING AND 2 HRS AFTER A MEAL AS NEEDED  3  . ONETOUCH VERIO test strip USE TO TEST YOUR BLOOD SUGAR ONCE A DAY FASTING & 2 HRS AFTER A MEAL AS NEEDED  3  . prochlorperazine (COMPAZINE) 10 MG tablet Take 1 tablet (10 mg total) by mouth every 6 (six) hours as needed (NAUSEA). 30 tablet 2  . traMADol (ULTRAM) 50 MG tablet Take 1-2 tablets every 6 hours as needed for pain 180 tablet 0  . traZODone (DESYREL) 100 MG tablet Take 1 tablet (100 mg total) by mouth at bedtime. (Patient taking differently: Take 50 mg by mouth at bedtime. ) 90 tablet 3  . diphenoxylate-atropine (LOMOTIL) 2.5-0.025 MG  tablet Take 1-2 tablets by mouth 4 (four) times daily as needed for diarrhea or loose stools. (Patient not taking: Reported on 10/25/2017) 60 tablet 1  . HYDROcodone-acetaminophen (NORCO) 5-325 MG tablet Take 1 tablet by mouth every 6 (six) hours as needed for moderate pain. (Patient not taking: Reported on 10/25/2017) 40 tablet 0   No current facility-administered medications for this visit.     REVIEW OF SYSTEMS:  Constitutional: Denies fevers, chills or abnormal night sweats (+) good appetite  Eyes: Denies double vision or watery eyes, blurred vision Ears, nose, mouth, throat, and face: Denies mucositis or sore throat  (+) clear rhinorrhea  Respiratory: Denies  cough, dyspnea or wheezes Cardiovascular: Denies palpitation, chest discomfort  Gastrointestinal:  Denies heartburn (+) intermittent diarrhea   Urinary: Denies frequency, urgency, burning, or incontinence.  Skin: Denies abnormal skin rashes (+) complete hair loss Lymphatics: Denies new lymphadenopathy or easy bruising Neurological:Denies numbness, tingling or new weaknesses MSK: No myalgia Behavioral/Psych: Mood is stable, no new changes All other systems were reviewed with the patient and are negative.  PHYSICAL EXAMINATION:  ECOG PERFORMANCE STATUS: 1 - Symptomatic but completely ambulatory  Vitals:   10/25/17 1000  BP: (!) 142/69  Pulse: 99  Resp: 18  Temp: 99.4 F (37.4 C)  SpO2: 97%   Filed Weights   10/25/17 1000  Weight: 140 lb 3.2 oz (63.6 kg)    GENERAL:alert, no distress and comfortable (+) hair loss (+) weight loss SKIN: skin color, texture, turgor are normal, no rashes or significant lesions EYES: normal, conjunctiva are pink and non-injected, sclera clear OROPHARYNX:no exudate, no erythema and lips, buccal mucosa, and tongue normal  NECK: supple, thyroid normal size, non-tender, without nodularity LYMPH:  no palpable lymphadenopathy in the cervical, axillary or inguinal LUNGS: clear to auscultation and percussion with normal breathing effort HEART: regular rhythm and no murmurs and no lower extremity edema  ABDOMEN:abdomen soft, non-tender and normal bowel sounds Musculoskeletal:no cyanosis of digits and no clubbing  PSYCH: alert & oriented x 3 with fluent speech NEURO: no focal motor/sensory deficits  LABORATORY DATA:  I have reviewed the data as listed CBC Latest Ref Rng & Units 10/25/2017 10/11/2017 09/27/2017  WBC 3.9 - 10.3 K/uL 9.3 5.3 7.4  Hemoglobin 11.6 - 15.9 g/dL 10.9(L) 11.1(L) 9.1(L)  Hematocrit 34.8 - 46.6 % 31.7(L) 34.0(L) 26.8(L)  Platelets 145 - 400 K/uL 78(L) 122(L) 61(L)    CMP Latest Ref Rng & Units 10/25/2017 10/11/2017 09/27/2017  Glucose  70 - 99 mg/dL 209(H) 240(H) 178(H)  BUN 8 - 23 mg/dL '8 13 13  ' Creatinine 0.44 - 1.00 mg/dL 0.79 0.74 0.73  Sodium 135 - 145 mmol/L 137 139 141  Potassium 3.5 - 5.1 mmol/L 3.9 4.1 3.5  Chloride 98 - 111 mmol/L 102 106 107  CO2 22 - 32 mmol/L '25 23 25  ' Calcium 8.9 - 10.3 mg/dL 9.1 9.1 9.2  Total Protein 6.5 - 8.1 g/dL 6.2(L) 6.2(L) 5.4(L)  Total Bilirubin 0.3 - 1.2 mg/dL 0.6 0.6 0.5  Alkaline Phos 38 - 126 U/L 166(H) 169(H) 136(H)  AST 15 - 41 U/L 35 29 22  ALT 0 - 44 U/L 29 35 20   Tumor Marker CA 19-9  07/19/17: 1436 08/16/17 606  09/13/17: 170  10/11/17: 95  PATHOLOGY  08/08/2017 Molecular Pathology   Diagnosis 06/13/17 FINE NEEDLE ASPIRATION, ENDOSCOPIC, PANCREAS HEAD (SPECIMEN 1 OF 1 COLLECTED 06/13/17): ADENOCARCINOMA. Preliminary Diagnosis Intraoperative Diagnosis: 1-3) ATYPICAL CELLS, ADDITIONAL MATERIAL REQUESTED. (NDK) 4-6 ADENOCARCINOMA. (NDK) Reported to Dr.  Outlaw on 06/13/17 @ 11:07am.   PROCEDURES  EUS by Dr. Paulita Fujita 06/13/17  IMPRESSION:  -There was no sign of significant pathology in the ampulla. - There was no evidence of significant pathology in the left lobe of the liver. - A mass was identified in the pancreatic head. Abutment SMV; invasion portal confluence; no adenopathy. Fine needle aspiration performed. If this is a pancreatic adenocarcinoma, it would be staged T4/N0/Mx by EUS.   RADIOGRAPHIC STUDIES: I have personally reviewed the radiological images as listed and agreed with the findings in the report.  09/24/2017 CT CAP IMPRESSION: 1. There is been interval local progression of disease with increased size of head of pancreas neoplasm. There is progressive vascular involvement with partial encasement of the celiac trunk and SMA. Continued encasement and marked narrowing of the portal venous confluence. 2. No evidence for metastatic adenopathy within the abdomen, liver metastasis or pulmonary metastasis. 3. Aortic atherosclerosis and LAD coronary  artery atherosclerotic calcifications. Aortic Atherosclerosis (ICD10-I70.0).  06/22/2017 CT CAP IMPRESSION: 1. No findings suspicious for metastatic disease within the chest. 2. Tiny subpleural right lower lobe pulmonary nodule is nonspecific, but likely benign. This can be addressed on follow-up imaging. 3.  Aortic Atherosclerosis (ICD10-I70.0).   No results found.  ASSESSMENT & PLAN:  NASIA CANNAN is a 77 y.o. Caucasian female with a history of hypothyroidism, GERD, Anxiety, osteopenia, Glaucoma, HTN, hypercholesterolemia, and H/o Uterine Cancer, presented with intermittent upper abdominal pain    1. 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  -She has locally advanced Adenocarcinoma of Pancreatic Head.  The EUS showed a 3.0 x 3.0 cm mass in the pancreatic head, with sonographic evidence suggesting invasion into the superior mesenteric artery, the portal vein,, the SMV, and splenic vein.  Unfortunately this is unresectable tumor, due to the invasion of SMA. -Staging scan was negative for metastasis. -Her case was discussed in our GI tumor board. Our surgeon Dr. Barry Dienes concurs that her pancreatic cancer is not resectable. -I previously recommended induction chemo for 4-6 months followed by radiation if no disease progression. We also discussed maintenance chemotherapy if she does not want to radiation. -The goal of therapy is palliative, we are not able to cure her cancer -She started FOLFIRINOX q2weeks on 07/19/17. She tolerated moderately well with some diarrhea and fatigue.  -She tolerated full dose cycle 2 and 3  Overall well. She developed Thrombocytopenia after cycle 5 and I reduced dose.  -She is tolerating treatment well overall, her pain has  improved, her tumor marker CA 19.9 has decreased significantly, indicating likely good response to treatment. -CT scan on 7/30 showed The mass involving the head of pancreas measures 2.5 x 3.8 by 3.1 cm, slightly bigger than before ( 2.2 x 2.6 by 3.4 cm).  No other evidence of metastasis.  However her previous scan was done one month before she started chemo, and we know imaging measurement of the pancreatic tumor sometimes could be not very accurate.  Review her case in our GI tumor board, given her clinical response and improvement on CA19.9, we recommend her to continue current chemo. -I encouraged her to eat more and increase her nutritional supplements and watch for worsened leg swelling or pain as chemo can increase her risk for blood clots. -will repeat scan in 2-3  month for close f/u  -Pt is tolerating chemo well, doing well clinically.she has a trip planned and next cycle chemo is scheduled for 4 weeks later. Due to her significant thrombocytopenia (platelet count 78K today), will continue chemo today with reduced irinotecan dose further and holding oxaliplatin. -I also discussed other possible treatment options like consolidation radiation today with her. I also informed her that the goal is to improve quality of life as her condition is difficult to completely cure.  -She is going to the beach from 9/10 to 9/17. She is scheduled for next chemo on 9/26 -Scan in October 2019 and follow up after that -F/u in 4 weeks with chemo  -Labs on 9/6   2. 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 She should reduce sugary fruits and high carb meets and increase vegetable.  -She was previously advised to continue to exercise as tolerable and check her Blood glucose at home in the morning and during the day. -Her BG is high at 240 today.  She said that she had a high carb breakfast. I advised her to eat less carbs and measure her BG frequently. -I prescribed Metformin 545m once daily today.  Potential side effects, especially diarrhea, reviewed with patient.  She agrees to proceed. -Check her HbA1c on next visit    3. Diarrhea, Abdominal pain and weight loss -She has previously met with our dietician BPamala HurryNef. I previously encouraged her to take a nutritional supplement -I previously reviewed her pain medication use. She can use Tramadol for moderate pain and hydrocodone for worse pain.  -She previously increased her Imodium and I prescribed Lomotil on 07/26/17 to take if imodium is not enough.   -Her intermittent diarrhea controlled with lomotil. Her pain is mild and nearly resolved. Weight is trending down, I encouraged her to increase premier supplements.   4. H/o Uterine Cancer, Stage I, diagnosed in 1981 -She had a total hysterectomy and BSO in 1981. She had no following treatments -Followed by her Gynecologist with yearly pap smears.   5. Genetic Testing  -Given her history of uterine cancer and family history of breast cancer she is eligible for genetic testing. She expressed interest and. I previously sent genetic counseling referral. -genetic test was negative   6. Social Support -She lives alone but has good familial support.  -I previously recommended that she has someone check on her weekly especially when on chemo treatment.  -I previously sent a SW referral and she has previously met with them  7. Hypokalemia   - likely related to her diarrhea  -I previously prescribed potassium chloride 20 mEq twice daily for 5 days, then to continue once daily -We will continue close monitoring - She increased to 20 MEQ twice daily at home -K normal today    8. HTN, Hypothyroidism  -She is on HCTZ as needed and Lisinopril daily  -She is on Levothyroxine daily -I discussed with Chemo her BP may becomes low. I  recommended if she develops this or dizziness I suggest she hold HCTZ first and continue to monitor her BP at home.  -Continue to Follow up with PCP for management and medication.  - She is currently taking half dose of her BP medication because of low morning BP readings at home. -I advised her to continue monitoring with possibility of  stop BP medication if BP continues to drop or is causing symptoms.  9.Goal of care discussion  -We again discussed the incurable nature of her cancer, and the overall poor prognosis, especially if she does not have good response to chemotherapy or progress on chemo -The patient understands the goal of care is palliative. -she is full code now   PLAN:  -Lab reviewed, due to significant thrombocytopenia, will continue chemo with reduce dose of irinotecan and no oxaliplatin today. -Scan in October 2019 and f/u after -f/u in 4 weeks with chemo (pt is travelling from 9/10 to 9/17)  -Labs on 9/6 before travel   All questions were answered. The patient knows to call the clinic with any problems, questions or concerns.  I spent 20 minutes counseling the patient face to face. The total time spent in the appointment was 30 minutes and more than 50% was on counseling.  Dierdre Searles Dweik am acting as scribe for Dr. Truitt Merle.  I have reviewed the above documentation for accuracy and completeness, and I agree with the above.      Truitt Merle, MD 10/25/2017

## 2017-10-25 ENCOUNTER — Inpatient Hospital Stay: Payer: Medicare Other

## 2017-10-25 ENCOUNTER — Inpatient Hospital Stay (HOSPITAL_BASED_OUTPATIENT_CLINIC_OR_DEPARTMENT_OTHER): Payer: Medicare Other | Admitting: Hematology

## 2017-10-25 ENCOUNTER — Telehealth: Payer: Self-pay

## 2017-10-25 VITALS — BP 142/69 | HR 99 | Temp 99.4°F | Resp 18 | Ht 62.5 in | Wt 140.2 lb

## 2017-10-25 DIAGNOSIS — Z8542 Personal history of malignant neoplasm of other parts of uterus: Secondary | ICD-10-CM

## 2017-10-25 DIAGNOSIS — Z5189 Encounter for other specified aftercare: Secondary | ICD-10-CM | POA: Diagnosis not present

## 2017-10-25 DIAGNOSIS — E039 Hypothyroidism, unspecified: Secondary | ICD-10-CM

## 2017-10-25 DIAGNOSIS — Z803 Family history of malignant neoplasm of breast: Secondary | ICD-10-CM

## 2017-10-25 DIAGNOSIS — E876 Hypokalemia: Secondary | ICD-10-CM

## 2017-10-25 DIAGNOSIS — C250 Malignant neoplasm of head of pancreas: Secondary | ICD-10-CM

## 2017-10-25 DIAGNOSIS — E119 Type 2 diabetes mellitus without complications: Secondary | ICD-10-CM | POA: Diagnosis not present

## 2017-10-25 DIAGNOSIS — I10 Essential (primary) hypertension: Secondary | ICD-10-CM

## 2017-10-25 DIAGNOSIS — Z7189 Other specified counseling: Secondary | ICD-10-CM

## 2017-10-25 DIAGNOSIS — Z5111 Encounter for antineoplastic chemotherapy: Secondary | ICD-10-CM | POA: Diagnosis not present

## 2017-10-25 DIAGNOSIS — D6959 Other secondary thrombocytopenia: Secondary | ICD-10-CM

## 2017-10-25 DIAGNOSIS — Z95828 Presence of other vascular implants and grafts: Secondary | ICD-10-CM

## 2017-10-25 DIAGNOSIS — Z7984 Long term (current) use of oral hypoglycemic drugs: Secondary | ICD-10-CM | POA: Diagnosis not present

## 2017-10-25 LAB — CMP (CANCER CENTER ONLY)
ALBUMIN: 3.2 g/dL — AB (ref 3.5–5.0)
ALT: 29 U/L (ref 0–44)
AST: 35 U/L (ref 15–41)
Alkaline Phosphatase: 166 U/L — ABNORMAL HIGH (ref 38–126)
Anion gap: 10 (ref 5–15)
BUN: 8 mg/dL (ref 8–23)
CHLORIDE: 102 mmol/L (ref 98–111)
CO2: 25 mmol/L (ref 22–32)
CREATININE: 0.79 mg/dL (ref 0.44–1.00)
Calcium: 9.1 mg/dL (ref 8.9–10.3)
GFR, Est AFR Am: >60 mL/min (ref >60)
GLUCOSE: 209 mg/dL — AB (ref 70–99)
POTASSIUM: 3.9 mmol/L (ref 3.5–5.1)
SODIUM: 137 mmol/L (ref 135–145)
Total Bilirubin: 0.6 mg/dL (ref 0.3–1.2)
Total Protein: 6.2 g/dL — ABNORMAL LOW (ref 6.5–8.1)

## 2017-10-25 LAB — CBC WITH DIFFERENTIAL (CANCER CENTER ONLY)
Basophils Absolute: 0 10*3/uL (ref 0.0–0.1)
Basophils Relative: 0 %
Eosinophils Absolute: 0 10*3/uL (ref 0.0–0.5)
Eosinophils Relative: 0 %
HCT: 31.7 % — ABNORMAL LOW (ref 34.8–46.6)
Hemoglobin: 10.9 g/dL — ABNORMAL LOW (ref 11.6–15.9)
LYMPHS ABS: 0.4 10*3/uL — AB (ref 0.9–3.3)
LYMPHS PCT: 4 %
MCH: 34.4 pg — AB (ref 25.1–34.0)
MCHC: 34.2 g/dL (ref 31.5–36.0)
MCV: 100.5 fL (ref 79.5–101.0)
MONOS PCT: 7 %
Monocytes Absolute: 0.7 10*3/uL (ref 0.1–0.9)
Neutro Abs: 8.2 10*3/uL — ABNORMAL HIGH (ref 1.5–6.5)
Neutrophils Relative %: 89 %
PLATELETS: 78 10*3/uL — AB (ref 145–400)
RBC: 3.16 MIL/uL — AB (ref 3.70–5.45)
RDW: 17 % — ABNORMAL HIGH (ref 11.2–14.5)
WBC: 9.3 10*3/uL (ref 3.9–10.3)

## 2017-10-25 MED ORDER — DEXAMETHASONE SODIUM PHOSPHATE 10 MG/ML IJ SOLN
INTRAMUSCULAR | Status: AC
  Filled 2017-10-25: qty 1

## 2017-10-25 MED ORDER — SODIUM CHLORIDE 0.9% FLUSH
10.0000 mL | INTRAVENOUS | Status: DC | PRN
  Administered 2017-10-25: 10 mL
  Filled 2017-10-25: qty 10

## 2017-10-25 MED ORDER — DEXTROSE 5 % IV SOLN
INTRAVENOUS | Status: DC
  Administered 2017-10-25: 12:00:00 via INTRAVENOUS
  Filled 2017-10-25: qty 250

## 2017-10-25 MED ORDER — ATROPINE SULFATE 1 MG/ML IJ SOLN
INTRAMUSCULAR | Status: AC
  Filled 2017-10-25: qty 1

## 2017-10-25 MED ORDER — DEXAMETHASONE SODIUM PHOSPHATE 10 MG/ML IJ SOLN
10.0000 mg | Freq: Once | INTRAMUSCULAR | Status: AC
  Administered 2017-10-25: 10 mg via INTRAVENOUS

## 2017-10-25 MED ORDER — PALONOSETRON HCL INJECTION 0.25 MG/5ML
0.2500 mg | Freq: Once | INTRAVENOUS | Status: AC
  Administered 2017-10-25: 0.25 mg via INTRAVENOUS

## 2017-10-25 MED ORDER — DEXTROSE 5 % IV SOLN
Freq: Once | INTRAVENOUS | Status: AC
  Administered 2017-10-25: 11:00:00 via INTRAVENOUS
  Filled 2017-10-25: qty 250

## 2017-10-25 MED ORDER — ATROPINE SULFATE 1 MG/ML IJ SOLN
0.5000 mg | Freq: Once | INTRAMUSCULAR | Status: AC | PRN
  Administered 2017-10-25: 0.5 mg via INTRAVENOUS

## 2017-10-25 MED ORDER — IRINOTECAN HCL CHEMO INJECTION 100 MG/5ML
80.0000 mg/m2 | Freq: Once | INTRAVENOUS | Status: AC
  Administered 2017-10-25: 140 mg via INTRAVENOUS
  Filled 2017-10-25: qty 7

## 2017-10-25 MED ORDER — SODIUM CHLORIDE 0.9 % IV SOLN
1800.0000 mg/m2 | INTRAVENOUS | Status: DC
  Administered 2017-10-25: 3050 mg via INTRAVENOUS
  Filled 2017-10-25: qty 61

## 2017-10-25 MED ORDER — LEUCOVORIN CALCIUM INJECTION 350 MG
400.0000 mg/m2 | Freq: Once | INTRAVENOUS | Status: AC
  Administered 2017-10-25: 680 mg via INTRAVENOUS
  Filled 2017-10-25: qty 34

## 2017-10-25 MED ORDER — PALONOSETRON HCL INJECTION 0.25 MG/5ML
INTRAVENOUS | Status: AC
  Filled 2017-10-25: qty 5

## 2017-10-25 NOTE — Progress Notes (Signed)
Per Dr. Burr Medico, [place] to proceed with chemo today with dose reduction based on lab results today.

## 2017-10-25 NOTE — Patient Instructions (Addendum)
[place] Discharge Instructions for Patients Receiving Chemotherapy  Today you received the following chemotherapy agents: Leucovorin, Irniotecan (Camptosar), and Fluorouracil (Adrucil, 5-FU)  To help prevent nausea and vomiting after your treatment, we encourage you to take your nausea medication as prescribed. Received Aloxi during treatment today-->Take Compazine (not Zofran) for the next 3 days as needed.    If you develop nausea and vomiting that is not controlled by your nausea medication, call the clinic.   BELOW ARE SYMPTOMS THAT SHOULD BE REPORTED IMMEDIATELY:  *FEVER GREATER THAN 100.5 F  *CHILLS WITH OR WITHOUT FEVER  NAUSEA AND VOMITING THAT IS NOT CONTROLLED WITH YOUR NAUSEA MEDICATION  *UNUSUAL SHORTNESS OF BREATH  *UNUSUAL BRUISING OR BLEEDING  TENDERNESS IN MOUTH AND THROAT WITH OR WITHOUT PRESENCE OF ULCERS  *URINARY PROBLEMS  *BOWEL PROBLEMS  UNUSUAL RASH Items with * indicate a potential emergency and should be followed up as soon as possible.  Feel free to call the clinic should you have any questions or concerns. The clinic phone number is [phone].  Please show the [place] at check-in to the Emergency Department and triage nurse.   Thrombocytopenia Thrombocytopenia means that you have a low number of platelets in your blood. Platelets are tiny cells in the blood. When you bleed, they clump together at the cut or injury to stop the bleeding. This is called blood clotting. Not having enough platelets can cause bleeding problems. Follow these instructions at home: General instructions  Check your skin and inside your mouth for bruises or blood as told by your doctor.  Check to see if there is blood in your spit (sputum), pee (urine), and poop (stool). Do this as told by your doctor.  Ask your doctor if you can drink alcohol.  Take over-the-counter and prescription medicines only as told by your doctor.  Tell all of  your doctors that you have this condition. Be sure to tell your dentist and eye doctor too. Activity  Do not do activities that can cause bumps or bruises until your doctor says it is okay.  Be careful not to cut yourself: ? When you shave. ? When you use scissors, needles, knives, or other tools.  Be careful not to burn yourself: ? When you use an iron. ? When you cook. Contact a doctor if:  You have bruises and you do not know why. Get help right away if:  You are bleeding anywhere on your body.  You have blood in your spit, pee, or poop. This information is not intended to replace advice given to you by your health care provider. Make sure you discuss any questions you have with your health care provider. Document Released: 02/02/2011 Document Revised: 10/17/2015 Document Reviewed: 08/17/2014 Elsevier Interactive Patient Education  [employer].

## 2017-10-25 NOTE — Telephone Encounter (Signed)
Printed avs and calender of upcoming appointment. per 8/29

## 2017-10-26 ENCOUNTER — Encounter: Payer: Self-pay | Admitting: Hematology

## 2017-10-27 ENCOUNTER — Ambulatory Visit: Payer: Medicare Other

## 2017-10-27 VITALS — BP 123/68 | HR 90 | Temp 98.5°F | Resp 18

## 2017-10-27 DIAGNOSIS — E119 Type 2 diabetes mellitus without complications: Secondary | ICD-10-CM | POA: Diagnosis not present

## 2017-10-27 DIAGNOSIS — C250 Malignant neoplasm of head of pancreas: Secondary | ICD-10-CM | POA: Diagnosis not present

## 2017-10-27 DIAGNOSIS — Z5189 Encounter for other specified aftercare: Secondary | ICD-10-CM | POA: Diagnosis not present

## 2017-10-27 DIAGNOSIS — D6959 Other secondary thrombocytopenia: Secondary | ICD-10-CM | POA: Diagnosis not present

## 2017-10-27 DIAGNOSIS — Z5111 Encounter for antineoplastic chemotherapy: Secondary | ICD-10-CM | POA: Diagnosis not present

## 2017-10-27 DIAGNOSIS — E876 Hypokalemia: Secondary | ICD-10-CM | POA: Diagnosis not present

## 2017-10-27 MED ORDER — PEGFILGRASTIM INJECTION 6 MG/0.6ML ~~LOC~~
6.0000 mg | PREFILLED_SYRINGE | Freq: Once | SUBCUTANEOUS | Status: AC
  Administered 2017-10-27: 6 mg via SUBCUTANEOUS

## 2017-10-27 MED ORDER — HEPARIN SOD (PORK) LOCK FLUSH 100 UNIT/ML IV SOLN
500.0000 [IU] | Freq: Once | INTRAVENOUS | Status: AC | PRN
  Administered 2017-10-27: 500 [IU]
  Filled 2017-10-27: qty 5

## 2017-10-27 MED ORDER — SODIUM CHLORIDE 0.9% FLUSH
10.0000 mL | INTRAVENOUS | Status: DC | PRN
  Administered 2017-10-27: 10 mL
  Filled 2017-10-27: qty 10

## 2017-10-27 MED ORDER — PEGFILGRASTIM INJECTION 6 MG/0.6ML ~~LOC~~
PREFILLED_SYRINGE | SUBCUTANEOUS | Status: AC
  Filled 2017-10-27: qty 0.6

## 2017-10-28 ENCOUNTER — Other Ambulatory Visit: Payer: Self-pay | Admitting: Hematology

## 2017-11-02 ENCOUNTER — Inpatient Hospital Stay: Payer: Medicare Other | Attending: Hematology

## 2017-11-02 ENCOUNTER — Other Ambulatory Visit: Payer: Self-pay | Admitting: Hematology

## 2017-11-02 DIAGNOSIS — E876 Hypokalemia: Secondary | ICD-10-CM | POA: Insufficient documentation

## 2017-11-02 DIAGNOSIS — H2513 Age-related nuclear cataract, bilateral: Secondary | ICD-10-CM | POA: Diagnosis not present

## 2017-11-02 DIAGNOSIS — E039 Hypothyroidism, unspecified: Secondary | ICD-10-CM | POA: Diagnosis not present

## 2017-11-02 DIAGNOSIS — I10 Essential (primary) hypertension: Secondary | ICD-10-CM | POA: Insufficient documentation

## 2017-11-02 DIAGNOSIS — Z8542 Personal history of malignant neoplasm of other parts of uterus: Secondary | ICD-10-CM | POA: Insufficient documentation

## 2017-11-02 DIAGNOSIS — Z452 Encounter for adjustment and management of vascular access device: Secondary | ICD-10-CM | POA: Diagnosis not present

## 2017-11-02 DIAGNOSIS — C250 Malignant neoplasm of head of pancreas: Secondary | ICD-10-CM | POA: Insufficient documentation

## 2017-11-02 DIAGNOSIS — H401132 Primary open-angle glaucoma, bilateral, moderate stage: Secondary | ICD-10-CM | POA: Diagnosis not present

## 2017-11-02 DIAGNOSIS — Z5111 Encounter for antineoplastic chemotherapy: Secondary | ICD-10-CM | POA: Insufficient documentation

## 2017-11-02 DIAGNOSIS — Z5189 Encounter for other specified aftercare: Secondary | ICD-10-CM | POA: Insufficient documentation

## 2017-11-02 DIAGNOSIS — E119 Type 2 diabetes mellitus without complications: Secondary | ICD-10-CM | POA: Diagnosis not present

## 2017-11-02 LAB — CMP (CANCER CENTER ONLY)
ALBUMIN: 3 g/dL — AB (ref 3.5–5.0)
ALT: 11 U/L (ref 0–44)
AST: 15 U/L (ref 15–41)
Alkaline Phosphatase: 166 U/L — ABNORMAL HIGH (ref 38–126)
Anion gap: 8 (ref 5–15)
BUN: 6 mg/dL — AB (ref 8–23)
CHLORIDE: 105 mmol/L (ref 98–111)
CO2: 25 mmol/L (ref 22–32)
CREATININE: 0.73 mg/dL (ref 0.44–1.00)
Calcium: 9.3 mg/dL (ref 8.9–10.3)
GFR, Est AFR Am: >60 mL/min (ref >60)
Glucose, Bld: 94 mg/dL (ref 70–99)
POTASSIUM: 4.2 mmol/L (ref 3.5–5.1)
Sodium: 138 mmol/L (ref 135–145)
Total Bilirubin: 0.6 mg/dL (ref 0.3–1.2)
Total Protein: 6.3 g/dL — ABNORMAL LOW (ref 6.5–8.1)

## 2017-11-02 LAB — CBC WITH DIFFERENTIAL (CANCER CENTER ONLY)
BASOS PCT: 1 %
Basophils Absolute: 0 10*3/uL (ref 0.0–0.1)
EOS PCT: 1 %
Eosinophils Absolute: 0 10*3/uL (ref 0.0–0.5)
HCT: 29.1 % — ABNORMAL LOW (ref 34.8–46.6)
Hemoglobin: 9.9 g/dL — ABNORMAL LOW (ref 11.6–15.9)
LYMPHS PCT: 19 %
Lymphs Abs: 0.9 10*3/uL (ref 0.9–3.3)
MCH: 34 pg (ref 25.1–34.0)
MCHC: 34.2 g/dL (ref 31.5–36.0)
MCV: 99.4 fL (ref 79.5–101.0)
Monocytes Absolute: 0.9 10*3/uL (ref 0.1–0.9)
Monocytes Relative: 18 %
NEUTROS ABS: 3 10*3/uL (ref 1.5–6.5)
Neutrophils Relative %: 61 %
PLATELETS: 70 10*3/uL — AB (ref 145–400)
RBC: 2.93 MIL/uL — AB (ref 3.70–5.45)
RDW: 15.8 % — ABNORMAL HIGH (ref 11.2–14.5)
WBC Count: 4.8 10*3/uL (ref 3.9–10.3)

## 2017-11-07 ENCOUNTER — Ambulatory Visit: Payer: Medicare Other | Admitting: Hematology

## 2017-11-07 ENCOUNTER — Ambulatory Visit: Payer: Medicare Other

## 2017-11-07 ENCOUNTER — Other Ambulatory Visit: Payer: Medicare Other

## 2017-11-10 ENCOUNTER — Other Ambulatory Visit: Payer: Self-pay | Admitting: Hematology

## 2017-11-10 DIAGNOSIS — C250 Malignant neoplasm of head of pancreas: Secondary | ICD-10-CM

## 2017-11-20 NOTE — Progress Notes (Signed)
[place]  [phone] [phone]  Clinic Follow Up Note   Patient Care Team: Darcus Austin, MD as PCP - General (Family Medicine) Arta Silence, MD as Consulting Physician (Gastroenterology) Truitt Merle, MD as Consulting Physician (Hematology).  Date of Service:  11/22/2017  CHIEF COMPLAINTS:  F/u for Pancreatic Cancer, stage III   Oncology History   Cancer Staging Pancreatic cancer [facility]) Staging form: Exocrine Pancreas, AJCC 8th Edition - Clinical stage from 06/13/2017: Stage III (cT4, cN0, cM0) - Signed by Truitt Merle, MD on 06/13/2017       Pancreatic cancer ([place])   06/08/2017 Imaging    06/08/2017 CT Abdomen IMPRESSION: Irregular solid hypoechoic mass within the pancreatic head, 2.6 x 2.2 cm with associated pancreatic ductal dilatation and pancreatic atrophy, most compatible with pancreatic cancer. 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  Diffuse colonic diverticulosis. Slight stranding around the distal descending colon may reflect early active diverticulitis.  Bilateral renal parapelvic cysts.  Aortic atherosclerosis.    06/13/2017 Initial Diagnosis    Pancreatic cancer ([place])    06/13/2017 Cancer Staging    Staging form: Exocrine Pancreas, AJCC 8th Edition - Clinical stage from 06/13/2017: Stage III (cT4, cN0, cM0) - Signed by Truitt Merle, MD on 06/13/2017    06/13/2017 Procedure    EUS by Dr. Paulita Fujita 06/13/17  IMPRESSION:  -There was no sign of significant pathology in the ampulla. - There was no evidence of significant pathology in the left lobe of the liver. - A mass was identified in the pancreatic head. Abutment SMV; invasion portal confluence; no adenopathy. Fine needle aspiration performed. If this is a pancreatic adenocarcinoma, it would be staged T4/N0/Mx by EUS.    06/13/2017 Initial Biopsy    Diagnosis 06/13/17 FINE NEEDLE  ASPIRATION, ENDOSCOPIC, PANCREAS HEAD (SPECIMEN 1 OF 1 COLLECTED 06/13/17): ADENOCARCINOMA. Preliminary Diagnosis Intraoperative Diagnosis: 1-3) ATYPICAL CELLS, ADDITIONAL MATERIAL REQUESTED. (NDK) 4-6 ADENOCARCINOMA. (NDK) Reported to Dr. Paulita Fujita on 06/13/17 @ 11:07am.     06/21/2017 Imaging    CT Chest without contrast IMPRESSION: 1. No findings suspicious for metastatic disease within the chest.  2. Tiny subpleural right lower lobe pulmonary nodule is nonspecific, but likely benign. This can be addressed on follow-up imaging.  3. Aortic Atherosclerosis (ICD10-I70.0).    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  The Hereditary Gene Panel offered by [facility] includes sequencing and/or deletion duplication testing of the following 47 genes: APC, ATM, AXIN2, BARD1, BMPR1A, BRCA1, BRCA2, BRIP1, CDH1, CDK4, CDKN2A (p14ARF), CDKN2A (p16INK4a), CHEK2, CTNNA1, DICER1, EPCAM (Deletion/duplication testing only), GREM1 (promoter region deletion/duplication testing only), KIT, MEN1, MLH1, MSH2, MSH3, MSH6, MUTYH, NBN, NF1, NHTL1, PALB2, PDGFRA, PMS2, POLD1, POLE, PTEN, RAD50, RAD51C, RAD51D, SDHB, SDHC, SDHD, SMAD4, SMARCA4. STK11, TP53, TSC1, TSC2, and VHL.  The following genes were evaluated for sequence changes only: SDHA and HOXB13 c.251G>A variant only. The report date is August 14, 2017.    09/24/2017 Progression    09/24/2017 CT CAP Pancreas: The mass involving the head of pancreas measures 2.5 x 3.8 by 3.1 cm. On the previous exam this measured 2.2 x 2.6 by 3.4 cm.    09/24/2017 Imaging    09/24/2017 CT CAP IMPRESSION: 1. There is been interval local progression of disease with increased size of  head of pancreas neoplasm. There is progressive vascular involvement with partial encasement of the celiac trunk and SMA. Continued encasement and marked narrowing of  the portal venous confluence. 2. No evidence for metastatic adenopathy within the abdomen, liver metastasis or pulmonary metastasis. 3. Aortic atherosclerosis and LAD coronary artery atherosclerotic calcifications. Aortic Atherosclerosis (ICD10-I70.0).      HISTORY OF PRESENTING ILLNESS: 06/14/17 Carly [name] 77 y.o. female is a here because of newly diagnosed Pancreatic Cancer. The patient was referred by Dr. Paulita Fujita. The patient presents to the clinic today accompanied by her 2 sons and her daughter-in-law.   She was having aching of her left abdominal starting 3 months ago after UTI. This pain comes and goes, which worsened at night. She denies issues with her appetite overall but she does not eat as much as she did due to the recent death of her husband. She denies any abdominal bloating. The color of urine has not become dark.   Today the patient notes she still has left abdominal pain. She notes she plans to see her grandson in [place] this weekend. She plans to go to the beach for 1-2 weeks in May, 2018. She prefers to be reached by her home phone and then her daughter-in-law.  Socially she lives by herself since her husband's passing in 12/2016. Her house is 1 level. She has support from her son and his wife. She has good family support. She has a stationary bike he rides 30 minutes a day and keeps track of her steps per day. She is very active and goes out often. 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   In the past the patient had a complete hysterectomy in early 1981 for bleeding. During her surgery she had cancer cells found and had a BSO. She had no following treatments. She had a pelvic rebuilt in 1990s due to prolapse. She had a yearly pap smear. She is diagnosed os type II DM but is not on any medication at this time.  She takes tramadol for the pain as needed, trazodone to help her sleep and takes XANAX for her anxiety. She has been  on this for years. She has been on Trazodone for 40 years to also help her anxiety. She takes Lipitor for her high cholesterol, not regularly. She takes HCTZ for her fluid retention which she takes as needed. She takes Lisinopril for her HTN. She takes Synthroid for her hypothyroidism.  Her maternal grandmother had breast cancer. Her maternal cousin had breast cancer and her paternal cousin had a blood disorder. She smoked for 10-15 years of 1/2-1 pack a day before stopping in 1980s.   On review of symptoms, pt notes left abdominal ache which she tried tylenol and she was recently given Tramadol. This helped her some. She denies abdominal bloating, dark urine or jaundice and denies dysuria.  CURRENT THERAPY: chemo FOLFIRINOX every 2 weeks started 07/19/17. Given her thrombocytopenia I reduce her dose starting with cycle 5.   INTERVAL HISTORY:   Carly [name] is here for follow-up. She is here with her family member. She enjoyed her vacation. She tolerated last treatment well with mild tolerable diarrhea. She uses imodium and Lomotil as needed. She is taking potassium pills.  She otherwise denies any significant nausea, vomiting.  Her appetite has been good, energy is fair.  She functions very well at home.  No other complaints.  Review of system otherwise negative.   MEDICAL  HISTORY:  Past Medical History:  Diagnosis Date  . Allergic rhinitis    Skin Test 04-14-2009  . Asthma   . Cancer ([place]) 1981   adenocarcinoma of uterus  . Diabetes mellitus without complication ([place])    type II, no meds per pt  . Family history of breast cancer   . History of uterine cancer   . Hyperlipemia   . Hypertension   . Insomnia     SURGICAL HISTORY: Past Surgical History:  Procedure Laterality Date  . ABDOMINAL HYSTERECTOMY    . CARPAL TUNNEL RELEASE    . EUS N/A 06/13/2017   Procedure: FULL UPPER ENDOSCOPIC ULTRASOUND (EUS) RADIAL;  Surgeon: Arta Silence, MD;  Location: [facility];  Service:  Endoscopy;  Laterality: N/A;  . I&D EXTREMITY  09/17/2011   Procedure: IRRIGATION AND DEBRIDEMENT EXTREMITY;  Surgeon: Roseanne Kaufman, MD;  Location: [facility];  Service: Orthopedics;  Laterality: Left;  with drain placement  . IR FLUORO GUIDE PORT INSERTION RIGHT  07/18/2017  . IR US GUIDE VASC ACCESS RIGHT  07/18/2017  . NASAL SEPTOPLASTY W/ TURBINOPLASTY    . pelvic floor rebuild    . TONSILLECTOMY    . TOTAL ABDOMINAL HYSTERECTOMY W/ BILATERAL SALPINGOOPHORECTOMY      SOCIAL HISTORY: Social History   Socioeconomic History  . Marital status: Widowed    Spouse name: Not on file  . Number of children: Not on file  . Years of education: Not on file  . Highest education level: Not on file  Occupational History  . Not on file  Social Needs  . Financial resource strain: Not on file  . Food insecurity:    Worry: Not on file    Inability: Not on file  . Transportation needs:    Medical: Not on file    Non-medical: Not on file  Tobacco Use  . Smoking status: Former Smoker    Packs/day: 0.50    Years: 15.00    Pack years: 7.50    Types: Cigarettes    Last attempt to quit: 02/27/1978    Years since quitting: 39.7  . Smokeless tobacco: Never Used  Substance and Sexual Activity  . Alcohol use: Yes    Alcohol/week: 0.0 standard drinks    Comment: occ- glass of wine  . Drug use: No  . Sexual activity: Yes    Comment: 1st intercouse- 15, partners- 1  Lifestyle  . Physical activity:    Days per week: Not on file    Minutes per session: Not on file  . Stress: Not on file  Relationships  . Social connections:    Talks on phone: Not on file    Gets together: Not on file    Attends religious service: Not on file    Active member of club or organization: Not on file    Attends meetings of clubs or organizations: Not on file    Relationship status: Not on file  . Intimate partner violence:    Fear of current or ex partner: Not on file    Emotionally abused: Not on file    Physically  abused: Not on file    Forced sexual activity: Not on file  Other Topics Concern  . Not on file  Social History Narrative  . Not on file    FAMILY HISTORY: Family History  Problem Relation Age of Onset  . Stroke Mother 95  . Heart attack Father 62  . Dementia Brother   . Hypertension Brother   .  Skin cancer Brother   . Breast cancer Maternal Grandmother        dx >50  . Breast cancer Cousin        mat first cousin, dx in her 57s  . Cancer Cousin        oral cancer  . Breast cancer Cousin        mat first cousin, dx in her 55s  . Heart disease Maternal Uncle   . Other Maternal Grandfather        suicide  . Heart disease Paternal Grandmother     ALLERGIES:  is allergic to cephalexin and nabumetone.  MEDICATIONS:  Current Outpatient Medications  Medication Sig Dispense Refill  . ALPRAZolam (XANAX) 0.5 MG tablet Take 1 tablet (0.5 mg total) by mouth at bedtime as needed for anxiety. 90 tablet 0  . Ascorbic Acid (VITAMIN C) 500 MG PACK Take 100 mg by mouth daily.    Marland Kitchen atorvastatin (LIPITOR) 10 MG tablet Take 10 mg by mouth at bedtime.     Marland Kitchen CALCIUM PO Take 1 tablet by mouth daily.     . Cholecalciferol (VITAMIN D) 2000 units CAPS Take by mouth.    . Cyanocobalamin (VITAMIN B-12 PO) Take 1 tablet by mouth daily.     . diphenoxylate-atropine (LOMOTIL) 2.5-0.025 MG tablet Take 1-2 tablets by mouth 4 (four) times daily as needed for diarrhea or loose stools. 60 tablet 1  . hydrochlorothiazide (HYDRODIURIL) 25 MG tablet Take 1 tablet by mouth daily as needed.     Marland Kitchen HYDROcodone-acetaminophen (NORCO) 5-325 MG tablet Take 1 tablet by mouth every 6 (six) hours as needed for moderate pain. 40 tablet 0  . hyoscyamine (LEVBID) 0.375 MG 12 hr tablet Take 0.375 mg by mouth every 12 (twelve) hours as needed for cramping.     Marland Kitchen KLOR-CON M20 20 MEQ tablet TAKE 1 TABLET BY MOUTH TWICE A DAY 60 tablet 1  . KLOR-CON M20 20 MEQ tablet TAKE 1 TABLET (20 MEQ TOTAL) BY MOUTH ONCE FOR 1 DOSE. 30  tablet 1  . KLOR-CON M20 20 MEQ tablet TAKE 1 TABLET BY MOUTH TWICE A DAY 60 tablet 1  . latanoprost (XALATAN) 0.005 % ophthalmic solution 1 drop at bedtime.    Marland Kitchen levothyroxine (SYNTHROID, LEVOTHROID) 25 MCG tablet Take 25 mcg by mouth daily before breakfast.     . lidocaine-prilocaine (EMLA) cream Apply to affected area once 30 g 3  . magic mouthwash w/lidocaine SOLN Take 5 mLs by mouth 3 (three) times daily as needed for mouth pain. 240 mL 0  . metFORMIN (GLUCOPHAGE) 500 MG tablet TAKE 1 TABLET BY MOUTH EVERY DAY WITH BREAKFAST 30 tablet 1  . nitrofurantoin, macrocrystal-monohydrate, (MACROBID) 100 MG capsule Take 1 capsule (100 mg total) by mouth 2 (two) times daily. 10 capsule 0  . Omega-3 Fatty Acids (FISH OIL PO) Take 1 capsule by mouth daily.     Marland Kitchen omeprazole (PRILOSEC) 20 MG capsule Take 1 tablet by mouth daily.    . ondansetron (ZOFRAN) 8 MG tablet Take 1 tablet (8 mg total) by mouth 2 (two) times daily as needed for refractory nausea / vomiting. Start on day 3 after chemotherapy. 30 tablet 1  . ONETOUCH DELICA LANCETS 27O MISC USE TO TEST YOUR BLOOD SUGAR ONCE A DAY FASTING AND 2 HRS AFTER A MEAL AS NEEDED  3  . ONETOUCH VERIO test strip USE TO TEST YOUR BLOOD SUGAR ONCE A DAY FASTING & 2 HRS AFTER A MEAL AS NEEDED  3  . prochlorperazine (COMPAZINE) 10 MG tablet Take 1 tablet (10 mg total) by mouth every 6 (six) hours as needed (NAUSEA). 30 tablet 2  . traMADol (ULTRAM) 50 MG tablet Take 1-2 tablets every 6 hours as needed for pain 180 tablet 0  . traZODone (DESYREL) 100 MG tablet Take 1 tablet (100 mg total) by mouth at bedtime. (Patient taking differently: Take 50 mg by mouth at bedtime. ) 90 tablet 3  . eltrombopag (PROMACTA) 50 MG tablet Take 1 tablet (50 mg total) by mouth daily. Take on an empty stomach 1 hour before a meal or 2 hours after 30 tablet 1   No current facility-administered medications for this visit.     REVIEW OF SYSTEMS:  Constitutional: Denies fevers, chills or  abnormal night sweats  Eyes: Denies double vision or watery eyes, blurred vision Ears, nose, mouth, throat, and face: Denies mucositis or sore throat  Respiratory: Denies cough, dyspnea or wheezes Cardiovascular: Denies palpitation, chest discomfort  Gastrointestinal:  Denies heartburn (+) intermittent diarrhea, very well controlled   Urinary: Denies frequency, urgency, burning, or incontinence.  Skin: Denies abnormal skin rashes (+) complete hair loss Lymphatics: Denies new lymphadenopathy or easy bruising Neurological:Denies numbness, tingling or new weaknesses MSK: No myalgia Behavioral/Psych: Mood is stable, no new changes All other systems were reviewed with the patient and are negative.  PHYSICAL EXAMINATION:  ECOG PERFORMANCE STATUS: 1 - Symptomatic but completely ambulatory  Vitals:   11/22/17 1114  BP: (!) 146/76  Pulse: 81  Resp: 18  Temp: 98.4 F (36.9 C)  SpO2: 99%   Filed Weights   11/22/17 1114  Weight: 139 lb 11.2 oz (63.4 kg)    GENERAL:alert, no distress and comfortable (+) hair loss SKIN: skin color, texture, turgor are normal, no rashes or significant lesions (+) clean port EYES: normal, conjunctiva are pink and non-injected, sclera clear OROPHARYNX:no exudate, no erythema and lips, buccal mucosa, and tongue normal  NECK: supple, thyroid normal size, non-tender, without nodularity LYMPH:  no palpable lymphadenopathy in the cervical, axillary or inguinal LUNGS: clear to auscultation and percussion with normal breathing effort HEART: regular rhythm and no murmurs and no lower extremity edema  ABDOMEN:abdomen soft, non-tender and normal bowel sounds Musculoskeletal:no cyanosis of digits and no clubbing  PSYCH: alert & oriented x 3 with fluent speech NEURO: no focal motor/sensory deficits  LABORATORY DATA:  I have reviewed the data as listed CBC Latest Ref Rng & Units 11/22/2017 11/02/2017 10/25/2017  WBC 3.9 - 10.3 K/uL 5.7 4.8 9.3  Hemoglobin 11.6 - 15.9  g/dL 11.8 9.9(L) 10.9(L)  Hematocrit 34.8 - 46.6 % 34.8 29.1(L) 31.7(L)  Platelets 145 - 400 K/uL 116(L) 70(L) 78(L)    CMP Latest Ref Rng & Units 11/22/2017 11/02/2017 10/25/2017  Glucose 70 - 99 mg/dL 193(H) 94 209(H)  BUN 8 - 23 mg/dL 16 6(L) 8  Creatinine 0.44 - 1.00 mg/dL 0.79 0.73 0.79  Sodium 135 - 145 mmol/L 138 138 137  Potassium 3.5 - 5.1 mmol/L 4.2 4.2 3.9  Chloride 98 - 111 mmol/L 104 105 102  CO2 22 - 32 mmol/L _0 Calcium 8.9 - 10.3 mg/dL 9.6 9.3 9.1  Total Protein 6.5 - 8.1 g/dL 6.7 6.3(L) 6.2(L)  Total Bilirubin 0.3 - 1.2 mg/dL 0.5 0.6 0.6  Alkaline Phos 38 - 126 U/L 126 166(H) 166(H)  AST 15 - 41 U/L 15 15 35  ALT 0 - 44 U/L _1 Tumor Marker CA 19-9  07/19/17: 1436 08/16/17 606  09/13/17: 170  10/11/17: 95  PATHOLOGY  08/08/2017 Molecular Pathology   Diagnosis 06/13/17 FINE NEEDLE ASPIRATION, ENDOSCOPIC, PANCREAS HEAD (SPECIMEN 1 OF 1 COLLECTED 06/13/17): ADENOCARCINOMA. Preliminary Diagnosis Intraoperative Diagnosis: 1-3) ATYPICAL CELLS, ADDITIONAL MATERIAL REQUESTED. (NDK) 4-6 ADENOCARCINOMA. (NDK) Reported to Dr. Paulita Fujita on 06/13/17 @ 11:07am.   PROCEDURES  EUS by Dr. Paulita Fujita 06/13/17  IMPRESSION:  -There was no sign of significant pathology in the ampulla. - There was no evidence of significant pathology in the left lobe of the liver. - A mass was identified in the pancreatic head. Abutment SMV; invasion portal confluence; no adenopathy. Fine needle aspiration performed. If this is a pancreatic adenocarcinoma, it would be staged T4/N0/Mx by EUS.   RADIOGRAPHIC STUDIES: I have personally reviewed the radiological images as listed and agreed with the findings in the report.   09/24/2017 CT CAP IMPRESSION: 1. There is been interval local progression of disease with increased size of head of pancreas neoplasm. There is progressive vascular involvement with partial encasement of the celiac trunk and SMA. Continued encasement and marked  narrowing of the portal venous confluence. 2. No evidence for metastatic adenopathy within the abdomen, liver metastasis or pulmonary metastasis. 3. Aortic atherosclerosis and LAD coronary artery atherosclerotic calcifications. Aortic Atherosclerosis (ICD10-I70.0).  06/22/2017 CT CAP IMPRESSION: 1. No findings suspicious for metastatic disease within the chest. 2. Tiny subpleural right lower lobe pulmonary nodule is nonspecific, but likely benign. This can be addressed on follow-up imaging. 3.  Aortic Atherosclerosis (ICD10-I70.0).   No results found.  ASSESSMENT & PLAN:  Carly [name] is a 77 y.o. Caucasian female with a history of hypothyroidism, GERD, Anxiety, osteopenia, Glaucoma, HTN, hypercholesterolemia, and H/o Uterine Cancer, presented with intermittent upper abdominal pain    1. 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  -She has locally advanced Adenocarcinoma of Pancreatic Head.  The EUS showed a 3.0 x 3.0 cm mass in the pancreatic head, with sonographic evidence suggesting invasion into the superior mesenteric artery, the portal vein,, the SMV, and splenic vein.  Unfortunately this is unresectable tumor, due to the invasion of SMA. -Staging scan was negative for metastasis. -Her case was discussed in our GI tumor board. Our surgeon Dr. Barry Dienes concurs that her pancreatic cancer is not resectable. -I previously recommended induction chemo for 4-6 months followed by radiation if no disease progression. We also discussed maintenance chemotherapy if she does not want to radiation. -The goal of therapy is palliative, we are not able to cure her cancer -She started FOLFIRINOX q2weeks on 07/19/17. She tolerated moderately well with some diarrhea and  fatigue.  -She tolerated full dose cycle 2 and 3  Overall well. She developed Thrombocytopenia after cycle 5 and I reduced dose.  -She is tolerating treatment well overall, her pain has improved, her tumor marker CA 19.9 has decreased significantly, indicating likely good response to treatment. -CT scan on 7/30 showed The mass involving the head of pancreas measures 2.5 x 3.8 by 3.1 cm, slightly bigger than before ( 2.2 x 2.6 by 3.4 cm).  No other evidence of metastasis.  However her previous scan was done one month before she started chemo, and we know imaging measurement of the pancreatic tumor sometimes could be not very accurate.  Review her case in our  GI tumor board, given her clinical response and improvement on CA19.9, we recommend her to continue current chemo. -I encouraged her to eat more and increase her nutritional supplements and watch for worsened leg swelling or pain as chemo can increase her risk for blood clots. -I also discussed other possible treatment options like consolidation radiation previously with her. I also informed her that the goal is to improve quality of life as her condition is difficult to completely cure.  -Pt is tolerating chemo well, doing well clinically.  -Labs reviewed, CBC showed platelets 116K Hg 11.8 WBCs 5.7K. CMP showed BG 193 and albumin 3.4. -Due to her worsening thrombocytopenia from chemotherapy, I recommand starting Promacta to boost her blood counts. She agreed. I prescribed today. I discussed benefits and potential side effects, especially risk of blood clots and educated her about concerning signs and symptoms.  -Lab, flush, f/u with me or Lacie and FOLFIRINOX every 2 weeks  2. 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 She should reduce sugary fruits and high carb meets and increase  vegetable.  -She was previously advised to continue to exercise as tolerable and check her Blood glucose at home in the morning and during the day. -I prescribed Metformin 571m once daily previously.  Potential side effects, especially diarrhea, reviewed with patient.  She agrees to proceed. -Her BG is high at 193 today. She said that she had a high carb breakfast. I advised her to eat less carbs and measure her BG frequently.   3. Diarrhea, Abdominal pain and weight loss -She has previously met with our dietician [place] I previously encouraged her to take a nutritional supplement -I previously reviewed her pain medication use. She can use Tramadol for moderate pain and hydrocodone for worse pain.  -She previously increased her Imodium and I prescribed Lomotil on 07/26/17 to take if imodium is not enough.   -Her intermittent diarrhea controlled with lomotil. Her pain is mild and nearly resolved. Weight is trending down, I encouraged her to increase premier supplements.   4. H/o Uterine Cancer, Stage I, diagnosed in 1981 -She had a total hysterectomy and BSO in 1981. She had no following treatments -Followed by her Gynecologist with yearly pap smears.   5. Genetic Testing  -Given her history of uterine cancer and family history of breast cancer she is eligible for genetic testing. She expressed interest and. I previously sent genetic counseling referral. -genetic test was negative   6. Social Support -She lives alone but has good familial support.  -I previously recommended that she has someone check on her weekly especially when on chemo treatment.  -I previously sent a SW referral and she has previously met with them  7. Hypokalemia   - likely related to her diarrhea  -I previously prescribed potassium chloride 20 mEq twice daily for 5 days, then to continue once daily -We will continue close monitoring - She increased to 20 MEQ twice daily at home -K normal today    8. HTN,  Hypothyroidism  -She is on HCTZ as needed and Lisinopril daily  -She is on Levothyroxine daily -I discussed with Chemo her BP may becomes low. I recommended if she develops this or dizziness I suggest she hold HCTZ first and continue to monitor her BP at home.  -Continue to Follow up with PCP  for management and medication.  - She is currently taking half dose of her BP medication because of low morning BP readings at home. -I advised her to continue monitoring with possibility of stop BP medication if BP continues to drop or is causing symptoms.  9.Goal of care discussion  -We again discussed the incurable nature of her cancer, and the overall poor prognosis, especially if she does not have good response to chemotherapy or progress on chemo -The patient understands the goal of care is palliative. -she is full code now   PLAN:  -Lab reviewed, adequate to proceed with treatment today, cycle 8 mFOLFIRINOX with further dose reduction due to her cytopenia  -Repeat scan next month, after next cycle chemo  -I prescribed Promacta today. Plan to start as soon as she receives it  -I refilled lomotil today -Lab, flush, f/u with me or Lacie and FOLFIRINOX in 2, 4 and 6 weeks    All questions were answered. The patient knows to call the clinic with any problems, questions or concerns.  I spent 20 minutes counseling the patient face to face. The total time spent in the appointment was 25 minutes and more than 50% was on counseling.  Dierdre Searles Dweik am acting as scribe for Dr. Truitt Merle.  I have reviewed the above documentation for accuracy and completeness, and I agree with the above.      Truitt Merle, MD 11/22/2017

## 2017-11-21 ENCOUNTER — Other Ambulatory Visit: Payer: Medicare Other

## 2017-11-21 ENCOUNTER — Ambulatory Visit: Payer: Medicare Other

## 2017-11-21 ENCOUNTER — Ambulatory Visit: Payer: Medicare Other | Admitting: Hematology

## 2017-11-21 DIAGNOSIS — H401132 Primary open-angle glaucoma, bilateral, moderate stage: Secondary | ICD-10-CM | POA: Diagnosis not present

## 2017-11-21 DIAGNOSIS — E119 Type 2 diabetes mellitus without complications: Secondary | ICD-10-CM | POA: Diagnosis not present

## 2017-11-21 DIAGNOSIS — H2513 Age-related nuclear cataract, bilateral: Secondary | ICD-10-CM | POA: Diagnosis not present

## 2017-11-22 ENCOUNTER — Inpatient Hospital Stay: Payer: Medicare Other

## 2017-11-22 ENCOUNTER — Inpatient Hospital Stay (HOSPITAL_BASED_OUTPATIENT_CLINIC_OR_DEPARTMENT_OTHER): Payer: Medicare Other | Admitting: Hematology

## 2017-11-22 ENCOUNTER — Encounter: Payer: Self-pay | Admitting: Hematology

## 2017-11-22 ENCOUNTER — Telehealth: Payer: Self-pay | Admitting: Hematology

## 2017-11-22 VITALS — BP 146/76 | HR 81 | Temp 98.4°F | Resp 18 | Ht 62.5 in | Wt 139.7 lb

## 2017-11-22 DIAGNOSIS — Z95828 Presence of other vascular implants and grafts: Secondary | ICD-10-CM

## 2017-11-22 DIAGNOSIS — E876 Hypokalemia: Secondary | ICD-10-CM | POA: Diagnosis not present

## 2017-11-22 DIAGNOSIS — I10 Essential (primary) hypertension: Secondary | ICD-10-CM | POA: Diagnosis not present

## 2017-11-22 DIAGNOSIS — E039 Hypothyroidism, unspecified: Secondary | ICD-10-CM

## 2017-11-22 DIAGNOSIS — Z8542 Personal history of malignant neoplasm of other parts of uterus: Secondary | ICD-10-CM

## 2017-11-22 DIAGNOSIS — C250 Malignant neoplasm of head of pancreas: Secondary | ICD-10-CM | POA: Diagnosis not present

## 2017-11-22 DIAGNOSIS — M8580 Other specified disorders of bone density and structure, unspecified site: Secondary | ICD-10-CM

## 2017-11-22 DIAGNOSIS — Z452 Encounter for adjustment and management of vascular access device: Secondary | ICD-10-CM | POA: Diagnosis not present

## 2017-11-22 DIAGNOSIS — Z5189 Encounter for other specified aftercare: Secondary | ICD-10-CM | POA: Diagnosis not present

## 2017-11-22 DIAGNOSIS — Z5111 Encounter for antineoplastic chemotherapy: Secondary | ICD-10-CM | POA: Diagnosis not present

## 2017-11-22 DIAGNOSIS — E119 Type 2 diabetes mellitus without complications: Secondary | ICD-10-CM | POA: Diagnosis not present

## 2017-11-22 DIAGNOSIS — Z7189 Other specified counseling: Secondary | ICD-10-CM

## 2017-11-22 LAB — CMP (CANCER CENTER ONLY)
ALK PHOS: 126 U/L (ref 38–126)
ALT: 13 U/L (ref 0–44)
ANION GAP: 8 (ref 5–15)
AST: 15 U/L (ref 15–41)
Albumin: 3.4 g/dL — ABNORMAL LOW (ref 3.5–5.0)
BILIRUBIN TOTAL: 0.5 mg/dL (ref 0.3–1.2)
BUN: 16 mg/dL (ref 8–23)
CALCIUM: 9.6 mg/dL (ref 8.9–10.3)
CO2: 26 mmol/L (ref 22–32)
Chloride: 104 mmol/L (ref 98–111)
Creatinine: 0.79 mg/dL (ref 0.44–1.00)
GFR, Est AFR Am: >60 mL/min (ref >60)
GFR, Estimated: >60 mL/min (ref >60)
Glucose, Bld: 193 mg/dL — ABNORMAL HIGH (ref 70–99)
Potassium: 4.2 mmol/L (ref 3.5–5.1)
Sodium: 138 mmol/L (ref 135–145)
TOTAL PROTEIN: 6.7 g/dL (ref 6.5–8.1)

## 2017-11-22 LAB — CBC WITH DIFFERENTIAL (CANCER CENTER ONLY)
BASOS ABS: 0 10*3/uL (ref 0.0–0.1)
BASOS PCT: 1 %
EOS PCT: 1 %
Eosinophils Absolute: 0.1 10*3/uL (ref 0.0–0.5)
HCT: 34.8 % (ref 34.8–46.6)
Hemoglobin: 11.8 g/dL (ref 11.6–15.9)
Lymphocytes Relative: 15 %
Lymphs Abs: 0.9 10*3/uL (ref 0.9–3.3)
MCH: 33.6 pg (ref 25.1–34.0)
MCHC: 33.9 g/dL (ref 31.5–36.0)
MCV: 98.8 fL (ref 79.5–101.0)
Monocytes Absolute: 0.5 10*3/uL (ref 0.1–0.9)
Monocytes Relative: 9 %
Neutro Abs: 4.2 10*3/uL (ref 1.5–6.5)
Neutrophils Relative %: 74 %
PLATELETS: 116 10*3/uL — AB (ref 145–400)
RBC: 3.52 MIL/uL — ABNORMAL LOW (ref 3.70–5.45)
RDW: 15.2 % — AB (ref 11.2–14.5)
WBC Count: 5.7 10*3/uL (ref 3.9–10.3)

## 2017-11-22 MED ORDER — DEXTROSE 5 % IV SOLN
INTRAVENOUS | Status: DC
  Administered 2017-11-22: 16:00:00 via INTRAVENOUS
  Filled 2017-11-22: qty 250

## 2017-11-22 MED ORDER — OXALIPLATIN CHEMO INJECTION 100 MG/20ML
50.0000 mg/m2 | Freq: Once | INTRAVENOUS | Status: AC
  Administered 2017-11-22: 85 mg via INTRAVENOUS
  Filled 2017-11-22: qty 17

## 2017-11-22 MED ORDER — ATROPINE SULFATE 1 MG/ML IJ SOLN
0.5000 mg | Freq: Once | INTRAMUSCULAR | Status: AC | PRN
  Administered 2017-11-22: 0.5 mg via INTRAVENOUS

## 2017-11-22 MED ORDER — ATROPINE SULFATE 1 MG/ML IJ SOLN
INTRAMUSCULAR | Status: AC
  Filled 2017-11-22: qty 1

## 2017-11-22 MED ORDER — DIPHENOXYLATE-ATROPINE 2.5-0.025 MG PO TABS: 1.0000 | ORAL_TABLET | Freq: Four times a day (QID) | ORAL | 1 refills | Status: DC | PRN

## 2017-11-22 MED ORDER — DEXAMETHASONE SODIUM PHOSPHATE 10 MG/ML IJ SOLN
10.0000 mg | Freq: Once | INTRAMUSCULAR | Status: AC
  Administered 2017-11-22: 10 mg via INTRAVENOUS

## 2017-11-22 MED ORDER — DEXTROSE 5 % IV SOLN
Freq: Once | INTRAVENOUS | Status: AC
  Administered 2017-11-22: 13:00:00 via INTRAVENOUS
  Filled 2017-11-22: qty 250

## 2017-11-22 MED ORDER — PALONOSETRON HCL INJECTION 0.25 MG/5ML
0.2500 mg | Freq: Once | INTRAVENOUS | Status: AC
  Administered 2017-11-22: 0.25 mg via INTRAVENOUS

## 2017-11-22 MED ORDER — SODIUM CHLORIDE 0.9% FLUSH
10.0000 mL | INTRAVENOUS | Status: DC | PRN
  Administered 2017-11-22: 10 mL
  Filled 2017-11-22: qty 10

## 2017-11-22 MED ORDER — DEXAMETHASONE SODIUM PHOSPHATE 10 MG/ML IJ SOLN
INTRAMUSCULAR | Status: AC
  Filled 2017-11-22: qty 1

## 2017-11-22 MED ORDER — SODIUM CHLORIDE 0.9 % IV SOLN
2000.0000 mg/m2 | INTRAVENOUS | Status: DC
  Administered 2017-11-22: 3400 mg via INTRAVENOUS
  Filled 2017-11-22: qty 68

## 2017-11-22 MED ORDER — IRINOTECAN HCL CHEMO INJECTION 100 MG/5ML
100.0000 mg/m2 | Freq: Once | INTRAVENOUS | Status: AC
  Administered 2017-11-22: 180 mg via INTRAVENOUS
  Filled 2017-11-22: qty 9

## 2017-11-22 MED ORDER — PALONOSETRON HCL INJECTION 0.25 MG/5ML
INTRAVENOUS | Status: AC
  Filled 2017-11-22: qty 5

## 2017-11-22 MED ORDER — ELTROMBOPAG OLAMINE 50 MG PO TABS: 50.0000 mg | ORAL_TABLET | Freq: Every day | ORAL | 1 refills | Status: DC

## 2017-11-22 MED ORDER — LEUCOVORIN CALCIUM INJECTION 350 MG
400.0000 mg/m2 | Freq: Once | INTRAVENOUS | Status: AC
  Administered 2017-11-22: 680 mg via INTRAVENOUS
  Filled 2017-11-22: qty 34

## 2017-11-22 NOTE — Progress Notes (Signed)
Vista Mink, PharmD confirmed 5FU pump dose with MD. Increase from last cycle to 2000mg /m2.  B. Corey Skains, PharmD, BCPS, BCOP

## 2017-11-22 NOTE — Telephone Encounter (Signed)
Gave pt avs and calendar  °

## 2017-11-22 NOTE — Patient Instructions (Signed)
[facility] Discharge Instructions for Patients Receiving Chemotherapy  Today you received the following chemotherapy agents: Oxaliplatin. Leucovorin, Irinotecan, 5FU  To help prevent nausea and vomiting after your treatment, we encourage you to take your nausea medication as directed.   If you develop nausea and vomiting that is not controlled by your nausea medication, call the clinic.   BELOW ARE SYMPTOMS THAT SHOULD BE REPORTED IMMEDIATELY:  *FEVER GREATER THAN 100.5 F  *CHILLS WITH OR WITHOUT FEVER  NAUSEA AND VOMITING THAT IS NOT CONTROLLED WITH YOUR NAUSEA MEDICATION  *UNUSUAL SHORTNESS OF BREATH  *UNUSUAL BRUISING OR BLEEDING  TENDERNESS IN MOUTH AND THROAT WITH OR WITHOUT PRESENCE OF ULCERS  *URINARY PROBLEMS  *BOWEL PROBLEMS  UNUSUAL RASH Items with * indicate a potential emergency and should be followed up as soon as possible.  Feel free to call the clinic should you have any questions or concerns. The clinic phone number is [phone].  Please show the [place] at check-in to the Emergency Department and triage nurse.

## 2017-11-23 ENCOUNTER — Telehealth: Payer: Self-pay

## 2017-11-23 ENCOUNTER — Telehealth: Payer: Self-pay | Admitting: Pharmacist

## 2017-11-23 DIAGNOSIS — C250 Malignant neoplasm of head of pancreas: Secondary | ICD-10-CM

## 2017-11-23 LAB — CANCER ANTIGEN 19-9: CAN 19-9: 36 U/mL — AB (ref 0–35)

## 2017-11-23 NOTE — Telephone Encounter (Signed)
Oral Oncology Pharmacist Encounter  [place] Shield for the federal employee program has denied insurance authorization for the use of Promacta for chemotherapy-induced thrombocytopenia. Appeal packet will be compiled including letter of medical necessity and clinical supporting documentation.  Per the appeal process for FEP, written request must be received from the member in order to initiate the appeal.  Called patient, left voicemail for patient to call me back so that I can discuss the above information with patient. I will need patient's permission and her signature on letter of medical necessity in order to complete the appeal packet.  Oral oncology clinic will continue to follow.  Johny Drilling, PharmD, BCPS, BCOP  11/23/2017 3:14 PM Oral Oncology Clinic [phone]

## 2017-11-23 NOTE — Telephone Encounter (Signed)
Oral Oncology Patient Advocate Encounter  Received notification from [place] that the request for prior authorization for Promacta has been denied due to does not meet indicated use guidelines.    This determination is currently being appealed.  The appeal packet was faxed to [phone].   This encounter will continue to be updated until final determination.

## 2017-11-23 NOTE — Telephone Encounter (Signed)
Oral Oncology Patient Advocate Encounter  Received notification from [facility] that prior authorization for Promacta is required.  PA submitted on CoverMyMeds Key [id number] Status is pending  Oral Oncology Clinic will continue to follow.  [facility] Patient [place] Phone [phone] Fax [phone]

## 2017-11-23 NOTE — Progress Notes (Signed)
Pt's 5-FU pump was connected after 5:30 pm on Thursday 11/22/17 (so infusion will not be complete before pt comes in at 14:00). Updated Dr. Burr Medico today who said it was fine to bolus the pt when she comes in on Saturday and waste the rest. Will pass along information to RNs working Saturday.

## 2017-11-23 NOTE — Telephone Encounter (Signed)
Oral Oncology Pharmacist Encounter  Received new prescription for Promacta (eltrombopag) for the treatment of chemotherapy-induced thrombocytopenia, planned duration until adequate platelet response or unacceptable toxicity.  Patient is being treated with FOLFIRINOX for advanced pancreatic cancer  Labs from 11/22/17 assessed, OK for treatment.  Current medication list in Epic reviewed, DDI with Promacta and calcium supplement identified:  Patient will be counseled to separate Promacta administration  at least 2 hours before or 4 hours after calcium supplement as Promacta forms covalent complex with polyvalent cations leading to decreased absorption of both agents.  Prescription will be sent to appropriate dispensing pharmacy per insurance requirement once insurance authorization is obtained.  Oral Oncology Clinic will continue to follow for insurance authorization, copayment issues, initial counseling and start date.  Johny Drilling, PharmD, BCPS, BCOP  11/23/2017 8:37 AM Oral Oncology Clinic [phone]

## 2017-11-23 NOTE — Telephone Encounter (Signed)
Oral Oncology Pharmacist Encounter  Spoke with patient. She will come the office on Monday (11/26/17), in the afternoon, to sign letter of medical necessity for Promacta appeal. We will have patient sign application for [employer] patient assistance program at that time in case appeal is denied and we need to seek compassionate use medication from the manufacturer.  Patient understands and is in agreement with above plan.  Johny Drilling, PharmD, BCPS, BCOP  11/23/2017 3:33 PM Oral Oncology Clinic [phone]

## 2017-11-24 ENCOUNTER — Inpatient Hospital Stay: Payer: Medicare Other

## 2017-11-24 VITALS — BP 134/75 | HR 90 | Temp 98.6°F | Resp 18

## 2017-11-24 DIAGNOSIS — Z7189 Other specified counseling: Secondary | ICD-10-CM

## 2017-11-24 DIAGNOSIS — C250 Malignant neoplasm of head of pancreas: Secondary | ICD-10-CM

## 2017-11-24 DIAGNOSIS — E039 Hypothyroidism, unspecified: Secondary | ICD-10-CM | POA: Diagnosis not present

## 2017-11-24 DIAGNOSIS — Z5189 Encounter for other specified aftercare: Secondary | ICD-10-CM | POA: Diagnosis not present

## 2017-11-24 DIAGNOSIS — Z5111 Encounter for antineoplastic chemotherapy: Secondary | ICD-10-CM | POA: Diagnosis not present

## 2017-11-24 DIAGNOSIS — Z452 Encounter for adjustment and management of vascular access device: Secondary | ICD-10-CM | POA: Diagnosis not present

## 2017-11-24 DIAGNOSIS — I10 Essential (primary) hypertension: Secondary | ICD-10-CM | POA: Diagnosis not present

## 2017-11-24 MED ORDER — PEGFILGRASTIM INJECTION 6 MG/0.6ML ~~LOC~~
PREFILLED_SYRINGE | SUBCUTANEOUS | Status: AC
  Filled 2017-11-24: qty 0.6

## 2017-11-24 MED ORDER — HEPARIN SOD (PORK) LOCK FLUSH 100 UNIT/ML IV SOLN
500.0000 [IU] | Freq: Once | INTRAVENOUS | Status: AC | PRN
  Administered 2017-11-24: 500 [IU]
  Filled 2017-11-24: qty 5

## 2017-11-24 MED ORDER — PEGFILGRASTIM INJECTION 6 MG/0.6ML ~~LOC~~
6.0000 mg | PREFILLED_SYRINGE | Freq: Once | SUBCUTANEOUS | Status: AC
  Administered 2017-11-24: 6 mg via SUBCUTANEOUS

## 2017-11-24 MED ORDER — SODIUM CHLORIDE 0.9% FLUSH
10.0000 mL | INTRAVENOUS | Status: DC | PRN
  Administered 2017-11-24: 10 mL
  Filled 2017-11-24: qty 10

## 2017-11-26 NOTE — Telephone Encounter (Signed)
Oral Chemotherapy Pharmacist Encounter   I spoke with patient in [facility] lobby for overview of: Promacta (eltrombopag).   We discussed off label usage of Promacta in an attempt to prevent dose reductions or dose delays in her chemotherapy administration for her advanced pancreatic cancer.  Counseled patient on administration, dosing, side effects, monitoring, drug-food interactions, safe handling, storage, and disposal.  Patient will take Promacta 50mg  tablets, 1 tablet by mouth once daily on an empty stomach, 1 hour before or 2 hours after a meal.  Patient plans to take her Promacta tablet with her Synthroid tablet in the morning on an empty stomach.  Patient counseled to administer Promacta at least 2 hours before, or 4 hours after antacids, foods high in calcium, or vitamin supplements (iron, calcium, aluminum, magnesium, selinium, zinc).  Patient plans to take her calcium supplementation at night before bed.  Promacta start date: TBD, pending medication acquisition  Adverse effects include but are not limited to: fatigue, diarrhea, nausea, pruritis, decreased blood counts, hepatotoxicity, flu-like symptoms, myalgias, fever, and peripheral edema.    Reviewed with patient importance of keeping a medication schedule and plan for any missed doses.  [name] voiced understanding and appreciation.   All questions answered. Medication reconciliation performed and medication/allergy list updated.   Patient signed letter of reconsideration for [facility] insurance appeal.  Patient informed that FEP may take up to 72 business hours to return determination of urgent appeal request.   If appeal request is approved, and insurance denial is overturned, Promacta prescription will be sent to [facility] specialty pharmacy for dispensing per insurance requirement.   If appeal request is denied, and insurance denial is upheld, Surveyor, mining will be sent to [employer] in an  attempt to enroll patient into manufacturer compassionate use program to receive Promacta at $0 out-of-pocket cost to patient.  Patient signed Ecologist today, as well.  Once final determination on appeal is received from FEP, we will update patient on continued prescription processing.  Patient knows to call the office with questions or concerns. Oral Oncology Clinic will continue to follow.  Johny Drilling, PharmD, BCPS, BCOP  11/26/2017   3:00 PM Oral Oncology Clinic [phone]

## 2017-11-26 NOTE — Telephone Encounter (Signed)
Oral Oncology Pharmacist Encounter  Appeal packet completed and faxed to [place] at [phone].  This encounter will continue to be updated until final determination.  Johny Drilling, PharmD, BCPS, BCOP  11/26/2017 2:58 PM Oral Oncology Clinic [phone]

## 2017-11-27 NOTE — Telephone Encounter (Signed)
Oral Oncology Pharmacist Encounter  I called [place] employee program authorization department at [phone] to follow-up on receipt of appeal packet for [place]. Appeal is in process, and is under external review. We should receive a final determination on this appeal within 72 business hours. They do not need any additional information from the office at this time. This encounter will continue to be updated until final determination.  Johny Drilling, PharmD, BCPS, BCOP  11/27/2017 11:19 AM Oral Oncology Clinic [phone]

## 2017-11-28 MED ORDER — ELTROMBOPAG OLAMINE 50 MG PO TABS: 50.0000 mg | ORAL_TABLET | Freq: Every day | ORAL | 1 refills | Status: DC

## 2017-11-28 NOTE — Telephone Encounter (Signed)
Oral Oncology Patient Advocate Encounter  Prior Authorization for [name] has been approved.    PA#  Effective dates: 10/29/17 through 05/27/18  Oral Oncology Clinic will continue to follow.   [facility] Patient [name] Phone [phone] Fax [phone]

## 2017-11-28 NOTE — Telephone Encounter (Signed)
Oral Oncology Pharmacist Encounter  Received call from patient with information that she had received call from her insurance company stating that they now will approve the use of Promacta. Promacta prescription has been E scribed to [facility] specialty pharmacy per insurance requirement. We have not yet received fax determination of the insurance approval. Once received, this information will be updated in patient's chart.  Johny Drilling, PharmD, BCPS, BCOP  11/28/2017 1:49 PM Oral Oncology Clinic [phone]

## 2017-11-30 ENCOUNTER — Telehealth: Payer: Self-pay | Admitting: Pharmacist

## 2017-11-30 DIAGNOSIS — C250 Malignant neoplasm of head of pancreas: Secondary | ICD-10-CM

## 2017-11-30 MED ORDER — ELTROMBOPAG OLAMINE 50 MG PO TABS: 50.0000 mg | ORAL_TABLET | Freq: Every day | ORAL | 1 refills | Status: DC

## 2017-11-30 NOTE — Telephone Encounter (Signed)
Oral Oncology Pharmacist Encounter  Received notification that Promacta prescription needs to go to a different [facility] specialty pharmacy branch than the one in [place], [place]. Promacta prescription E scribed to [facility] pharmacy in [place], [place] per insurance requirement.  Johny Drilling, PharmD, BCPS, BCOP  11/30/2017 10:17 AM Oral Oncology Clinic [phone]

## 2017-12-06 ENCOUNTER — Encounter: Payer: Self-pay | Admitting: General Practice

## 2017-12-06 NOTE — Progress Notes (Signed)
[place] Social Work Clinical Social Work was referred by patient to review and complete healthcare advance directives.  Clinical Social Worker met with patient in [place] office.  The patient designated Aniyia Rane as their primary healthcare agent and Nalda Shackleford as their secondary agent.  Patient also completed healthcare living will.    Clinical Social Worker notarized documents and made copies for patient/family. Clinical Social Worker will send documents to medical records to be scanned into patient's chart. Clinical Social Worker encouraged patient/family to contact with any additional questions or concerns.  Edwyna Shell, LCSW Clinical Social Worker Phone:  [phone]

## 2017-12-07 NOTE — Telephone Encounter (Signed)
Oral Oncology [name] Advocate Encounter  Confirmed with [facility] that Promacta was delivered to the [name] on 12/06/17  [name] [place] Phone [phone] Fax [phone]

## 2017-12-11 ENCOUNTER — Inpatient Hospital Stay: Payer: Medicare Other

## 2017-12-11 ENCOUNTER — Other Ambulatory Visit: Payer: Self-pay | Admitting: Hematology and Oncology

## 2017-12-11 ENCOUNTER — Inpatient Hospital Stay: Payer: Medicare Other | Attending: Hematology

## 2017-12-11 VITALS — BP 140/72 | HR 80 | Temp 97.8°F | Resp 18

## 2017-12-11 DIAGNOSIS — Z5189 Encounter for other specified aftercare: Secondary | ICD-10-CM | POA: Diagnosis not present

## 2017-12-11 DIAGNOSIS — C250 Malignant neoplasm of head of pancreas: Secondary | ICD-10-CM | POA: Insufficient documentation

## 2017-12-11 DIAGNOSIS — E039 Hypothyroidism, unspecified: Secondary | ICD-10-CM | POA: Insufficient documentation

## 2017-12-11 DIAGNOSIS — I10 Essential (primary) hypertension: Secondary | ICD-10-CM | POA: Insufficient documentation

## 2017-12-11 DIAGNOSIS — Z8542 Personal history of malignant neoplasm of other parts of uterus: Secondary | ICD-10-CM | POA: Diagnosis not present

## 2017-12-11 DIAGNOSIS — Z7189 Other specified counseling: Secondary | ICD-10-CM

## 2017-12-11 DIAGNOSIS — Z23 Encounter for immunization: Secondary | ICD-10-CM | POA: Insufficient documentation

## 2017-12-11 DIAGNOSIS — E119 Type 2 diabetes mellitus without complications: Secondary | ICD-10-CM | POA: Insufficient documentation

## 2017-12-11 DIAGNOSIS — E876 Hypokalemia: Secondary | ICD-10-CM | POA: Diagnosis not present

## 2017-12-11 DIAGNOSIS — D696 Thrombocytopenia, unspecified: Secondary | ICD-10-CM | POA: Diagnosis not present

## 2017-12-11 DIAGNOSIS — Z5111 Encounter for antineoplastic chemotherapy: Secondary | ICD-10-CM | POA: Diagnosis not present

## 2017-12-11 DIAGNOSIS — M8580 Other specified disorders of bone density and structure, unspecified site: Secondary | ICD-10-CM | POA: Insufficient documentation

## 2017-12-11 DIAGNOSIS — Z95828 Presence of other vascular implants and grafts: Secondary | ICD-10-CM

## 2017-12-11 DIAGNOSIS — Z452 Encounter for adjustment and management of vascular access device: Secondary | ICD-10-CM | POA: Diagnosis not present

## 2017-12-11 LAB — CBC WITH DIFFERENTIAL (CANCER CENTER ONLY)
ABS IMMATURE GRANULOCYTES: 0.04 10*3/uL (ref 0.00–0.07)
BASOS ABS: 0 10*3/uL (ref 0.0–0.1)
BASOS PCT: 0 %
EOS ABS: 0.1 10*3/uL (ref 0.0–0.5)
Eosinophils Relative: 2 %
HCT: 35.5 % — ABNORMAL LOW (ref 36.0–46.0)
Hemoglobin: 11.9 g/dL — ABNORMAL LOW (ref 12.0–15.0)
IMMATURE GRANULOCYTES: 1 %
LYMPHS ABS: 1.1 10*3/uL (ref 0.7–4.0)
Lymphocytes Relative: 16 %
MCH: 32.7 pg (ref 26.0–34.0)
MCHC: 33.5 g/dL (ref 30.0–36.0)
MCV: 97.5 fL (ref 80.0–100.0)
Monocytes Absolute: 0.8 10*3/uL (ref 0.1–1.0)
Monocytes Relative: 11 %
NEUTROS PCT: 70 %
NRBC: 0 % (ref 0.0–0.2)
Neutro Abs: 5 10*3/uL (ref 1.7–7.7)
PLATELETS: 101 10*3/uL — AB (ref 150–400)
RBC: 3.64 MIL/uL — ABNORMAL LOW (ref 3.87–5.11)
RDW: 13.9 % (ref 11.5–15.5)
WBC Count: 7 10*3/uL (ref 4.0–10.5)

## 2017-12-11 LAB — CMP (CANCER CENTER ONLY)
ALBUMIN: 3.5 g/dL (ref 3.5–5.0)
ALK PHOS: 140 U/L — AB (ref 38–126)
ALT: 13 U/L (ref 0–44)
AST: 17 U/L (ref 15–41)
Anion gap: 10 (ref 5–15)
BUN: 14 mg/dL (ref 8–23)
CALCIUM: 9.9 mg/dL (ref 8.9–10.3)
CHLORIDE: 104 mmol/L (ref 98–111)
CO2: 27 mmol/L (ref 22–32)
CREATININE: 0.75 mg/dL (ref 0.44–1.00)
GFR, Est AFR Am: >60 mL/min (ref >60)
GFR, Estimated: >60 mL/min (ref >60)
Glucose, Bld: 127 mg/dL — ABNORMAL HIGH (ref 70–99)
Potassium: 3.8 mmol/L (ref 3.5–5.1)
Sodium: 141 mmol/L (ref 135–145)
Total Bilirubin: 0.5 mg/dL (ref 0.3–1.2)
Total Protein: 6.6 g/dL (ref 6.5–8.1)

## 2017-12-11 MED ORDER — INFLUENZA VAC SPLIT QUAD 0.5 ML IM SUSY
0.5000 mL | PREFILLED_SYRINGE | Freq: Once | INTRAMUSCULAR | Status: AC
  Administered 2017-12-11: 0.5 mL via INTRAMUSCULAR

## 2017-12-11 MED ORDER — IRINOTECAN HCL CHEMO INJECTION 100 MG/5ML
100.0000 mg/m2 | Freq: Once | INTRAVENOUS | Status: AC
  Administered 2017-12-11: 180 mg via INTRAVENOUS
  Filled 2017-12-11: qty 9

## 2017-12-11 MED ORDER — DEXAMETHASONE SODIUM PHOSPHATE 10 MG/ML IJ SOLN
INTRAMUSCULAR | Status: AC
  Filled 2017-12-11: qty 1

## 2017-12-11 MED ORDER — SODIUM CHLORIDE 0.9% FLUSH
10.0000 mL | INTRAVENOUS | Status: DC | PRN
  Administered 2017-12-11: 10 mL
  Filled 2017-12-11: qty 10

## 2017-12-11 MED ORDER — OXALIPLATIN CHEMO INJECTION 100 MG/20ML
50.0000 mg/m2 | Freq: Once | INTRAVENOUS | Status: AC
  Administered 2017-12-11: 85 mg via INTRAVENOUS
  Filled 2017-12-11: qty 17

## 2017-12-11 MED ORDER — PALONOSETRON HCL INJECTION 0.25 MG/5ML
INTRAVENOUS | Status: AC
  Filled 2017-12-11: qty 5

## 2017-12-11 MED ORDER — DEXTROSE 5 % IV SOLN
Freq: Once | INTRAVENOUS | Status: AC
  Administered 2017-12-11: 10:00:00 via INTRAVENOUS
  Filled 2017-12-11: qty 250

## 2017-12-11 MED ORDER — LEUCOVORIN CALCIUM INJECTION 350 MG
400.0000 mg/m2 | Freq: Once | INTRAVENOUS | Status: AC
  Administered 2017-12-11: 680 mg via INTRAVENOUS
  Filled 2017-12-11: qty 34

## 2017-12-11 MED ORDER — ATROPINE SULFATE 1 MG/ML IJ SOLN
0.5000 mg | Freq: Once | INTRAMUSCULAR | Status: AC | PRN
  Administered 2017-12-11: 0.5 mg via INTRAVENOUS

## 2017-12-11 MED ORDER — ATROPINE SULFATE 1 MG/ML IJ SOLN
INTRAMUSCULAR | Status: AC
  Filled 2017-12-11: qty 1

## 2017-12-11 MED ORDER — INFLUENZA VAC SPLIT QUAD 0.5 ML IM SUSY
PREFILLED_SYRINGE | INTRAMUSCULAR | Status: AC
  Filled 2017-12-11: qty 0.5

## 2017-12-11 MED ORDER — SODIUM CHLORIDE 0.9 % IV SOLN
2000.0000 mg/m2 | INTRAVENOUS | Status: DC
  Administered 2017-12-11: 3400 mg via INTRAVENOUS
  Filled 2017-12-11: qty 68

## 2017-12-11 MED ORDER — DEXAMETHASONE SODIUM PHOSPHATE 10 MG/ML IJ SOLN
10.0000 mg | Freq: Once | INTRAMUSCULAR | Status: AC
  Administered 2017-12-11: 10 mg via INTRAVENOUS

## 2017-12-11 MED ORDER — PALONOSETRON HCL INJECTION 0.25 MG/5ML
0.2500 mg | Freq: Once | INTRAVENOUS | Status: AC
  Administered 2017-12-11: 0.25 mg via INTRAVENOUS

## 2017-12-11 NOTE — Patient Instructions (Signed)
[facility] Discharge Instructions for Patients Receiving Chemotherapy  Today you received the following chemotherapy agents: Oxaliplatin. Leucovorin, Irinotecan, 5FU  To help prevent nausea and vomiting after your treatment, we encourage you to take your nausea medication as directed.   If you develop nausea and vomiting that is not controlled by your nausea medication, call the clinic.   BELOW ARE SYMPTOMS THAT SHOULD BE REPORTED IMMEDIATELY:  *FEVER GREATER THAN 100.5 F  *CHILLS WITH OR WITHOUT FEVER  NAUSEA AND VOMITING THAT IS NOT CONTROLLED WITH YOUR NAUSEA MEDICATION  *UNUSUAL SHORTNESS OF BREATH  *UNUSUAL BRUISING OR BLEEDING  TENDERNESS IN MOUTH AND THROAT WITH OR WITHOUT PRESENCE OF ULCERS  *URINARY PROBLEMS  *BOWEL PROBLEMS  UNUSUAL RASH Items with * indicate a potential emergency and should be followed up as soon as possible.  Feel free to call the clinic should you have any questions or concerns. The clinic phone number is [phone].  Please show the [place] at check-in to the Emergency Department and triage nurse.

## 2017-12-13 ENCOUNTER — Inpatient Hospital Stay: Payer: Medicare Other

## 2017-12-13 VITALS — BP 138/72 | HR 78 | Temp 98.2°F | Resp 17

## 2017-12-13 DIAGNOSIS — Z23 Encounter for immunization: Secondary | ICD-10-CM | POA: Diagnosis not present

## 2017-12-13 DIAGNOSIS — E039 Hypothyroidism, unspecified: Secondary | ICD-10-CM | POA: Diagnosis not present

## 2017-12-13 DIAGNOSIS — Z452 Encounter for adjustment and management of vascular access device: Secondary | ICD-10-CM | POA: Diagnosis not present

## 2017-12-13 DIAGNOSIS — C250 Malignant neoplasm of head of pancreas: Secondary | ICD-10-CM

## 2017-12-13 DIAGNOSIS — Z5111 Encounter for antineoplastic chemotherapy: Secondary | ICD-10-CM | POA: Diagnosis not present

## 2017-12-13 DIAGNOSIS — Z7189 Other specified counseling: Secondary | ICD-10-CM

## 2017-12-13 DIAGNOSIS — Z5189 Encounter for other specified aftercare: Secondary | ICD-10-CM | POA: Diagnosis not present

## 2017-12-13 MED ORDER — PEGFILGRASTIM INJECTION 6 MG/0.6ML ~~LOC~~
PREFILLED_SYRINGE | SUBCUTANEOUS | Status: AC
  Filled 2017-12-13: qty 0.6

## 2017-12-13 MED ORDER — SODIUM CHLORIDE 0.9% FLUSH
10.0000 mL | INTRAVENOUS | Status: DC | PRN
  Administered 2017-12-13: 10 mL
  Filled 2017-12-13: qty 10

## 2017-12-13 MED ORDER — PEGFILGRASTIM INJECTION 6 MG/0.6ML ~~LOC~~
6.0000 mg | PREFILLED_SYRINGE | Freq: Once | SUBCUTANEOUS | Status: AC
  Administered 2017-12-13: 6 mg via SUBCUTANEOUS

## 2017-12-13 MED ORDER — HEPARIN SOD (PORK) LOCK FLUSH 100 UNIT/ML IV SOLN
500.0000 [IU] | Freq: Once | INTRAVENOUS | Status: AC | PRN
  Administered 2017-12-13: 500 [IU]
  Filled 2017-12-13: qty 5

## 2017-12-13 NOTE — Patient Instructions (Signed)
Pegfilgrastim injection What is this medicine? PEGFILGRASTIM (PEG fil gra stim) is a long-acting granulocyte colony-stimulating factor that stimulates the growth of neutrophils, a type of white blood cell important in the body's fight against infection. It is used to reduce the incidence of fever and infection in patients with certain types of cancer who are receiving chemotherapy that affects the bone marrow, and to increase survival after being exposed to high doses of radiation. This medicine may be used for other purposes; ask your health care provider or pharmacist if you have questions. COMMON BRAND NAME(S): Neulasta What should I tell my health care provider before I take this medicine? They need to know if you have any of these conditions: -kidney disease -latex allergy -ongoing radiation therapy -sickle cell disease -skin reactions to acrylic adhesives (On-Body Injector only) -an unusual or allergic reaction to pegfilgrastim, filgrastim, other medicines, foods, dyes, or preservatives -pregnant or trying to get pregnant -breast-feeding How should I use this medicine? This medicine is for injection under the skin. If you get this medicine at home, you will be taught how to prepare and give the pre-filled syringe or how to use the On-body Injector. Refer to the patient Instructions for Use for detailed instructions. Use exactly as directed. Tell your healthcare provider immediately if you suspect that the On-body Injector may not have performed as intended or if you suspect the use of the On-body Injector resulted in a missed or partial dose. It is important that you put your used needles and syringes in a special sharps container. Do not put them in a trash can. If you do not have a sharps container, call your pharmacist or healthcare provider to get one. Talk to your pediatrician regarding the use of this medicine in children. While this drug may be prescribed for selected conditions,  precautions do apply. Overdosage: If you think you have taken too much of this medicine contact a poison control center or emergency room at once. NOTE: This medicine is only for you. Do not share this medicine with others. What if I miss a dose? It is important not to miss your dose. Call your doctor or health care professional if you miss your dose. If you miss a dose due to an On-body Injector failure or leakage, a new dose should be administered as soon as possible using a single prefilled syringe for manual use. What may interact with this medicine? Interactions have not been studied. Give your health care provider a list of all the medicines, herbs, non-prescription drugs, or dietary supplements you use. Also tell them if you smoke, drink alcohol, or use illegal drugs. Some items may interact with your medicine. This list may not describe all possible interactions. Give your health care provider a list of all the medicines, herbs, non-prescription drugs, or dietary supplements you use. Also tell them if you smoke, drink alcohol, or use illegal drugs. Some items may interact with your medicine. What should I watch for while using this medicine? You may need blood work done while you are taking this medicine. If you are going to need a MRI, CT scan, or other procedure, tell your doctor that you are using this medicine (On-Body Injector only). What side effects may I notice from receiving this medicine? Side effects that you should report to your doctor or health care professional as soon as possible: -allergic reactions like skin rash, itching or hives, swelling of the face, lips, or tongue -dizziness -fever -pain, redness, or irritation at site   where injected -pinpoint red spots on the skin -red or dark-brown urine -shortness of breath or breathing problems -stomach or side pain, or pain at the shoulder -swelling -tiredness -trouble passing urine or change in the amount of urine Side  effects that usually do not require medical attention (report to your doctor or health care professional if they continue or are bothersome): -bone pain -muscle pain This list may not describe all possible side effects. Call your doctor for medical advice about side effects. You may report side effects to FDA at 1-800-FDA-1088. Where should I keep my medicine? Keep out of the reach of children. Store pre-filled syringes in a refrigerator between 2 and 8 degrees C (36 and 46 degrees F). Do not freeze. Keep in carton to protect from light. Throw away this medicine if it is left out of the refrigerator for more than 48 hours. Throw away any unused medicine after the expiration date. NOTE: This sheet is a summary. It may not cover all possible information. If you have questions about this medicine, talk to your doctor, pharmacist, or health care provider.  2018 [employer] (2016-02-10 12:58:03)  

## 2017-12-23 NOTE — Progress Notes (Signed)
[facility]  [phone] [phone]  Clinic Follow up Note   Patient Care Team: Darcus Austin, MD as PCP - General (Family Medicine) Arta Silence, MD as Consulting Physician (Gastroenterology) Truitt Merle, MD as Consulting Physician (Hematology) 12/24/2017  SUMMARY OF ONCOLOGIC HISTORY: Oncology History   Cancer Staging Pancreatic cancer [facility]) Staging form: Exocrine Pancreas, AJCC 8th Edition - Clinical stage from 06/13/2017: Stage III (cT4, cN0, cM0) - Signed by Truitt Merle, MD on 06/13/2017       Pancreatic cancer ([place])   06/08/2017 Imaging    06/08/2017 CT Abdomen IMPRESSION: Irregular solid hypoechoic mass within the pancreatic head, 2.6 x 2.2 cm with associated pancreatic ductal dilatation and pancreatic atrophy, most compatible with pancreatic cancer. 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  Diffuse colonic diverticulosis. Slight stranding around the distal descending colon may reflect early active diverticulitis.  Bilateral renal parapelvic cysts.  Aortic atherosclerosis.    06/13/2017 Initial Diagnosis    Pancreatic cancer ([place])    06/13/2017 Cancer Staging    Staging form: Exocrine Pancreas, AJCC 8th Edition - Clinical stage from 06/13/2017: Stage III (cT4, cN0, cM0) - Signed by Truitt Merle, MD on 06/13/2017    06/13/2017 Procedure    EUS by Dr. Paulita Fujita 06/13/17  IMPRESSION:  -There was no sign of significant pathology in the ampulla. - There was no evidence of significant pathology in the left lobe of the liver. - A mass was identified in the pancreatic head. [place] SMV; invasion portal confluence; no adenopathy. Fine needle aspiration performed. If this is a pancreatic adenocarcinoma, it would be staged T4/N0/Mx by EUS.    06/13/2017 Initial Biopsy    Diagnosis 06/13/17 FINE NEEDLE ASPIRATION, ENDOSCOPIC, PANCREAS HEAD (SPECIMEN 1 OF  1 COLLECTED 06/13/17): ADENOCARCINOMA. Preliminary Diagnosis Intraoperative Diagnosis: 1-3) ATYPICAL CELLS, ADDITIONAL MATERIAL REQUESTED. (NDK) 4-6 ADENOCARCINOMA. (NDK) Reported to Dr. Paulita Fujita on 06/13/17 @ 11:07am.     06/21/2017 Imaging    CT Chest without contrast IMPRESSION: 1. No findings suspicious for metastatic disease within the chest.  2. Tiny subpleural right lower lobe pulmonary nodule is nonspecific, but likely benign. This can be addressed on follow-up imaging.  3. Aortic Atherosclerosis (ICD10-I70.0).    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  The Hereditary Gene Panel offered by [facility] includes sequencing and/or deletion duplication testing of the following 47 genes: APC, ATM, AXIN2, BARD1, BMPR1A, BRCA1, BRCA2, BRIP1, CDH1, CDK4, CDKN2A (p14ARF), CDKN2A (p16INK4a), CHEK2, CTNNA1, DICER1, EPCAM (Deletion/duplication testing only), GREM1 (promoter region deletion/duplication testing only), KIT, MEN1, MLH1, MSH2, MSH3, MSH6, MUTYH, NBN, NF1, NHTL1, PALB2, PDGFRA, PMS2, POLD1, POLE, PTEN, RAD50, RAD51C, RAD51D, SDHB, SDHC, SDHD, SMAD4, SMARCA4. STK11, TP53, TSC1, TSC2, and VHL.  The following genes were evaluated for sequence changes only: SDHA and HOXB13 c.251G>A variant only. The report date is August 14, 2017.    09/24/2017 Progression    09/24/2017 CT CAP Pancreas: The mass involving the head of pancreas measures 2.5 x 3.8 by 3.1 cm. On the previous exam this measured 2.2 x 2.6 by 3.4 cm.    09/24/2017 Imaging    09/24/2017 CT CAP IMPRESSION: 1. There is been interval local progression of disease with increased size of head of pancreas neoplasm. There is progressive vascular involvement with partial encasement  of the celiac trunk and SMA. Continued encasement and marked narrowing of the portal venous confluence. 2. No evidence for  metastatic adenopathy within the abdomen, liver metastasis or pulmonary metastasis. 3. Aortic atherosclerosis and LAD coronary artery atherosclerotic calcifications. Aortic Atherosclerosis (ICD10-I70.0).   CURRENT THERAPY: chemo FOLFIRINOX every 2 weeks started 07/19/17. Given her thrombocytopenia I reduce her dose starting with cycle 5.   INTERVAL HISTORY: Carly [name] returns for f/u and cycle 10 FOLFIRINOX as scheduled. She completed cycle 9 on 12/11/17. She is doing well. Appetite is normal. Fatigue is worse few days after treatment and fluctuates, but remains functional. Has occasional diarrhea, anywhere from 0-3 episodes per day, controlled with lomotil. Intermittent abdominal pain with increased diarrhea, otherwise none. No blood in stool. Mouth is occasionally tender without obvious sores, better lately. Denies n/v. Ankles are slightly swollen, no calf pain. Denies fever, chills, cough, chest pain, dyspnea, or neuropathy.    MEDICAL HISTORY:  Past Medical History:  Diagnosis Date  . Allergic rhinitis    Skin Test 04-14-2009  . Asthma   . Cancer ([place]) 1981   adenocarcinoma of uterus  . 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 Family history of breast cancer   . History of uterine cancer   . Hyperlipemia   . Hypertension   . Insomnia     SURGICAL HISTORY: Past Surgical History:  Procedure Laterality Date  . ABDOMINAL HYSTERECTOMY    . CARPAL TUNNEL RELEASE    . EUS N/A 06/13/2017   Procedure: FULL UPPER ENDOSCOPIC ULTRASOUND (EUS) RADIAL;  Surgeon: Arta Silence, MD;  Location: [facility];  Service: Endoscopy;  Laterality: N/A;  . I&D EXTREMITY  09/17/2011   Procedure: IRRIGATION AND DEBRIDEMENT EXTREMITY;  Surgeon: Roseanne Kaufman, MD;  Location: [facility];  Service: Orthopedics;  Laterality: Left;  with drain placement  . IR FLUORO GUIDE PORT INSERTION RIGHT  07/18/2017  . IR US GUIDE VASC ACCESS RIGHT  07/18/2017  . NASAL SEPTOPLASTY W/  TURBINOPLASTY    . pelvic floor rebuild    . TONSILLECTOMY    . TOTAL ABDOMINAL HYSTERECTOMY W/ BILATERAL SALPINGOOPHORECTOMY      I have reviewed the social history and family history with the patient and they are unchanged from previous note.  ALLERGIES:  is allergic to cephalexin and nabumetone.  MEDICATIONS:  Current Outpatient Medications  Medication Sig Dispense Refill  . ALPRAZolam (XANAX) 0.5 MG tablet Take 1 tablet (0.5 mg total) by mouth at bedtime as needed for anxiety. 90 tablet 0  . Ascorbic Acid (VITAMIN C) 500 MG PACK Take 100 mg by mouth daily.    Marland Kitchen atorvastatin (LIPITOR) 10 MG tablet Take 10 mg by mouth at bedtime.     Marland Kitchen CALCIUM PO Take 1 tablet by mouth daily.     . Cholecalciferol (VITAMIN D) 2000 units CAPS Take by mouth.    . Cyanocobalamin (VITAMIN B-12 PO) Take 1 tablet by mouth daily.     . diphenoxylate-atropine (LOMOTIL) 2.5-0.025 MG tablet Take 1-2 tablets by mouth 4 (four) times daily as needed for diarrhea or loose stools. 60 tablet 1  . eltrombopag (PROMACTA) 50 MG tablet Take 1 tablet (50 mg total) by mouth daily. Take on an empty stomach 1 hour before a meal or 2 hours after 30 tablet 1  . hydrochlorothiazide (HYDRODIURIL) 25 MG tablet Take 1 tablet by mouth daily as needed.     . hyoscyamine (LEVBID) 0.375 MG 12 hr tablet Take  0.375 mg by mouth every 12 (twelve) hours as needed for cramping.     Marland Kitchen KLOR-CON M20 20 MEQ tablet TAKE 1 TABLET BY MOUTH TWICE A DAY 60 tablet 1  . KLOR-CON M20 20 MEQ tablet TAKE 1 TABLET (20 MEQ TOTAL) BY MOUTH ONCE FOR 1 DOSE. 30 tablet 1  . KLOR-CON M20 20 MEQ tablet TAKE 1 TABLET BY MOUTH TWICE A DAY 60 tablet 1  . levothyroxine (SYNTHROID, LEVOTHROID) 25 MCG tablet Take 25 mcg by mouth daily before breakfast.     . lidocaine-prilocaine (EMLA) cream Apply to affected area once 30 g 3  . magic mouthwash w/lidocaine SOLN Take 5 mLs by mouth 3 (three) times daily as needed for mouth pain. 240 mL 0  . metFORMIN (GLUCOPHAGE) 500 MG  tablet TAKE 1 TABLET BY MOUTH EVERY DAY WITH BREAKFAST 30 tablet 1  . Omega-3 Fatty Acids (FISH OIL PO) Take 1 capsule by mouth daily.     Marland Kitchen omeprazole (PRILOSEC) 20 MG capsule Take 1 tablet by mouth daily.    . ondansetron (ZOFRAN) 8 MG tablet Take 1 tablet (8 mg total) by mouth 2 (two) times daily as needed for refractory nausea / vomiting. Start on day 3 after chemotherapy. 30 tablet 1  . ONETOUCH DELICA LANCETS 78L MISC USE TO TEST YOUR BLOOD SUGAR ONCE A DAY FASTING AND 2 HRS AFTER A MEAL AS NEEDED  3  . ONETOUCH VERIO test strip USE TO TEST YOUR BLOOD SUGAR ONCE A DAY FASTING & 2 HRS AFTER A MEAL AS NEEDED  3  . prochlorperazine (COMPAZINE) 10 MG tablet Take 1 tablet (10 mg total) by mouth every 6 (six) hours as needed (NAUSEA). 30 tablet 2  . ROCKLATAN 0.02-0.005 % SOLN PLACE 1 DROP INTO BOTH EYES AT BEDTIME  12  . traMADol (ULTRAM) 50 MG tablet Take 1-2 tablets every 6 hours as needed for pain 180 tablet 0  . traZODone (DESYREL) 100 MG tablet Take 1 tablet (100 mg total) by mouth at bedtime. (Patient taking differently: Take 50 mg by mouth at bedtime. ) 90 tablet 3  . HYDROcodone-acetaminophen (NORCO) 5-325 MG tablet Take 1 tablet by mouth every 6 (six) hours as needed for moderate pain. 40 tablet 0   No current facility-administered medications for this visit.    Facility-Administered Medications Ordered in Other Visits  Medication Dose Route Frequency Provider Last Rate Last Dose  . dextrose 5 % solution   Intravenous Continuous Truitt Merle, MD   Stopped at 12/24/17 1459  . fluorouracil (ADRUCIL) 3,400 mg in sodium chloride 0.9 % 82 mL chemo infusion  2,000 mg/m2 (Treatment Plan Recorded) Intravenous 1 day or 1 dose Truitt Merle, MD   3,400 mg at 12/24/17 1501    PHYSICAL EXAMINATION: ECOG PERFORMANCE STATUS: 1 - Symptomatic but completely ambulatory  Vitals:   12/24/17 0844  BP: (!) 148/72  Pulse: 79  Resp: 18  Temp: 97.8 F (36.6 C)  SpO2: 98%   Filed Weights   12/24/17 0844    Weight: 141 lb 12.8 oz (64.3 kg)    GENERAL:alert, no distress and comfortable SKIN: no rashes or significant lesions EYES:  sclera clear OROPHARYNX:no thrush or ulcers LYMPH:  no palpable cervical or supraclavicular lymphadenopathy LUNGS: clear to auscultation with normal breathing effort HEART: regular rate & rhythm, trace bilateral ankle edema ABDOMEN:abdomen soft, non-tender and normal bowel sounds Musculoskeletal:no cyanosis of digits and no clubbing  NEURO: alert & oriented x 3 with fluent speech, no focal motor/sensory deficits  PAC without erythema    LABORATORY DATA:  I have reviewed the data as listed CBC Latest Ref Rng & Units 12/24/2017 12/11/2017 11/22/2017  WBC 4.0 - 10.5 K/uL 8.8 7.0 5.7  Hemoglobin 12.0 - 15.0 g/dL 11.0(L) 11.9(L) 11.8  Hematocrit 36.0 - 46.0 % 33.1(L) 35.5(L) 34.8  Platelets 150 - 400 K/uL 136(L) 101(L) 116(L)     CMP Latest Ref Rng & Units 12/24/2017 12/11/2017 11/22/2017  Glucose 70 - 99 mg/dL 126(H) 127(H) 193(H)  BUN 8 - 23 mg/dL '9 14 16  ' Creatinine 0.44 - 1.00 mg/dL 0.79 0.75 0.79  Sodium 135 - 145 mmol/L 141 141 138  Potassium 3.5 - 5.1 mmol/L 3.9 3.8 4.2  Chloride 98 - 111 mmol/L 104 104 104  CO2 22 - 32 mmol/L '28 27 26  ' Calcium 8.9 - 10.3 mg/dL 9.6 9.9 9.6  Total Protein 6.5 - 8.1 g/dL 6.4(L) 6.6 6.7  Total Bilirubin 0.3 - 1.2 mg/dL 0.5 0.5 0.5  Alkaline Phos 38 - 126 U/L 169(H) 140(H) 126  AST 15 - 41 U/L '20 17 15  ' ALT 0 - 44 U/L '13 13 13      ' RADIOGRAPHIC STUDIES: I have personally reviewed the radiological images as listed and agreed with the findings in the report. No results found.   ASSESSMENT & PLAN: Carly [name] is a 77 y.o. Caucasian female with a history of hypothyroidism, GERD, Anxiety, osteopenia, Glaucoma, HTN, hypercholesterolemia, and H/o Uterine Cancer, presented with intermittent upper abdominal pain    1. 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 Diarrhea, Abdominal pain and weight loss-She has previously met with our dietician Pamala Hurry Nef.   4. H/o Uterine Cancer, Stage I, diagnosed in 1981-She had a total hysterectomy and BSO in 1981. She had no following treatments; Followed by her Gynecologist with yearly pap smears.   5. Genetic Testing -genetic test was negative   6. Social Support-She lives alone but has good familial support. Previously sent a SW referral   7. Hypokalemia  -likely related to her diarrhea; on K 20 MEQ twice daily at home  8. HTN, Hypothyroidism She is on HCTZ as needed and Lisinopril daily, and Levothyroxine daily  9.Goal of care discussion -she is full code now   Ms. Carcione appears stable. She completed 9 cycles FOLFIRINOX chemotherapy. She is tolerating treatment well overall, with intermittent fatigue and diarrhea that is well managed with lomotil. She has no other significant toxicities. Labs reviewed, CBC and CMP adequate to proceed with cycle 10 FOLFIRINOX at current dose, she continues promacta for thrombocytopenia, PLT 136K today. She will undergo restaging CT after this cycle, ordered today. Will f/u in 2 weeks with results.   PLAN:  -Labs reviewed, proceed with cycle 10 FOLFIRINOX at current doses -Neulasta on day 3 -Restaging CT CAP w contrast after this cycle, ordered today -f/u in 2 weeks   Orders Placed This Encounter  Procedures  . CT Abdomen Pelvis W Contrast    Standing Status:   Future    Standing Expiration Date:   12/24/2018    Order Specific Question:   If indicated for the ordered procedure, I authorize the administration of contrast media per Radiology protocol    Answer:   Yes  Order Specific Question:   Preferred imaging location?     Answer:   [facility]    Order Specific Question:   Is Oral Contrast requested for this exam?    Answer:   Yes, Per Radiology protocol    Order Specific Question:   Radiology Contrast Protocol - do NOT remove file path    Answer:   [email or web address]  . CT Chest W Contrast    Standing Status:   Future    Standing Expiration Date:   12/24/2018    Order Specific Question:   If indicated for the ordered procedure, I authorize the administration of contrast media per Radiology protocol    Answer:   Yes    Order Specific Question:   Preferred imaging location?    Answer:   [facility]    Order Specific Question:   Radiology Contrast Protocol - do NOT remove file path    Answer:   \\[email or web address]   All questions were answered. The patient knows to call the clinic with any problems, questions or concerns. No barriers to learning was detected.     Alla Feeling, NP 12/24/17

## 2017-12-24 ENCOUNTER — Telehealth: Payer: Self-pay | Admitting: Hematology

## 2017-12-24 ENCOUNTER — Encounter: Payer: Self-pay | Admitting: Nurse Practitioner

## 2017-12-24 ENCOUNTER — Inpatient Hospital Stay: Payer: Medicare Other

## 2017-12-24 ENCOUNTER — Inpatient Hospital Stay (HOSPITAL_BASED_OUTPATIENT_CLINIC_OR_DEPARTMENT_OTHER): Payer: Medicare Other | Admitting: Nurse Practitioner

## 2017-12-24 VITALS — BP 148/72 | HR 79 | Temp 97.8°F | Resp 18 | Ht 62.5 in | Wt 141.8 lb

## 2017-12-24 DIAGNOSIS — D696 Thrombocytopenia, unspecified: Secondary | ICD-10-CM

## 2017-12-24 DIAGNOSIS — E119 Type 2 diabetes mellitus without complications: Secondary | ICD-10-CM

## 2017-12-24 DIAGNOSIS — M8580 Other specified disorders of bone density and structure, unspecified site: Secondary | ICD-10-CM | POA: Diagnosis not present

## 2017-12-24 DIAGNOSIS — I10 Essential (primary) hypertension: Secondary | ICD-10-CM | POA: Diagnosis not present

## 2017-12-24 DIAGNOSIS — C250 Malignant neoplasm of head of pancreas: Secondary | ICD-10-CM

## 2017-12-24 DIAGNOSIS — Z5189 Encounter for other specified aftercare: Secondary | ICD-10-CM | POA: Diagnosis not present

## 2017-12-24 DIAGNOSIS — D490 Neoplasm of unspecified behavior of digestive system: Secondary | ICD-10-CM

## 2017-12-24 DIAGNOSIS — Z23 Encounter for immunization: Secondary | ICD-10-CM | POA: Diagnosis not present

## 2017-12-24 DIAGNOSIS — Z95828 Presence of other vascular implants and grafts: Secondary | ICD-10-CM

## 2017-12-24 DIAGNOSIS — E876 Hypokalemia: Secondary | ICD-10-CM

## 2017-12-24 DIAGNOSIS — Z5111 Encounter for antineoplastic chemotherapy: Secondary | ICD-10-CM | POA: Diagnosis not present

## 2017-12-24 DIAGNOSIS — Z8542 Personal history of malignant neoplasm of other parts of uterus: Secondary | ICD-10-CM

## 2017-12-24 DIAGNOSIS — Z452 Encounter for adjustment and management of vascular access device: Secondary | ICD-10-CM | POA: Diagnosis not present

## 2017-12-24 DIAGNOSIS — E039 Hypothyroidism, unspecified: Secondary | ICD-10-CM

## 2017-12-24 DIAGNOSIS — Z7189 Other specified counseling: Secondary | ICD-10-CM

## 2017-12-24 LAB — CMP (CANCER CENTER ONLY)
ALT: 13 U/L (ref 0–44)
AST: 20 U/L (ref 15–41)
Albumin: 3.3 g/dL — ABNORMAL LOW (ref 3.5–5.0)
Alkaline Phosphatase: 169 U/L — ABNORMAL HIGH (ref 38–126)
Anion gap: 9 (ref 5–15)
BUN: 9 mg/dL (ref 8–23)
CHLORIDE: 104 mmol/L (ref 98–111)
CO2: 28 mmol/L (ref 22–32)
Calcium: 9.6 mg/dL (ref 8.9–10.3)
Creatinine: 0.79 mg/dL (ref 0.44–1.00)
Glucose, Bld: 126 mg/dL — ABNORMAL HIGH (ref 70–99)
POTASSIUM: 3.9 mmol/L (ref 3.5–5.1)
Sodium: 141 mmol/L (ref 135–145)
Total Bilirubin: 0.5 mg/dL (ref 0.3–1.2)
Total Protein: 6.4 g/dL — ABNORMAL LOW (ref 6.5–8.1)

## 2017-12-24 LAB — CBC WITH DIFFERENTIAL (CANCER CENTER ONLY)
ABS IMMATURE GRANULOCYTES: 0.14 10*3/uL — AB (ref 0.00–0.07)
Basophils Absolute: 0 10*3/uL (ref 0.0–0.1)
Basophils Relative: 0 %
EOS PCT: 1 %
Eosinophils Absolute: 0.1 10*3/uL (ref 0.0–0.5)
HCT: 33.1 % — ABNORMAL LOW (ref 36.0–46.0)
Hemoglobin: 11 g/dL — ABNORMAL LOW (ref 12.0–15.0)
Immature Granulocytes: 2 %
LYMPHS PCT: 12 %
Lymphs Abs: 1.1 10*3/uL (ref 0.7–4.0)
MCH: 32.4 pg (ref 26.0–34.0)
MCHC: 33.2 g/dL (ref 30.0–36.0)
MCV: 97.6 fL (ref 80.0–100.0)
Monocytes Absolute: 0.8 10*3/uL (ref 0.1–1.0)
Monocytes Relative: 9 %
NEUTROS ABS: 6.7 10*3/uL (ref 1.7–7.7)
NRBC: 0 % (ref 0.0–0.2)
Neutrophils Relative %: 76 %
PLATELETS: 136 10*3/uL — AB (ref 150–400)
RBC: 3.39 MIL/uL — AB (ref 3.87–5.11)
RDW: 14.2 % (ref 11.5–15.5)
WBC: 8.8 10*3/uL (ref 4.0–10.5)

## 2017-12-24 MED ORDER — ATROPINE SULFATE 1 MG/ML IJ SOLN
INTRAMUSCULAR | Status: AC
  Filled 2017-12-24: qty 1

## 2017-12-24 MED ORDER — DEXAMETHASONE SODIUM PHOSPHATE 10 MG/ML IJ SOLN
10.0000 mg | Freq: Once | INTRAMUSCULAR | Status: AC
  Administered 2017-12-24: 10 mg via INTRAVENOUS

## 2017-12-24 MED ORDER — DEXTROSE 5 % IV SOLN
INTRAVENOUS | Status: DC
  Administered 2017-12-24: 10:00:00 via INTRAVENOUS
  Filled 2017-12-24: qty 250

## 2017-12-24 MED ORDER — PALONOSETRON HCL INJECTION 0.25 MG/5ML
0.2500 mg | Freq: Once | INTRAVENOUS | Status: AC
  Administered 2017-12-24: 0.25 mg via INTRAVENOUS

## 2017-12-24 MED ORDER — SODIUM CHLORIDE 0.9% FLUSH
10.0000 mL | INTRAVENOUS | Status: DC | PRN
  Administered 2017-12-24: 10 mL
  Filled 2017-12-24: qty 10

## 2017-12-24 MED ORDER — DEXTROSE 5 % IV SOLN
Freq: Once | INTRAVENOUS | Status: AC
  Administered 2017-12-24: 10:00:00 via INTRAVENOUS
  Filled 2017-12-24: qty 250

## 2017-12-24 MED ORDER — LEUCOVORIN CALCIUM INJECTION 350 MG
400.0000 mg/m2 | Freq: Once | INTRAVENOUS | Status: AC
  Administered 2017-12-24: 680 mg via INTRAVENOUS
  Filled 2017-12-24: qty 34

## 2017-12-24 MED ORDER — PALONOSETRON HCL INJECTION 0.25 MG/5ML
INTRAVENOUS | Status: AC
  Filled 2017-12-24: qty 5

## 2017-12-24 MED ORDER — SODIUM CHLORIDE 0.9 % IV SOLN
2000.0000 mg/m2 | INTRAVENOUS | Status: DC
  Administered 2017-12-24: 3400 mg via INTRAVENOUS
  Filled 2017-12-24: qty 68

## 2017-12-24 MED ORDER — OXALIPLATIN CHEMO INJECTION 100 MG/20ML
50.0000 mg/m2 | Freq: Once | INTRAVENOUS | Status: AC
  Administered 2017-12-24: 85 mg via INTRAVENOUS
  Filled 2017-12-24: qty 17

## 2017-12-24 MED ORDER — DEXAMETHASONE SODIUM PHOSPHATE 10 MG/ML IJ SOLN
INTRAMUSCULAR | Status: AC
  Filled 2017-12-24: qty 1

## 2017-12-24 MED ORDER — IRINOTECAN HCL CHEMO INJECTION 100 MG/5ML
100.0000 mg/m2 | Freq: Once | INTRAVENOUS | Status: AC
  Administered 2017-12-24: 180 mg via INTRAVENOUS
  Filled 2017-12-24: qty 9

## 2017-12-24 MED ORDER — ATROPINE SULFATE 1 MG/ML IJ SOLN
0.5000 mg | Freq: Once | INTRAMUSCULAR | Status: AC | PRN
  Administered 2017-12-24: 0.5 mg via INTRAVENOUS

## 2017-12-24 NOTE — Patient Instructions (Signed)
[facility] Discharge Instructions for Patients Receiving Chemotherapy  Today you received the following chemotherapy agents Oxaliplatin (Eloxatin), Leucovorin, Irinotecan (Camptosar) & Fluorouracil (Adrucil).  To help prevent nausea and vomiting after your treatment, we encourage you to take your nausea medication as prescribed.   If you develop nausea and vomiting that is not controlled by your nausea medication, call the clinic.   BELOW ARE SYMPTOMS THAT SHOULD BE REPORTED IMMEDIATELY:  *FEVER GREATER THAN 100.5 F  *CHILLS WITH OR WITHOUT FEVER  NAUSEA AND VOMITING THAT IS NOT CONTROLLED WITH YOUR NAUSEA MEDICATION  *UNUSUAL SHORTNESS OF BREATH  *UNUSUAL BRUISING OR BLEEDING  TENDERNESS IN MOUTH AND THROAT WITH OR WITHOUT PRESENCE OF ULCERS  *URINARY PROBLEMS  *BOWEL PROBLEMS  UNUSUAL RASH Items with * indicate a potential emergency and should be followed up as soon as possible.  Feel free to call the clinic should you have any questions or concerns. The clinic phone number is [phone].  Please show the [facility] at check-in to the Emergency Department and triage nurse.

## 2017-12-24 NOTE — Telephone Encounter (Signed)
Gave pt avs and calendar. Pt f/u not sched yet for 11/25.  Waiting for approval from md.

## 2017-12-25 ENCOUNTER — Telehealth: Payer: Self-pay

## 2017-12-25 LAB — CANCER ANTIGEN 19-9: CA 19-9: 23 U/mL (ref 0–35)

## 2017-12-25 NOTE — Telephone Encounter (Signed)
Called patient instructed to stop Metformin day of scan and resume in 48 hours, okay to continue Promacta per Cira Rue NP

## 2017-12-25 NOTE — Telephone Encounter (Signed)
Patient calls stating she is having the CT scan done on 11/7 and wants to know if she needs to be off the Metformin and Promacta prior to the scan.

## 2017-12-26 ENCOUNTER — Inpatient Hospital Stay: Payer: Medicare Other

## 2017-12-26 VITALS — BP 129/66 | HR 88 | Temp 98.1°F | Resp 18

## 2017-12-26 DIAGNOSIS — Z7189 Other specified counseling: Secondary | ICD-10-CM

## 2017-12-26 DIAGNOSIS — Z5111 Encounter for antineoplastic chemotherapy: Secondary | ICD-10-CM | POA: Diagnosis not present

## 2017-12-26 DIAGNOSIS — C250 Malignant neoplasm of head of pancreas: Secondary | ICD-10-CM

## 2017-12-26 DIAGNOSIS — Z452 Encounter for adjustment and management of vascular access device: Secondary | ICD-10-CM | POA: Diagnosis not present

## 2017-12-26 DIAGNOSIS — Z23 Encounter for immunization: Secondary | ICD-10-CM | POA: Diagnosis not present

## 2017-12-26 DIAGNOSIS — Z5189 Encounter for other specified aftercare: Secondary | ICD-10-CM | POA: Diagnosis not present

## 2017-12-26 DIAGNOSIS — E039 Hypothyroidism, unspecified: Secondary | ICD-10-CM | POA: Diagnosis not present

## 2017-12-26 MED ORDER — SODIUM CHLORIDE 0.9% FLUSH
10.0000 mL | INTRAVENOUS | Status: DC | PRN
  Administered 2017-12-26: 10 mL
  Filled 2017-12-26: qty 10

## 2017-12-26 MED ORDER — PEGFILGRASTIM INJECTION 6 MG/0.6ML ~~LOC~~
PREFILLED_SYRINGE | SUBCUTANEOUS | Status: AC
  Filled 2017-12-26: qty 0.6

## 2017-12-26 MED ORDER — HEPARIN SOD (PORK) LOCK FLUSH 100 UNIT/ML IV SOLN
500.0000 [IU] | Freq: Once | INTRAVENOUS | Status: AC | PRN
  Administered 2017-12-26: 500 [IU]
  Filled 2017-12-26: qty 5

## 2017-12-26 MED ORDER — PEGFILGRASTIM INJECTION 6 MG/0.6ML ~~LOC~~
6.0000 mg | PREFILLED_SYRINGE | Freq: Once | SUBCUTANEOUS | Status: AC
  Administered 2017-12-26: 6 mg via SUBCUTANEOUS

## 2017-12-28 ENCOUNTER — Other Ambulatory Visit: Payer: Self-pay | Admitting: Hematology

## 2018-01-03 ENCOUNTER — Ambulatory Visit (HOSPITAL_COMMUNITY)
Admission: RE | Admit: 2018-01-03 | Discharge: 2018-01-03 | Disposition: A | Discharge status: Home / Self Care | Payer: Medicare Other | Source: Ambulatory Visit | Attending: Nurse Practitioner | Admitting: Nurse Practitioner

## 2018-01-03 ENCOUNTER — Other Ambulatory Visit: Payer: Self-pay | Admitting: Nurse Practitioner

## 2018-01-03 DIAGNOSIS — K769 Liver disease, unspecified: Secondary | ICD-10-CM | POA: Diagnosis not present

## 2018-01-03 DIAGNOSIS — C259 Malignant neoplasm of pancreas, unspecified: Secondary | ICD-10-CM | POA: Diagnosis not present

## 2018-01-03 DIAGNOSIS — R918 Other nonspecific abnormal finding of lung field: Secondary | ICD-10-CM | POA: Insufficient documentation

## 2018-01-03 DIAGNOSIS — R161 Splenomegaly, not elsewhere classified: Secondary | ICD-10-CM | POA: Insufficient documentation

## 2018-01-03 DIAGNOSIS — D490 Neoplasm of unspecified behavior of digestive system: Secondary | ICD-10-CM | POA: Diagnosis not present

## 2018-01-03 DIAGNOSIS — I700 Atherosclerosis of aorta: Secondary | ICD-10-CM | POA: Diagnosis not present

## 2018-01-03 DIAGNOSIS — K869 Disease of pancreas, unspecified: Secondary | ICD-10-CM | POA: Diagnosis not present

## 2018-01-03 MED ORDER — IOHEXOL 300 MG/ML  SOLN
100.0000 mL | Freq: Once | INTRAMUSCULAR | Status: AC | PRN
  Administered 2018-01-03: 100 mL via INTRAVENOUS

## 2018-01-03 MED ORDER — SODIUM CHLORIDE (PF) 0.9 % IJ SOLN
INTRAMUSCULAR | Status: AC
  Filled 2018-01-03: qty 50

## 2018-01-03 MED ORDER — ALPRAZOLAM 0.5 MG PO TABS: 0.5000 mg | ORAL_TABLET | Freq: Every evening | ORAL | 0 refills | Status: DC | PRN

## 2018-01-06 NOTE — Progress Notes (Signed)
[place]  [phone] [phone]  Clinic Follow Up Note   Patient Care Team: Darcus Austin, MD as PCP - General (Family Medicine) Arta Silence, MD as Consulting Physician (Gastroenterology) Truitt Merle, MD as Consulting Physician (Hematology).  Date of Service:  01/07/2018  CHIEF COMPLAINTS:  F/u for Pancreatic Cancer, stage III   Oncology History   Cancer Staging Pancreatic cancer [facility]) Staging form: Exocrine Pancreas, AJCC 8th Edition - Clinical stage from 06/13/2017: Stage III (cT4, cN0, cM0) - Signed by Truitt Merle, MD on 06/13/2017       Pancreatic cancer ([place])   06/08/2017 Imaging    06/08/2017 CT Abdomen IMPRESSION: Irregular solid hypoechoic mass within the pancreatic head, 2.6 x 2.2 cm with associated pancreatic ductal dilatation and pancreatic atrophy, most compatible with pancreatic cancer. 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  Diffuse colonic diverticulosis. Slight stranding around the distal descending colon may reflect early active diverticulitis.  Bilateral renal parapelvic cysts.  Aortic atherosclerosis.    06/13/2017 Initial Diagnosis    Pancreatic cancer ([place])    06/13/2017 Cancer Staging    Staging form: Exocrine Pancreas, AJCC 8th Edition - Clinical stage from 06/13/2017: Stage III (cT4, cN0, cM0) - Signed by Truitt Merle, MD on 06/13/2017    06/13/2017 Procedure    EUS by Dr. Paulita Fujita 06/13/17  IMPRESSION:  -There was no sign of significant pathology in the ampulla. - There was no evidence of significant pathology in the left lobe of the liver. - A mass was identified in the pancreatic head. Abutment SMV; invasion portal confluence; no adenopathy. Fine needle aspiration performed. If this is a pancreatic adenocarcinoma, it would be staged T4/N0/Mx by EUS.    06/13/2017 Initial Biopsy    Diagnosis 06/13/17 FINE NEEDLE  ASPIRATION, ENDOSCOPIC, PANCREAS HEAD (SPECIMEN 1 OF 1 COLLECTED 06/13/17): ADENOCARCINOMA. Preliminary Diagnosis Intraoperative Diagnosis: 1-3) ATYPICAL CELLS, ADDITIONAL MATERIAL REQUESTED. (NDK) 4-6 ADENOCARCINOMA. (NDK) Reported to Dr. Paulita Fujita on 06/13/17 @ 11:07am.     06/21/2017 Imaging    CT Chest without contrast IMPRESSION: 1. No findings suspicious for metastatic disease within the chest.  2. Tiny subpleural right lower lobe pulmonary nodule is nonspecific, but likely benign. This can be addressed on follow-up imaging.  3. Aortic Atherosclerosis (ICD10-I70.0).    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  The Hereditary Gene Panel offered by [facility] includes sequencing and/or deletion duplication testing of the following 47 genes: APC, ATM, AXIN2, BARD1, BMPR1A, BRCA1, BRCA2, BRIP1, CDH1, CDK4, CDKN2A (p14ARF), CDKN2A (p16INK4a), CHEK2, CTNNA1, DICER1, EPCAM (Deletion/duplication testing only), GREM1 (promoter region deletion/duplication testing only), KIT, MEN1, MLH1, MSH2, MSH3, MSH6, MUTYH, NBN, NF1, NHTL1, PALB2, PDGFRA, PMS2, POLD1, POLE, PTEN, RAD50, RAD51C, RAD51D, SDHB, SDHC, SDHD, SMAD4, SMARCA4. STK11, TP53, TSC1, TSC2, and VHL.  The following genes were evaluated for sequence changes only: SDHA and HOXB13 c.251G>A variant only. The report date is August 14, 2017.    09/24/2017 Progression    09/24/2017 CT CAP Pancreas: The mass involving the head of pancreas measures 2.5 x 3.8 by 3.1 cm. On the previous exam this measured 2.2 x 2.6 by 3.4 cm.    09/24/2017 Imaging    09/24/2017 CT CAP IMPRESSION: 1. There is been interval local progression of disease with increased size of  head of pancreas neoplasm. There is progressive vascular involvement with partial encasement of the celiac trunk and SMA. Continued encasement and marked narrowing of  the portal venous confluence. 2. No evidence for metastatic adenopathy within the abdomen, liver metastasis or pulmonary metastasis. 3. Aortic atherosclerosis and LAD coronary artery atherosclerotic calcifications. Aortic Atherosclerosis (ICD10-I70.0).    01/04/2018 Imaging    01/04/2018 CT CAP IMPRESSION: 1. No local disease progression identified. The pancreatic head mass is ill-defined and appears slightly smaller. There is chronic partial encasement of the celiac trunk and SMA and chronic short segment occlusion at the SMV/portal vein confluence. 2. No definite evidence of metastatic disease. Stable small hypervascular lesion inferiorly in the right hepatic lobe, likely an incidental vascular lesion. There is slightly greater mesenteric nodularity posterior to the transverse colon, but no other definite signs of peritoneal carcinomatosis. Attention on follow-up recommended. 3. Stable scattered tiny subpleural pulmonary nodules bilaterally, likely benign based on stability, although attention on follow-up recommended. 4. Mild splenomegaly. 5. Aortic Atherosclerosis (ICD10-I70.0). Port-A-Cath tip in the mid right atrium, stable.      HISTORY OF PRESENTING ILLNESS: 06/14/17 Carly [name] 77 y.o. female is a here because of newly diagnosed Pancreatic Cancer. The patient was referred by Dr. Paulita Fujita. The patient presents to the clinic today accompanied by her 2 sons and her daughter-in-law.   She was having aching of her left abdominal starting 3 months ago after UTI. This pain comes and goes, which worsened at night. She denies issues with her appetite overall but she does not eat as much as she did due to the recent death of her husband. She denies any abdominal bloating. The color of urine has not become dark.   Today the patient notes she still has left abdominal pain. She notes she plans to see her grandson in [place] this weekend. She plans to go to the beach for 1-2 weeks in  May, 2018. She prefers to be reached by her home phone and then her daughter-in-law.  Socially she lives by herself since her husband's passing in 12/2016. Her house is 1 level. She has support from her son and his wife. She has good family support. She has a stationary bike he rides 30 minutes a day and keeps track of her steps per day. She is very active and goes out often. 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   In the past the patient had a complete hysterectomy in early 1981 for bleeding. During her surgery she had cancer cells found and had a BSO. She had no following treatments. She had a pelvic rebuilt in 1990s due to prolapse. She had a yearly pap smear. She is diagnosed os type II DM but is not on any medication at this time.  She takes tramadol for the pain as needed, trazodone to help her sleep and takes XANAX for her anxiety. She has been on this for years. She has been on Trazodone for 40 years to also help her anxiety. She takes Lipitor for her high cholesterol, not regularly. She takes HCTZ for her fluid retention which she takes as needed. She takes Lisinopril for her HTN. She takes Synthroid for her hypothyroidism.  Her maternal grandmother had breast cancer. Her maternal cousin had breast cancer and her paternal cousin had a blood disorder. She smoked for 10-15 years of 1/2-1 pack a day before stopping in 1980s.   On review of symptoms, pt notes left abdominal ache which  she tried tylenol and she was recently given Tramadol. This helped her some. She denies abdominal bloating, dark urine or jaundice and denies dysuria.  CURRENT THERAPY: chemo FOLFIRINOX every 2 weeks started 07/19/17. Given her thrombocytopenia I reduce her dose starting with cycle 5.   INTERVAL HISTORY:   Carly [name] is here for follow-up. She saw NP Lacie in the interim, and was noted to be doing well.  Today, he is here her family member. She is tolerating  treatment well with mild diarrhea. Her diarrhea is intermittent and comes on 2-3 times a day after chemo. She experiences cramps with diarrhea, and she takes pain medications for relief. She denies abdominal pain without diarrhea. She is physically active. She sometimes feels tired after chemo and takes naps, but is otherwise no fatigued. She wants a chemo break in December. She denies tingling or numbness in her extremities.    MEDICAL HISTORY:  Past Medical History:  Diagnosis Date  . Allergic rhinitis    Skin Test 04-14-2009  . Asthma   . Cancer ([place]) 1981   adenocarcinoma of uterus  . Diabetes mellitus without complication ([place])    type II, no meds per pt  . Family history of breast cancer   . History of uterine cancer   . Hyperlipemia   . Hypertension   . Insomnia     SURGICAL HISTORY: Past Surgical History:  Procedure Laterality Date  . ABDOMINAL HYSTERECTOMY    . CARPAL TUNNEL RELEASE    . EUS N/A 06/13/2017   Procedure: FULL UPPER ENDOSCOPIC ULTRASOUND (EUS) RADIAL;  Surgeon: Arta Silence, MD;  Location: [facility];  Service: Endoscopy;  Laterality: N/A;  . I&D EXTREMITY  09/17/2011   Procedure: IRRIGATION AND DEBRIDEMENT EXTREMITY;  Surgeon: Roseanne Kaufman, MD;  Location: [facility];  Service: Orthopedics;  Laterality: Left;  with drain placement  . IR FLUORO GUIDE PORT INSERTION RIGHT  07/18/2017  . IR US GUIDE VASC ACCESS RIGHT  07/18/2017  . NASAL SEPTOPLASTY W/ TURBINOPLASTY    . pelvic floor rebuild    . TONSILLECTOMY    . TOTAL ABDOMINAL HYSTERECTOMY W/ BILATERAL SALPINGOOPHORECTOMY      SOCIAL HISTORY: Social History   Socioeconomic History  . Marital status: Widowed    Spouse name: Not on file  . Number of children: Not on file  . Years of education: Not on file  . Highest education level: Not on file  Occupational History  . Not on file  Social Needs  . Financial resource strain: Not on file  . Food insecurity:    Worry: Not on file    Inability: Not  on file  . Transportation needs:    Medical: Not on file    Non-medical: Not on file  Tobacco Use  . Smoking status: Former Smoker    Packs/day: 0.50    Years: 15.00    Pack years: 7.50    Types: Cigarettes    Last attempt to quit: 02/27/1978    Years since quitting: 39.8  . Smokeless tobacco: Never Used  Substance and Sexual Activity  . Alcohol use: Yes    Alcohol/week: 0.0 standard drinks    Comment: occ- glass of wine  . Drug use: No  . Sexual activity: Yes    Comment: 1st intercouse- 15, partners- 1  Lifestyle  . Physical activity:    Days per week: Not on file    Minutes per session: Not on file  . Stress: Not on file  Relationships  .  Social connections:    Talks on phone: Not on file    Gets together: Not on file    Attends religious service: Not on file    Active member of club or organization: Not on file    Attends meetings of clubs or organizations: Not on file    Relationship status: Not on file  . Intimate partner violence:    Fear of current or ex partner: Not on file    Emotionally abused: Not on file    Physically abused: Not on file    Forced sexual activity: Not on file  Other Topics Concern  . Not on file  Social History Narrative  . Not on file    FAMILY HISTORY: Family History  Problem Relation Age of Onset  . Stroke Mother 46  . Heart attack Father 20  . Dementia Brother   . Hypertension Brother   . Skin cancer Brother   . Breast cancer Maternal Grandmother        dx >50  . Breast cancer Cousin        mat first cousin, dx in her 22s  . Cancer Cousin        oral cancer  . Breast cancer Cousin        mat first cousin, dx in her 31s  . Heart disease Maternal Uncle   . Other Maternal Grandfather        suicide  . Heart disease Paternal Grandmother     ALLERGIES:  is allergic to cephalexin and nabumetone.  MEDICATIONS:  Current Outpatient Medications  Medication Sig Dispense Refill  . ALPRAZolam (XANAX) 0.5 MG tablet Take 1 tablet  (0.5 mg total) by mouth at bedtime as needed for anxiety. 90 tablet 0  . Ascorbic Acid (VITAMIN C) 500 MG PACK Take 100 mg by mouth daily.    Marland Kitchen atorvastatin (LIPITOR) 10 MG tablet Take 10 mg by mouth at bedtime.     Marland Kitchen CALCIUM PO Take 1 tablet by mouth daily.     . Cholecalciferol (VITAMIN D) 2000 units CAPS Take by mouth.    . Cyanocobalamin (VITAMIN B-12 PO) Take 1 tablet by mouth daily.     . diphenoxylate-atropine (LOMOTIL) 2.5-0.025 MG tablet Take 1-2 tablets by mouth 4 (four) times daily as needed for diarrhea or loose stools. 60 tablet 1  . eltrombopag (PROMACTA) 50 MG tablet Take 1 tablet (50 mg total) by mouth daily. Take on an empty stomach 1 hour before a meal or 2 hours after 30 tablet 1  . hydrochlorothiazide (HYDRODIURIL) 25 MG tablet Take 1 tablet by mouth daily as needed.     Marland Kitchen HYDROcodone-acetaminophen (NORCO) 5-325 MG tablet Take 1 tablet by mouth every 6 (six) hours as needed for moderate pain. 40 tablet 0  . hyoscyamine (LEVBID) 0.375 MG 12 hr tablet Take 0.375 mg by mouth every 12 (twelve) hours as needed for cramping.     Marland Kitchen KLOR-CON M20 20 MEQ tablet TAKE 1 TABLET BY MOUTH TWICE A DAY 60 tablet 1  . KLOR-CON M20 20 MEQ tablet TAKE 1 TABLET (20 MEQ TOTAL) BY MOUTH ONCE FOR 1 DOSE. 30 tablet 1  . KLOR-CON M20 20 MEQ tablet TAKE 1 TABLET BY MOUTH TWICE A DAY 60 tablet 1  . levothyroxine (SYNTHROID, LEVOTHROID) 25 MCG tablet Take 25 mcg by mouth daily before breakfast.     . lidocaine-prilocaine (EMLA) cream Apply to affected area once 30 g 3  . magic mouthwash w/lidocaine SOLN Take 5 mLs  by mouth 3 (three) times daily as needed for mouth pain. 240 mL 0  . metFORMIN (GLUCOPHAGE) 500 MG tablet TAKE 1 TABLET BY MOUTH EVERY DAY WITH BREAKFAST 30 tablet 1  . Omega-3 Fatty Acids (FISH OIL PO) Take 1 capsule by mouth daily.     Marland Kitchen omeprazole (PRILOSEC) 20 MG capsule Take 1 tablet by mouth daily.    . ondansetron (ZOFRAN) 8 MG tablet Take 1 tablet (8 mg total) by mouth 2 (two) times  daily as needed for refractory nausea / vomiting. Start on day 3 after chemotherapy. 30 tablet 1  . ONETOUCH DELICA LANCETS 00B MISC USE TO TEST YOUR BLOOD SUGAR ONCE A DAY FASTING AND 2 HRS AFTER A MEAL AS NEEDED  3  . ONETOUCH VERIO test strip USE TO TEST YOUR BLOOD SUGAR ONCE A DAY FASTING & 2 HRS AFTER A MEAL AS NEEDED  3  . prochlorperazine (COMPAZINE) 10 MG tablet Take 1 tablet (10 mg total) by mouth every 6 (six) hours as needed (NAUSEA). 30 tablet 2  . ROCKLATAN 0.02-0.005 % SOLN PLACE 1 DROP INTO BOTH EYES AT BEDTIME  12  . traMADol (ULTRAM) 50 MG tablet Take 1-2 tablets every 6 hours as needed for pain 180 tablet 0  . traZODone (DESYREL) 100 MG tablet Take 1 tablet (100 mg total) by mouth at bedtime. (Patient taking differently: Take 50 mg by mouth at bedtime. ) 90 tablet 3   No current facility-administered medications for this visit.    Facility-Administered Medications Ordered in Other Visits  Medication Dose Route Frequency Provider Last Rate Last Dose  . 0.9 %  sodium chloride infusion   Intravenous Continuous Truitt Merle, MD 20 mL/hr at 01/07/18 1011    . atropine injection 0.5 mg  0.5 mg Intravenous Once PRN Truitt Merle, MD      . fluorouracil (ADRUCIL) 3,400 mg in sodium chloride 0.9 % 82 mL chemo infusion  2,000 mg/m2 (Treatment Plan Recorded) Intravenous 1 day or 1 dose Truitt Merle, MD      . irinotecan (CAMPTOSAR) 180 mg in dextrose 5 % 500 mL chemo infusion  100 mg/m2 (Treatment Plan Recorded) Intravenous Once Truitt Merle, MD      . leucovorin 680 mg in dextrose 5 % 250 mL infusion  400 mg/m2 (Treatment Plan Recorded) Intravenous Once Truitt Merle, MD      . oxaliplatin (ELOXATIN) 85 mg in dextrose 5 % 500 mL chemo infusion  50 mg/m2 (Treatment Plan Recorded) Intravenous Once Truitt Merle, MD 259 mL/hr at 01/07/18 1103 85 mg at 01/07/18 1103    REVIEW OF SYSTEMS:  Constitutional: Denies fevers, chills or abnormal night sweats  Eyes: Denies double vision or watery eyes, blurred  vision Ears, nose, mouth, throat, and face: Denies mucositis or sore throat  Respiratory: Denies cough, dyspnea or wheezes Cardiovascular: Denies palpitation, chest discomfort  Gastrointestinal:  Denies heartburn (+) intermittent diarrhea with cramps, controlled   Urinary: Denies frequency, urgency, burning, or incontinence.  Skin: Denies abnormal skin rashes (+) complete hair loss Lymphatics: Denies new lymphadenopathy or easy bruising Neurological:Denies numbness, tingling or new weaknesses MSK: No myalgia Behavioral/Psych: Mood is stable, no new changes All other systems were reviewed with the patient and are negative.  PHYSICAL EXAMINATION:  ECOG PERFORMANCE STATUS: 1 - Symptomatic but completely ambulatory  Vitals:   01/07/18 0859  BP: (!) 158/78  Pulse: 76  Resp: 17  Temp: 98.3 F (36.8 C)  SpO2: 97%   Filed Weights   01/07/18 0859  Weight: 141 lb 12.8 oz (64.3 kg)    GENERAL:alert, no distress and comfortable (+) hair loss SKIN: skin color, texture, turgor are normal, no rashes or significant lesions (+) clean port EYES: normal, conjunctiva are pink and non-injected, sclera clear OROPHARYNX:no exudate, no erythema and lips, buccal mucosa, and tongue normal  NECK: supple, thyroid normal size, non-tender, without nodularity LYMPH:  no palpable lymphadenopathy in the cervical, axillary or inguinal LUNGS: clear to auscultation and percussion with normal breathing effort HEART: regular rhythm and no murmurs and no lower extremity edema  ABDOMEN:abdomen soft, non-tender and normal bowel sounds Musculoskeletal:no cyanosis of digits and no clubbing  PSYCH: alert & oriented x 3 with fluent speech NEURO: no focal motor/sensory deficits  LABORATORY DATA:  I have reviewed the data as listed CBC Latest Ref Rng & Units 01/07/2018 12/24/2017 12/11/2017  WBC 4.0 - 10.5 K/uL 8.2 8.8 7.0  Hemoglobin 12.0 - 15.0 g/dL 10.6(L) 11.0(L) 11.9(L)  Hematocrit 36.0 - 46.0 % 31.9(L)  33.1(L) 35.5(L)  Platelets 150 - 400 K/uL 109(L) 136(L) 101(L)    CMP Latest Ref Rng & Units 01/07/2018 12/24/2017 12/11/2017  Glucose 70 - 99 mg/dL 129(H) 126(H) 127(H)  BUN 8 - 23 mg/dL '9 9 14  ' Creatinine 0.44 - 1.00 mg/dL 0.78 0.79 0.75  Sodium 135 - 145 mmol/L 139 141 141  Potassium 3.5 - 5.1 mmol/L 3.8 3.9 3.8  Chloride 98 - 111 mmol/L 102 104 104  CO2 22 - 32 mmol/L '28 28 27  ' Calcium 8.9 - 10.3 mg/dL 9.1 9.6 9.9  Total Protein 6.5 - 8.1 g/dL 6.2(L) 6.4(L) 6.6  Total Bilirubin 0.3 - 1.2 mg/dL 0.5 0.5 0.5  Alkaline Phos 38 - 126 U/L 163(H) 169(H) 140(H)  AST 15 - 41 U/L '17 20 17  ' ALT 0 - 44 U/L '12 13 13   ' Tumor Marker CA 19-9  07/19/17: 1436 08/16/17 606  09/13/17: 170  10/11/17: 95 11/22/17: 36 12/24/17: 23  PATHOLOGY  08/08/2017 Molecular Pathology   Diagnosis 06/13/17 FINE NEEDLE ASPIRATION, ENDOSCOPIC, PANCREAS HEAD (SPECIMEN 1 OF 1 COLLECTED 06/13/17): ADENOCARCINOMA. Preliminary Diagnosis Intraoperative Diagnosis: 1-3) ATYPICAL CELLS, ADDITIONAL MATERIAL REQUESTED. (NDK) 4-6 ADENOCARCINOMA. (NDK) Reported to Dr. Paulita Fujita on 06/13/17 @ 11:07am.   PROCEDURES  EUS by Dr. Paulita Fujita 06/13/17  IMPRESSION:  -There was no sign of significant pathology in the ampulla. - There was no evidence of significant pathology in the left lobe of the liver. - A mass was identified in the pancreatic head. Abutment SMV; invasion portal confluence; no adenopathy. Fine needle aspiration performed. If this is a pancreatic adenocarcinoma, it would be staged T4/N0/Mx by EUS.   RADIOGRAPHIC STUDIES: I have personally reviewed the radiological images as listed and agreed with the findings in the report.  01/04/2018 CT CAP IMPRESSION: 1. No local disease progression identified. The pancreatic head mass is ill-defined and appears slightly smaller. There is chronic partial encasement of the celiac trunk and SMA and chronic short segment occlusion at the SMV/portal vein confluence. 2. No  definite evidence of metastatic disease. Stable small hypervascular lesion inferiorly in the right hepatic lobe, likely an incidental vascular lesion. There is slightly greater mesenteric nodularity posterior to the transverse colon, but no other definite signs of peritoneal carcinomatosis. Attention on follow-up recommended. 3. Stable scattered tiny subpleural pulmonary nodules bilaterally, likely benign based on stability, although attention on follow-up recommended. 4. Mild splenomegaly. 5. Aortic Atherosclerosis (ICD10-I70.0). Port-A-Cath tip in the mid right atrium, stable.  09/24/2017 CT CAP IMPRESSION: 1. There is been  interval local progression of disease with increased size of head of pancreas neoplasm. There is progressive vascular involvement with partial encasement of the celiac trunk and SMA. Continued encasement and marked narrowing of the portal venous confluence. 2. No evidence for metastatic adenopathy within the abdomen, liver metastasis or pulmonary metastasis. 3. Aortic atherosclerosis and LAD coronary artery atherosclerotic calcifications. Aortic Atherosclerosis (ICD10-I70.0).  06/22/2017 CT CAP IMPRESSION: 1. No findings suspicious for metastatic disease within the chest. 2. Tiny subpleural right lower lobe pulmonary nodule is nonspecific, but likely benign. This can be addressed on follow-up imaging. 3.  Aortic Atherosclerosis (ICD10-I70.0).     ASSESSMENT & PLAN:  Carly [name] is a 77 y.o. Caucasian female with a history of hypothyroidism, GERD, Anxiety, osteopenia, Glaucoma, HTN, hypercholesterolemia, and H/o Uterine Cancer, presented with intermittent upper abdominal pain    1. 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  -She has locally advanced Adenocarcinoma of Pancreatic Head.  The EUS showed a 3.0 x 3.0 cm mass in the pancreatic head, with sonographic evidence suggesting invasion into the superior mesenteric artery, the portal vein,, the SMV, and splenic vein.  Unfortunately this is unresectable tumor, due to the invasion of SMA. -Staging scan was negative for metastasis. -Her case was discussed in our GI tumor board. Our surgeon Dr. Barry Dienes concurs that her pancreatic cancer is not resectable. -I previously recommended induction chemo for 4-6 months followed by radiation if no disease progression. We also discussed maintenance chemotherapy if she does not want to radiation. -The goal of therapy is palliative, we are not able to cure her cancer -She started FOLFIRINOX q2weeks on 07/19/17. She tolerated moderately well with some diarrhea and fatigue.  -She tolerated full dose cycle 2 and 3  Overall well. She developed Thrombocytopenia after cycle 5 and I reduced dose.  -She is tolerating treatment well overall, her pain has improved, her tumor marker CA 19.9 has decreased significantly, indicating likely good response to treatment. -CT scan on 7/30 showed The mass involving the head of pancreas measures 2.5 x 3.8 by 3.1 cm, slightly bigger than before ( 2.2 x 2.6 by 3.4 cm).  No other evidence of metastasis.  However her previous scan was done one month before she started chemo, and we know imaging measurement of the pancreatic tumor sometimes could be not very accurate.  Review her case in our GI tumor board, given her clinical response and improvement on CA19.9, we recommend her to continue current chemo. --Due to her cytopenias, chemo dose was reduced.  Pt is tolerating chemo well, doing well clinically.  -She is on Promacta for chemo induced thrombocytopenia, responding well, will continue -I reviewed and discussed her most recent CT results from 01/04/2018 that showed no local disease progression.  The  primary pancreatic tumor appears to be slightly smaller, no liver or other distant metastasis.  Her tumor marker CA 19.9 has dropped to normal range, which responded to good response to chemotherapy. -I encouraged her to continue chemotherapy, since she is responding and tolerating well.  We discussed a chemo break, or consolidation radiation, if she wants a longer break.  Discussed chemo and radiation are not curative, we also discussed second surgical opinion at at the [facility], she is not interested in surgery at this point.  She agrees to  continue chemo. -Labs reviewed, CBC showed Hg 10.6 PLT 109K. CMP ALP 163. -She wants a chemo break in December  -Lab, flush, f/u with me or Lacie and FOLFIRINOX every 2 weeks  2. 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 She should reduce sugary fruits and high carb meets and increase vegetable.  -She was previously advised to continue to exercise as tolerable and check her Blood glucose at home in the morning and during the day. -I previously prescribed Metformin 550m once daily previously.  Potential side effects, especially diarrhea, reviewed with patient.  She agrees to proceed. -Her BG is high at 129 today.    3. Diarrhea, Abdominal pain and weight loss -She has previously met with our dietician [facility] I previously encouraged her to take a nutritional supplement -I previously reviewed her pain medication use. She can use Tramadol for moderate pain and hydrocodone for worse pain.  -She previously increased her Imodium and I prescribed Lomotil on 07/26/17 to take if imodium is not enough.   -Her intermittent diarrhea controlled with lomotil. Her pain is mild and nearly resolved. Weight is trending down, I encouraged her to increase premier supplements.   4. H/o Uterine Cancer, Stage  I, diagnosed in 1981 -She had a total hysterectomy and BSO in 1981. She had no following treatments -Followed by her Gynecologist with yearly pap smears.   5. Genetic Testing  -Given her history of uterine cancer and family history of breast cancer she is eligible for genetic testing. She expressed interest and. I previously sent genetic counseling referral. -genetic test was negative   6. Social Support -She lives alone but has good familial support.  -I previously recommended that she has someone check on her weekly especially when on chemo treatment.  -I previously sent a SW referral and she has previously met with them  7. Hypokalemia   - likely related to her diarrhea  -I previously prescribed potassium chloride 20 mEq twice daily for 5 days, then to continue once daily -We will continue close monitoring - She increased to 20 MEQ twice daily at home -K normal today    8. HTN, Hypothyroidism  -She is on HCTZ as needed and Lisinopril daily  -She is on Levothyroxine daily -I discussed with Chemo her BP may becomes low. I recommended if she develops this or dizziness I suggest she hold HCTZ first and continue to monitor her BP at home.  -Continue to Follow up with PCP for management and medication.  - She is currently taking half dose of her BP medication because of low morning BP readings at home. -I advised her to continue monitoring with possibility of stop BP medication if BP continues to drop or is causing symptoms.  9.Goal of care discussion  -We again discussed the incurable nature of her cancer, and the overall poor prognosis, especially if she does not have good response to chemotherapy or progress on chemo -The patient understands the goal of care is palliative. -she is full code now   PLAN:  -Restaging CT scan reviewed, stable disease overall.  Lab reviewed, adequate to proceed with treatment today, cycle 11. -Continue chemotherapy every 2 weeks for 2 more cycles,  then she will skip 1 cycle in December for holiday -I refilled Lomotil  -f/u in 2 weeks   All questions were  answered. The patient knows to call the clinic with any problems, questions or concerns.  I spent 20 minutes counseling the patient face to face. The total time spent in the appointment was 25 minutes and more than 50% was on counseling.  Dierdre Searles Dweik am acting as scribe for Dr. Truitt Merle.  I have reviewed the above documentation for accuracy and completeness, and I agree with the above.      Truitt Merle, MD 01/07/2018

## 2018-01-07 ENCOUNTER — Inpatient Hospital Stay: Payer: Medicare Other | Attending: Hematology

## 2018-01-07 ENCOUNTER — Telehealth: Payer: Self-pay | Admitting: Hematology

## 2018-01-07 ENCOUNTER — Inpatient Hospital Stay: Payer: Medicare Other

## 2018-01-07 ENCOUNTER — Inpatient Hospital Stay (HOSPITAL_BASED_OUTPATIENT_CLINIC_OR_DEPARTMENT_OTHER): Payer: Medicare Other | Admitting: Hematology

## 2018-01-07 ENCOUNTER — Encounter: Payer: Self-pay | Admitting: Hematology

## 2018-01-07 VITALS — BP 158/78 | HR 76 | Temp 98.3°F | Resp 17 | Ht 62.0 in | Wt 141.8 lb

## 2018-01-07 DIAGNOSIS — E039 Hypothyroidism, unspecified: Secondary | ICD-10-CM | POA: Diagnosis not present

## 2018-01-07 DIAGNOSIS — C250 Malignant neoplasm of head of pancreas: Secondary | ICD-10-CM

## 2018-01-07 DIAGNOSIS — R197 Diarrhea, unspecified: Secondary | ICD-10-CM

## 2018-01-07 DIAGNOSIS — E876 Hypokalemia: Secondary | ICD-10-CM | POA: Diagnosis not present

## 2018-01-07 DIAGNOSIS — I10 Essential (primary) hypertension: Secondary | ICD-10-CM

## 2018-01-07 DIAGNOSIS — E119 Type 2 diabetes mellitus without complications: Secondary | ICD-10-CM

## 2018-01-07 DIAGNOSIS — Z8542 Personal history of malignant neoplasm of other parts of uterus: Secondary | ICD-10-CM | POA: Insufficient documentation

## 2018-01-07 DIAGNOSIS — Z5111 Encounter for antineoplastic chemotherapy: Secondary | ICD-10-CM | POA: Diagnosis not present

## 2018-01-07 DIAGNOSIS — Z452 Encounter for adjustment and management of vascular access device: Secondary | ICD-10-CM | POA: Insufficient documentation

## 2018-01-07 DIAGNOSIS — Z5189 Encounter for other specified aftercare: Secondary | ICD-10-CM | POA: Insufficient documentation

## 2018-01-07 DIAGNOSIS — Z7189 Other specified counseling: Secondary | ICD-10-CM

## 2018-01-07 DIAGNOSIS — Z95828 Presence of other vascular implants and grafts: Secondary | ICD-10-CM

## 2018-01-07 LAB — CMP (CANCER CENTER ONLY)
ALBUMIN: 3.3 g/dL — AB (ref 3.5–5.0)
ALT: 12 U/L (ref 0–44)
AST: 17 U/L (ref 15–41)
Alkaline Phosphatase: 163 U/L — ABNORMAL HIGH (ref 38–126)
Anion gap: 9 (ref 5–15)
BUN: 9 mg/dL (ref 8–23)
CHLORIDE: 102 mmol/L (ref 98–111)
CO2: 28 mmol/L (ref 22–32)
Calcium: 9.1 mg/dL (ref 8.9–10.3)
Creatinine: 0.78 mg/dL (ref 0.44–1.00)
GFR, Estimated: >60 mL/min (ref >60)
GLUCOSE: 129 mg/dL — AB (ref 70–99)
Potassium: 3.8 mmol/L (ref 3.5–5.1)
Sodium: 139 mmol/L (ref 135–145)
Total Bilirubin: 0.5 mg/dL (ref 0.3–1.2)
Total Protein: 6.2 g/dL — ABNORMAL LOW (ref 6.5–8.1)

## 2018-01-07 LAB — CBC WITH DIFFERENTIAL (CANCER CENTER ONLY)
Abs Immature Granulocytes: 0.17 10*3/uL — ABNORMAL HIGH (ref 0.00–0.07)
BASOS ABS: 0 10*3/uL (ref 0.0–0.1)
Basophils Relative: 0 %
EOS PCT: 1 %
Eosinophils Absolute: 0.1 10*3/uL (ref 0.0–0.5)
HEMATOCRIT: 31.9 % — AB (ref 36.0–46.0)
HEMOGLOBIN: 10.6 g/dL — AB (ref 12.0–15.0)
IMMATURE GRANULOCYTES: 2 %
LYMPHS ABS: 1.1 10*3/uL (ref 0.7–4.0)
LYMPHS PCT: 13 %
MCH: 32.4 pg (ref 26.0–34.0)
MCHC: 33.2 g/dL (ref 30.0–36.0)
MCV: 97.6 fL (ref 80.0–100.0)
MONOS PCT: 10 %
Monocytes Absolute: 0.8 10*3/uL (ref 0.1–1.0)
NRBC: 0 % (ref 0.0–0.2)
Neutro Abs: 6.1 10*3/uL (ref 1.7–7.7)
Neutrophils Relative %: 74 %
Platelet Count: 109 10*3/uL — ABNORMAL LOW (ref 150–400)
RBC: 3.27 MIL/uL — ABNORMAL LOW (ref 3.87–5.11)
RDW: 14.9 % (ref 11.5–15.5)
WBC Count: 8.2 10*3/uL (ref 4.0–10.5)

## 2018-01-07 MED ORDER — IRINOTECAN HCL CHEMO INJECTION 100 MG/5ML
100.0000 mg/m2 | Freq: Once | INTRAVENOUS | Status: AC
  Administered 2018-01-07: 180 mg via INTRAVENOUS
  Filled 2018-01-07: qty 9

## 2018-01-07 MED ORDER — LEUCOVORIN CALCIUM INJECTION 350 MG
400.0000 mg/m2 | Freq: Once | INTRAVENOUS | Status: DC
  Filled 2018-01-07: qty 34

## 2018-01-07 MED ORDER — DIPHENOXYLATE-ATROPINE 2.5-0.025 MG PO TABS: 1.0000 | ORAL_TABLET | Freq: Four times a day (QID) | ORAL | 1 refills | Status: DC | PRN

## 2018-01-07 MED ORDER — LEUCOVORIN CALCIUM INJECTION 350 MG
400.0000 mg/m2 | Freq: Once | INTRAVENOUS | Status: AC
  Administered 2018-01-07: 680 mg via INTRAVENOUS
  Filled 2018-01-07: qty 34

## 2018-01-07 MED ORDER — PALONOSETRON HCL INJECTION 0.25 MG/5ML
INTRAVENOUS | Status: AC
  Filled 2018-01-07: qty 5

## 2018-01-07 MED ORDER — SODIUM CHLORIDE 0.9% FLUSH
10.0000 mL | INTRAVENOUS | Status: DC | PRN
  Administered 2018-01-07: 10 mL
  Filled 2018-01-07: qty 10

## 2018-01-07 MED ORDER — ATROPINE SULFATE 1 MG/ML IJ SOLN
0.5000 mg | Freq: Once | INTRAMUSCULAR | Status: AC | PRN
  Administered 2018-01-07: 0.5 mg via INTRAVENOUS

## 2018-01-07 MED ORDER — OXALIPLATIN CHEMO INJECTION 100 MG/20ML
50.0000 mg/m2 | Freq: Once | INTRAVENOUS | Status: AC
  Administered 2018-01-07: 85 mg via INTRAVENOUS
  Filled 2018-01-07: qty 17

## 2018-01-07 MED ORDER — DEXTROSE 5 % IV SOLN
Freq: Once | INTRAVENOUS | Status: AC
  Administered 2018-01-07: 10:00:00 via INTRAVENOUS
  Filled 2018-01-07: qty 250

## 2018-01-07 MED ORDER — PALONOSETRON HCL INJECTION 0.25 MG/5ML
0.2500 mg | Freq: Once | INTRAVENOUS | Status: AC
  Administered 2018-01-07: 0.25 mg via INTRAVENOUS

## 2018-01-07 MED ORDER — DEXAMETHASONE SODIUM PHOSPHATE 10 MG/ML IJ SOLN
INTRAMUSCULAR | Status: AC
  Filled 2018-01-07: qty 1

## 2018-01-07 MED ORDER — ATROPINE SULFATE 1 MG/ML IJ SOLN
INTRAMUSCULAR | Status: AC
  Filled 2018-01-07: qty 1

## 2018-01-07 MED ORDER — SODIUM CHLORIDE 0.9 % IV SOLN
INTRAVENOUS | Status: DC
  Administered 2018-01-07: 10:00:00 via INTRAVENOUS
  Filled 2018-01-07: qty 250

## 2018-01-07 MED ORDER — DEXAMETHASONE SODIUM PHOSPHATE 10 MG/ML IJ SOLN
10.0000 mg | Freq: Once | INTRAMUSCULAR | Status: AC
  Administered 2018-01-07: 10 mg via INTRAVENOUS

## 2018-01-07 MED ORDER — SODIUM CHLORIDE 0.9 % IV SOLN
2000.0000 mg/m2 | INTRAVENOUS | Status: DC
  Administered 2018-01-07: 3400 mg via INTRAVENOUS
  Filled 2018-01-07: qty 68

## 2018-01-07 NOTE — Telephone Encounter (Signed)
Scheduled appt per 11/11 los - pt aware of appt date and time - per pt no print out needed.  Pt aware of appts.

## 2018-01-07 NOTE — Patient Instructions (Signed)
[facility] Discharge Instructions for Patients Receiving Chemotherapy  Today you received the following chemotherapy agents :  Oxaliplatin,  Irinotecan, Leucovorin, Fluorouracil.  To help prevent nausea and vomiting after your treatment, we encourage you to take your nausea medication as prescribed.   If you develop nausea and vomiting that is not controlled by your nausea medication, call the clinic.   BELOW ARE SYMPTOMS THAT SHOULD BE REPORTED IMMEDIATELY:  *FEVER GREATER THAN 100.5 F  *CHILLS WITH OR WITHOUT FEVER  NAUSEA AND VOMITING THAT IS NOT CONTROLLED WITH YOUR NAUSEA MEDICATION  *UNUSUAL SHORTNESS OF BREATH  *UNUSUAL BRUISING OR BLEEDING  TENDERNESS IN MOUTH AND THROAT WITH OR WITHOUT PRESENCE OF ULCERS  *URINARY PROBLEMS  *BOWEL PROBLEMS  UNUSUAL RASH Items with * indicate a potential emergency and should be followed up as soon as possible.  Feel free to call the clinic should you have any questions or concerns. The clinic phone number is [phone].  Please show the CHEMO ALERT CARD at check-in to the Emergency Department and triage nurse.   

## 2018-01-09 ENCOUNTER — Inpatient Hospital Stay: Payer: Medicare Other

## 2018-01-09 VITALS — BP 145/76 | HR 77 | Temp 98.1°F | Resp 18

## 2018-01-09 DIAGNOSIS — C250 Malignant neoplasm of head of pancreas: Secondary | ICD-10-CM | POA: Diagnosis not present

## 2018-01-09 DIAGNOSIS — Z7189 Other specified counseling: Secondary | ICD-10-CM

## 2018-01-09 DIAGNOSIS — Z5111 Encounter for antineoplastic chemotherapy: Secondary | ICD-10-CM | POA: Diagnosis not present

## 2018-01-09 DIAGNOSIS — Z452 Encounter for adjustment and management of vascular access device: Secondary | ICD-10-CM | POA: Diagnosis not present

## 2018-01-09 DIAGNOSIS — E876 Hypokalemia: Secondary | ICD-10-CM | POA: Diagnosis not present

## 2018-01-09 DIAGNOSIS — Z5189 Encounter for other specified aftercare: Secondary | ICD-10-CM | POA: Diagnosis not present

## 2018-01-09 DIAGNOSIS — Z8542 Personal history of malignant neoplasm of other parts of uterus: Secondary | ICD-10-CM | POA: Diagnosis not present

## 2018-01-09 MED ORDER — HEPARIN SOD (PORK) LOCK FLUSH 100 UNIT/ML IV SOLN
500.0000 [IU] | Freq: Once | INTRAVENOUS | Status: AC | PRN
  Administered 2018-01-09: 500 [IU]
  Filled 2018-01-09: qty 5

## 2018-01-09 MED ORDER — PEGFILGRASTIM INJECTION 6 MG/0.6ML ~~LOC~~
PREFILLED_SYRINGE | SUBCUTANEOUS | Status: AC
  Filled 2018-01-09: qty 0.6

## 2018-01-09 MED ORDER — PEGFILGRASTIM INJECTION 6 MG/0.6ML ~~LOC~~
6.0000 mg | PREFILLED_SYRINGE | Freq: Once | SUBCUTANEOUS | Status: AC
  Administered 2018-01-09: 6 mg via SUBCUTANEOUS

## 2018-01-09 MED ORDER — SODIUM CHLORIDE 0.9% FLUSH
10.0000 mL | INTRAVENOUS | Status: DC | PRN
  Administered 2018-01-09: 10 mL
  Filled 2018-01-09: qty 10

## 2018-01-15 ENCOUNTER — Other Ambulatory Visit: Payer: Self-pay | Admitting: Hematology

## 2018-01-15 DIAGNOSIS — C250 Malignant neoplasm of head of pancreas: Secondary | ICD-10-CM

## 2018-01-16 ENCOUNTER — Other Ambulatory Visit: Payer: Self-pay | Admitting: Hematology

## 2018-01-16 DIAGNOSIS — K219 Gastro-esophageal reflux disease without esophagitis: Secondary | ICD-10-CM | POA: Diagnosis not present

## 2018-01-16 DIAGNOSIS — E7800 Pure hypercholesterolemia, unspecified: Secondary | ICD-10-CM | POA: Diagnosis not present

## 2018-01-16 DIAGNOSIS — I10 Essential (primary) hypertension: Secondary | ICD-10-CM | POA: Diagnosis not present

## 2018-01-16 DIAGNOSIS — E039 Hypothyroidism, unspecified: Secondary | ICD-10-CM | POA: Diagnosis not present

## 2018-01-19 NOTE — Progress Notes (Signed)
[place]  [phone] [phone]  Clinic Follow up Note   Patient Care Team: Darcus Austin, MD as PCP - General (Family Medicine) Arta Silence, MD as Consulting Physician (Gastroenterology) Truitt Merle, MD as Consulting Physician (Hematology) 01/19/2018   Chief Complaint: F/u on pancreatic cancer  SUMMARY OF ONCOLOGIC HISTORY: Oncology History   Cancer Staging Pancreatic cancer [facility]) Staging form: Exocrine Pancreas, AJCC 8th Edition - Clinical stage from 06/13/2017: Stage III (cT4, cN0, cM0) - Signed by Truitt Merle, MD on 06/13/2017       Pancreatic cancer ([place])   06/08/2017 Imaging    06/08/2017 CT Abdomen IMPRESSION: Irregular solid hypoechoic mass within the pancreatic head, 2.6 x 2.2 cm with associated pancreatic ductal dilatation and pancreatic atrophy, most compatible with pancreatic cancer. 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  Diffuse colonic diverticulosis. Slight stranding around the distal descending colon may reflect early active diverticulitis.  Bilateral renal parapelvic cysts.  Aortic atherosclerosis.    06/13/2017 Initial Diagnosis    Pancreatic cancer ([place])    06/13/2017 Cancer Staging    Staging form: Exocrine Pancreas, AJCC 8th Edition - Clinical stage from 06/13/2017: Stage III (cT4, cN0, cM0) - Signed by Truitt Merle, MD on 06/13/2017    06/13/2017 Procedure    EUS by Dr. Paulita Fujita 06/13/17  IMPRESSION:  -There was no sign of significant pathology in the ampulla. - There was no evidence of significant pathology in the left lobe of the liver. - A mass was identified in the pancreatic head. Abutment SMV; invasion portal confluence; no adenopathy. Fine needle aspiration performed. If this is a pancreatic adenocarcinoma, it would be staged T4/N0/Mx by EUS.    06/13/2017 Initial Biopsy    Diagnosis 06/13/17 FINE NEEDLE  ASPIRATION, ENDOSCOPIC, PANCREAS HEAD (SPECIMEN 1 OF 1 COLLECTED 06/13/17): ADENOCARCINOMA. Preliminary Diagnosis Intraoperative Diagnosis: 1-3) ATYPICAL CELLS, ADDITIONAL MATERIAL REQUESTED. (NDK) 4-6 ADENOCARCINOMA. (NDK) Reported to Dr. Paulita Fujita on 06/13/17 @ 11:07am.     06/21/2017 Imaging    CT Chest without contrast IMPRESSION: 1. No findings suspicious for metastatic disease within the chest.  2. Tiny subpleural right lower lobe pulmonary nodule is nonspecific, but likely benign. This can be addressed on follow-up imaging.  3. Aortic Atherosclerosis (ICD10-I70.0).    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  The Hereditary Gene Panel offered by [facility] includes sequencing and/or deletion duplication testing of the following 47 genes: APC, ATM, AXIN2, BARD1, BMPR1A, BRCA1, BRCA2, BRIP1, CDH1, CDK4, CDKN2A (p14ARF), CDKN2A (p16INK4a), CHEK2, CTNNA1, DICER1, EPCAM (Deletion/duplication testing only), GREM1 (promoter region deletion/duplication testing only), KIT, MEN1, MLH1, MSH2, MSH3, MSH6, MUTYH, NBN, NF1, NHTL1, PALB2, PDGFRA, PMS2, POLD1, POLE, PTEN, RAD50, RAD51C, RAD51D, SDHB, SDHC, SDHD, SMAD4, SMARCA4. STK11, TP53, TSC1, TSC2, and VHL.  The following genes were evaluated for sequence changes only: SDHA and HOXB13 c.251G>A variant only. The report date is August 14, 2017.    09/24/2017 Progression    09/24/2017 CT CAP Pancreas: The mass involving the head of pancreas measures 2.5 x 3.8 by 3.1 cm. On the previous exam this measured 2.2 x 2.6 by 3.4 cm.    09/24/2017 Imaging    09/24/2017 CT CAP IMPRESSION: 1. There is been interval local progression of disease with increased size of head of pancreas neoplasm.  There is progressive vascular involvement with partial encasement of the celiac trunk and SMA. Continued encasement and marked narrowing of  the portal venous confluence. 2. No evidence for metastatic adenopathy within the abdomen, liver metastasis or pulmonary metastasis. 3. Aortic atherosclerosis and LAD coronary artery atherosclerotic calcifications. Aortic Atherosclerosis (ICD10-I70.0).    01/04/2018 Imaging    01/04/2018 CT CAP IMPRESSION: 1. No local disease progression identified. The pancreatic head mass is ill-defined and appears slightly smaller. There is chronic partial encasement of the celiac trunk and SMA and chronic short segment occlusion at the SMV/portal vein confluence. 2. No definite evidence of metastatic disease. Stable small hypervascular lesion inferiorly in the right hepatic lobe, likely an incidental vascular lesion. There is slightly greater mesenteric nodularity posterior to the transverse colon, but no other definite signs of peritoneal carcinomatosis. Attention on follow-up recommended. 3. Stable scattered tiny subpleural pulmonary nodules bilaterally, likely benign based on stability, although attention on follow-up recommended. 4. Mild splenomegaly. 5. Aortic Atherosclerosis (ICD10-I70.0). Port-A-Cath tip in the mid right atrium, stable.    CURRENT THERAPY chemo FOLFIRINOX every 2 weeks started 07/19/17. Given her thrombocytopenia I reduce her dose starting with cycle 5.  INTERVAL HISTORY: Carly [name] is a 77 y.o. female who is here for follow-up. She is here with her family member at the infusion room. She is craving salt and thinks it might be related to her potassium. She still has diarrhea almost everyday with 1-3 BM. She uses imodium and limotid as needed which help.  Pertinent positives and negatives of review of systems are listed and detailed within the above HPI.   REVIEW OF SYSTEMS:   Constitutional: Denies fevers, chills or abnormal weight loss (+) salt craving Eyes: Denies blurriness of vision Ears, nose, mouth, throat, and face: Denies mucositis or sore  throat Respiratory: Denies cough, dyspnea or wheezes Cardiovascular: Denies palpitation, chest discomfort or lower extremity swelling Gastrointestinal:  Denies nausea, heartburn (+) diarrhea Skin: Denies abnormal skin rashes Lymphatics: Denies new lymphadenopathy or easy bruising Neurological:Denies numbness, tingling or new weaknesses Behavioral/Psych: Mood is stable, no new changes  All other systems were reviewed with the patient and are negative.  MEDICAL HISTORY:  Past Medical History:  Diagnosis Date  . Allergic rhinitis    Skin Test 04-14-2009  . Asthma   . Cancer ([place]) 1981   adenocarcinoma of uterus  . 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 Family history of breast cancer   . History of uterine cancer   . Hyperlipemia   . Hypertension   . Insomnia     SURGICAL HISTORY: Past Surgical History:  Procedure Laterality Date  . ABDOMINAL HYSTERECTOMY    . CARPAL TUNNEL RELEASE    . EUS N/A 06/13/2017   Procedure: FULL UPPER ENDOSCOPIC ULTRASOUND (EUS) RADIAL;  Surgeon: Arta Silence, MD;  Location: [facility];  Service: Endoscopy;  Laterality: N/A;  . I&D EXTREMITY  09/17/2011   Procedure: IRRIGATION AND DEBRIDEMENT EXTREMITY;  Surgeon: Roseanne Kaufman, MD;  Location: [place];  Service: Orthopedics;  Laterality: Left;  with drain placement  . IR FLUORO GUIDE PORT INSERTION RIGHT  07/18/2017  . IR US GUIDE VASC ACCESS RIGHT  07/18/2017  . NASAL SEPTOPLASTY W/ TURBINOPLASTY    . pelvic floor rebuild    . TONSILLECTOMY    . TOTAL ABDOMINAL HYSTERECTOMY W/ BILATERAL SALPINGOOPHORECTOMY      I have reviewed the social history and family history with the patient and they  are unchanged from previous note.  ALLERGIES:  is allergic to cephalexin and nabumetone.  MEDICATIONS:  Current Outpatient Medications  Medication Sig Dispense Refill  . ALPRAZolam (XANAX) 0.5 MG tablet Take 1 tablet (0.5 mg total) by mouth at bedtime as needed for  anxiety. 90 tablet 0  . Ascorbic Acid (VITAMIN C) 500 MG PACK Take 100 mg by mouth daily.    Marland Kitchen atorvastatin (LIPITOR) 10 MG tablet Take 10 mg by mouth at bedtime.     Marland Kitchen CALCIUM PO Take 1 tablet by mouth daily.     . Cholecalciferol (VITAMIN D) 2000 units CAPS Take by mouth.    . Cyanocobalamin (VITAMIN B-12 PO) Take 1 tablet by mouth daily.     . diphenoxylate-atropine (LOMOTIL) 2.5-0.025 MG tablet Take 1-2 tablets by mouth 4 (four) times daily as needed for diarrhea or loose stools. 60 tablet 1  . hydrochlorothiazide (HYDRODIURIL) 25 MG tablet Take 1 tablet by mouth daily as needed.     Marland Kitchen HYDROcodone-acetaminophen (NORCO) 5-325 MG tablet Take 1 tablet by mouth every 6 (six) hours as needed for moderate pain. 40 tablet 0  . hyoscyamine (LEVBID) 0.375 MG 12 hr tablet Take 0.375 mg by mouth every 12 (twelve) hours as needed for cramping.     Marland Kitchen KLOR-CON M20 20 MEQ tablet TAKE 1 TABLET BY MOUTH TWICE A DAY 60 tablet 1  . KLOR-CON M20 20 MEQ tablet TAKE 1 TABLET (20 MEQ TOTAL) BY MOUTH ONCE FOR 1 DOSE. 30 tablet 1  . KLOR-CON M20 20 MEQ tablet TAKE 1 TABLET BY MOUTH TWICE A DAY 60 tablet 1  . levothyroxine (SYNTHROID, LEVOTHROID) 25 MCG tablet Take 25 mcg by mouth daily before breakfast.     . lidocaine-prilocaine (EMLA) cream Apply to affected area once 30 g 3  . magic mouthwash w/lidocaine SOLN Take 5 mLs by mouth 3 (three) times daily as needed for mouth pain. 240 mL 0  . metFORMIN (GLUCOPHAGE) 500 MG tablet TAKE 1 TABLET BY MOUTH EVERY DAY WITH BREAKFAST 30 tablet 1  . Omega-3 Fatty Acids (FISH OIL PO) Take 1 capsule by mouth daily.     Marland Kitchen omeprazole (PRILOSEC) 20 MG capsule Take 1 tablet by mouth daily.    . ondansetron (ZOFRAN) 8 MG tablet Take 1 tablet (8 mg total) by mouth 2 (two) times daily as needed for refractory nausea / vomiting. Start on day 3 after chemotherapy. 30 tablet 1  . ONETOUCH DELICA LANCETS 35T MISC USE TO TEST YOUR BLOOD SUGAR ONCE A DAY FASTING AND 2 HRS AFTER A MEAL AS  NEEDED  3  . ONETOUCH VERIO test strip USE TO TEST YOUR BLOOD SUGAR ONCE A DAY FASTING & 2 HRS AFTER A MEAL AS NEEDED  3  . prochlorperazine (COMPAZINE) 10 MG tablet Take 1 tablet (10 mg total) by mouth every 6 (six) hours as needed (NAUSEA). 30 tablet 2  . PROMACTA 50 MG tablet TAKE 1 TABLET (50 MG) BY MOUTH EVERY DAY ON AN EMPTY STOMACH 1 HOUR BEFORE A MEAL OR 2 HOURS AFTER. 30 tablet 0  . ROCKLATAN 0.02-0.005 % SOLN PLACE 1 DROP INTO BOTH EYES AT BEDTIME  12  . traMADol (ULTRAM) 50 MG tablet Take 1-2 tablets every 6 hours as needed for pain 180 tablet 0  . traZODone (DESYREL) 100 MG tablet Take 1 tablet (100 mg total) by mouth at bedtime. (Patient taking differently: Take 50 mg by mouth at bedtime. ) 90 tablet 3   No current facility-administered medications  for this visit.     PHYSICAL EXAMINATION: ECOG PERFORMANCE STATUS: 1 - Symptomatic but completely ambulatory Vitals: BP 144/69 Pulse 88 RR 18 GENERAL:alert, no distress and comfortable SKIN: skin color, texture, turgor are normal, no rashes or significant lesions EYES: normal, Conjunctiva are pink and non-injected, sclera clear OROPHARYNX:no exudate, no erythema and lips, buccal mucosa, and tongue normal  NECK: supple, thyroid normal size, non-tender, without nodularity LYMPH:  no palpable lymphadenopathy in the cervical, axillary or inguinal LUNGS: clear to auscultation and percussion with normal breathing effort HEART: regular rate & rhythm and no murmurs and no lower extremity edema ABDOMEN:abdomen soft, non-tender and normal bowel sounds Musculoskeletal:no cyanosis of digits and no clubbing  NEURO: alert & oriented x 3 with fluent speech, no focal motor/sensory deficits  LABORATORY DATA:  I have reviewed the data as listed CBC Latest Ref Rng & Units 01/21/2018 01/07/2018 12/24/2017  WBC 4.0 - 10.5 K/uL 7.2 8.2 8.8  Hemoglobin 12.0 - 15.0 g/dL 11.1(L) 10.6(L) 11.0(L)  Hematocrit 36.0 - 46.0 % 33.1(L) 31.9(L) 33.1(L)   Platelets 150 - 400 K/uL 102(L) 109(L) 136(L)     CMP Latest Ref Rng & Units 01/21/2018 01/07/2018 12/24/2017  Glucose 70 - 99 mg/dL 113(H) 129(H) 126(H)  BUN 8 - 23 mg/dL 7(L) 9 9  Creatinine 0.44 - 1.00 mg/dL 0.78 0.78 0.79  Sodium 135 - 145 mmol/L 142 139 141  Potassium 3.5 - 5.1 mmol/L 4.0 3.8 3.9  Chloride 98 - 111 mmol/L 106 102 104  CO2 22 - 32 mmol/L '26 28 28  ' Calcium 8.9 - 10.3 mg/dL 9.3 9.1 9.6  Total Protein 6.5 - 8.1 g/dL 6.4(L) 6.2(L) 6.4(L)  Total Bilirubin 0.3 - 1.2 mg/dL 0.6 0.5 0.5  Alkaline Phos 38 - 126 U/L 192(H) 163(H) 169(H)  AST 15 - 41 U/L '20 17 20  ' ALT 0 - 44 U/L '15 12 13   ' Tumor Marker CA 19-9  07/19/17: 1436 08/16/17 606  09/13/17: 170  10/11/17: 95 11/22/17: 36 12/24/17: 23   RADIOGRAPHIC STUDIES: I have personally reviewed the radiological images as listed and agreed with the findings in the report. No results found.   ASSESSMENT & PLAN:  Carly NODAL is a 77 y.o. female with history of hypothyroidism, GERD, Anxiety, osteopenia, Glaucoma, HTN, hypercholesterolemia, and H/o Uterine Cancer, presented with intermittent upper abdominal pain   1. 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 -Patient understands her cancer is unresectable and incurable.  I previously discussed surgical referral to other cancer center, she declined. -She has been tolerating chemotherapy moderately well, due to side effects and cytopenias, her chemo dose has been decreased significantly.  She has been tolerating well overall lately. -She is on Promecta for chemo induced thrombocytopenia -Her last restaging scan on January 04, 2018 showed the pancreatic head mass has slightly decreased in size.  Unfortunately remains to be  unresectable.  No evidence of distant metastasis.  Her tumor marker has dropped significantly, indicating good response to treatment. -Labs reviewed, CBC showed Hg 11.1 PLT 102K. CMP showed BG 113 total protein 6.4 albumin 3.4 AKP 192.  Adequate treatment, will proceed chemo today and continue every 2 weeks. -She will take a chemo break after next cycle for the holiday.   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 Diarrhea, abdominal pain, and weight loss -Diarrhea controlled with Lomotil. -She uses Tramadol for moderate pain and hydrocodone for severe pain -overall manageable, pain improved    4. H/o Uterine Cancer, Stage I, diagnosed in 77 -She had a total hysterectomy and BSO in 1981. She had no following treatments -Followed by her Gynecologist with yearly pap smears.   5. Genetic Testing  -Negative  6. Hypokalemia - She currently takes 69mq daily at home -Labs reviewed, K is 4  7. HTN, Hypothyroidism -f/u with PCP  8. Goal of care discussion -Patient understand her cancer treatment is palliative, to prolong her life.   -She is full code now  Plan  -Lab reviewed, adequate for treatment, will proceed to chemo today  -f/u in 2 weeks    No problem-specific Assessment & Plan notes found for this encounter.   No orders of the defined types were placed in this encounter.  All questions were answered. The patient knows to call the clinic with any problems, questions or concerns. No barriers to learning was detected. I spent 20 minutes counseling the patient face to face. The total time spent in the appointment was 25 minutes and more than 50% was on counseling and review of test results  I, Noor Dweik am acting as scribe for Dr. YTruitt Merle  I have reviewed the above documentation for accuracy and completeness, and I agree with the above.      YTruitt Merle MD 01/21/2018

## 2018-01-21 ENCOUNTER — Inpatient Hospital Stay: Payer: Medicare Other

## 2018-01-21 ENCOUNTER — Inpatient Hospital Stay (HOSPITAL_BASED_OUTPATIENT_CLINIC_OR_DEPARTMENT_OTHER): Payer: Medicare Other | Admitting: Hematology

## 2018-01-21 VITALS — BP 144/69 | HR 88 | Temp 98.2°F | Resp 18

## 2018-01-21 DIAGNOSIS — C250 Malignant neoplasm of head of pancreas: Secondary | ICD-10-CM

## 2018-01-21 DIAGNOSIS — E876 Hypokalemia: Secondary | ICD-10-CM | POA: Diagnosis not present

## 2018-01-21 DIAGNOSIS — Z8542 Personal history of malignant neoplasm of other parts of uterus: Secondary | ICD-10-CM | POA: Diagnosis not present

## 2018-01-21 DIAGNOSIS — M8580 Other specified disorders of bone density and structure, unspecified site: Secondary | ICD-10-CM

## 2018-01-21 DIAGNOSIS — R197 Diarrhea, unspecified: Secondary | ICD-10-CM | POA: Diagnosis not present

## 2018-01-21 DIAGNOSIS — Z5111 Encounter for antineoplastic chemotherapy: Secondary | ICD-10-CM | POA: Diagnosis not present

## 2018-01-21 DIAGNOSIS — E039 Hypothyroidism, unspecified: Secondary | ICD-10-CM

## 2018-01-21 DIAGNOSIS — Z5189 Encounter for other specified aftercare: Secondary | ICD-10-CM | POA: Diagnosis not present

## 2018-01-21 DIAGNOSIS — E119 Type 2 diabetes mellitus without complications: Secondary | ICD-10-CM | POA: Diagnosis not present

## 2018-01-21 DIAGNOSIS — Z7189 Other specified counseling: Secondary | ICD-10-CM

## 2018-01-21 DIAGNOSIS — Z452 Encounter for adjustment and management of vascular access device: Secondary | ICD-10-CM | POA: Diagnosis not present

## 2018-01-21 DIAGNOSIS — Z95828 Presence of other vascular implants and grafts: Secondary | ICD-10-CM

## 2018-01-21 DIAGNOSIS — I10 Essential (primary) hypertension: Secondary | ICD-10-CM

## 2018-01-21 LAB — CBC WITH DIFFERENTIAL (CANCER CENTER ONLY)
Abs Immature Granulocytes: 0.21 10*3/uL — ABNORMAL HIGH (ref 0.00–0.07)
BASOS ABS: 0 10*3/uL (ref 0.0–0.1)
Basophils Relative: 0 %
EOS ABS: 0.1 10*3/uL (ref 0.0–0.5)
Eosinophils Relative: 1 %
HCT: 33.1 % — ABNORMAL LOW (ref 36.0–46.0)
Hemoglobin: 11.1 g/dL — ABNORMAL LOW (ref 12.0–15.0)
IMMATURE GRANULOCYTES: 3 %
Lymphocytes Relative: 13 %
Lymphs Abs: 1 10*3/uL (ref 0.7–4.0)
MCH: 32.4 pg (ref 26.0–34.0)
MCHC: 33.5 g/dL (ref 30.0–36.0)
MCV: 96.5 fL (ref 80.0–100.0)
Monocytes Absolute: 0.9 10*3/uL (ref 0.1–1.0)
Monocytes Relative: 12 %
NEUTROS PCT: 71 %
NRBC: 0 % (ref 0.0–0.2)
Neutro Abs: 5 10*3/uL (ref 1.7–7.7)
PLATELETS: 102 10*3/uL — AB (ref 150–400)
RBC: 3.43 MIL/uL — AB (ref 3.87–5.11)
RDW: 15.9 % — AB (ref 11.5–15.5)
WBC: 7.2 10*3/uL (ref 4.0–10.5)

## 2018-01-21 LAB — CMP (CANCER CENTER ONLY)
ALBUMIN: 3.4 g/dL — AB (ref 3.5–5.0)
ALT: 15 U/L (ref 0–44)
AST: 20 U/L (ref 15–41)
Alkaline Phosphatase: 192 U/L — ABNORMAL HIGH (ref 38–126)
Anion gap: 10 (ref 5–15)
BUN: 7 mg/dL — AB (ref 8–23)
CHLORIDE: 106 mmol/L (ref 98–111)
CO2: 26 mmol/L (ref 22–32)
Calcium: 9.3 mg/dL (ref 8.9–10.3)
Creatinine: 0.78 mg/dL (ref 0.44–1.00)
GFR, Est AFR Am: >60 mL/min (ref >60)
Glucose, Bld: 113 mg/dL — ABNORMAL HIGH (ref 70–99)
POTASSIUM: 4 mmol/L (ref 3.5–5.1)
SODIUM: 142 mmol/L (ref 135–145)
Total Bilirubin: 0.6 mg/dL (ref 0.3–1.2)
Total Protein: 6.4 g/dL — ABNORMAL LOW (ref 6.5–8.1)

## 2018-01-21 MED ORDER — LEUCOVORIN CALCIUM INJECTION 350 MG
400.0000 mg/m2 | Freq: Once | INTRAVENOUS | Status: AC
  Administered 2018-01-21: 680 mg via INTRAVENOUS
  Filled 2018-01-21: qty 34

## 2018-01-21 MED ORDER — OXALIPLATIN CHEMO INJECTION 100 MG/20ML
50.0000 mg/m2 | Freq: Once | INTRAVENOUS | Status: AC
  Administered 2018-01-21: 85 mg via INTRAVENOUS
  Filled 2018-01-21: qty 17

## 2018-01-21 MED ORDER — SODIUM CHLORIDE 0.9% FLUSH
10.0000 mL | INTRAVENOUS | Status: DC | PRN
  Administered 2018-01-21: 10 mL
  Filled 2018-01-21: qty 10

## 2018-01-21 MED ORDER — DEXTROSE 5 % IV SOLN
Freq: Once | INTRAVENOUS | Status: AC
  Administered 2018-01-21: 09:00:00 via INTRAVENOUS
  Filled 2018-01-21: qty 250

## 2018-01-21 MED ORDER — IRINOTECAN HCL CHEMO INJECTION 100 MG/5ML
100.0000 mg/m2 | Freq: Once | INTRAVENOUS | Status: AC
  Administered 2018-01-21: 180 mg via INTRAVENOUS
  Filled 2018-01-21: qty 9

## 2018-01-21 MED ORDER — ATROPINE SULFATE 1 MG/ML IJ SOLN
INTRAMUSCULAR | Status: AC
  Filled 2018-01-21: qty 1

## 2018-01-21 MED ORDER — DEXAMETHASONE SODIUM PHOSPHATE 10 MG/ML IJ SOLN
INTRAMUSCULAR | Status: AC
  Filled 2018-01-21: qty 1

## 2018-01-21 MED ORDER — DEXAMETHASONE SODIUM PHOSPHATE 10 MG/ML IJ SOLN
10.0000 mg | Freq: Once | INTRAMUSCULAR | Status: AC
  Administered 2018-01-21: 10 mg via INTRAVENOUS

## 2018-01-21 MED ORDER — PALONOSETRON HCL INJECTION 0.25 MG/5ML
INTRAVENOUS | Status: AC
  Filled 2018-01-21: qty 5

## 2018-01-21 MED ORDER — ATROPINE SULFATE 1 MG/ML IJ SOLN
0.5000 mg | Freq: Once | INTRAMUSCULAR | Status: AC | PRN
  Administered 2018-01-21: 0.5 mg via INTRAVENOUS

## 2018-01-21 MED ORDER — PALONOSETRON HCL INJECTION 0.25 MG/5ML
0.2500 mg | Freq: Once | INTRAVENOUS | Status: AC
  Administered 2018-01-21: 0.25 mg via INTRAVENOUS

## 2018-01-21 MED ORDER — SODIUM CHLORIDE 0.9 % IV SOLN
2000.0000 mg/m2 | INTRAVENOUS | Status: DC
  Administered 2018-01-21: 3400 mg via INTRAVENOUS
  Filled 2018-01-21: qty 68

## 2018-01-21 NOTE — Patient Instructions (Signed)
[facility] Discharge Instructions for Patients Receiving Chemotherapy  Today you received the following chemotherapy agents :  Oxaliplatin,  Irinotecan, Leucovorin, Fluorouracil.  To help prevent nausea and vomiting after your treatment, we encourage you to take your nausea medication as prescribed.   If you develop nausea and vomiting that is not controlled by your nausea medication, call the clinic.   BELOW ARE SYMPTOMS THAT SHOULD BE REPORTED IMMEDIATELY:  *FEVER GREATER THAN 100.5 F  *CHILLS WITH OR WITHOUT FEVER  NAUSEA AND VOMITING THAT IS NOT CONTROLLED WITH YOUR NAUSEA MEDICATION  *UNUSUAL SHORTNESS OF BREATH  *UNUSUAL BRUISING OR BLEEDING  TENDERNESS IN MOUTH AND THROAT WITH OR WITHOUT PRESENCE OF ULCERS  *URINARY PROBLEMS  *BOWEL PROBLEMS  UNUSUAL RASH Items with * indicate a potential emergency and should be followed up as soon as possible.  Feel free to call the clinic should you have any questions or concerns. The clinic phone number is [phone].  Please show the CHEMO ALERT CARD at check-in to the Emergency Department and triage nurse.   

## 2018-01-21 NOTE — Patient Instructions (Signed)
Implanted Port Home Guide An implanted port is a type of central line that is placed under the skin. Central lines are used to provide IV access when treatment or nutrition needs to be given through a person's veins. Implanted ports are used for long-term IV access. An implanted port may be placed because:  You need IV medicine that would be irritating to the small veins in your hands or arms.  You need long-term IV medicines, such as antibiotics.  You need IV nutrition for a long period.  You need frequent blood draws for lab tests.  You need dialysis.  Implanted ports are usually placed in the chest area, but they can also be placed in the upper arm, the abdomen, or the leg. An implanted port has two main parts:  Reservoir. The reservoir is round and will appear as a small, raised area under your skin. The reservoir is the part where a needle is inserted to give medicines or draw blood.  Catheter. The catheter is a thin, flexible tube that extends from the reservoir. The catheter is placed into a large vein. Medicine that is inserted into the reservoir goes into the catheter and then into the vein.  How will I care for my incision site? Do not get the incision site wet. Bathe or shower as directed by your health care provider. How is my port accessed? Special steps must be taken to access the port:  Before the port is accessed, a numbing cream can be placed on the skin. This helps numb the skin over the port site.  Your health care provider uses a sterile technique to access the port. ? Your health care provider must put on a mask and sterile gloves. ? The skin over your port is cleaned carefully with an antiseptic and allowed to dry. ? The port is gently pinched between sterile gloves, and a needle is inserted into the port.  Only "non-coring" port needles should be used to access the port. Once the port is accessed, a blood return should be checked. This helps ensure that the port  is in the vein and is not clogged.  If your port needs to remain accessed for a constant infusion, a clear (transparent) bandage will be placed over the needle site. The bandage and needle will need to be changed every week, or as directed by your health care provider.  Keep the bandage covering the needle clean and dry. Do not get it wet. Follow your health care provider's instructions on how to take a shower or bath while the port is accessed.  If your port does not need to stay accessed, no bandage is needed over the port.  What is flushing? Flushing helps keep the port from getting clogged. Follow your health care provider's instructions on how and when to flush the port. Ports are usually flushed with saline solution or a medicine called heparin. The need for flushing will depend on how the port is used.  If the port is used for intermittent medicines or blood draws, the port will need to be flushed: ? After medicines have been given. ? After blood has been drawn. ? As part of routine maintenance.  If a constant infusion is running, the port may not need to be flushed.  How long will my port stay implanted? The port can stay in for as long as your health care provider thinks it is needed. When it is time for the port to come out, surgery will be   done to remove it. The procedure is similar to the one performed when the port was put in. When should I seek immediate medical care? When you have an implanted port, you should seek immediate medical care if:  You notice a bad smell coming from the incision site.  You have swelling, redness, or drainage at the incision site.  You have more swelling or pain at the port site or the surrounding area.  You have a fever that is not controlled with medicine.  This information is not intended to replace advice given to you by your health care provider. Make sure you discuss any questions you have with your health care provider. Document  Released: 02/13/2005 Document Revised: 07/22/2015 Document Reviewed: 10/21/2012 Elsevier Interactive Patient Education  2017 [employer]  

## 2018-01-22 ENCOUNTER — Telehealth: Payer: Self-pay

## 2018-01-22 ENCOUNTER — Encounter: Payer: Self-pay | Admitting: Hematology

## 2018-01-22 LAB — CANCER ANTIGEN 19-9: CA 19-9: 22 U/mL (ref 0–35)

## 2018-01-22 NOTE — Telephone Encounter (Signed)
Per 11/25 no los 

## 2018-01-23 ENCOUNTER — Inpatient Hospital Stay: Payer: Medicare Other

## 2018-01-23 VITALS — BP 155/75 | HR 77 | Temp 98.1°F | Resp 18

## 2018-01-23 DIAGNOSIS — C250 Malignant neoplasm of head of pancreas: Secondary | ICD-10-CM

## 2018-01-23 DIAGNOSIS — Z452 Encounter for adjustment and management of vascular access device: Secondary | ICD-10-CM | POA: Diagnosis not present

## 2018-01-23 DIAGNOSIS — Z5189 Encounter for other specified aftercare: Secondary | ICD-10-CM | POA: Diagnosis not present

## 2018-01-23 DIAGNOSIS — Z5111 Encounter for antineoplastic chemotherapy: Secondary | ICD-10-CM | POA: Diagnosis not present

## 2018-01-23 DIAGNOSIS — Z7189 Other specified counseling: Secondary | ICD-10-CM

## 2018-01-23 DIAGNOSIS — Z8542 Personal history of malignant neoplasm of other parts of uterus: Secondary | ICD-10-CM | POA: Diagnosis not present

## 2018-01-23 DIAGNOSIS — E876 Hypokalemia: Secondary | ICD-10-CM | POA: Diagnosis not present

## 2018-01-23 MED ORDER — PEGFILGRASTIM INJECTION 6 MG/0.6ML ~~LOC~~
6.0000 mg | PREFILLED_SYRINGE | Freq: Once | SUBCUTANEOUS | Status: AC
  Administered 2018-01-23: 6 mg via SUBCUTANEOUS

## 2018-01-23 MED ORDER — SODIUM CHLORIDE 0.9% FLUSH
10.0000 mL | INTRAVENOUS | Status: DC | PRN
  Administered 2018-01-23: 10 mL
  Filled 2018-01-23: qty 10

## 2018-01-23 MED ORDER — PEGFILGRASTIM INJECTION 6 MG/0.6ML ~~LOC~~
PREFILLED_SYRINGE | SUBCUTANEOUS | Status: AC
  Filled 2018-01-23: qty 0.6

## 2018-01-23 MED ORDER — HEPARIN SOD (PORK) LOCK FLUSH 100 UNIT/ML IV SOLN
500.0000 [IU] | Freq: Once | INTRAVENOUS | Status: AC | PRN
  Administered 2018-01-23: 500 [IU]
  Filled 2018-01-23: qty 5

## 2018-01-25 ENCOUNTER — Other Ambulatory Visit: Payer: Self-pay | Admitting: Hematology

## 2018-02-01 NOTE — Progress Notes (Signed)
[place]   [phone] [phone]) [phone]   Clinic Follow up Note   Patient Care Team: Darcus Austin, MD as PCP - General (Family Medicine) Arta Silence, MD as Consulting Physician (Gastroenterology) Truitt Merle, MD as Consulting Physician (Hematology) 02/04/2018  CHIEF COMPLAINT: F/u on pancreatic cancer   SUMMARY OF ONCOLOGIC HISTORY: Oncology History   Cancer Staging Pancreatic cancer [facility]) Staging form: Exocrine Pancreas, AJCC 8th Edition - Clinical stage from 06/13/2017: Stage III (cT4, cN0, cM0) - Signed by Truitt Merle, MD on 06/13/2017       Pancreatic cancer ([place])   06/08/2017 Imaging    06/08/2017 CT Abdomen IMPRESSION: Irregular solid hypoechoic mass within the pancreatic head, 2.6 x 2.2 cm with associated pancreatic ductal dilatation and pancreatic atrophy, most compatible with pancreatic cancer. 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  Diffuse colonic diverticulosis. Slight stranding around the distal descending colon may reflect early active diverticulitis.  Bilateral renal parapelvic cysts.  Aortic atherosclerosis.    06/13/2017 Initial Diagnosis    Pancreatic cancer ([place])    06/13/2017 Cancer Staging    Staging form: Exocrine Pancreas, AJCC 8th Edition - Clinical stage from 06/13/2017: Stage III (cT4, cN0, cM0) - Signed by Truitt Merle, MD on 06/13/2017    06/13/2017 Procedure    EUS by Dr. Paulita Fujita 06/13/17  IMPRESSION:  -There was no sign of significant pathology in the ampulla. - There was no evidence of significant pathology in the left lobe of the liver. - A mass was identified in the pancreatic head. Abutment SMV; invasion portal confluence; no adenopathy. Fine needle aspiration performed. If this is a pancreatic adenocarcinoma, it would be staged T4/N0/Mx by EUS.    06/13/2017 Initial Biopsy    Diagnosis 06/13/17 FINE NEEDLE  ASPIRATION, ENDOSCOPIC, PANCREAS HEAD (SPECIMEN 1 OF 1 COLLECTED 06/13/17): ADENOCARCINOMA. Preliminary Diagnosis Intraoperative Diagnosis: 1-3) ATYPICAL CELLS, ADDITIONAL MATERIAL REQUESTED. (NDK) 4-6 ADENOCARCINOMA. (NDK) Reported to Dr. Paulita Fujita on 06/13/17 @ 11:07am.     06/21/2017 Imaging    CT Chest without contrast IMPRESSION: 1. No findings suspicious for metastatic disease within the chest.  2. Tiny subpleural right lower lobe pulmonary nodule is nonspecific, but likely benign. This can be addressed on follow-up imaging.  3. Aortic Atherosclerosis (ICD10-I70.0).    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  The Hereditary Gene Panel offered by [facility] includes sequencing and/or deletion duplication testing of the following 47 genes: APC, ATM, AXIN2, BARD1, BMPR1A, BRCA1, BRCA2, BRIP1, CDH1, CDK4, CDKN2A (p14ARF), CDKN2A (p16INK4a), CHEK2, CTNNA1, DICER1, EPCAM (Deletion/duplication testing only), GREM1 (promoter region deletion/duplication testing only), KIT, MEN1, MLH1, MSH2, MSH3, MSH6, MUTYH, NBN, NF1, NHTL1, PALB2, PDGFRA, PMS2, POLD1, POLE, PTEN, RAD50, RAD51C, RAD51D, SDHB, SDHC, SDHD, SMAD4, SMARCA4. STK11, TP53, TSC1, TSC2, and VHL.  The following genes were evaluated for sequence changes only: SDHA and HOXB13 c.251G>A variant only. The report date is August 14, 2017.    09/24/2017 Progression    09/24/2017 CT CAP Pancreas: The mass involving the head of pancreas measures 2.5 x 3.8 by 3.1 cm. On the previous exam this measured 2.2 x 2.6 by 3.4 cm.    09/24/2017 Imaging    09/24/2017 CT CAP IMPRESSION: 1. There is been interval local progression of disease with increased size of head of  pancreas neoplasm. There is progressive vascular involvement with partial encasement of the celiac trunk and SMA. Continued encasement and marked narrowing of  the portal venous confluence. 2. No evidence for metastatic adenopathy within the abdomen, liver metastasis or pulmonary metastasis. 3. Aortic atherosclerosis and LAD coronary artery atherosclerotic calcifications. Aortic Atherosclerosis (ICD10-I70.0).    01/04/2018 Imaging    01/04/2018 CT CAP IMPRESSION: 1. No local disease progression identified. The pancreatic head mass is ill-defined and appears slightly smaller. There is chronic partial encasement of the celiac trunk and SMA and chronic short segment occlusion at the SMV/portal vein confluence. 2. No definite evidence of metastatic disease. Stable small hypervascular lesion inferiorly in the right hepatic lobe, likely an incidental vascular lesion. There is slightly greater mesenteric nodularity posterior to the transverse colon, but no other definite signs of peritoneal carcinomatosis. Attention on follow-up recommended. 3. Stable scattered tiny subpleural pulmonary nodules bilaterally, likely benign based on stability, although attention on follow-up recommended. 4. Mild splenomegaly. 5. Aortic Atherosclerosis (ICD10-I70.0). Port-A-Cath tip in the mid right atrium, stable.     CURRENT THERAPY  chemo FOLFIRINOX every 2 weeks with dose reduction due to thrombocytopenia. Promecta for thrombocytopenia.    INTERVAL HISTORY: Carly [name] is a 77 y.o. female who is here for follow-up. Today, she is here with her family member. She is doing well and tolerated last chemo cycle well. She is on diarrhea medications as needed. Her energy level is good. She noticed abdominal cramps after vacuuming the house recently.    Pertinent positives and negatives of review of systems are listed and detailed within the above HPI.  REVIEW OF SYSTEMS:   Constitutional: Denies fevers, chills or abnormal weight loss Eyes: Denies blurriness of vision Ears, nose, mouth, throat, and face: Denies mucositis or sore throat Respiratory: Denies  cough, dyspnea or wheezes Cardiovascular: Denies palpitation, chest discomfort or lower extremity swelling Gastrointestinal:  Denies nausea, heartburn (+) diarrhea (+) abdominal cramps Skin: Denies abnormal skin rashes Lymphatics: Denies new lymphadenopathy or easy bruising Neurological:Denies numbness, tingling or new weaknesses Behavioral/Psych: Mood is stable, no new changes  All other systems were reviewed with the patient and are negative.  MEDICAL HISTORY:  Past Medical History:  Diagnosis Date  . Allergic rhinitis    Skin Test 04-14-2009  . Asthma   . Cancer ([place]) 1981   adenocarcinoma of uterus  . Diabetes mellitus without complication ([place])    type II, no meds per pt  . Family history of breast cancer   . History of uterine cancer   . Hyperlipemia   . Hypertension   . Insomnia     SURGICAL HISTORY: Past Surgical History:  Procedure Laterality Date  . ABDOMINAL HYSTERECTOMY    . CARPAL TUNNEL RELEASE    . EUS N/A 06/13/2017   Procedure: FULL UPPER ENDOSCOPIC ULTRASOUND (EUS) RADIAL;  Surgeon: Arta Silence, MD;  Location: [facility];  Service: Endoscopy;  Laterality: N/A;  . I&D EXTREMITY  09/17/2011   Procedure: IRRIGATION AND DEBRIDEMENT EXTREMITY;  Surgeon: Roseanne Kaufman, MD;  Location: [place];  Service: Orthopedics;  Laterality: Left;  with drain placement  . IR FLUORO GUIDE PORT INSERTION RIGHT  07/18/2017  . IR US GUIDE VASC ACCESS RIGHT  07/18/2017  . NASAL SEPTOPLASTY W/ TURBINOPLASTY    . pelvic floor rebuild    . TONSILLECTOMY    . TOTAL ABDOMINAL HYSTERECTOMY W/ BILATERAL SALPINGOOPHORECTOMY      I have reviewed the social history and family history with the patient and they  are unchanged from previous note.  ALLERGIES:  is allergic to cephalexin and nabumetone.  MEDICATIONS:  Current Outpatient Medications  Medication Sig Dispense Refill  . ALPRAZolam (XANAX) 0.5 MG tablet Take 1 tablet (0.5 mg total) by mouth at bedtime as needed for anxiety. 90  tablet 0  . Ascorbic Acid (VITAMIN C) 500 MG PACK Take 100 mg by mouth daily.    Marland Kitchen atorvastatin (LIPITOR) 10 MG tablet Take 10 mg by mouth at bedtime.     Marland Kitchen CALCIUM PO Take 1 tablet by mouth daily.     . Cholecalciferol (VITAMIN D) 2000 units CAPS Take by mouth.    . Cyanocobalamin (VITAMIN B-12 PO) Take 1 tablet by mouth daily.     . diphenoxylate-atropine (LOMOTIL) 2.5-0.025 MG tablet Take 1-2 tablets by mouth 4 (four) times daily as needed for diarrhea or loose stools. 60 tablet 1  . hydrochlorothiazide (HYDRODIURIL) 25 MG tablet Take 1 tablet by mouth daily as needed.     Marland Kitchen HYDROcodone-acetaminophen (NORCO) 5-325 MG tablet Take 1 tablet by mouth every 6 (six) hours as needed for moderate pain. 40 tablet 0  . hyoscyamine (LEVBID) 0.375 MG 12 hr tablet Take 0.375 mg by mouth every 12 (twelve) hours as needed for cramping.     Marland Kitchen KLOR-CON M20 20 MEQ tablet TAKE 1 TABLET BY MOUTH TWICE A DAY 60 tablet 1  . KLOR-CON M20 20 MEQ tablet TAKE 1 TABLET BY MOUTH TWICE A DAY 60 tablet 1  . KLOR-CON M20 20 MEQ tablet TAKE 1 TABLET (20 MEQ TOTAL) BY MOUTH ONCE FOR 1 DOSE. 30 tablet 1  . levothyroxine (SYNTHROID, LEVOTHROID) 25 MCG tablet Take 25 mcg by mouth daily before breakfast.     . lidocaine-prilocaine (EMLA) cream Apply to affected area once 30 g 3  . magic mouthwash w/lidocaine SOLN Take 5 mLs by mouth 3 (three) times daily as needed for mouth pain. 240 mL 0  . metFORMIN (GLUCOPHAGE) 500 MG tablet TAKE 1 TABLET BY MOUTH EVERY DAY WITH BREAKFAST 30 tablet 1  . Omega-3 Fatty Acids (FISH OIL PO) Take 1 capsule by mouth daily.     Marland Kitchen omeprazole (PRILOSEC) 20 MG capsule Take 1 tablet by mouth daily.    . ondansetron (ZOFRAN) 8 MG tablet Take 1 tablet (8 mg total) by mouth 2 (two) times daily as needed for refractory nausea / vomiting. Start on day 3 after chemotherapy. 30 tablet 1  . ONETOUCH DELICA LANCETS 32I MISC USE TO TEST YOUR BLOOD SUGAR ONCE A DAY FASTING AND 2 HRS AFTER A MEAL AS NEEDED  3  .  ONETOUCH VERIO test strip USE TO TEST YOUR BLOOD SUGAR ONCE A DAY FASTING & 2 HRS AFTER A MEAL AS NEEDED  3  . prochlorperazine (COMPAZINE) 10 MG tablet Take 1 tablet (10 mg total) by mouth every 6 (six) hours as needed (NAUSEA). 30 tablet 2  . PROMACTA 50 MG tablet TAKE 1 TABLET (50 MG) BY MOUTH EVERY DAY ON AN EMPTY STOMACH 1 HOUR BEFORE A MEAL OR 2 HOURS AFTER. 30 tablet 0  . ROCKLATAN 0.02-0.005 % SOLN PLACE 1 DROP INTO BOTH EYES AT BEDTIME  12  . traMADol (ULTRAM) 50 MG tablet Take 1-2 tablets every 6 hours as needed for pain 180 tablet 0  . traZODone (DESYREL) 100 MG tablet Take 1 tablet (100 mg total) by mouth at bedtime. (Patient taking differently: Take 50 mg by mouth at bedtime. ) 90 tablet 3   No current facility-administered medications  for this visit.    Facility-Administered Medications Ordered in Other Visits  Medication Dose Route Frequency Provider Last Rate Last Dose  . dextrose 5 % solution   Intravenous Continuous Truitt Merle, MD 20 mL/hr at 02/04/18 1030    . fluorouracil (ADRUCIL) 3,400 mg in sodium chloride 0.9 % 82 mL chemo infusion  2,000 mg/m2 (Treatment Plan Recorded) Intravenous 1 day or 1 dose Truitt Merle, MD      . irinotecan (CAMPTOSAR) 180 mg in sodium chloride 0.9 % 500 mL chemo infusion  100 mg/m2 (Treatment Plan Recorded) Intravenous Once Truitt Merle, MD 339 mL/hr at 02/04/18 1404 180 mg at 02/04/18 1404  . leucovorin 680 mg in sodium chloride 0.9 % 250 mL infusion  400 mg/m2 (Treatment Plan Recorded) Intravenous Once Truitt Merle, MD 189 mL/hr at 02/04/18 1402 680 mg at 02/04/18 1402    PHYSICAL EXAMINATION: ECOG PERFORMANCE STATUS: 2 - Symptomatic, <50% confined to bed  Vitals:   02/04/18 0912  BP: 134/80  Pulse: 82  Resp: 18  Temp: 98 F (36.7 C)  SpO2: 100%   Filed Weights   02/04/18 0912  Weight: 139 lb 3.2 oz (63.1 kg)    GENERAL:alert, no distress and comfortable SKIN: skin color, texture, turgor are normal, no rashes or significant lesions EYES:  normal, Conjunctiva are pink and non-injected, sclera clear OROPHARYNX:no exudate, no erythema and lips, buccal mucosa, and tongue normal  NECK: supple, thyroid normal size, non-tender, without nodularity LYMPH:  no palpable lymphadenopathy in the cervical, axillary or inguinal LUNGS: clear to auscultation and percussion with normal breathing effort HEART: regular rate & rhythm and no murmurs and no lower extremity edema ABDOMEN:abdomen soft and normal bowel sounds (+) periumbilical abdominal pain  Musculoskeletal:no cyanosis of digits and no clubbing  NEURO: alert & oriented x 3 with fluent speech, no focal motor/sensory deficits  LABORATORY DATA:  I have reviewed the data as listed CBC Latest Ref Rng & Units 02/04/2018 01/21/2018 01/07/2018  WBC 4.0 - 10.5 K/uL 6.0 7.2 8.2  Hemoglobin 12.0 - 15.0 g/dL 9.9(L) 11.1(L) 10.6(L)  Hematocrit 36.0 - 46.0 % 29.9(L) 33.1(L) 31.9(L)  Platelets 150 - 400 K/uL 90(L) 102(L) 109(L)     CMP Latest Ref Rng & Units 02/04/2018 01/21/2018 01/07/2018  Glucose 70 - 99 mg/dL 167(H) 113(H) 129(H)  BUN 8 - 23 mg/dL 8 7(L) 9  Creatinine 0.44 - 1.00 mg/dL 0.75 0.78 0.78  Sodium 135 - 145 mmol/L 142 142 139  Potassium 3.5 - 5.1 mmol/L 4.0 4.0 3.8  Chloride 98 - 111 mmol/L 106 106 102  CO2 22 - 32 mmol/L '25 26 28  ' Calcium 8.9 - 10.3 mg/dL 9.0 9.3 9.1  Total Protein 6.5 - 8.1 g/dL 6.1(L) 6.4(L) 6.2(L)  Total Bilirubin 0.3 - 1.2 mg/dL 0.5 0.6 0.5  Alkaline Phos 38 - 126 U/L 201(H) 192(H) 163(H)  AST 15 - 41 U/L '21 20 17  ' ALT 0 - 44 U/L '15 15 12      ' RADIOGRAPHIC STUDIES: I have personally reviewed the radiological images as listed and agreed with the findings in the report. No results found.   ASSESSMENT & PLAN:  Carly [name] is a 77 y.o. female with history of  1. 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 -she has been on first line  Chemo FOLFIRINOX with  dose reduction due to cytopenia, and Promecta for chemo related thrombocytopenia. Tolerating well. -Restaging CT scan has showed stable pancreatic mass, no metastasis.  Her tumor marker CA 19.9 has came down to normal, indicating good response to chemotherapy. -Labs reviewed, CBC showed Hg 9.9 PLT 90K WBC 6K.  She is clinically doing well, main side effect from chemo is fatigue, she is able to function at home.  We will continue chemotherapy, will give her chemo break as needed -Patient has asked if she can switch to oral chemo agent, which is certainly a possibility. Will discuss with her again after next restaging scan  -She will have a 4-week chemo break after this cycle.  2. 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 Improving. -CMP pending  3. Diarrhea, abdominal pain, and weight loss -Currently on Lomotil for diarrhea, Tramadol for moderate pain, and hydrocodone for severe pain. -Managed well and overall improving  4. H/o Endometrial Cancer, Stage I, diagnosed in 1981 -Treated with hysterectomy and BSO in 1981. -f/u with OB/GYN and continue annual Pap smears. -Genetics was negative   5. Hypokalemia - Currently on Potassium 73mq daily -CMP pending  6. HTN, Hypothyroidism -f/u with PCP  7.  Chemo induced thrombocytopenia -Improved with Promacta and chemo dose reduction -Platelets 90 today, she will continue Promacta for 1 week, then stop due to the chemo break.  8. Goal of care discussion -Patient understand her cancer treatment is palliative, to prolong her life.   -She is full code now   Plan  -Labs reviewed, good to proceed with treatment today -she will have a chemo break after today's treatment -f/u in 4 weeks with chemo -I refilled lomotil   No problem-specific Assessment & Plan notes found for this encounter.   No orders of the  defined types were placed in this encounter.  All questions were answered. The patient knows to call the clinic with any problems, questions or concerns. No barriers to learning was detected. I spent 20 minutes counseling the patient face to face. The total time spent in the appointment was 25 minutes and more than 50% was on counseling and review of test results  I, Noor Dweik am acting as scribe for Dr. YTruitt Merle  I have reviewed the above documentation for accuracy and completeness, and I agree with the above.     YTruitt Merle MD 02/04/2018

## 2018-02-04 ENCOUNTER — Inpatient Hospital Stay: Payer: Medicare Other

## 2018-02-04 ENCOUNTER — Encounter: Payer: Self-pay | Admitting: Hematology

## 2018-02-04 ENCOUNTER — Inpatient Hospital Stay (HOSPITAL_BASED_OUTPATIENT_CLINIC_OR_DEPARTMENT_OTHER): Payer: Medicare Other | Admitting: Hematology

## 2018-02-04 ENCOUNTER — Telehealth: Payer: Self-pay

## 2018-02-04 ENCOUNTER — Telehealth: Payer: Self-pay | Admitting: Hematology

## 2018-02-04 ENCOUNTER — Inpatient Hospital Stay: Payer: Medicare Other | Attending: Hematology

## 2018-02-04 VITALS — BP 134/80 | HR 82 | Temp 98.0°F | Resp 18 | Ht 62.0 in | Wt 139.2 lb

## 2018-02-04 DIAGNOSIS — E876 Hypokalemia: Secondary | ICD-10-CM | POA: Diagnosis not present

## 2018-02-04 DIAGNOSIS — Z5189 Encounter for other specified aftercare: Secondary | ICD-10-CM | POA: Diagnosis not present

## 2018-02-04 DIAGNOSIS — I10 Essential (primary) hypertension: Secondary | ICD-10-CM | POA: Diagnosis not present

## 2018-02-04 DIAGNOSIS — C250 Malignant neoplasm of head of pancreas: Secondary | ICD-10-CM

## 2018-02-04 DIAGNOSIS — E039 Hypothyroidism, unspecified: Secondary | ICD-10-CM | POA: Insufficient documentation

## 2018-02-04 DIAGNOSIS — Z8542 Personal history of malignant neoplasm of other parts of uterus: Secondary | ICD-10-CM | POA: Diagnosis not present

## 2018-02-04 DIAGNOSIS — Z95828 Presence of other vascular implants and grafts: Secondary | ICD-10-CM

## 2018-02-04 DIAGNOSIS — D6959 Other secondary thrombocytopenia: Secondary | ICD-10-CM

## 2018-02-04 DIAGNOSIS — K5730 Diverticulosis of large intestine without perforation or abscess without bleeding: Secondary | ICD-10-CM | POA: Diagnosis not present

## 2018-02-04 DIAGNOSIS — Z452 Encounter for adjustment and management of vascular access device: Secondary | ICD-10-CM | POA: Insufficient documentation

## 2018-02-04 DIAGNOSIS — Z5111 Encounter for antineoplastic chemotherapy: Secondary | ICD-10-CM | POA: Insufficient documentation

## 2018-02-04 DIAGNOSIS — M8580 Other specified disorders of bone density and structure, unspecified site: Secondary | ICD-10-CM

## 2018-02-04 DIAGNOSIS — Z7189 Other specified counseling: Secondary | ICD-10-CM

## 2018-02-04 DIAGNOSIS — E119 Type 2 diabetes mellitus without complications: Secondary | ICD-10-CM | POA: Diagnosis not present

## 2018-02-04 LAB — CMP (CANCER CENTER ONLY)
ALBUMIN: 3.2 g/dL — AB (ref 3.5–5.0)
ALT: 15 U/L (ref 0–44)
AST: 21 U/L (ref 15–41)
Alkaline Phosphatase: 201 U/L — ABNORMAL HIGH (ref 38–126)
Anion gap: 11 (ref 5–15)
BUN: 8 mg/dL (ref 8–23)
CHLORIDE: 106 mmol/L (ref 98–111)
CO2: 25 mmol/L (ref 22–32)
Calcium: 9 mg/dL (ref 8.9–10.3)
Creatinine: 0.75 mg/dL (ref 0.44–1.00)
GFR, Est AFR Am: >60 mL/min (ref >60)
GLUCOSE: 167 mg/dL — AB (ref 70–99)
POTASSIUM: 4 mmol/L (ref 3.5–5.1)
Sodium: 142 mmol/L (ref 135–145)
Total Bilirubin: 0.5 mg/dL (ref 0.3–1.2)
Total Protein: 6.1 g/dL — ABNORMAL LOW (ref 6.5–8.1)

## 2018-02-04 LAB — CBC WITH DIFFERENTIAL (CANCER CENTER ONLY)
ABS IMMATURE GRANULOCYTES: 0.15 10*3/uL — AB (ref 0.00–0.07)
BASOS ABS: 0 10*3/uL (ref 0.0–0.1)
BASOS PCT: 0 %
EOS ABS: 0.1 10*3/uL (ref 0.0–0.5)
Eosinophils Relative: 1 %
HCT: 29.9 % — ABNORMAL LOW (ref 36.0–46.0)
Hemoglobin: 9.9 g/dL — ABNORMAL LOW (ref 12.0–15.0)
IMMATURE GRANULOCYTES: 3 %
Lymphocytes Relative: 12 %
Lymphs Abs: 0.7 10*3/uL (ref 0.7–4.0)
MCH: 32.8 pg (ref 26.0–34.0)
MCHC: 33.1 g/dL (ref 30.0–36.0)
MCV: 99 fL (ref 80.0–100.0)
Monocytes Absolute: 0.7 10*3/uL (ref 0.1–1.0)
Monocytes Relative: 12 %
NEUTROS ABS: 4.4 10*3/uL (ref 1.7–7.7)
Neutrophils Relative %: 72 %
PLATELETS: 90 10*3/uL — AB (ref 150–400)
RBC: 3.02 MIL/uL — ABNORMAL LOW (ref 3.87–5.11)
RDW: 17.2 % — AB (ref 11.5–15.5)
WBC: 6 10*3/uL (ref 4.0–10.5)
nRBC: 0 % (ref 0.0–0.2)

## 2018-02-04 MED ORDER — SODIUM CHLORIDE 0.9% FLUSH
10.0000 mL | INTRAVENOUS | Status: DC | PRN
  Administered 2018-02-04: 10 mL
  Filled 2018-02-04: qty 10

## 2018-02-04 MED ORDER — DEXAMETHASONE SODIUM PHOSPHATE 10 MG/ML IJ SOLN
10.0000 mg | Freq: Once | INTRAMUSCULAR | Status: AC
  Administered 2018-02-04: 10 mg via INTRAVENOUS

## 2018-02-04 MED ORDER — DEXTROSE 5 % IV SOLN
INTRAVENOUS | Status: DC
  Administered 2018-02-04: 11:00:00 via INTRAVENOUS
  Filled 2018-02-04: qty 250

## 2018-02-04 MED ORDER — DEXTROSE 5 % IV SOLN
Freq: Once | INTRAVENOUS | Status: AC
  Administered 2018-02-04: 11:00:00 via INTRAVENOUS
  Filled 2018-02-04: qty 250

## 2018-02-04 MED ORDER — PALONOSETRON HCL INJECTION 0.25 MG/5ML
INTRAVENOUS | Status: AC
  Filled 2018-02-04: qty 5

## 2018-02-04 MED ORDER — OXALIPLATIN CHEMO INJECTION 100 MG/20ML
50.0000 mg/m2 | Freq: Once | INTRAVENOUS | Status: AC
  Administered 2018-02-04: 85 mg via INTRAVENOUS
  Filled 2018-02-04: qty 17

## 2018-02-04 MED ORDER — SODIUM CHLORIDE 0.9 % IV SOLN
2000.0000 mg/m2 | INTRAVENOUS | Status: DC
  Administered 2018-02-04: 3400 mg via INTRAVENOUS
  Filled 2018-02-04: qty 68

## 2018-02-04 MED ORDER — DEXAMETHASONE SODIUM PHOSPHATE 10 MG/ML IJ SOLN
INTRAMUSCULAR | Status: AC
  Filled 2018-02-04: qty 1

## 2018-02-04 MED ORDER — SODIUM CHLORIDE 0.9 % IV SOLN
400.0000 mg/m2 | Freq: Once | INTRAVENOUS | Status: AC
  Administered 2018-02-04: 680 mg via INTRAVENOUS
  Filled 2018-02-04: qty 34

## 2018-02-04 MED ORDER — DIPHENOXYLATE-ATROPINE 2.5-0.025 MG PO TABS: 1.0000 | ORAL_TABLET | Freq: Four times a day (QID) | ORAL | 1 refills | Status: AC | PRN

## 2018-02-04 MED ORDER — ATROPINE SULFATE 1 MG/ML IJ SOLN
INTRAMUSCULAR | Status: AC
  Filled 2018-02-04: qty 1

## 2018-02-04 MED ORDER — SODIUM CHLORIDE 0.9 % IV SOLN
100.0000 mg/m2 | Freq: Once | INTRAVENOUS | Status: AC
  Administered 2018-02-04: 180 mg via INTRAVENOUS
  Filled 2018-02-04: qty 9

## 2018-02-04 MED ORDER — PALONOSETRON HCL INJECTION 0.25 MG/5ML
0.2500 mg | Freq: Once | INTRAVENOUS | Status: AC
  Administered 2018-02-04: 0.25 mg via INTRAVENOUS

## 2018-02-04 MED ORDER — ATROPINE SULFATE 1 MG/ML IJ SOLN
0.5000 mg | Freq: Once | INTRAMUSCULAR | Status: AC | PRN
  Administered 2018-02-04: 0.5 mg via INTRAVENOUS

## 2018-02-04 NOTE — Patient Instructions (Signed)
[facility] Discharge Instructions for Patients Receiving Chemotherapy  Today you received the following chemotherapy agents Oxaliplatin (ELOXATIN), Leucovorin, Irinotecan (CAMPTOSAR) & Fluorouracil (ADRUCIL).  To help prevent nausea and vomiting after your treatment, we encourage you to take your nausea medication as prescribed.   If you develop nausea and vomiting that is not controlled by your nausea medication, call the clinic.   BELOW ARE SYMPTOMS THAT SHOULD BE REPORTED IMMEDIATELY:  *FEVER GREATER THAN 100.5 F  *CHILLS WITH OR WITHOUT FEVER  NAUSEA AND VOMITING THAT IS NOT CONTROLLED WITH YOUR NAUSEA MEDICATION  *UNUSUAL SHORTNESS OF BREATH  *UNUSUAL BRUISING OR BLEEDING  TENDERNESS IN MOUTH AND THROAT WITH OR WITHOUT PRESENCE OF ULCERS  *URINARY PROBLEMS  *BOWEL PROBLEMS  UNUSUAL RASH Items with * indicate a potential emergency and should be followed up as soon as possible.  Feel free to call the clinic should you have any questions or concerns. The clinic phone number is [phone].  Please show the CHEMO ALERT CARD at check-in to the Emergency Department and triage nurse.   

## 2018-02-04 NOTE — Telephone Encounter (Signed)
Printed avs and calender of upcoming appointment. Per 12/9 los. Missing appointments in December was verified by patient as approved off time by [facility]

## 2018-02-04 NOTE — Telephone Encounter (Signed)
No los per 12/09.

## 2018-02-06 ENCOUNTER — Inpatient Hospital Stay: Payer: Medicare Other

## 2018-02-06 VITALS — BP 134/71 | HR 82 | Temp 98.5°F | Resp 18

## 2018-02-06 DIAGNOSIS — Z5111 Encounter for antineoplastic chemotherapy: Secondary | ICD-10-CM | POA: Diagnosis not present

## 2018-02-06 DIAGNOSIS — C250 Malignant neoplasm of head of pancreas: Secondary | ICD-10-CM

## 2018-02-06 DIAGNOSIS — Z452 Encounter for adjustment and management of vascular access device: Secondary | ICD-10-CM | POA: Diagnosis not present

## 2018-02-06 DIAGNOSIS — Z5189 Encounter for other specified aftercare: Secondary | ICD-10-CM | POA: Diagnosis not present

## 2018-02-06 DIAGNOSIS — D6959 Other secondary thrombocytopenia: Secondary | ICD-10-CM | POA: Diagnosis not present

## 2018-02-06 DIAGNOSIS — Z8542 Personal history of malignant neoplasm of other parts of uterus: Secondary | ICD-10-CM | POA: Diagnosis not present

## 2018-02-06 DIAGNOSIS — Z7189 Other specified counseling: Secondary | ICD-10-CM

## 2018-02-06 MED ORDER — SODIUM CHLORIDE 0.9% FLUSH
10.0000 mL | INTRAVENOUS | Status: DC | PRN
  Administered 2018-02-06: 10 mL
  Filled 2018-02-06: qty 10

## 2018-02-06 MED ORDER — HEPARIN SOD (PORK) LOCK FLUSH 100 UNIT/ML IV SOLN
500.0000 [IU] | Freq: Once | INTRAVENOUS | Status: AC | PRN
  Administered 2018-02-06: 500 [IU]
  Filled 2018-02-06: qty 5

## 2018-02-06 MED ORDER — PEGFILGRASTIM INJECTION 6 MG/0.6ML ~~LOC~~
6.0000 mg | PREFILLED_SYRINGE | Freq: Once | SUBCUTANEOUS | Status: AC
  Administered 2018-02-06: 6 mg via SUBCUTANEOUS

## 2018-02-06 MED ORDER — PEGFILGRASTIM INJECTION 6 MG/0.6ML ~~LOC~~
PREFILLED_SYRINGE | SUBCUTANEOUS | Status: AC
  Filled 2018-02-06: qty 0.6

## 2018-02-09 ENCOUNTER — Other Ambulatory Visit: Payer: Self-pay | Admitting: Hematology

## 2018-02-12 ENCOUNTER — Other Ambulatory Visit: Payer: Self-pay | Admitting: Family Medicine

## 2018-02-12 DIAGNOSIS — Z1231 Encounter for screening mammogram for malignant neoplasm of breast: Secondary | ICD-10-CM

## 2018-02-13 ENCOUNTER — Other Ambulatory Visit: Payer: Self-pay | Admitting: Hematology

## 2018-02-13 DIAGNOSIS — C250 Malignant neoplasm of head of pancreas: Secondary | ICD-10-CM

## 2018-02-25 ENCOUNTER — Other Ambulatory Visit: Payer: Self-pay | Admitting: Hematology

## 2018-03-04 ENCOUNTER — Other Ambulatory Visit: Payer: Self-pay

## 2018-03-04 ENCOUNTER — Inpatient Hospital Stay: Payer: Medicare Other

## 2018-03-04 ENCOUNTER — Inpatient Hospital Stay: Payer: Medicare Other | Attending: Hematology

## 2018-03-04 ENCOUNTER — Telehealth: Payer: Self-pay

## 2018-03-04 ENCOUNTER — Inpatient Hospital Stay (HOSPITAL_BASED_OUTPATIENT_CLINIC_OR_DEPARTMENT_OTHER): Payer: Medicare Other | Admitting: Hematology

## 2018-03-04 ENCOUNTER — Encounter: Payer: Self-pay | Admitting: Hematology

## 2018-03-04 VITALS — BP 126/71 | HR 87 | Temp 97.6°F | Resp 18 | Ht 62.0 in | Wt 141.0 lb

## 2018-03-04 DIAGNOSIS — E876 Hypokalemia: Secondary | ICD-10-CM | POA: Diagnosis not present

## 2018-03-04 DIAGNOSIS — I10 Essential (primary) hypertension: Secondary | ICD-10-CM | POA: Diagnosis not present

## 2018-03-04 DIAGNOSIS — Z5111 Encounter for antineoplastic chemotherapy: Secondary | ICD-10-CM | POA: Insufficient documentation

## 2018-03-04 DIAGNOSIS — Z452 Encounter for adjustment and management of vascular access device: Secondary | ICD-10-CM | POA: Diagnosis not present

## 2018-03-04 DIAGNOSIS — D6959 Other secondary thrombocytopenia: Secondary | ICD-10-CM | POA: Insufficient documentation

## 2018-03-04 DIAGNOSIS — E039 Hypothyroidism, unspecified: Secondary | ICD-10-CM

## 2018-03-04 DIAGNOSIS — G629 Polyneuropathy, unspecified: Secondary | ICD-10-CM

## 2018-03-04 DIAGNOSIS — Z5189 Encounter for other specified aftercare: Secondary | ICD-10-CM | POA: Diagnosis not present

## 2018-03-04 DIAGNOSIS — K1231 Oral mucositis (ulcerative) due to antineoplastic therapy: Secondary | ICD-10-CM | POA: Insufficient documentation

## 2018-03-04 DIAGNOSIS — C250 Malignant neoplasm of head of pancreas: Secondary | ICD-10-CM

## 2018-03-04 DIAGNOSIS — Z8542 Personal history of malignant neoplasm of other parts of uterus: Secondary | ICD-10-CM

## 2018-03-04 DIAGNOSIS — K1379 Other lesions of oral mucosa: Secondary | ICD-10-CM

## 2018-03-04 DIAGNOSIS — Z95828 Presence of other vascular implants and grafts: Secondary | ICD-10-CM

## 2018-03-04 DIAGNOSIS — Z7189 Other specified counseling: Secondary | ICD-10-CM

## 2018-03-04 LAB — CBC WITH DIFFERENTIAL (CANCER CENTER ONLY)
ABS IMMATURE GRANULOCYTES: 0.02 10*3/uL (ref 0.00–0.07)
BASOS PCT: 0 %
Basophils Absolute: 0 10*3/uL (ref 0.0–0.1)
Eosinophils Absolute: 0.1 10*3/uL (ref 0.0–0.5)
Eosinophils Relative: 1 %
HCT: 31.8 % — ABNORMAL LOW (ref 36.0–46.0)
Hemoglobin: 10.5 g/dL — ABNORMAL LOW (ref 12.0–15.0)
Immature Granulocytes: 0 %
Lymphocytes Relative: 15 %
Lymphs Abs: 0.7 10*3/uL (ref 0.7–4.0)
MCH: 33.7 pg (ref 26.0–34.0)
MCHC: 33 g/dL (ref 30.0–36.0)
MCV: 101.9 fL — ABNORMAL HIGH (ref 80.0–100.0)
Monocytes Absolute: 0.6 10*3/uL (ref 0.1–1.0)
Monocytes Relative: 13 %
NEUTROS ABS: 3.3 10*3/uL (ref 1.7–7.7)
Neutrophils Relative %: 71 %
Platelet Count: 95 10*3/uL — ABNORMAL LOW (ref 150–400)
RBC: 3.12 MIL/uL — ABNORMAL LOW (ref 3.87–5.11)
RDW: 16.8 % — ABNORMAL HIGH (ref 11.5–15.5)
WBC Count: 4.8 10*3/uL (ref 4.0–10.5)
nRBC: 0 % (ref 0.0–0.2)

## 2018-03-04 LAB — CMP (CANCER CENTER ONLY)
ALT: 18 U/L (ref 0–44)
AST: 19 U/L (ref 15–41)
Albumin: 3.2 g/dL — ABNORMAL LOW (ref 3.5–5.0)
Alkaline Phosphatase: 187 U/L — ABNORMAL HIGH (ref 38–126)
Anion gap: 8 (ref 5–15)
BUN: 14 mg/dL (ref 8–23)
CO2: 26 mmol/L (ref 22–32)
Calcium: 9.4 mg/dL (ref 8.9–10.3)
Chloride: 105 mmol/L (ref 98–111)
Creatinine: 0.77 mg/dL (ref 0.44–1.00)
GFR, Est AFR Am: >60 mL/min (ref >60)
GFR, Estimated: >60 mL/min (ref >60)
Glucose, Bld: 156 mg/dL — ABNORMAL HIGH (ref 70–99)
Potassium: 4.2 mmol/L (ref 3.5–5.1)
Sodium: 139 mmol/L (ref 135–145)
Total Bilirubin: 0.6 mg/dL (ref 0.3–1.2)
Total Protein: 6.3 g/dL — ABNORMAL LOW (ref 6.5–8.1)

## 2018-03-04 MED ORDER — PALONOSETRON HCL INJECTION 0.25 MG/5ML
INTRAVENOUS | Status: AC
  Filled 2018-03-04: qty 5

## 2018-03-04 MED ORDER — MAGIC MOUTHWASH: ORAL | 1 refills | Status: DC

## 2018-03-04 MED ORDER — SODIUM CHLORIDE 0.9 % IV SOLN
100.0000 mg/m2 | Freq: Once | INTRAVENOUS | Status: AC
  Administered 2018-03-04: 180 mg via INTRAVENOUS
  Filled 2018-03-04: qty 9

## 2018-03-04 MED ORDER — IRINOTECAN HCL CHEMO INJECTION 100 MG/5ML
100.0000 mg/m2 | Freq: Once | INTRAVENOUS | Status: DC
  Filled 2018-03-04: qty 9

## 2018-03-04 MED ORDER — PALONOSETRON HCL INJECTION 0.25 MG/5ML
0.2500 mg | Freq: Once | INTRAVENOUS | Status: AC
  Administered 2018-03-04: 0.25 mg via INTRAVENOUS

## 2018-03-04 MED ORDER — SODIUM CHLORIDE 0.9% FLUSH
10.0000 mL | INTRAVENOUS | Status: DC | PRN
  Administered 2018-03-04: 10 mL
  Filled 2018-03-04: qty 10

## 2018-03-04 MED ORDER — ATROPINE SULFATE 1 MG/ML IJ SOLN
0.5000 mg | Freq: Once | INTRAMUSCULAR | Status: AC | PRN
  Administered 2018-03-04: 0.5 mg via INTRAVENOUS

## 2018-03-04 MED ORDER — DEXAMETHASONE SODIUM PHOSPHATE 10 MG/ML IJ SOLN
INTRAMUSCULAR | Status: AC
  Filled 2018-03-04: qty 1

## 2018-03-04 MED ORDER — DEXTROSE 5 % IV SOLN
Freq: Once | INTRAVENOUS | Status: AC
  Administered 2018-03-04: 10:00:00 via INTRAVENOUS
  Filled 2018-03-04: qty 250

## 2018-03-04 MED ORDER — SODIUM CHLORIDE 0.9 % IV SOLN
2000.0000 mg/m2 | INTRAVENOUS | Status: DC
  Administered 2018-03-04: 3400 mg via INTRAVENOUS
  Filled 2018-03-04: qty 68

## 2018-03-04 MED ORDER — DEXAMETHASONE SODIUM PHOSPHATE 10 MG/ML IJ SOLN
10.0000 mg | Freq: Once | INTRAMUSCULAR | Status: AC
  Administered 2018-03-04: 10 mg via INTRAVENOUS

## 2018-03-04 MED ORDER — OXALIPLATIN CHEMO INJECTION 100 MG/20ML
50.0000 mg/m2 | Freq: Once | INTRAVENOUS | Status: AC
  Administered 2018-03-04: 85 mg via INTRAVENOUS
  Filled 2018-03-04: qty 17

## 2018-03-04 MED ORDER — SODIUM CHLORIDE 0.9 % IV SOLN
400.0000 mg/m2 | Freq: Once | INTRAVENOUS | Status: AC
  Administered 2018-03-04: 680 mg via INTRAVENOUS
  Filled 2018-03-04: qty 34

## 2018-03-04 MED ORDER — ATROPINE SULFATE 1 MG/ML IJ SOLN
INTRAMUSCULAR | Status: AC
  Filled 2018-03-04: qty 1

## 2018-03-04 NOTE — Telephone Encounter (Signed)
Printed avs and calender of upcoming appointment. Per 1/6 los 

## 2018-03-04 NOTE — Progress Notes (Signed)
Per Dr. Burr Medico, ok to tx with platelets today.

## 2018-03-04 NOTE — Progress Notes (Signed)
[facility]   [phone] [phone]) [phone]   Clinic Follow up Note   Patient Care Team: Darcus Austin, MD as PCP - General (Family Medicine) Arta Silence, MD as Consulting Physician (Gastroenterology) Truitt Merle, MD as Consulting Physician (Hematology)  Date of Service:  03/04/2018  CHIEF COMPLAINT: F/u of pancreatic cancer  SUMMARY OF ONCOLOGIC HISTORY: Oncology History   Cancer Staging Pancreatic cancer [facility]) Staging form: Exocrine Pancreas, AJCC 8th Edition - Clinical stage from 06/13/2017: Stage III (cT4, cN0, cM0) - Signed by Truitt Merle, MD on 06/13/2017       Pancreatic cancer ([place])   06/08/2017 Imaging    06/08/2017 CT Abdomen IMPRESSION: Irregular solid hypoechoic mass within the pancreatic head, 2.6 x 2.2 cm with associated pancreatic ductal dilatation and pancreatic atrophy, most compatible with pancreatic cancer. 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  Diffuse colonic diverticulosis. Slight stranding around the distal descending colon may reflect early active diverticulitis.  Bilateral renal parapelvic cysts.  Aortic atherosclerosis.    06/13/2017 Initial Diagnosis    Pancreatic cancer ([place])    06/13/2017 Cancer Staging    Staging form: Exocrine Pancreas, AJCC 8th Edition - Clinical stage from 06/13/2017: Stage III (cT4, cN0, cM0) - Signed by Truitt Merle, MD on 06/13/2017    06/13/2017 Procedure    EUS by Dr. Paulita Fujita 06/13/17  IMPRESSION:  -There was no sign of significant pathology in the ampulla. - There was no evidence of significant pathology in the left lobe of the liver. - A mass was identified in the pancreatic head. Abutment SMV; invasion portal confluence; no adenopathy. Fine needle aspiration performed. If this is a pancreatic adenocarcinoma, it would be staged T4/N0/Mx by EUS.    06/13/2017 Initial Biopsy    Diagnosis  06/13/17 FINE NEEDLE ASPIRATION, ENDOSCOPIC, PANCREAS HEAD (SPECIMEN 1 OF 1 COLLECTED 06/13/17): ADENOCARCINOMA. Preliminary Diagnosis Intraoperative Diagnosis: 1-3) ATYPICAL CELLS, ADDITIONAL MATERIAL REQUESTED. (NDK) 4-6 ADENOCARCINOMA. (NDK) Reported to Dr. Paulita Fujita on 06/13/17 @ 11:07am.     06/21/2017 Imaging    CT Chest without contrast IMPRESSION: 1. No findings suspicious for metastatic disease within the chest.  2. Tiny subpleural right lower lobe pulmonary nodule is nonspecific, but likely benign. This can be addressed on follow-up imaging.  3. Aortic Atherosclerosis (ICD10-I70.0).    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  The Hereditary Gene Panel offered by [facility] includes sequencing and/or deletion duplication testing of the following 47 genes: APC, ATM, AXIN2, BARD1, BMPR1A, BRCA1, BRCA2, BRIP1, CDH1, CDK4, CDKN2A (p14ARF), CDKN2A (p16INK4a), CHEK2, CTNNA1, DICER1, EPCAM (Deletion/duplication testing only), GREM1 (promoter region deletion/duplication testing only), KIT, MEN1, MLH1, MSH2, MSH3, MSH6, MUTYH, NBN, NF1, NHTL1, PALB2, PDGFRA, PMS2, POLD1, POLE, PTEN, RAD50, RAD51C, RAD51D, SDHB, SDHC, SDHD, SMAD4, SMARCA4. STK11, TP53, TSC1, TSC2, and VHL.  The following genes were evaluated for sequence changes only: SDHA and HOXB13 c.251G>A variant only. The report date is August 14, 2017.    09/24/2017 Progression    09/24/2017 CT CAP Pancreas: The mass involving the head of pancreas measures 2.5 x 3.8 by 3.1 cm. On the previous exam this measured 2.2 x 2.6 by 3.4 cm.    09/24/2017 Imaging    09/24/2017 CT CAP IMPRESSION: 1. There is been interval local progression of disease with increased  size of head of pancreas neoplasm. There is progressive vascular involvement with partial encasement of the celiac trunk and SMA. Continued encasement and  marked narrowing of the portal venous confluence. 2. No evidence for metastatic adenopathy within the abdomen, liver metastasis or pulmonary metastasis. 3. Aortic atherosclerosis and LAD coronary artery atherosclerotic calcifications. Aortic Atherosclerosis (ICD10-I70.0).    01/04/2018 Imaging    01/04/2018 CT CAP IMPRESSION: 1. No local disease progression identified. The pancreatic head mass is ill-defined and appears slightly smaller. There is chronic partial encasement of the celiac trunk and SMA and chronic short segment occlusion at the SMV/portal vein confluence. 2. No definite evidence of metastatic disease. Stable small hypervascular lesion inferiorly in the right hepatic lobe, likely an incidental vascular lesion. There is slightly greater mesenteric nodularity posterior to the transverse colon, but no other definite signs of peritoneal carcinomatosis. Attention on follow-up recommended. 3. Stable scattered tiny subpleural pulmonary nodules bilaterally, likely benign based on stability, although attention on follow-up recommended. 4. Mild splenomegaly. 5. Aortic Atherosclerosis (ICD10-I70.0). Port-A-Cath tip in the mid right atrium, stable.      CURRENT THERAPY:  -chemo FOLFIRINOX every 2 weeks starting 07/19/17 with dose reduction due to thrombocytopenia.  -Promacta for thrombocytopenia.   INTERVAL HISTORY:  LAURENA VALKO is here for a follow up after chemo break. She presents to the clinic today accompanied by her daughter. She notes her granddaughter got engaged this weekend and would like to be there for the wedding in 2 years.  She notes having the sensation she is wearing sock when she is barefoot. She notes mild tingling, it is mostly numbness. She notes having to focus on balancing but has not had a fall. She denies neuropathy in her hands.  She notes starting to exercise on stationary bike. She also has been having diarrhea with is mostly controlled. She does  have pain in left groin which can be from exercise or diarrhea. This pain only lasts for a few minutes at a time, manageable.   She notes inflammation in her mouth. She does have magic mouthwash at home.    REVIEW OF SYSTEMS:   Constitutional: Denies fevers, chills or abnormal weight loss Eyes: Denies blurriness of vision Ears, nose, mouth, throat, and face: Denies mucositis or sore throat (+) oral inflammation  Respiratory: Denies cough, dyspnea or wheezes Cardiovascular: Denies palpitation, chest discomfort or lower extremity swelling Gastrointestinal:  Denies nausea (+) manageable diarrhea (+) left groin pain  Skin: Denies abnormal skin rashes Lymphatics: Denies new lymphadenopathy or easy bruising Neurological:Denies new weaknesses (+) mild neuropathy in b/l feet Behavioral/Psych: Mood is stable, no new changes  All other systems were reviewed with the patient and are negative.  MEDICAL HISTORY:  Past Medical History:  Diagnosis Date  . Allergic rhinitis    Skin Test 04-14-2009  . Asthma   . Cancer ([place]) 1981   adenocarcinoma of uterus  . 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 Family history of breast cancer   . History of uterine cancer   . Hyperlipemia   . Hypertension   . Insomnia     SURGICAL HISTORY: Past Surgical History:  Procedure Laterality Date  . ABDOMINAL HYSTERECTOMY    . CARPAL TUNNEL RELEASE    . EUS N/A 06/13/2017   Procedure: FULL UPPER ENDOSCOPIC ULTRASOUND (EUS) RADIAL;  Surgeon: Arta Silence, MD;  Location: [facility];  Service: Endoscopy;  Laterality: N/A;  . I&D EXTREMITY  09/17/2011  Procedure: IRRIGATION AND DEBRIDEMENT EXTREMITY;  Surgeon: Roseanne Kaufman, MD;  Location: [facility];  Service: Orthopedics;  Laterality: Left;  with drain placement  . IR FLUORO GUIDE PORT INSERTION RIGHT  07/18/2017  . IR US GUIDE VASC ACCESS RIGHT  07/18/2017  . NASAL SEPTOPLASTY W/ TURBINOPLASTY    . pelvic floor rebuild    .  TONSILLECTOMY    . TOTAL ABDOMINAL HYSTERECTOMY W/ BILATERAL SALPINGOOPHORECTOMY      I have reviewed the social history and family history with the patient and they are unchanged from previous note.  ALLERGIES:  is allergic to cephalexin and nabumetone.  MEDICATIONS:  Current Outpatient Medications  Medication Sig Dispense Refill  . ALPRAZolam (XANAX) 0.5 MG tablet Take 1 tablet (0.5 mg total) by mouth at bedtime as needed for anxiety. 90 tablet 0  . Ascorbic Acid (VITAMIN C) 500 MG PACK Take 100 mg by mouth daily.    Marland Kitchen atorvastatin (LIPITOR) 10 MG tablet Take 10 mg by mouth at bedtime.     Marland Kitchen CALCIUM PO Take 1 tablet by mouth daily.     . Cholecalciferol (VITAMIN D) 2000 units CAPS Take by mouth.    . Cyanocobalamin (VITAMIN B-12 PO) Take 1 tablet by mouth daily.     . diphenoxylate-atropine (LOMOTIL) 2.5-0.025 MG tablet Take 1-2 tablets by mouth 4 (four) times daily as needed for diarrhea or loose stools. 60 tablet 1  . hydrochlorothiazide (HYDRODIURIL) 25 MG tablet Take 1 tablet by mouth daily as needed.     Marland Kitchen HYDROcodone-acetaminophen (NORCO) 5-325 MG tablet Take 1 tablet by mouth every 6 (six) hours as needed for moderate pain. 40 tablet 0  . hyoscyamine (LEVBID) 0.375 MG 12 hr tablet Take 0.375 mg by mouth every 12 (twelve) hours as needed for cramping.     Marland Kitchen KLOR-CON M20 20 MEQ tablet TAKE 1 TABLET BY MOUTH TWICE A DAY 60 tablet 1  . KLOR-CON M20 20 MEQ tablet TAKE 1 TABLET BY MOUTH TWICE A DAY 60 tablet 1  . KLOR-CON M20 20 MEQ tablet TAKE 1 TABLET (20 MEQ TOTAL) BY MOUTH ONCE FOR 1 DOSE. 30 tablet 1  . levothyroxine (SYNTHROID, LEVOTHROID) 25 MCG tablet Take 25 mcg by mouth daily before breakfast.     . lidocaine-prilocaine (EMLA) cream Apply to affected area once 30 g 3  . magic mouthwash w/lidocaine SOLN Take 5 mLs by mouth 3 (three) times daily as needed for mouth pain. 240 mL 0  . metFORMIN (GLUCOPHAGE) 500 MG tablet TAKE 1 TABLET BY MOUTH EVERY DAY WITH BREAKFAST 30 tablet 1    . Omega-3 Fatty Acids (FISH OIL PO) Take 1 capsule by mouth daily.     Marland Kitchen omeprazole (PRILOSEC) 20 MG capsule Take 1 tablet by mouth daily.    . ondansetron (ZOFRAN) 8 MG tablet Take 1 tablet (8 mg total) by mouth 2 (two) times daily as needed for refractory nausea / vomiting. Start on day 3 after chemotherapy. 30 tablet 1  . ONETOUCH DELICA LANCETS 76B MISC USE TO TEST YOUR BLOOD SUGAR ONCE A DAY FASTING AND 2 HRS AFTER A MEAL AS NEEDED  3  . ONETOUCH VERIO test strip USE TO TEST YOUR BLOOD SUGAR ONCE A DAY FASTING & 2 HRS AFTER A MEAL AS NEEDED  3  . prochlorperazine (COMPAZINE) 10 MG tablet Take 1 tablet (10 mg total) by mouth every 6 (six) hours as needed (NAUSEA). 30 tablet 2  . PROMACTA 50 MG tablet TAKE 1 TABLET  BY MOUTH  EVERY DAY ON AN EMPTY STOMACH -- 1 HOUR BEFORE A MEAL OR 2 HOURS AFTER. 30 tablet 0  . ROCKLATAN 0.02-0.005 % SOLN PLACE 1 DROP INTO BOTH EYES AT BEDTIME  12  . traMADol (ULTRAM) 50 MG tablet Take 1-2 tablets every 6 hours as needed for pain 180 tablet 0  . traZODone (DESYREL) 100 MG tablet Take 1 tablet (100 mg total) by mouth at bedtime. (Patient taking differently: Take 50 mg by mouth at bedtime. ) 90 tablet 3   No current facility-administered medications for this visit.    Facility-Administered Medications Ordered in Other Visits  Medication Dose Route Frequency Provider Last Rate Last Dose  . fluorouracil (ADRUCIL) 3,400 mg in sodium chloride 0.9 % 82 mL chemo infusion  2,000 mg/m2 (Treatment Plan Recorded) Intravenous 1 day or 1 dose Truitt Merle, MD      . irinotecan (CAMPTOSAR) 180 mg in sodium chloride 0.9 % 500 mL chemo infusion  100 mg/m2 (Treatment Plan Recorded) Intravenous Once Truitt Merle, MD 339 mL/hr at 03/04/18 1409 180 mg at 03/04/18 1409  . leucovorin 680 mg in sodium chloride 0.9 % 250 mL infusion  400 mg/m2 (Treatment Plan Recorded) Intravenous Once Truitt Merle, MD 189 mL/hr at 03/04/18 1404 680 mg at 03/04/18 1404    PHYSICAL EXAMINATION: ECOG  PERFORMANCE STATUS: 1 - Symptomatic but completely ambulatory  Vitals:   03/04/18 0918  BP: 126/71  Pulse: 87  Resp: 18  Temp: 97.6 F (36.4 C)  SpO2: 98%   Filed Weights   03/04/18 0918  Weight: 141 lb (64 kg)    GENERAL:alert, no distress and comfortable SKIN: skin color, texture, turgor are normal, no rashes or significant lesions EYES: normal, Conjunctiva are pink and non-injected, sclera clear OROPHARYNX:no exudate, no erythema and lips, buccal mucosa, and tongue normal  NECK: supple, thyroid normal size, non-tender, without nodularity LYMPH:  no palpable lymphadenopathy in the cervical, axillary or inguinal LUNGS: clear to auscultation and percussion with normal breathing effort HEART: regular rate & rhythm and no murmurs and no lower extremity edema ABDOMEN:abdomen soft, non-tender and normal bowel sounds Musculoskeletal:no cyanosis of digits and no clubbing  NEURO: alert & oriented x 3 with fluent speech, no focal motor/sensory deficits  LABORATORY DATA:  I have reviewed the data as listed CBC Latest Ref Rng & Units 03/04/2018 02/04/2018 01/21/2018  WBC 4.0 - 10.5 K/uL 4.8 6.0 7.2  Hemoglobin 12.0 - 15.0 g/dL 10.5(L) 9.9(L) 11.1(L)  Hematocrit 36.0 - 46.0 % 31.8(L) 29.9(L) 33.1(L)  Platelets 150 - 400 K/uL 95(L) 90(L) 102(L)     CMP Latest Ref Rng & Units 03/04/2018 02/04/2018 01/21/2018  Glucose 70 - 99 mg/dL 156(H) 167(H) 113(H)  BUN 8 - 23 mg/dL 14 8 7(L)  Creatinine 0.44 - 1.00 mg/dL 0.77 0.75 0.78  Sodium 135 - 145 mmol/L 139 142 142  Potassium 3.5 - 5.1 mmol/L 4.2 4.0 4.0  Chloride 98 - 111 mmol/L 105 106 106  CO2 22 - 32 mmol/L _0 Calcium 8.9 - 10.3 mg/dL 9.4 9.0 9.3  Total Protein 6.5 - 8.1 g/dL 6.3(L) 6.1(L) 6.4(L)  Total Bilirubin 0.3 - 1.2 mg/dL 0.6 0.5 0.6  Alkaline Phos 38 - 126 U/L 187(H) 201(H) 192(H)  AST 15 - 41 U/L _1 ALT 0 - 44 U/L _2 RADIOGRAPHIC STUDIES: I have personally reviewed the radiological images as  listed and agreed with the findings in the report. No results found.  ASSESSMENT & PLAN:  MALAYNA NOORI is a 78 y.o. 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 The EUS showed a 3.0 x 3.0 cm mass in the pancreatic head, with sonographic evidence suggesting invasion into the superior mesenteric artery, the portal vein,, the SMV, and splenic vein.  Unfortunately this is likely unresectable tumor, or at least a borderline resectable disease due to the invasion of SMA. -She has been on first line Chemo FOLFIRINOX since 07/19/17 with dose reduction due to cytopenia, and Promacta for chemo related thrombocytopenia. Tolerating well. -I reviewed her choice to continue with chemo treatment until cancer progression or have consolidation radiation followed by observation until disease progression. I again discussed her option for second opinion for surgical resection at [facility] or [facility]. She agrees with referral to [facility]  -She would like to proceed with chemo treatment until next scan in Feb   -She has mild neuropathy in b/l feet with mostly numbness. If this progresses I will stop Oxaliplatin.  -Will send referral to [facility] for surgical second opinion.  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 Improving. -Glucose at 156 today (03/04/2018)  3. Diarrhea, abdominal pain, and weight loss -Currently on Lomotil for diarrhea, Tramadol for moderate pain, and hydrocodone for severe pain. -Stable.   4. H/o Endometrial Cancer, Stage I, diagnosed in 1981 -Treated with hysterectomy and BSO in 1981. -f/u with OB/GYN and continue annual Pap smears. -Genetics was negative   5. Hypokalemia -Currently on Potassium 12mq  daily, will continue  -K normal at 4.2 today (03/04/2018)  6. HTN, Hypothyroidism -f/u with PCP  7.  Chemo induced thrombocytopenia -Improved with Promacta and chemo dose reduction -Platelets 95K today (03/04/2018), she will restart Promacta today.   8. Goal of care discussion -Patient understand her cancer treatment is palliative, to prolong her life. -She is full code now  9. 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     Plan -I refilled her Magic Mouth wash today, will increase the steroids in the mouth wash due to her mucositis  -Labs reviewed, good to proceed with treatment today with same dose reduced FOLFIRINOX  -continue chemo and f/u every 2 weeks, will order restaging scan on next visit  [facility]   No problem-specific AHermitagenotes found for this encounter.   No orders of the defined types were placed in this encounter.  All questions were answered. The patient knows to call the clinic with any problems, questions or concerns. No barriers to learning was detected. I spent 20 minutes counseling the patient face to face. The total time spent in the appointment was 25 minutes and more than 50% was on counseling and review of test results     YTruitt Merle MD 03/04/2018   I, AJoslyn Devon am acting as scribe for YTruitt Merle MD.   I have reviewed the above documentation for accuracy and completeness, and I agree with the above.

## 2018-03-04 NOTE — Patient Instructions (Signed)
[facility] Discharge Instructions for Patients Receiving Chemotherapy  Today you received the following chemotherapy agents Oxaliplatin (ELOXATIN), Leucovorin, Irinotecan (CAMPTOSAR) & Fluorouracil (ADRUCIL).  To help prevent nausea and vomiting after your treatment, we encourage you to take your nausea medication as prescribed.   If you develop nausea and vomiting that is not controlled by your nausea medication, call the clinic.   BELOW ARE SYMPTOMS THAT SHOULD BE REPORTED IMMEDIATELY:  *FEVER GREATER THAN 100.5 F  *CHILLS WITH OR WITHOUT FEVER  NAUSEA AND VOMITING THAT IS NOT CONTROLLED WITH YOUR NAUSEA MEDICATION  *UNUSUAL SHORTNESS OF BREATH  *UNUSUAL BRUISING OR BLEEDING  TENDERNESS IN MOUTH AND THROAT WITH OR WITHOUT PRESENCE OF ULCERS  *URINARY PROBLEMS  *BOWEL PROBLEMS  UNUSUAL RASH Items with * indicate a potential emergency and should be followed up as soon as possible.  Feel free to call the clinic should you have any questions or concerns. The clinic phone number is [phone].  Please show the CHEMO ALERT CARD at check-in to the Emergency Department and triage nurse.   

## 2018-03-06 ENCOUNTER — Other Ambulatory Visit: Payer: Self-pay | Admitting: Hematology

## 2018-03-06 ENCOUNTER — Inpatient Hospital Stay: Payer: Medicare Other

## 2018-03-06 DIAGNOSIS — Z5111 Encounter for antineoplastic chemotherapy: Secondary | ICD-10-CM | POA: Diagnosis not present

## 2018-03-06 DIAGNOSIS — Z452 Encounter for adjustment and management of vascular access device: Secondary | ICD-10-CM | POA: Diagnosis not present

## 2018-03-06 DIAGNOSIS — E039 Hypothyroidism, unspecified: Secondary | ICD-10-CM | POA: Diagnosis not present

## 2018-03-06 DIAGNOSIS — Z7189 Other specified counseling: Secondary | ICD-10-CM

## 2018-03-06 DIAGNOSIS — C250 Malignant neoplasm of head of pancreas: Secondary | ICD-10-CM | POA: Diagnosis not present

## 2018-03-06 DIAGNOSIS — D6959 Other secondary thrombocytopenia: Secondary | ICD-10-CM | POA: Diagnosis not present

## 2018-03-06 DIAGNOSIS — Z5189 Encounter for other specified aftercare: Secondary | ICD-10-CM | POA: Diagnosis not present

## 2018-03-06 LAB — CANCER ANTIGEN 19-9: CA 19-9: 23 U/mL (ref 0–35)

## 2018-03-06 MED ORDER — HEPARIN SOD (PORK) LOCK FLUSH 100 UNIT/ML IV SOLN
500.0000 [IU] | Freq: Once | INTRAVENOUS | Status: AC | PRN
  Administered 2018-03-06: 500 [IU]
  Filled 2018-03-06: qty 5

## 2018-03-06 MED ORDER — PEGFILGRASTIM INJECTION 6 MG/0.6ML ~~LOC~~
PREFILLED_SYRINGE | SUBCUTANEOUS | Status: AC
  Filled 2018-03-06: qty 0.6

## 2018-03-06 MED ORDER — PEGFILGRASTIM INJECTION 6 MG/0.6ML ~~LOC~~
6.0000 mg | PREFILLED_SYRINGE | Freq: Once | SUBCUTANEOUS | Status: AC
  Administered 2018-03-06: 6 mg via SUBCUTANEOUS

## 2018-03-06 MED ORDER — SODIUM CHLORIDE 0.9% FLUSH
10.0000 mL | INTRAVENOUS | Status: DC | PRN
  Administered 2018-03-06: 10 mL
  Filled 2018-03-06: qty 10

## 2018-03-06 NOTE — Patient Instructions (Signed)
Pegfilgrastim injection What is this medicine? PEGFILGRASTIM (PEG fil gra stim) is a long-acting granulocyte colony-stimulating factor that stimulates the growth of neutrophils, a type of white blood cell important in the body's fight against infection. It is used to reduce the incidence of fever and infection in patients with certain types of cancer who are receiving chemotherapy that affects the bone marrow, and to increase survival after being exposed to high doses of radiation. This medicine may be used for other purposes; ask your health care provider or pharmacist if you have questions. COMMON BRAND NAME(S): Neulasta What should I tell my health care provider before I take this medicine? They need to know if you have any of these conditions: -kidney disease -latex allergy -ongoing radiation therapy -sickle cell disease -skin reactions to acrylic adhesives (On-Body Injector only) -an unusual or allergic reaction to pegfilgrastim, filgrastim, other medicines, foods, dyes, or preservatives -pregnant or trying to get pregnant -breast-feeding How should I use this medicine? This medicine is for injection under the skin. If you get this medicine at home, you will be taught how to prepare and give the pre-filled syringe or how to use the On-body Injector. Refer to the patient Instructions for Use for detailed instructions. Use exactly as directed. Tell your healthcare provider immediately if you suspect that the On-body Injector may not have performed as intended or if you suspect the use of the On-body Injector resulted in a missed or partial dose. It is important that you put your used needles and syringes in a special sharps container. Do not put them in a trash can. If you do not have a sharps container, call your pharmacist or healthcare provider to get one. Talk to your pediatrician regarding the use of this medicine in children. While this drug may be prescribed for selected conditions,  precautions do apply. Overdosage: If you think you have taken too much of this medicine contact a poison control center or emergency room at once. NOTE: This medicine is only for you. Do not share this medicine with others. What if I miss a dose? It is important not to miss your dose. Call your doctor or health care professional if you miss your dose. If you miss a dose due to an On-body Injector failure or leakage, a new dose should be administered as soon as possible using a single prefilled syringe for manual use. What may interact with this medicine? Interactions have not been studied. Give your health care provider a list of all the medicines, herbs, non-prescription drugs, or dietary supplements you use. Also tell them if you smoke, drink alcohol, or use illegal drugs. Some items may interact with your medicine. This list may not describe all possible interactions. Give your health care provider a list of all the medicines, herbs, non-prescription drugs, or dietary supplements you use. Also tell them if you smoke, drink alcohol, or use illegal drugs. Some items may interact with your medicine. What should I watch for while using this medicine? You may need blood work done while you are taking this medicine. If you are going to need a MRI, CT scan, or other procedure, tell your doctor that you are using this medicine (On-Body Injector only). What side effects may I notice from receiving this medicine? Side effects that you should report to your doctor or health care professional as soon as possible: -allergic reactions like skin rash, itching or hives, swelling of the face, lips, or tongue -dizziness -fever -pain, redness, or irritation at site   where injected -pinpoint red spots on the skin -red or dark-brown urine -shortness of breath or breathing problems -stomach or side pain, or pain at the shoulder -swelling -tiredness -trouble passing urine or change in the amount of urine Side  effects that usually do not require medical attention (report to your doctor or health care professional if they continue or are bothersome): -bone pain -muscle pain This list may not describe all possible side effects. Call your doctor for medical advice about side effects. You may report side effects to FDA at 1-800-FDA-1088. Where should I keep my medicine? Keep out of the reach of children. Store pre-filled syringes in a refrigerator between 2 and 8 degrees C (36 and 46 degrees F). Do not freeze. Keep in carton to protect from light. Throw away this medicine if it is left out of the refrigerator for more than 48 hours. Throw away any unused medicine after the expiration date. NOTE: This sheet is a summary. It may not cover all possible information. If you have questions about this medicine, talk to your doctor, pharmacist, or health care provider.  2018 [employer] (2016-02-10 12:58:03)  

## 2018-03-07 ENCOUNTER — Telehealth: Payer: Self-pay

## 2018-03-07 NOTE — Telephone Encounter (Signed)
Left voice message for [facility] regarding script that was phoned in on Monday 1/6 for Magic Mouthwash, requested call back from pharmacy. Called patient to let her know.

## 2018-03-11 ENCOUNTER — Other Ambulatory Visit: Payer: Self-pay | Admitting: Hematology

## 2018-03-11 DIAGNOSIS — C250 Malignant neoplasm of head of pancreas: Secondary | ICD-10-CM

## 2018-03-18 ENCOUNTER — Telehealth: Payer: Self-pay | Admitting: Hematology

## 2018-03-18 ENCOUNTER — Inpatient Hospital Stay (HOSPITAL_BASED_OUTPATIENT_CLINIC_OR_DEPARTMENT_OTHER): Payer: Medicare Other | Admitting: Hematology

## 2018-03-18 ENCOUNTER — Inpatient Hospital Stay: Payer: Medicare Other

## 2018-03-18 ENCOUNTER — Encounter: Payer: Self-pay | Admitting: Hematology

## 2018-03-18 VITALS — BP 124/69 | HR 82 | Temp 98.2°F | Resp 18 | Ht 62.0 in | Wt 140.2 lb

## 2018-03-18 DIAGNOSIS — I10 Essential (primary) hypertension: Secondary | ICD-10-CM | POA: Diagnosis not present

## 2018-03-18 DIAGNOSIS — E039 Hypothyroidism, unspecified: Secondary | ICD-10-CM

## 2018-03-18 DIAGNOSIS — D6959 Other secondary thrombocytopenia: Secondary | ICD-10-CM | POA: Diagnosis not present

## 2018-03-18 DIAGNOSIS — Z8542 Personal history of malignant neoplasm of other parts of uterus: Secondary | ICD-10-CM

## 2018-03-18 DIAGNOSIS — Z95828 Presence of other vascular implants and grafts: Secondary | ICD-10-CM

## 2018-03-18 DIAGNOSIS — C250 Malignant neoplasm of head of pancreas: Secondary | ICD-10-CM

## 2018-03-18 DIAGNOSIS — G629 Polyneuropathy, unspecified: Secondary | ICD-10-CM | POA: Diagnosis not present

## 2018-03-18 DIAGNOSIS — E876 Hypokalemia: Secondary | ICD-10-CM | POA: Diagnosis not present

## 2018-03-18 DIAGNOSIS — Z452 Encounter for adjustment and management of vascular access device: Secondary | ICD-10-CM | POA: Diagnosis not present

## 2018-03-18 DIAGNOSIS — K1231 Oral mucositis (ulcerative) due to antineoplastic therapy: Secondary | ICD-10-CM

## 2018-03-18 DIAGNOSIS — Z5189 Encounter for other specified aftercare: Secondary | ICD-10-CM | POA: Diagnosis not present

## 2018-03-18 DIAGNOSIS — Z5111 Encounter for antineoplastic chemotherapy: Secondary | ICD-10-CM | POA: Diagnosis not present

## 2018-03-18 LAB — CBC WITH DIFFERENTIAL (CANCER CENTER ONLY)
Abs Immature Granulocytes: 0.02 10*3/uL (ref 0.00–0.07)
Basophils Absolute: 0 10*3/uL (ref 0.0–0.1)
Basophils Relative: 0 %
EOS ABS: 0.1 10*3/uL (ref 0.0–0.5)
EOS PCT: 2 %
HCT: 29.7 % — ABNORMAL LOW (ref 36.0–46.0)
Hemoglobin: 10.2 g/dL — ABNORMAL LOW (ref 12.0–15.0)
Immature Granulocytes: 0 %
Lymphocytes Relative: 16 %
Lymphs Abs: 0.7 10*3/uL (ref 0.7–4.0)
MCH: 34.7 pg — ABNORMAL HIGH (ref 26.0–34.0)
MCHC: 34.3 g/dL (ref 30.0–36.0)
MCV: 101 fL — ABNORMAL HIGH (ref 80.0–100.0)
Monocytes Absolute: 0.4 10*3/uL (ref 0.1–1.0)
Monocytes Relative: 9 %
Neutro Abs: 3.3 10*3/uL (ref 1.7–7.7)
Neutrophils Relative %: 73 %
PLATELETS: 63 10*3/uL — AB (ref 150–400)
RBC: 2.94 MIL/uL — ABNORMAL LOW (ref 3.87–5.11)
RDW: 14.8 % (ref 11.5–15.5)
WBC Count: 4.5 10*3/uL (ref 4.0–10.5)
nRBC: 0 % (ref 0.0–0.2)

## 2018-03-18 LAB — CMP (CANCER CENTER ONLY)
ALT: 14 U/L (ref 0–44)
ANION GAP: 8 (ref 5–15)
AST: 17 U/L (ref 15–41)
Albumin: 3.3 g/dL — ABNORMAL LOW (ref 3.5–5.0)
Alkaline Phosphatase: 193 U/L — ABNORMAL HIGH (ref 38–126)
BUN: 8 mg/dL (ref 8–23)
CO2: 26 mmol/L (ref 22–32)
Calcium: 9.1 mg/dL (ref 8.9–10.3)
Chloride: 107 mmol/L (ref 98–111)
Creatinine: 0.76 mg/dL (ref 0.44–1.00)
GFR, Est AFR Am: >60 mL/min (ref >60)
GFR, Estimated: >60 mL/min (ref >60)
Glucose, Bld: 186 mg/dL — ABNORMAL HIGH (ref 70–99)
Potassium: 4.3 mmol/L (ref 3.5–5.1)
Sodium: 141 mmol/L (ref 135–145)
Total Bilirubin: 0.5 mg/dL (ref 0.3–1.2)
Total Protein: 6.2 g/dL — ABNORMAL LOW (ref 6.5–8.1)

## 2018-03-18 MED ORDER — SODIUM CHLORIDE 0.9% FLUSH
10.0000 mL | INTRAVENOUS | Status: DC | PRN
  Administered 2018-03-18: 10 mL
  Filled 2018-03-18: qty 10

## 2018-03-18 MED ORDER — HEPARIN SOD (PORK) LOCK FLUSH 100 UNIT/ML IV SOLN
500.0000 [IU] | Freq: Once | INTRAVENOUS | Status: AC
  Administered 2018-03-18: 500 [IU] via INTRAVENOUS
  Filled 2018-03-18: qty 5

## 2018-03-18 NOTE — Telephone Encounter (Signed)
Gave dtr avs report and appointments for January and February. Treatment delayed to next week - 1st available day is Wednesday 1/29. Next cycle patient moved back to [facility] per patient request. Central radiology will contact patient re scan once order is entered.

## 2018-03-18 NOTE — Progress Notes (Signed)
Carly [name]   Telephone:([phone] Fax:(336) [phone]   Clinic Follow up Note   Patient Care Team: Darcus Austin, MD (Inactive) as PCP - General (Family Medicine) Arta Silence, MD as Consulting Physician (Gastroenterology) Truitt Merle, MD as Consulting Physician (Hematology)  Date of Service:  03/18/2018  CHIEF COMPLAINT: F/u of pancreatic cancer  SUMMARY OF ONCOLOGIC HISTORY: Oncology History   Cancer Staging Pancreatic cancer [facility]) Staging form: Exocrine Pancreas, AJCC 8th Edition - Clinical stage from 06/13/2017: Stage III (cT4, cN0, cM0) - Signed by Truitt Merle, MD on 06/13/2017       Pancreatic cancer ([place])   06/08/2017 Imaging    06/08/2017 CT Abdomen IMPRESSION: Irregular solid hypoechoic mass within the pancreatic head, 2.6 x 2.2 cm with associated pancreatic ductal dilatation and pancreatic atrophy, most compatible with pancreatic cancer. 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  Diffuse colonic diverticulosis. Slight stranding around the distal descending colon may reflect early active diverticulitis.  Bilateral renal parapelvic cysts.  Aortic atherosclerosis.    06/13/2017 Initial Diagnosis    Pancreatic cancer ([place])    06/13/2017 Cancer Staging    Staging form: Exocrine Pancreas, AJCC 8th Edition - Clinical stage from 06/13/2017: Stage III (cT4, cN0, cM0) - Signed by Truitt Merle, MD on 06/13/2017    06/13/2017 Procedure    EUS by Dr. Paulita Fujita 06/13/17  IMPRESSION:  -There was no sign of significant pathology in the ampulla. - There was no evidence of significant pathology in the left lobe of the liver. - A mass was identified in the pancreatic head. Abutment SMV; invasion portal confluence; no adenopathy. Fine needle aspiration performed. If this is a pancreatic adenocarcinoma, it would be staged T4/N0/Mx by EUS.    06/13/2017 Initial Biopsy    Diagnosis  06/13/17 FINE NEEDLE ASPIRATION, ENDOSCOPIC, PANCREAS HEAD (SPECIMEN 1 OF 1 COLLECTED 06/13/17): ADENOCARCINOMA. Preliminary Diagnosis Intraoperative Diagnosis: 1-3) ATYPICAL CELLS, ADDITIONAL MATERIAL REQUESTED. (NDK) 4-6 ADENOCARCINOMA. (NDK) Reported to Dr. Paulita Fujita on 06/13/17 @ 11:07am.     06/21/2017 Imaging    CT Chest without contrast IMPRESSION: 1. No findings suspicious for metastatic disease within the chest.  2. Tiny subpleural right lower lobe pulmonary nodule is nonspecific, but likely benign. This can be addressed on follow-up imaging.  3. Aortic Atherosclerosis (ICD10-I70.0).    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  The Hereditary Gene Panel offered by [facility] includes sequencing and/or deletion duplication testing of the following 47 genes: APC, ATM, AXIN2, BARD1, BMPR1A, BRCA1, BRCA2, BRIP1, CDH1, CDK4, CDKN2A (p14ARF), CDKN2A (p16INK4a), CHEK2, CTNNA1, DICER1, EPCAM (Deletion/duplication testing only), GREM1 (promoter region deletion/duplication testing only), KIT, MEN1, MLH1, MSH2, MSH3, MSH6, MUTYH, NBN, NF1, NHTL1, PALB2, PDGFRA, PMS2, POLD1, POLE, PTEN, RAD50, RAD51C, RAD51D, SDHB, SDHC, SDHD, SMAD4, SMARCA4. STK11, TP53, TSC1, TSC2, and VHL.  The following genes were evaluated for sequence changes only: SDHA and HOXB13 c.251G>A variant only. The report date is August 14, 2017.    09/24/2017 Progression    09/24/2017 CT CAP Pancreas: The mass involving the head of pancreas measures 2.5 x 3.8 by 3.1 cm. On the previous exam this measured 2.2 x 2.6 by 3.4 cm.    09/24/2017 Imaging    09/24/2017 CT CAP IMPRESSION: 1. There is been interval local progression of disease with  increased size of head of pancreas neoplasm. There is progressive vascular involvement with partial encasement of the celiac trunk and SMA. Continued encasement and  marked narrowing of the portal venous confluence. 2. No evidence for metastatic adenopathy within the abdomen, liver metastasis or pulmonary metastasis. 3. Aortic atherosclerosis and LAD coronary artery atherosclerotic calcifications. Aortic Atherosclerosis (ICD10-I70.0).    01/04/2018 Imaging    01/04/2018 CT CAP IMPRESSION: 1. No local disease progression identified. The pancreatic head mass is ill-defined and appears slightly smaller. There is chronic partial encasement of the celiac trunk and SMA and chronic short segment occlusion at the SMV/portal vein confluence. 2. No definite evidence of metastatic disease. Stable small hypervascular lesion inferiorly in the right hepatic lobe, likely an incidental vascular lesion. There is slightly greater mesenteric nodularity posterior to the transverse colon, but no other definite signs of peritoneal carcinomatosis. Attention on follow-up recommended. 3. Stable scattered tiny subpleural pulmonary nodules bilaterally, likely benign based on stability, although attention on follow-up recommended. 4. Mild splenomegaly. 5. Aortic Atherosclerosis (ICD10-I70.0). Port-A-Cath tip in the mid right atrium, stable.      CURRENT THERAPY:  -chemo FOLFIRINOX every 2 weeksstarting 07/19/17 with dose reduction due to thrombocytopenia.  -Promacta for thrombocytopenia.  INTERVAL HISTORY:  Carly [name] is here for a follow up of treatment. She presents to the clinic today with her daughter. She notes significant knee pain and thinking about getting b/l injections soon for every weeks for 3 weeks. She notes having blurred vision and plans to get cataract surgery in 03/2018. She notes she is taking Promacta 50m. PLT at 63K today.    REVIEW OF SYSTEMS:   Constitutional: Denies fevers, chills or abnormal weight loss Eyes: (+) Blurred vision from cataract Ears, nose, mouth, throat, and face: Denies mucositis or sore throat Respiratory: Denies  cough, dyspnea or wheezes Cardiovascular: Denies palpitation, chest discomfort or lower extremity swelling Gastrointestinal:  Denies nausea, heartburn or change in bowel habits Skin: Denies abnormal skin rashes MSK: (+) Knee pain  Lymphatics: Denies new lymphadenopathy or easy bruising Neurological:Denies numbness, tingling or new weaknesses Behavioral/Psych: Mood is stable, no new changes  All other systems were reviewed with the patient and are negative.  MEDICAL HISTORY:  Past Medical History:  Diagnosis Date  . Allergic rhinitis    Skin Test 04-14-2009  . Asthma   . [place]   adenocarcinoma of [place]  . 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 Family history of breast cancer   . History of uterine cancer   . Hyperlipemia   . Hypertension   . Insomnia     SURGICAL HISTORY: Past Surgical History:  Procedure Laterality Date  . ABDOMINAL HYSTERECTOMY    . CARPAL TUNNEL RELEASE    . EUS N/A 06/13/2017   Procedure: FULL UPPER ENDOSCOPIC ULTRASOUND (EUS) RADIAL;  Surgeon: OArta Silence MD;  Location: [facility];  Service: Endoscopy;  Laterality: N/A;  . I&D EXTREMITY  09/17/2011   Procedure: IRRIGATION AND DEBRIDEMENT EXTREMITY;  Surgeon: WRoseanne Kaufman MD;  [place]  Service: Orthopedics;  Laterality: Left;  with drain placement  . IR FLUORO GUIDE PORT INSERTION RIGHT  07/18/2017  . IR [place] VASC ACCESS RIGHT  07/18/2017  . NASAL SEPTOPLASTY W/ TURBINOPLASTY    . pelvic floor rebuild    . TONSILLECTOMY    . TOTAL ABDOMINAL HYSTERECTOMY W/ BILATERAL SALPINGOOPHORECTOMY      I have reviewed the social history and family  history with the patient and they are unchanged from previous note.  ALLERGIES:  is allergic to cephalexin and nabumetone.  MEDICATIONS:  Current Outpatient Medications  Medication Sig Dispense Refill  . ALPRAZolam (XANAX) 0.5 MG tablet Take 1 tablet (0.5 mg total) by mouth at bedtime as needed for  anxiety. 90 tablet 0  . Ascorbic Acid (VITAMIN C) 500 MG PACK Take 100 mg by mouth daily.    Marland Kitchen atorvastatin (LIPITOR) 10 MG tablet Take 10 mg by mouth at bedtime.     Marland Kitchen CALCIUM PO Take 1 tablet by mouth daily.     . Cholecalciferol (VITAMIN D) 2000 units CAPS Take by mouth.    . Cyanocobalamin (VITAMIN B-12 PO) Take 1 tablet by mouth daily.     . diphenoxylate-atropine (LOMOTIL) 2.5-0.025 MG tablet Take 1-2 tablets by mouth 4 (four) times daily as needed for diarrhea or loose stools. 60 tablet 1  . hydrochlorothiazide (HYDRODIURIL) 25 MG tablet Take 1 tablet by mouth daily as needed.     Marland Kitchen HYDROcodone-acetaminophen (NORCO) 5-325 MG tablet Take 1 tablet by mouth every 6 (six) hours as needed for moderate pain. 40 tablet 0  . hyoscyamine (LEVBID) 0.375 MG 12 hr tablet Take 0.375 mg by mouth every 12 (twelve) hours as needed for cramping.     Marland Kitchen KLOR-CON M20 20 MEQ tablet TAKE 1 TABLET BY MOUTH TWICE A DAY 60 tablet 1  . KLOR-CON M20 20 MEQ tablet TAKE 1 TABLET BY MOUTH TWICE A DAY 60 tablet 1  . KLOR-CON M20 20 MEQ tablet TAKE 1 TABLET (20 MEQ TOTAL) BY MOUTH ONCE FOR 1 DOSE. 30 tablet 1  . levothyroxine (SYNTHROID, LEVOTHROID) 25 MCG tablet Take 25 mcg by mouth daily before breakfast.     . lidocaine-prilocaine (EMLA) cream Apply to affected area once 30 g 3  . magic mouthwash SOLN Take 5 ml swish and spit three times daily as needed. 100 mL 1  . magic mouthwash w/lidocaine SOLN Take 5 mLs by mouth 3 (three) times daily as needed for mouth pain. 240 mL 0  . metFORMIN (GLUCOPHAGE) 500 MG tablet TAKE 1 TABLET BY MOUTH EVERY DAY WITH BREAKFAST 30 tablet 1  . Omega-3 Fatty Acids (FISH OIL PO) Take 1 capsule by mouth daily.     Marland Kitchen omeprazole (PRILOSEC) 20 MG capsule Take 1 tablet by mouth daily.    . ondansetron (ZOFRAN) 8 MG tablet Take 1 tablet (8 mg total) by mouth 2 (two) times daily as needed for refractory nausea / vomiting. Start on day 3 after chemotherapy. 30 tablet 1  . ONETOUCH DELICA LANCETS  07M MISC USE TO TEST YOUR BLOOD SUGAR ONCE A DAY FASTING AND 2 HRS AFTER A MEAL AS NEEDED  3  . ONETOUCH VERIO test strip USE TO TEST YOUR BLOOD SUGAR ONCE A DAY FASTING & 2 HRS AFTER A MEAL AS NEEDED  3  . prochlorperazine (COMPAZINE) 10 MG tablet Take 1 tablet (10 mg total) by mouth every 6 (six) hours as needed (NAUSEA). 30 tablet 2  . PROMACTA 50 MG tablet TAKE 1 TABLET BY MOUTH EVERY DAY ON AN EMPTY STOMACH - 1 HOUR BEFORE A MEAL OR 2 HOURS AFTER A MEAL 30 tablet 0  . ROCKLATAN 0.02-0.005 % SOLN PLACE 1 DROP INTO BOTH EYES AT BEDTIME  12  . traMADol (ULTRAM) 50 MG tablet Take 1-2 tablets every 6 hours as needed for pain 180 tablet 0  . traZODone (DESYREL) 100 MG tablet Take 1 tablet (100  mg total) by mouth at bedtime. (Patient taking differently: Take 50 mg by mouth at bedtime. ) 90 tablet 3   No current facility-administered medications for this visit.     PHYSICAL EXAMINATION: ECOG PERFORMANCE STATUS: 1 - Symptomatic but completely ambulatory  Vitals:   03/18/18 0914  BP: 124/69  Pulse: 82  Resp: 18  Temp: 98.2 F (36.8 C)  SpO2: 100%   Filed Weights   03/18/18 0914  Weight: 140 lb 3.2 oz (63.6 kg)    GENERAL:alert, no distress and comfortable SKIN: skin color, texture, turgor are normal, no rashes or significant lesions EYES: normal, Conjunctiva are pink and non-injected, sclera clear OROPHARYNX:no exudate, no erythema and lips, buccal mucosa, and tongue normal  NECK: supple, thyroid normal size, non-tender, without nodularity LYMPH:  no palpable lymphadenopathy in the cervical, axillary or inguinal LUNGS: clear to auscultation and percussion with normal breathing effort HEART: regular rate & rhythm and no murmurs and no lower extremity edema ABDOMEN:abdomen soft, non-tender and normal bowel sounds Musculoskeletal:no cyanosis of digits and no clubbing  NEURO: alert & oriented x 3 with fluent speech, no focal motor/sensory deficits  LABORATORY DATA:  I have reviewed  the data as listed CBC Latest Ref Rng & Units 03/18/2018 03/04/2018 02/04/2018  WBC 4.0 - 10.5 K/uL 4.5 4.8 6.0  Hemoglobin 12.0 - 15.0 g/dL 10.2(L) 10.5(L) 9.9(L)  Hematocrit 36.0 - 46.0 % 29.7(L) 31.8(L) 29.9(L)  Platelets 150 - 400 K/uL 63(L) 95(L) 90(L)     CMP Latest Ref Rng & Units 03/18/2018 03/04/2018 02/04/2018  Glucose 70 - 99 mg/dL 186(H) 156(H) 167(H)  BUN 8 - 23 mg/dL _0 Creatinine 0.44 - 1.00 mg/dL 0.76 0.77 0.75  Sodium 135 - 145 mmol/L 141 139 142  Potassium 3.5 - 5.1 mmol/L 4.3 4.2 4.0  Chloride 98 - 111 mmol/L 107 105 106  CO2 22 - 32 mmol/L _1 Calcium 8.9 - 10.3 mg/dL 9.1 9.4 9.0  Total Protein 6.5 - 8.1 g/dL 6.2(L) 6.3(L) 6.1(L)  Total Bilirubin 0.3 - 1.2 mg/dL 0.5 0.6 0.5  Alkaline Phos 38 - 126 U/L 193(H) 187(H) 201(H)  AST 15 - 41 U/L _2 ALT 0 - 44 U/L _3 RADIOGRAPHIC STUDIES: I have personally reviewed the radiological images as listed and agreed with the findings in the report. No results found.   ASSESSMENT & PLAN:  Carly [name] is a 78 y.o. 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 The EUS showed a 3.0 x 3.0 cm mass in the pancreatic head, with sonographic evidence suggesting invasion into the superior mesenteric artery, the portal vein,, the SMV, and splenic vein. Unfortunately this is likely unresectable tumor, or at least a borderline resectable disease due to the invasion of SMA. -She has been on first lineChemo FOLFIRINOX since 07/19/17 with dose reduction due to cytopenia, and Promacta for chemo related thrombocytopenia. Tolerating well -She is considering having b/l knee injection soon. She is also planning to have cataract surgery in 03/2018. She will let me know the dates of these procedures.  -Labs reviewed, Hg at 10.2, PLT  decreased to 63K, Glucose at 186, Alk Phos at 193, potassium normal now. Due to significant thrombocytopenia, will hold chemotherapy today and reschedule to next week.  -She is still waiting to hear  back from [facility] about surgical opinion.  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 Improving. -Glucose at 186 today (03/18/18)  3. Diarrhea, abdominal pain, and weight loss -Currently on Lomotil for diarrhea, Tramadol for moderate pain, and hydrocodone for severe pain. -Stable  4. H/oEndometrialCancer, Stage I, diagnosed in 1981 -Treated with hysterectomy and BSO in 1981. -f/u with OB/GYN and continue annual Pap smears. -Genetics was negative  5.Hypokalemia -Currently on Potassium 31mq daily, will continue  -Potassium normal at 4.3 today (03/18/18)  6.HTN, Hypothyroidism -f/u with PCP  7.Chemo induced thrombocytopenia -Improved with Promacta and chemo dose reduction.  -Platelets decreased to 63K today (03/18/2018), she will increase Promacta to 793m  8.Goal of care discussion -Patient understand her cancer treatment is palliative, to prolong her life. -She is full code now  9. 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  -Stable.     Plan -Labs reviewed, PLT at 63K today. Will hold treatment today and increase Promacta to [id number]  -Please cancel all future appointments  -Lab, flush, and chemo FOLFIRINOX in 1, 3, 5 weeks  -CT abd/pel w contrast  in 2-3 weeks for restaging  -F/u in 3 weeks    No problem-specific Assessment & Plan notes found for this encounter.   Orders Placed This Encounter  Procedures  . CT Abdomen Pelvis W Contrast    Standing Status:   Future    Standing Expiration Date:   03/18/2019    Order Specific Question:   If indicated for the  ordered procedure, I authorize the administration of contrast media per Radiology protocol    Answer:   Yes    Order Specific Question:   Preferred imaging location?    [facility] Order Specific Question:   Is Oral Contrast requested for this exam?    Answer:   Yes, Per Radiology protocol    Order Specific Question:   Radiology Contrast Protocol - do NOT remove file path    Answer:   _0 [email or web address]   All questions were answered. The patient knows to call the clinic with any problems, questions or concerns. No barriers to learning was detected. I spent 20 minutes counseling the patient face to face. The total time spent in the appointment was 25 minutes and more than 50% was on counseling and review of test results     YanTruitt MerleD 03/18/2018   I, AmoJoslyn Devonm acting as scribe for YanTruitt MerleD.   I have reviewed the above documentation for accuracy and completeness, and I agree with the above.

## 2018-03-19 ENCOUNTER — Telehealth: Payer: Self-pay | Admitting: Hematology

## 2018-03-19 NOTE — Telephone Encounter (Signed)
Faxed records to [facility]. The new pt scheduler will call pt with appt. °

## 2018-03-20 ENCOUNTER — Inpatient Hospital Stay: Payer: Medicare Other

## 2018-03-24 ENCOUNTER — Other Ambulatory Visit: Payer: Self-pay | Admitting: Hematology

## 2018-03-27 ENCOUNTER — Inpatient Hospital Stay: Payer: Medicare Other

## 2018-03-27 ENCOUNTER — Telehealth: Payer: Self-pay | Admitting: *Deleted

## 2018-03-27 VITALS — BP 133/70 | HR 86 | Temp 98.1°F | Resp 16 | Wt 140.0 lb

## 2018-03-27 DIAGNOSIS — Z95828 Presence of other vascular implants and grafts: Secondary | ICD-10-CM

## 2018-03-27 DIAGNOSIS — C250 Malignant neoplasm of head of pancreas: Secondary | ICD-10-CM | POA: Diagnosis not present

## 2018-03-27 DIAGNOSIS — Z5189 Encounter for other specified aftercare: Secondary | ICD-10-CM | POA: Diagnosis not present

## 2018-03-27 DIAGNOSIS — Z7189 Other specified counseling: Secondary | ICD-10-CM

## 2018-03-27 DIAGNOSIS — Z452 Encounter for adjustment and management of vascular access device: Secondary | ICD-10-CM | POA: Diagnosis not present

## 2018-03-27 DIAGNOSIS — Z5111 Encounter for antineoplastic chemotherapy: Secondary | ICD-10-CM | POA: Diagnosis not present

## 2018-03-27 DIAGNOSIS — D6959 Other secondary thrombocytopenia: Secondary | ICD-10-CM | POA: Diagnosis not present

## 2018-03-27 DIAGNOSIS — E039 Hypothyroidism, unspecified: Secondary | ICD-10-CM | POA: Diagnosis not present

## 2018-03-27 LAB — CBC WITH DIFFERENTIAL (CANCER CENTER ONLY)
Abs Immature Granulocytes: 0.01 10*3/uL (ref 0.00–0.07)
Basophils Absolute: 0 10*3/uL (ref 0.0–0.1)
Basophils Relative: 0 %
EOS PCT: 1 %
Eosinophils Absolute: 0.1 10*3/uL (ref 0.0–0.5)
HCT: 32.9 % — ABNORMAL LOW (ref 36.0–46.0)
HEMOGLOBIN: 11.1 g/dL — AB (ref 12.0–15.0)
Immature Granulocytes: 0 %
Lymphocytes Relative: 13 %
Lymphs Abs: 0.7 10*3/uL (ref 0.7–4.0)
MCH: 34.4 pg — ABNORMAL HIGH (ref 26.0–34.0)
MCHC: 33.7 g/dL (ref 30.0–36.0)
MCV: 101.9 fL — ABNORMAL HIGH (ref 80.0–100.0)
Monocytes Absolute: 0.6 10*3/uL (ref 0.1–1.0)
Monocytes Relative: 12 %
NRBC: 0 % (ref 0.0–0.2)
Neutro Abs: 4 10*3/uL (ref 1.7–7.7)
Neutrophils Relative %: 74 %
Platelet Count: 133 10*3/uL — ABNORMAL LOW (ref 150–400)
RBC: 3.23 MIL/uL — ABNORMAL LOW (ref 3.87–5.11)
RDW: 14.2 % (ref 11.5–15.5)
WBC Count: 5.4 10*3/uL (ref 4.0–10.5)

## 2018-03-27 LAB — CMP (CANCER CENTER ONLY)
ALT: 17 U/L (ref 0–44)
AST: 19 U/L (ref 15–41)
Albumin: 3.5 g/dL (ref 3.5–5.0)
Alkaline Phosphatase: 178 U/L — ABNORMAL HIGH (ref 38–126)
Anion gap: 8 (ref 5–15)
BUN: 13 mg/dL (ref 8–23)
CO2: 26 mmol/L (ref 22–32)
Calcium: 9.5 mg/dL (ref 8.9–10.3)
Chloride: 105 mmol/L (ref 98–111)
Creatinine: 0.76 mg/dL (ref 0.44–1.00)
Glucose, Bld: 205 mg/dL — ABNORMAL HIGH (ref 70–99)
Potassium: 3.9 mmol/L (ref 3.5–5.1)
SODIUM: 139 mmol/L (ref 135–145)
Total Bilirubin: 0.5 mg/dL (ref 0.3–1.2)
Total Protein: 6.6 g/dL (ref 6.5–8.1)

## 2018-03-27 MED ORDER — ATROPINE SULFATE 1 MG/ML IJ SOLN
0.5000 mg | Freq: Once | INTRAMUSCULAR | Status: AC | PRN
  Administered 2018-03-27: 0.5 mg via INTRAVENOUS

## 2018-03-27 MED ORDER — LEUCOVORIN CALCIUM INJECTION 350 MG: 400.0000 mg/m2 | Freq: Once | INTRAVENOUS | Status: DC

## 2018-03-27 MED ORDER — DEXAMETHASONE SODIUM PHOSPHATE 10 MG/ML IJ SOLN
INTRAMUSCULAR | Status: AC
  Filled 2018-03-27: qty 1

## 2018-03-27 MED ORDER — OXALIPLATIN CHEMO INJECTION 100 MG/20ML
50.0000 mg/m2 | Freq: Once | INTRAVENOUS | Status: AC
  Administered 2018-03-27: 85 mg via INTRAVENOUS
  Filled 2018-03-27: qty 17

## 2018-03-27 MED ORDER — SODIUM CHLORIDE 0.9 % IV SOLN
100.0000 mg/m2 | Freq: Once | INTRAVENOUS | Status: AC
  Administered 2018-03-27: 180 mg via INTRAVENOUS
  Filled 2018-03-27: qty 9

## 2018-03-27 MED ORDER — DEXAMETHASONE SODIUM PHOSPHATE 10 MG/ML IJ SOLN
10.0000 mg | Freq: Once | INTRAMUSCULAR | Status: AC
  Administered 2018-03-27: 10 mg via INTRAVENOUS

## 2018-03-27 MED ORDER — PALONOSETRON HCL INJECTION 0.25 MG/5ML
0.2500 mg | Freq: Once | INTRAVENOUS | Status: AC
  Administered 2018-03-27: 0.25 mg via INTRAVENOUS

## 2018-03-27 MED ORDER — SODIUM CHLORIDE 0.9 % IV SOLN
INTRAVENOUS | Status: DC
  Administered 2018-03-27: 13:00:00 via INTRAVENOUS
  Filled 2018-03-27: qty 250

## 2018-03-27 MED ORDER — SODIUM CHLORIDE 0.9% FLUSH
10.0000 mL | INTRAVENOUS | Status: DC | PRN
  Filled 2018-03-27: qty 10

## 2018-03-27 MED ORDER — ATROPINE SULFATE 1 MG/ML IJ SOLN
INTRAMUSCULAR | Status: AC
  Filled 2018-03-27: qty 1

## 2018-03-27 MED ORDER — SODIUM CHLORIDE 0.9 % IV SOLN
2000.0000 mg/m2 | INTRAVENOUS | Status: DC
  Administered 2018-03-27: 3400 mg via INTRAVENOUS
  Filled 2018-03-27: qty 68

## 2018-03-27 MED ORDER — HEPARIN SOD (PORK) LOCK FLUSH 100 UNIT/ML IV SOLN
500.0000 [IU] | Freq: Once | INTRAVENOUS | Status: DC | PRN
  Filled 2018-03-27: qty 5

## 2018-03-27 MED ORDER — SODIUM CHLORIDE 0.9 % IV SOLN
400.0000 mg/m2 | Freq: Once | INTRAVENOUS | Status: AC
  Administered 2018-03-27: 680 mg via INTRAVENOUS
  Filled 2018-03-27: qty 34

## 2018-03-27 MED ORDER — PALONOSETRON HCL INJECTION 0.25 MG/5ML
INTRAVENOUS | Status: AC
  Filled 2018-03-27: qty 5

## 2018-03-27 MED ORDER — IRINOTECAN HCL CHEMO INJECTION 100 MG/5ML: 100.0000 mg/m2 | Freq: Once | INTRAVENOUS | Status: DC

## 2018-03-27 MED ORDER — SODIUM CHLORIDE 0.9% FLUSH
10.0000 mL | INTRAVENOUS | Status: DC | PRN
  Administered 2018-03-27: 10 mL
  Filled 2018-03-27: qty 10

## 2018-03-27 MED ORDER — DEXTROSE 5 % IV SOLN
Freq: Once | INTRAVENOUS | Status: AC
  Administered 2018-03-27: 09:00:00 via INTRAVENOUS
  Filled 2018-03-27: qty 250

## 2018-03-27 NOTE — Telephone Encounter (Signed)
Medical records faxed to [facility] - Dr Lula Olszewski; RI# [id number]

## 2018-03-27 NOTE — Patient Instructions (Signed)
[facility] Discharge Instructions for Patients Receiving Chemotherapy  Today you received the following chemotherapy agents:  Oxaliplatin, Leucovorin, Irinotecan, Fluorouracil  To help prevent nausea and vomiting after your treatment, we encourage you to take your nausea medication as prescribed.   If you develop nausea and vomiting that is not controlled by your nausea medication, call the clinic.   BELOW ARE SYMPTOMS THAT SHOULD BE REPORTED IMMEDIATELY:  *FEVER GREATER THAN 100.5 F  *CHILLS WITH OR WITHOUT FEVER  NAUSEA AND VOMITING THAT IS NOT CONTROLLED WITH YOUR NAUSEA MEDICATION  *UNUSUAL SHORTNESS OF BREATH  *UNUSUAL BRUISING OR BLEEDING  TENDERNESS IN MOUTH AND THROAT WITH OR WITHOUT PRESENCE OF ULCERS  *URINARY PROBLEMS  *BOWEL PROBLEMS  UNUSUAL RASH Items with * indicate a potential emergency and should be followed up as soon as possible.  Feel free to call the clinic should you have any questions or concerns. The clinic phone number is [phone].  Please show the [place] at check-in to the Emergency Department and triage nurse.

## 2018-03-28 ENCOUNTER — Ambulatory Visit: Payer: Medicare Other

## 2018-03-28 DIAGNOSIS — C250 Malignant neoplasm of head of pancreas: Secondary | ICD-10-CM | POA: Diagnosis not present

## 2018-03-28 DIAGNOSIS — K8689 Other specified diseases of pancreas: Secondary | ICD-10-CM | POA: Diagnosis not present

## 2018-03-29 ENCOUNTER — Inpatient Hospital Stay: Payer: Medicare Other

## 2018-03-29 VITALS — BP 96/77 | HR 82 | Temp 98.5°F | Resp 17

## 2018-03-29 DIAGNOSIS — Z5189 Encounter for other specified aftercare: Secondary | ICD-10-CM | POA: Diagnosis not present

## 2018-03-29 DIAGNOSIS — E039 Hypothyroidism, unspecified: Secondary | ICD-10-CM | POA: Diagnosis not present

## 2018-03-29 DIAGNOSIS — Z7189 Other specified counseling: Secondary | ICD-10-CM

## 2018-03-29 DIAGNOSIS — Z452 Encounter for adjustment and management of vascular access device: Secondary | ICD-10-CM | POA: Diagnosis not present

## 2018-03-29 DIAGNOSIS — C250 Malignant neoplasm of head of pancreas: Secondary | ICD-10-CM

## 2018-03-29 DIAGNOSIS — Z5111 Encounter for antineoplastic chemotherapy: Secondary | ICD-10-CM | POA: Diagnosis not present

## 2018-03-29 DIAGNOSIS — D6959 Other secondary thrombocytopenia: Secondary | ICD-10-CM | POA: Diagnosis not present

## 2018-03-29 MED ORDER — PEGFILGRASTIM INJECTION 6 MG/0.6ML ~~LOC~~
PREFILLED_SYRINGE | SUBCUTANEOUS | Status: AC
  Filled 2018-03-29: qty 0.6

## 2018-03-29 MED ORDER — PEGFILGRASTIM INJECTION 6 MG/0.6ML ~~LOC~~
6.0000 mg | PREFILLED_SYRINGE | Freq: Once | SUBCUTANEOUS | Status: AC
  Administered 2018-03-29: 6 mg via SUBCUTANEOUS

## 2018-03-29 MED ORDER — SODIUM CHLORIDE 0.9% FLUSH
10.0000 mL | INTRAVENOUS | Status: DC | PRN
  Administered 2018-03-29: 10 mL
  Filled 2018-03-29: qty 10

## 2018-03-29 MED ORDER — HEPARIN SOD (PORK) LOCK FLUSH 100 UNIT/ML IV SOLN
500.0000 [IU] | Freq: Once | INTRAVENOUS | Status: AC | PRN
  Administered 2018-03-29: 500 [IU]
  Filled 2018-03-29: qty 5

## 2018-03-29 NOTE — Patient Instructions (Signed)
Pegfilgrastim injection  What is this medicine?  PEGFILGRASTIM (PEG fil gra stim) is a long-acting granulocyte colony-stimulating factor that stimulates the growth of neutrophils, a type of white blood cell important in the body's fight against infection. It is used to reduce the incidence of fever and infection in patients with certain types of cancer who are receiving chemotherapy that affects the bone marrow, and to increase survival after being exposed to high doses of radiation.  This medicine may be used for other purposes; ask your health care provider or pharmacist if you have questions.  COMMON BRAND NAME(S): Fulphila, Neulasta, UDENYCA  What should I tell my health care provider before I take this medicine?  They need to know if you have any of these conditions:  -kidney disease  -latex allergy  -ongoing radiation therapy  -sickle cell disease  -skin reactions to acrylic adhesives (On-Body Injector only)  -an unusual or allergic reaction to pegfilgrastim, filgrastim, other medicines, foods, dyes, or preservatives  -pregnant or trying to get pregnant  -breast-feeding  How should I use this medicine?  This medicine is for injection under the skin. If you get this medicine at home, you will be taught how to prepare and give the pre-filled syringe or how to use the On-body Injector. Refer to the patient Instructions for Use for detailed instructions. Use exactly as directed. Tell your healthcare provider immediately if you suspect that the On-body Injector may not have performed as intended or if you suspect the use of the On-body Injector resulted in a missed or partial dose.  It is important that you put your used needles and syringes in a special sharps container. Do not put them in a trash can. If you do not have a sharps container, call your pharmacist or healthcare provider to get one.  Talk to your pediatrician regarding the use of this medicine in children. While this drug may be prescribed for  selected conditions, precautions do apply.  Overdosage: If you think you have taken too much of this medicine contact a poison control center or emergency room at once.  NOTE: This medicine is only for you. Do not share this medicine with others.  What if I miss a dose?  It is important not to miss your dose. Call your doctor or health care professional if you miss your dose. If you miss a dose due to an On-body Injector failure or leakage, a new dose should be administered as soon as possible using a single prefilled syringe for manual use.  What may interact with this medicine?  Interactions have not been studied.  Give your health care provider a list of all the medicines, herbs, non-prescription drugs, or dietary supplements you use. Also tell them if you smoke, drink alcohol, or use illegal drugs. Some items may interact with your medicine.  This list may not describe all possible interactions. Give your health care provider a list of all the medicines, herbs, non-prescription drugs, or dietary supplements you use. Also tell them if you smoke, drink alcohol, or use illegal drugs. Some items may interact with your medicine.  What should I watch for while using this medicine?  You may need blood work done while you are taking this medicine.  If you are going to need a MRI, CT scan, or other procedure, tell your doctor that you are using this medicine (On-Body Injector only).  What side effects may I notice from receiving this medicine?  Side effects that you should report to   your doctor or health care professional as soon as possible:  -allergic reactions like skin rash, itching or hives, swelling of the face, lips, or tongue  -back pain  -dizziness  -fever  -pain, redness, or irritation at site where injected  -pinpoint red spots on the skin  -red or dark-brown urine  -shortness of breath or breathing problems  -stomach or side pain, or pain at the shoulder  -swelling  -tiredness  -trouble passing urine or  change in the amount of urine  Side effects that usually do not require medical attention (report to your doctor or health care professional if they continue or are bothersome):  -bone pain  -muscle pain  This list may not describe all possible side effects. Call your doctor for medical advice about side effects. You may report side effects to FDA at 1-800-FDA-1088.  Where should I keep my medicine?  Keep out of the reach of children.  If you are using this medicine at home, you will be instructed on how to store it. Throw away any unused medicine after the expiration date on the label.  NOTE: This sheet is a summary. It may not cover all possible information. If you have questions about this medicine, talk to your doctor, pharmacist, or health care provider.   2019 Elsevier/Gold Standard (2017-05-21 16:57:08)

## 2018-03-30 ENCOUNTER — Other Ambulatory Visit: Payer: Self-pay | Admitting: Hematology

## 2018-04-01 ENCOUNTER — Ambulatory Visit: Payer: Medicare Other

## 2018-04-01 ENCOUNTER — Ambulatory Visit: Payer: Medicare Other | Admitting: Nurse Practitioner

## 2018-04-01 ENCOUNTER — Other Ambulatory Visit: Payer: Medicare Other

## 2018-04-02 ENCOUNTER — Encounter (HOSPITAL_COMMUNITY): Payer: Self-pay

## 2018-04-02 ENCOUNTER — Ambulatory Visit (HOSPITAL_COMMUNITY)
Admission: RE | Admit: 2018-04-02 | Discharge: 2018-04-02 | Disposition: A | Discharge status: Home / Self Care | Payer: Medicare Other | Source: Ambulatory Visit | Attending: Hematology | Admitting: Hematology

## 2018-04-02 DIAGNOSIS — C250 Malignant neoplasm of head of pancreas: Secondary | ICD-10-CM | POA: Insufficient documentation

## 2018-04-02 DIAGNOSIS — C259 Malignant neoplasm of pancreas, unspecified: Secondary | ICD-10-CM | POA: Diagnosis not present

## 2018-04-02 MED ORDER — IOHEXOL 300 MG/ML  SOLN
100.0000 mL | Freq: Once | INTRAMUSCULAR | Status: AC | PRN
  Administered 2018-04-02: 100 mL via INTRAVENOUS

## 2018-04-02 MED ORDER — SODIUM CHLORIDE (PF) 0.9 % IJ SOLN
INTRAMUSCULAR | Status: AC
  Filled 2018-04-02: qty 50

## 2018-04-03 ENCOUNTER — Telehealth: Payer: Self-pay | Admitting: Hematology

## 2018-04-03 ENCOUNTER — Ambulatory Visit: Payer: Medicare Other

## 2018-04-03 DIAGNOSIS — F419 Anxiety disorder, unspecified: Secondary | ICD-10-CM | POA: Diagnosis not present

## 2018-04-03 DIAGNOSIS — Z7984 Long term (current) use of oral hypoglycemic drugs: Secondary | ICD-10-CM | POA: Diagnosis not present

## 2018-04-03 DIAGNOSIS — C259 Malignant neoplasm of pancreas, unspecified: Secondary | ICD-10-CM | POA: Diagnosis not present

## 2018-04-03 DIAGNOSIS — K219 Gastro-esophageal reflux disease without esophagitis: Secondary | ICD-10-CM | POA: Diagnosis not present

## 2018-04-03 DIAGNOSIS — E119 Type 2 diabetes mellitus without complications: Secondary | ICD-10-CM | POA: Diagnosis not present

## 2018-04-03 DIAGNOSIS — E039 Hypothyroidism, unspecified: Secondary | ICD-10-CM | POA: Diagnosis not present

## 2018-04-03 NOTE — Telephone Encounter (Signed)
Scheduled appt per 2/4 sch messge - pt is aware of appt date and time

## 2018-04-04 ENCOUNTER — Encounter: Payer: Self-pay | Admitting: Obstetrics & Gynecology

## 2018-04-04 ENCOUNTER — Other Ambulatory Visit: Payer: Medicare Other

## 2018-04-04 ENCOUNTER — Ambulatory Visit (INDEPENDENT_AMBULATORY_CARE_PROVIDER_SITE_OTHER): Payer: Medicare Other | Admitting: Obstetrics & Gynecology

## 2018-04-04 VITALS — BP 134/72 | Ht 62.0 in | Wt 137.0 lb

## 2018-04-04 DIAGNOSIS — M8589 Other specified disorders of bone density and structure, multiple sites: Secondary | ICD-10-CM

## 2018-04-04 DIAGNOSIS — Z90710 Acquired absence of both cervix and uterus: Secondary | ICD-10-CM

## 2018-04-04 DIAGNOSIS — Z9289 Personal history of other medical treatment: Secondary | ICD-10-CM | POA: Diagnosis not present

## 2018-04-04 DIAGNOSIS — M85851 Other specified disorders of bone density and structure, right thigh: Secondary | ICD-10-CM

## 2018-04-04 DIAGNOSIS — C250 Malignant neoplasm of head of pancreas: Secondary | ICD-10-CM

## 2018-04-04 DIAGNOSIS — Z780 Asymptomatic menopausal state: Secondary | ICD-10-CM

## 2018-04-04 DIAGNOSIS — Z01419 Encounter for gynecological examination (general) (routine) without abnormal findings: Secondary | ICD-10-CM

## 2018-04-04 DIAGNOSIS — M85852 Other specified disorders of bone density and structure, left thigh: Secondary | ICD-10-CM

## 2018-04-04 DIAGNOSIS — F419 Anxiety disorder, unspecified: Secondary | ICD-10-CM

## 2018-04-04 MED ORDER — TRAZODONE HCL 100 MG PO TABS: 50.0000 mg | ORAL_TABLET | Freq: Every day | ORAL | 4 refills | Status: DC

## 2018-04-04 NOTE — Patient Instructions (Signed)
1. Well female exam with routine gynecological exam Gynecologic exam status post hysterectomy and menopause.  Pap test February 2019 was negative.  No indication to repeat a Pap test.  Breast exam normal.  Screening mammogram scheduled for next week at the breast center.  Health labs with family physician.  Colonoscopy 5 years ago.  Body mass index 25.06.    2. S/P total hysterectomy  3. Postmenopausal Well on no hormone replacement therapy.  4. Osteopenia of necks of both femurs Osteopenia on bone density March 2019.  Will repeat in March 2021.  Vitamin D supplements, calcium intake of 1.2 to 1.5 g/day and regular walking recommended.  5. Malignant neoplasm of head of pancreas (HCC) Undergoing chemotherapy currently.  6. Anxiety Trazodone represcribed.  Patient has been taking for many years.  Recommend trying without to see if really needs it.  Other orders - traZODone (DESYREL) 100 MG tablet; Take 0.5 tablets (50 mg total) by mouth at bedtime.  Amal, it was a pleasure seeing you today!

## 2018-04-04 NOTE — Progress Notes (Signed)
Carly [name] 08/20/1940 [id number]   History:    78 y.o. G2P2L2 Widowed x 1 year  RP:  Established patient presenting for annual gyn exam   HPI: S/P Total Hysterectomy.  Dx with Pancreatic Ca in 2019, on Chemotherapy.  No pelvic pain.  Abstinent.  Breasts normal.  BMI 25.06.    Past medical history,surgical history, family history and social history were all reviewed and documented in the EPIC chart.  Gynecologic History No LMP recorded. Patient has had a hysterectomy. Contraception: status post hysterectomy Last Pap: 03/2017. Results were: Negative Last mammogram: 02/2017. Results were: Negative Bone Density: 04/2017 Osteopenia Colonoscopy: 5 yrs ago  Obstetric History OB History  Gravida Para Term Preterm AB Living  2 2       2   SAB TAB Ectopic Multiple Live Births               # Outcome Date GA Lbr Len/2nd Weight Sex Delivery Anes PTL Lv  2 Para           1 Para              ROS: A ROS was performed and pertinent positives and negatives are included in the history.  GENERAL: No fevers or chills. HEENT: No change in vision, no earache, sore throat or sinus congestion. NECK: No pain or stiffness. CARDIOVASCULAR: No chest pain or pressure. No palpitations. PULMONARY: No shortness of breath, cough or wheeze. GASTROINTESTINAL: No abdominal pain, nausea, vomiting or diarrhea, melena or bright red blood per rectum. GENITOURINARY: No urinary frequency, urgency, hesitancy or dysuria. MUSCULOSKELETAL: No joint or muscle pain, no back pain, no recent trauma. DERMATOLOGIC: No rash, no itching, no lesions. ENDOCRINE: No polyuria, polydipsia, no heat or cold intolerance. No recent change in weight. HEMATOLOGICAL: No anemia or easy bruising or bleeding. NEUROLOGIC: No headache, seizures, numbness, tingling or weakness. PSYCHIATRIC: No depression, no loss of interest in normal activity or change in sleep pattern.     Exam:   BP 134/72   Ht 5\' 2"  (1.575 m)   Wt 137 lb (62.1 kg)    BMI 25.06 kg/m   Body mass index is 25.06 kg/m.  General appearance : Well developed well nourished female. No acute distress HEENT: Eyes: no retinal hemorrhage or exudates,  Neck supple, trachea midline, no carotid bruits, no thyroidmegaly Lungs: Clear to auscultation, no rhonchi or wheezes, or rib retractions  Heart: Regular rate and rhythm, no murmurs or gallops Breast:Examined in sitting and supine position were symmetrical in appearance, no palpable masses or tenderness,  no skin retraction, no nipple inversion, no nipple discharge, no skin discoloration, no axillary or supraclavicular lymphadenopathy Abdomen: no palpable masses or tenderness, no rebound or guarding Extremities: no edema or skin discoloration or tenderness  Pelvic: Vulva: Normal             Vagina: No gross lesions or discharge  Cervix/Uterus absent  Adnexa  Without masses or tenderness  Anus: Normal   Assessment/Plan:  78 y.o. female for annual exam   1. Well female exam with routine gynecological exam Gynecologic exam status post hysterectomy and menopause.  Pap test February 2019 was negative.  No indication to repeat a Pap test.  Breast exam normal.  Screening mammogram scheduled for next week at the breast center.  Health labs with family physician.  Colonoscopy 5 years ago.  Body mass index 25.06.    2. S/P total hysterectomy  3. Postmenopausal Well on no hormone replacement therapy.  4. Osteopenia of necks of both femurs Osteopenia on bone density March 2019.  Will repeat in March 2021.  Vitamin D supplements, calcium intake of 1.2 to 1.5 g/day and regular walking recommended.  5. Malignant neoplasm of head of pancreas (HCC) Undergoing chemotherapy currently.  6. Anxiety Trazodone represcribed.  Patient has been taking for many years.  Recommend trying without to see if really needs it.  Other orders - traZODone (DESYREL) 100 MG tablet; Take 0.5 tablets (50 mg total) by mouth at  bedtime.  Princess Bruins MD, 9:53 AM 04/04/2018

## 2018-04-05 NOTE — Progress Notes (Signed)
[place]   [phone] [phone]   Clinic Follow up Note   Patient Care Team: [name], MD as PCP - General (Family Medicine) Arta Silence, MD as Consulting Physician (Gastroenterology) Truitt Merle, MD as Consulting Physician (Hematology)  Date of Service:  04/08/2018  CHIEF COMPLAINT:  F/u of pancreatic cancer  SUMMARY OF ONCOLOGIC HISTORY: Oncology History   Cancer Staging Pancreatic cancer [facility]) Staging form: Exocrine Pancreas, AJCC 8th Edition - Clinical stage from 06/13/2017: Stage III (cT4, cN0, cM0) - Signed by Truitt Merle, MD on 06/13/2017       Pancreatic cancer ([place])   06/08/2017 Imaging    06/08/2017 CT Abdomen IMPRESSION: Irregular solid hypoechoic mass within the pancreatic head, 2.6 x 2.2 cm with associated pancreatic ductal dilatation and pancreatic atrophy, most compatible with pancreatic cancer. 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  Diffuse colonic diverticulosis. Slight stranding around the distal descending colon may reflect early active diverticulitis.  Bilateral renal parapelvic cysts.  Aortic atherosclerosis.    06/13/2017 Initial Diagnosis    Pancreatic cancer ([place])    06/13/2017 Cancer Staging    Staging form: Exocrine Pancreas, AJCC 8th Edition - Clinical stage from 06/13/2017: Stage III (cT4, cN0, cM0) - Signed by Truitt Merle, MD on 06/13/2017    06/13/2017 Procedure    EUS by Dr. Paulita Fujita 06/13/17  IMPRESSION:  -There was no sign of significant pathology in the ampulla. - There was no evidence of significant pathology in the left lobe of the liver. - A mass was identified in the pancreatic head. Abutment SMV; invasion portal confluence; no adenopathy. Fine needle aspiration performed. If this is a pancreatic adenocarcinoma, it would be staged T4/N0/Mx by EUS.    06/13/2017 Initial Biopsy    Diagnosis  06/13/17 FINE NEEDLE ASPIRATION, ENDOSCOPIC, PANCREAS HEAD (SPECIMEN 1 OF 1 COLLECTED 06/13/17): ADENOCARCINOMA. Preliminary Diagnosis Intraoperative Diagnosis: 1-3) ATYPICAL CELLS, ADDITIONAL MATERIAL REQUESTED. (NDK) 4-6 ADENOCARCINOMA. (NDK) Reported to Dr. Paulita Fujita on 06/13/17 @ 11:07am.     06/21/2017 Imaging    CT Chest without contrast IMPRESSION: 1. No findings suspicious for metastatic disease within the chest.  2. Tiny subpleural right lower lobe pulmonary nodule is nonspecific, but likely benign. This can be addressed on follow-up imaging.  3. Aortic Atherosclerosis (ICD10-I70.0).    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  The Hereditary Gene Panel offered by [facility] includes sequencing and/or deletion duplication testing of the following 47 genes: APC, ATM, AXIN2, BARD1, BMPR1A, BRCA1, BRCA2, BRIP1, CDH1, CDK4, CDKN2A (p14ARF), CDKN2A (p16INK4a), CHEK2, CTNNA1, DICER1, EPCAM (Deletion/duplication testing only), GREM1 (promoter region deletion/duplication testing only), KIT, MEN1, MLH1, MSH2, MSH3, MSH6, MUTYH, NBN, NF1, NHTL1, PALB2, PDGFRA, PMS2, POLD1, POLE, PTEN, RAD50, RAD51C, RAD51D, SDHB, SDHC, SDHD, SMAD4, SMARCA4. STK11, TP53, TSC1, TSC2, and VHL.  The following genes were evaluated for sequence changes only: SDHA and HOXB13 c.251G>A variant only. The report date is August 14, 2017.    09/24/2017 Progression    09/24/2017 CT CAP Pancreas: The mass involving the head of pancreas measures 2.5 x 3.8 by 3.1 cm. On the previous exam this measured 2.2 x 2.6 by 3.4 cm.    09/24/2017 Imaging    09/24/2017 CT CAP IMPRESSION: 1. There is been interval local progression of disease with  increased size of head of pancreas neoplasm. There is progressive vascular involvement with partial encasement of the celiac trunk and SMA. Continued encasement and  marked narrowing of the portal venous confluence. 2. No evidence for metastatic adenopathy within the abdomen, liver metastasis or pulmonary metastasis. 3. Aortic atherosclerosis and LAD coronary artery atherosclerotic calcifications. Aortic Atherosclerosis (ICD10-I70.0).    01/04/2018 Imaging    01/04/2018 CT CAP IMPRESSION: 1. No local disease progression identified. The pancreatic head mass is ill-defined and appears slightly smaller. There is chronic partial encasement of the celiac trunk and SMA and chronic short segment occlusion at the SMV/portal vein confluence. 2. No definite evidence of metastatic disease. Stable small hypervascular lesion inferiorly in the right hepatic lobe, likely an incidental vascular lesion. There is slightly greater mesenteric nodularity posterior to the transverse colon, but no other definite signs of peritoneal carcinomatosis. Attention on follow-up recommended. 3. Stable scattered tiny subpleural pulmonary nodules bilaterally, likely benign based on stability, although attention on follow-up recommended. 4. Mild splenomegaly. 5. Aortic Atherosclerosis (ICD10-I70.0). Port-A-Cath tip in the mid right atrium, stable.    04/02/2018 Imaging    CT AP  IMPRESSION: 1. Stable appearance of pancreatic head mass with chronic partial encasement of the celiac trunk and SMA and occlusion of the portal venous confluence. 2. No definite evidence for metastatic disease. Similar appearance of subtle soft tissue nodule posterior to the transverse colon without additional signs of peritoneal carcinomatosis. 3. Mild splenomegaly. 4.  Aortic Atherosclerosis (ICD10-I70.0).       CURRENT THERAPY:  -chemo FOLFIRINOX every 2 weeksstarting 5/23/19with dose reduction due to thrombocytopenia. -Promacta for thrombocytopenia, currently ay 70m daily   INTERVAL HISTORY:  PVICTORYA HILLMANis here for a follow up and treatment. She presents to the clinic today with  her daughter-in-law. She notes she is doing well and stable. She notes she is on Promacta 1.5 tabs. She is tolerating. She would like a refill of EMLA cream. She notes her last chemo treatment went well. She notes her neuropathy has currently resolved.  She saw her PCP who encouraged her to take her pain meds, but she has not needed them. She notes she plans to go to [facility] this month.  She notes she would like to take a chemo break in May likely.      REVIEW OF SYSTEMS:   Constitutional: Denies fevers, chills or abnormal weight loss Eyes: Denies blurriness of vision Ears, nose, mouth, throat, and face: Denies mucositis or sore throat Respiratory: Denies cough, dyspnea or wheezes Cardiovascular: Denies palpitation, chest discomfort or lower extremity swelling Gastrointestinal:  Denies nausea, heartburn or change in bowel habits Skin: Denies abnormal skin rashes Lymphatics: Denies new lymphadenopathy or easy bruising Neurological:Denies numbness, tingling or new weaknesses Behavioral/Psych: Mood is stable, no new changes  All other systems were reviewed with the patient and are negative.  MEDICAL HISTORY:  Past Medical History:  Diagnosis Date  . Allergic rhinitis    Skin Test 04-14-2009  . Asthma   . [place]   adenocarcinoma of uterus  . Cancer ([place]   PANCREATIC   . 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 Family history of breast cancer   . History of uterine cancer   . Hyperlipemia   . Hypertension   . Insomnia     SURGICAL HISTORY: Past Surgical History:  Procedure Laterality Date  . ABDOMINAL HYSTERECTOMY    . CARPAL TUNNEL RELEASE    .  EUS N/A 06/13/2017   Procedure: FULL UPPER ENDOSCOPIC ULTRASOUND (EUS) RADIAL;  Surgeon: Arta Silence, MD;  Location: [facility];  Service: Endoscopy;  Laterality: N/A;  . I&D EXTREMITY  09/17/2011   Procedure: IRRIGATION AND DEBRIDEMENT EXTREMITY;  Surgeon: Roseanne Kaufman, MD;   Location: [facility];  Service: Orthopedics;  Laterality: Left;  with drain placement  . IR FLUORO GUIDE PORT INSERTION RIGHT  07/18/2017  . IR US GUIDE VASC ACCESS RIGHT  07/18/2017  . NASAL SEPTOPLASTY W/ TURBINOPLASTY    . pelvic floor rebuild    . TONSILLECTOMY    . TOTAL ABDOMINAL HYSTERECTOMY W/ BILATERAL SALPINGOOPHORECTOMY      I have reviewed the social history and family history with the patient and they are unchanged from previous note.  ALLERGIES:  is allergic to cephalexin and nabumetone.  MEDICATIONS:  Current Outpatient Medications  Medication Sig Dispense Refill  . ALPRAZolam (XANAX) 0.5 MG tablet Take 1 tablet (0.5 mg total) by mouth at bedtime as needed for anxiety. 90 tablet 0  . Ascorbic Acid (VITAMIN C) 500 MG PACK Take 100 mg by mouth daily.    Marland Kitchen atorvastatin (LIPITOR) 10 MG tablet Take 10 mg by mouth at bedtime.     Marland Kitchen CALCIUM PO Take 1 tablet by mouth daily.     . Cholecalciferol (VITAMIN D) 2000 units CAPS Take by mouth.    . Cyanocobalamin (VITAMIN B-12 PO) Take 1 tablet by mouth daily.     . diphenoxylate-atropine (LOMOTIL) 2.5-0.025 MG tablet Take 1-2 tablets by mouth 4 (four) times daily as needed for diarrhea or loose stools. 60 tablet 1  . eltrombopag (PROMACTA) 75 MG tablet Take 1 tablet (75 mg total) by mouth daily. Take on an empty stomach 1 hour before meals or 2 hours after. 30 tablet 2  . hydrochlorothiazide (HYDRODIURIL) 25 MG tablet Take 1 tablet by mouth daily as needed.     Marland Kitchen HYDROcodone-acetaminophen (NORCO) 5-325 MG tablet Take 1 tablet by mouth every 6 (six) hours as needed for moderate pain. 40 tablet 0  . hyoscyamine (LEVBID) 0.375 MG 12 hr tablet Take 0.375 mg by mouth every 12 (twelve) hours as needed for cramping.     Marland Kitchen KLOR-CON M20 20 MEQ tablet TAKE 1 TABLET BY MOUTH TWICE A DAY 60 tablet 1  . KLOR-CON M20 20 MEQ tablet TAKE 1 TABLET BY MOUTH TWICE A DAY 60 tablet 1  . KLOR-CON M20 20 MEQ tablet TAKE 1 TABLET BY MOUTH EVERY DAY 30 tablet 1  .  levothyroxine (SYNTHROID, LEVOTHROID) 25 MCG tablet Take 25 mcg by mouth daily before breakfast.     . lidocaine-prilocaine (EMLA) cream Apply to affected area once 30 g 3  . magic mouthwash SOLN Take 5 ml swish and spit three times daily as needed. 100 mL 1  . magic mouthwash w/lidocaine SOLN Take 5 mLs by mouth 3 (three) times daily as needed for mouth pain. 240 mL 0  . metFORMIN (GLUCOPHAGE) 500 MG tablet TAKE 1 TABLET BY MOUTH EVERY DAY WITH BREAKFAST 30 tablet 1  . Omega-3 Fatty Acids (FISH OIL PO) Take 1 capsule by mouth daily.     Marland Kitchen omeprazole (PRILOSEC) 20 MG capsule Take 1 tablet by mouth daily.    . ondansetron (ZOFRAN) 8 MG tablet Take 1 tablet (8 mg total) by mouth 2 (two) times daily as needed for refractory nausea / vomiting. Start on day 3 after chemotherapy. 30 tablet 1  . ONETOUCH DELICA LANCETS 01T MISC USE TO TEST  YOUR BLOOD SUGAR ONCE A DAY FASTING AND 2 HRS AFTER A MEAL AS NEEDED  3  . ONETOUCH VERIO test strip USE TO TEST YOUR BLOOD SUGAR ONCE A DAY FASTING & 2 HRS AFTER A MEAL AS NEEDED  3  . prochlorperazine (COMPAZINE) 10 MG tablet Take 1 tablet (10 mg total) by mouth every 6 (six) hours as needed (NAUSEA). 30 tablet 2  . PROMACTA 50 MG tablet TAKE 1 TABLET BY MOUTH EVERY DAY ON AN EMPTY STOMACH 1 HOUR BEFORE BEFORE A MEAL OR 2 HOURS AFTER A MEAL 30 tablet 0  . ROCKLATAN 0.02-0.005 % SOLN PLACE 1 DROP INTO BOTH EYES AT BEDTIME  12  . traZODone (DESYREL) 100 MG tablet Take 0.5 tablets (50 mg total) by mouth at bedtime. 45 tablet 4   No current facility-administered medications for this visit.     PHYSICAL EXAMINATION: ECOG PERFORMANCE STATUS: 1 - Symptomatic but completely ambulatory Blood pressure 136/93, heart rate 87, respirate 18, temperature 36.9, pulse ox 97% on room air GENERAL:alert, no distress and comfortable SKIN: skin color, texture, turgor are normal, no rashes or significant lesions EYES: normal, Conjunctiva are pink and non-injected, sclera  clear OROPHARYNX:no exudate, no erythema and lips, buccal mucosa, and tongue normal  NECK: supple, thyroid normal size, non-tender, without nodularity LYMPH:  no palpable lymphadenopathy in the cervical, axillary or inguinal LUNGS: clear to auscultation and percussion with normal breathing effort HEART: regular rate & rhythm and no murmurs and no lower extremity edema ABDOMEN:abdomen soft, non-tender and normal bowel sounds Musculoskeletal:no cyanosis of digits and no clubbing  NEURO: alert & oriented x 3 with fluent speech, no focal motor/sensory deficits  LABORATORY DATA:  I have reviewed the data as listed CBC Latest Ref Rng & Units 04/08/2018 03/27/2018 03/18/2018  WBC 4.0 - 10.5 K/uL 7.3 5.4 4.5  Hemoglobin 12.0 - 15.0 g/dL 11.2(L) 11.1(L) 10.2(L)  Hematocrit 36.0 - 46.0 % 33.8(L) 32.9(L) 29.7(L)  Platelets 150 - 400 K/uL 113(L) 133(L) 63(L)     CMP Latest Ref Rng & Units 04/08/2018 03/27/2018 03/18/2018  Glucose 70 - 99 mg/dL 175(H) 205(H) 186(H)  BUN 8 - 23 mg/dL _0 Creatinine 0.44 - 1.00 mg/dL 0.85 0.76 0.76  Sodium 135 - 145 mmol/L 141 139 141  Potassium 3.5 - 5.1 mmol/L 4.2 3.9 4.3  Chloride 98 - 111 mmol/L 106 105 107  CO2 22 - 32 mmol/L _1 Calcium 8.9 - 10.3 mg/dL 9.1 9.5 9.1  Total Protein 6.5 - 8.1 g/dL 6.5 6.6 6.2(L)  Total Bilirubin 0.3 - 1.2 mg/dL 0.7 0.5 0.5  Alkaline Phos 38 - 126 U/L 199(H) 178(H) 193(H)  AST 15 - 41 U/L _2 ALT 0 - 44 U/L _3 RADIOGRAPHIC STUDIES: I have personally reviewed the radiological images as listed and agreed with the findings in the report. No results found.   ASSESSMENT & PLAN:  Carly [name] is a 78 y.o. 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 Unfortunately this is likely unresectable tumor, or at least a borderline resectable disease due to the invasion of SMA. -She has  been on first lineChemo FOLFIRINOXsince 5/23/19with dose reduction due to cytopenia, andPromactafor chemo related thrombocytopenia. Tolerating chemo very well -We discussed her restaging CT AP from 04/02/18 which shows stable disease in the pancrease. No evidence of metastatic disease.  I reviewed the image myself and discussed with patient.  Will continue with current regimen of FOLFIRINOX. -We again discussed the duration of chemo, she will be likely continuous.  If she likes a longer chemo break, will consider consolidation radiation before the break. -She plans to go to [facility] for Surgical consultation to see if surgery is an option for her.  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  -overall controlled  3. Diarrhea, abdominal pain, and weight loss -Currently on Lomotil for diarrhea, Tramadol for moderate pain, and hydrocodone for severe pain. -She has not been in pain and has not required pain medication lately.   4. H/oEndometrialCancer, Stage I, diagnosed in 1981 -Treated with hysterectomy and BSO in 1981. -f/u with OB/GYN and continue annual Pap smears. -Genetics was negative  5.Hypokalemia -Currently on Potassium 23mq daily, will continue -K normal today   6.HTN, Hypothyroidism -f/u with PCP  7.Chemo induced thrombocytopenia -Improved with Promacta and chemo dose reduction.  -Platelets improved to 113K today (04/08/2018) -Continue Promacta 749m I refilled today  8.Goal of care discussion -Patient understand her cancer treatment is palliative, to prolong her life. -She is full code  now  9. 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 -Currently mild and stable    Plan -I refilled Promacta and EMLA cream today  -Labs reviewed, PLT at 113K today. Will proceed with chemo FOLFIRINOX today  -Lab, flush, f/u, and treatment in 2 weeks    No problem-specific Assessment & Plan notes found for this encounter.   No orders of the defined types were placed in this encounter.  All questions were answered. The patient knows to call the clinic with any problems, questions or concerns. No barriers to learning was detected. I spent 20 minutes counseling the patient face to face. The total time spent in the appointment was 25 minutes and more than 50% was on counseling and review of test results     YaTruitt MerleMD 04/08/2018   I, AmJoslyn Devonam acting as scribe for YaTruitt MerleMD.   I have reviewed the above documentation for accuracy and completeness, and I agree with the above.

## 2018-04-08 ENCOUNTER — Inpatient Hospital Stay: Payer: Medicare Other

## 2018-04-08 ENCOUNTER — Telehealth: Payer: Self-pay

## 2018-04-08 ENCOUNTER — Encounter: Payer: Self-pay | Admitting: Hematology

## 2018-04-08 ENCOUNTER — Inpatient Hospital Stay: Payer: Medicare Other | Attending: Hematology | Admitting: Hematology

## 2018-04-08 ENCOUNTER — Telehealth: Payer: Self-pay | Admitting: Hematology

## 2018-04-08 ENCOUNTER — Other Ambulatory Visit: Payer: Self-pay | Admitting: Hematology

## 2018-04-08 VITALS — BP 136/93 | HR 87 | Temp 98.4°F | Resp 18

## 2018-04-08 DIAGNOSIS — Z5111 Encounter for antineoplastic chemotherapy: Secondary | ICD-10-CM | POA: Diagnosis not present

## 2018-04-08 DIAGNOSIS — E876 Hypokalemia: Secondary | ICD-10-CM | POA: Insufficient documentation

## 2018-04-08 DIAGNOSIS — E039 Hypothyroidism, unspecified: Secondary | ICD-10-CM | POA: Diagnosis not present

## 2018-04-08 DIAGNOSIS — Z7189 Other specified counseling: Secondary | ICD-10-CM

## 2018-04-08 DIAGNOSIS — C250 Malignant neoplasm of head of pancreas: Secondary | ICD-10-CM

## 2018-04-08 DIAGNOSIS — I10 Essential (primary) hypertension: Secondary | ICD-10-CM | POA: Insufficient documentation

## 2018-04-08 DIAGNOSIS — D6959 Other secondary thrombocytopenia: Secondary | ICD-10-CM | POA: Insufficient documentation

## 2018-04-08 DIAGNOSIS — Z5189 Encounter for other specified aftercare: Secondary | ICD-10-CM | POA: Insufficient documentation

## 2018-04-08 DIAGNOSIS — Z8542 Personal history of malignant neoplasm of other parts of uterus: Secondary | ICD-10-CM | POA: Diagnosis not present

## 2018-04-08 DIAGNOSIS — Z95828 Presence of other vascular implants and grafts: Secondary | ICD-10-CM

## 2018-04-08 DIAGNOSIS — Z452 Encounter for adjustment and management of vascular access device: Secondary | ICD-10-CM | POA: Insufficient documentation

## 2018-04-08 LAB — CMP (CANCER CENTER ONLY)
ALT: 14 U/L (ref 0–44)
AST: 18 U/L (ref 15–41)
Albumin: 3.6 g/dL (ref 3.5–5.0)
Alkaline Phosphatase: 199 U/L — ABNORMAL HIGH (ref 38–126)
Anion gap: 10 (ref 5–15)
BUN: 10 mg/dL (ref 8–23)
CO2: 25 mmol/L (ref 22–32)
Calcium: 9.1 mg/dL (ref 8.9–10.3)
Chloride: 106 mmol/L (ref 98–111)
Creatinine: 0.85 mg/dL (ref 0.44–1.00)
GFR, Estimated: >60 mL/min (ref >60)
Glucose, Bld: 175 mg/dL — ABNORMAL HIGH (ref 70–99)
Potassium: 4.2 mmol/L (ref 3.5–5.1)
Sodium: 141 mmol/L (ref 135–145)
Total Bilirubin: 0.7 mg/dL (ref 0.3–1.2)
Total Protein: 6.5 g/dL (ref 6.5–8.1)

## 2018-04-08 LAB — CBC WITH DIFFERENTIAL (CANCER CENTER ONLY)
Abs Immature Granulocytes: 0.09 10*3/uL — ABNORMAL HIGH (ref 0.00–0.07)
BASOS PCT: 0 %
Basophils Absolute: 0 10*3/uL (ref 0.0–0.1)
Eosinophils Absolute: 0 10*3/uL (ref 0.0–0.5)
Eosinophils Relative: 1 %
HCT: 33.8 % — ABNORMAL LOW (ref 36.0–46.0)
Hemoglobin: 11.2 g/dL — ABNORMAL LOW (ref 12.0–15.0)
IMMATURE GRANULOCYTES: 1 %
Lymphocytes Relative: 11 %
Lymphs Abs: 0.8 10*3/uL (ref 0.7–4.0)
MCH: 33.8 pg (ref 26.0–34.0)
MCHC: 33.1 g/dL (ref 30.0–36.0)
MCV: 102.1 fL — ABNORMAL HIGH (ref 80.0–100.0)
Monocytes Absolute: 0.6 10*3/uL (ref 0.1–1.0)
Monocytes Relative: 8 %
NEUTROS PCT: 79 %
NRBC: 0 % (ref 0.0–0.2)
Neutro Abs: 5.7 10*3/uL (ref 1.7–7.7)
Platelet Count: 113 10*3/uL — ABNORMAL LOW (ref 150–400)
RBC: 3.31 MIL/uL — ABNORMAL LOW (ref 3.87–5.11)
RDW: 13.8 % (ref 11.5–15.5)
WBC Count: 7.3 10*3/uL (ref 4.0–10.5)

## 2018-04-08 MED ORDER — DEXAMETHASONE SODIUM PHOSPHATE 10 MG/ML IJ SOLN
INTRAMUSCULAR | Status: AC
  Filled 2018-04-08: qty 1

## 2018-04-08 MED ORDER — ELTROMBOPAG OLAMINE 75 MG PO TABS: 75.0000 mg | ORAL_TABLET | Freq: Every day | ORAL | 2 refills | Status: DC

## 2018-04-08 MED ORDER — ATROPINE SULFATE 0.4 MG/ML IJ SOLN
0.4000 mg | Freq: Once | INTRAMUSCULAR | Status: AC | PRN
  Administered 2018-04-08: 0.4 mg via INTRAVENOUS

## 2018-04-08 MED ORDER — DEXTROSE 5 % IV SOLN
Freq: Once | INTRAVENOUS | Status: AC
  Administered 2018-04-08: 11:00:00 via INTRAVENOUS
  Filled 2018-04-08: qty 250

## 2018-04-08 MED ORDER — SODIUM CHLORIDE 0.9 % IV SOLN
400.0000 mg/m2 | Freq: Once | INTRAVENOUS | Status: AC
  Administered 2018-04-08: 680 mg via INTRAVENOUS
  Filled 2018-04-08: qty 34

## 2018-04-08 MED ORDER — OXALIPLATIN CHEMO INJECTION 100 MG/20ML
50.0000 mg/m2 | Freq: Once | INTRAVENOUS | Status: AC
  Administered 2018-04-08: 85 mg via INTRAVENOUS
  Filled 2018-04-08: qty 17

## 2018-04-08 MED ORDER — PALONOSETRON HCL INJECTION 0.25 MG/5ML
INTRAVENOUS | Status: AC
  Filled 2018-04-08: qty 5

## 2018-04-08 MED ORDER — PALONOSETRON HCL INJECTION 0.25 MG/5ML
0.2500 mg | Freq: Once | INTRAVENOUS | Status: AC
  Administered 2018-04-08: 0.25 mg via INTRAVENOUS

## 2018-04-08 MED ORDER — DEXAMETHASONE SODIUM PHOSPHATE 10 MG/ML IJ SOLN
10.0000 mg | Freq: Once | INTRAMUSCULAR | Status: AC
  Administered 2018-04-08: 10 mg via INTRAVENOUS

## 2018-04-08 MED ORDER — SODIUM CHLORIDE 0.9 % IV SOLN
100.0000 mg/m2 | Freq: Once | INTRAVENOUS | Status: AC
  Administered 2018-04-08: 180 mg via INTRAVENOUS
  Filled 2018-04-08: qty 9

## 2018-04-08 MED ORDER — ATROPINE SULFATE 1 MG/ML IJ SOLN
INTRAMUSCULAR | Status: AC
  Filled 2018-04-08: qty 1

## 2018-04-08 MED ORDER — ATROPINE SULFATE 0.4 MG/ML IJ SOLN
INTRAMUSCULAR | Status: AC
  Filled 2018-04-08: qty 1

## 2018-04-08 MED ORDER — SODIUM CHLORIDE 0.9% FLUSH
10.0000 mL | INTRAVENOUS | Status: DC | PRN
  Administered 2018-04-08: 10 mL
  Filled 2018-04-08: qty 10

## 2018-04-08 MED ORDER — SODIUM CHLORIDE 0.9 % IV SOLN
2000.0000 mg/m2 | INTRAVENOUS | Status: DC
  Administered 2018-04-08: 3400 mg via INTRAVENOUS
  Filled 2018-04-08: qty 68

## 2018-04-08 MED ORDER — LIDOCAINE-PRILOCAINE 2.5-2.5 % EX CREA: TOPICAL_CREAM | CUTANEOUS | 3 refills | Status: DC

## 2018-04-08 MED ORDER — ATROPINE SULFATE 1 MG/ML IJ SOLN: 0.5000 mg | Freq: Once | INTRAMUSCULAR | Status: DC | PRN

## 2018-04-08 NOTE — Patient Instructions (Signed)
[facility] Discharge Instructions for Patients Receiving Chemotherapy  Today you received the following chemotherapy agents Oxaliplatin, irinotecan, leucovorin, and 5 FU  To help prevent nausea and vomiting after your treatment, we encourage you to take your nausea medication as directed   If you develop nausea and vomiting that is not controlled by your nausea medication, call the clinic.   BELOW ARE SYMPTOMS THAT SHOULD BE REPORTED IMMEDIATELY:  *FEVER GREATER THAN 100.5 F  *CHILLS WITH OR WITHOUT FEVER  NAUSEA AND VOMITING THAT IS NOT CONTROLLED WITH YOUR NAUSEA MEDICATION  *UNUSUAL SHORTNESS OF BREATH  *UNUSUAL BRUISING OR BLEEDING  TENDERNESS IN MOUTH AND THROAT WITH OR WITHOUT PRESENCE OF ULCERS  *URINARY PROBLEMS  *BOWEL PROBLEMS  UNUSUAL RASH Items with * indicate a potential emergency and should be followed up as soon as possible.  Feel free to call the clinic should you have any questions or concerns. The clinic phone number is [phone].  Please show the [facility] at check-in to the Emergency Department and triage nurse.

## 2018-04-08 NOTE — Telephone Encounter (Signed)
Scheduled appt per 02/10 los. ° °Printed calendar and avs. °

## 2018-04-08 NOTE — Telephone Encounter (Signed)
Spoke with Tedra Coupe in [place] around 11:20 regarding copying CT from 2/4 to disk.  She will have it ready at the front desk.

## 2018-04-09 LAB — CANCER ANTIGEN 19-9: CA 19-9: 19 U/mL (ref 0–35)

## 2018-04-10 ENCOUNTER — Inpatient Hospital Stay: Payer: Medicare Other

## 2018-04-10 VITALS — BP 128/68 | HR 72 | Temp 98.4°F | Resp 17

## 2018-04-10 DIAGNOSIS — Z5189 Encounter for other specified aftercare: Secondary | ICD-10-CM | POA: Diagnosis not present

## 2018-04-10 DIAGNOSIS — Z452 Encounter for adjustment and management of vascular access device: Secondary | ICD-10-CM | POA: Diagnosis not present

## 2018-04-10 DIAGNOSIS — Z7189 Other specified counseling: Secondary | ICD-10-CM

## 2018-04-10 DIAGNOSIS — D6959 Other secondary thrombocytopenia: Secondary | ICD-10-CM | POA: Diagnosis not present

## 2018-04-10 DIAGNOSIS — C250 Malignant neoplasm of head of pancreas: Secondary | ICD-10-CM | POA: Diagnosis not present

## 2018-04-10 DIAGNOSIS — E876 Hypokalemia: Secondary | ICD-10-CM | POA: Diagnosis not present

## 2018-04-10 DIAGNOSIS — Z5111 Encounter for antineoplastic chemotherapy: Secondary | ICD-10-CM | POA: Diagnosis not present

## 2018-04-10 MED ORDER — PEGFILGRASTIM INJECTION 6 MG/0.6ML ~~LOC~~
6.0000 mg | PREFILLED_SYRINGE | Freq: Once | SUBCUTANEOUS | Status: AC
  Administered 2018-04-10: 6 mg via SUBCUTANEOUS

## 2018-04-10 MED ORDER — SODIUM CHLORIDE 0.9% FLUSH
10.0000 mL | INTRAVENOUS | Status: DC | PRN
  Administered 2018-04-10: 10 mL
  Filled 2018-04-10: qty 10

## 2018-04-10 MED ORDER — PEGFILGRASTIM INJECTION 6 MG/0.6ML ~~LOC~~
PREFILLED_SYRINGE | SUBCUTANEOUS | Status: AC
  Filled 2018-04-10: qty 0.6

## 2018-04-10 MED ORDER — HEPARIN SOD (PORK) LOCK FLUSH 100 UNIT/ML IV SOLN
500.0000 [IU] | Freq: Once | INTRAVENOUS | Status: AC | PRN
  Administered 2018-04-10: 500 [IU]
  Filled 2018-04-10: qty 5

## 2018-04-10 NOTE — Patient Instructions (Signed)
Pegfilgrastim injection What is this medicine? PEGFILGRASTIM (PEG fil gra stim) is a long-acting granulocyte colony-stimulating factor that stimulates the growth of neutrophils, a type of white blood cell important in the body's fight against infection. It is used to reduce the incidence of fever and infection in patients with certain types of cancer who are receiving chemotherapy that affects the bone marrow, and to increase survival after being exposed to high doses of radiation. This medicine may be used for other purposes; ask your health care provider or pharmacist if you have questions. COMMON BRAND NAME(S): Neulasta What should I tell my health care provider before I take this medicine? They need to know if you have any of these conditions: -kidney disease -latex allergy -ongoing radiation therapy -sickle cell disease -skin reactions to acrylic adhesives (On-Body Injector only) -an unusual or allergic reaction to pegfilgrastim, filgrastim, other medicines, foods, dyes, or preservatives -pregnant or trying to get pregnant -breast-feeding How should I use this medicine? This medicine is for injection under the skin. If you get this medicine at home, you will be taught how to prepare and give the pre-filled syringe or how to use the On-body Injector. Refer to the patient Instructions for Use for detailed instructions. Use exactly as directed. Tell your healthcare provider immediately if you suspect that the On-body Injector may not have performed as intended or if you suspect the use of the On-body Injector resulted in a missed or partial dose. It is important that you put your used needles and syringes in a special sharps container. Do not put them in a trash can. If you do not have a sharps container, call your pharmacist or healthcare provider to get one. Talk to your pediatrician regarding the use of this medicine in children. While this drug may be prescribed for selected conditions,  precautions do apply. Overdosage: If you think you have taken too much of this medicine contact a poison control center or emergency room at once. NOTE: This medicine is only for you. Do not share this medicine with others. What if I miss a dose? It is important not to miss your dose. Call your doctor or health care professional if you miss your dose. If you miss a dose due to an On-body Injector failure or leakage, a new dose should be administered as soon as possible using a single prefilled syringe for manual use. What may interact with this medicine? Interactions have not been studied. Give your health care provider a list of all the medicines, herbs, non-prescription drugs, or dietary supplements you use. Also tell them if you smoke, drink alcohol, or use illegal drugs. Some items may interact with your medicine. This list may not describe all possible interactions. Give your health care provider a list of all the medicines, herbs, non-prescription drugs, or dietary supplements you use. Also tell them if you smoke, drink alcohol, or use illegal drugs. Some items may interact with your medicine. What should I watch for while using this medicine? You may need blood work done while you are taking this medicine. If you are going to need a MRI, CT scan, or other procedure, tell your doctor that you are using this medicine (On-Body Injector only). What side effects may I notice from receiving this medicine? Side effects that you should report to your doctor or health care professional as soon as possible: -allergic reactions like skin rash, itching or hives, swelling of the face, lips, or tongue -dizziness -fever -pain, redness, or irritation at site   where injected -pinpoint red spots on the skin -red or dark-brown urine -shortness of breath or breathing problems -stomach or side pain, or pain at the shoulder -swelling -tiredness -trouble passing urine or change in the amount of urine Side  effects that usually do not require medical attention (report to your doctor or health care professional if they continue or are bothersome): -bone pain -muscle pain This list may not describe all possible side effects. Call your doctor for medical advice about side effects. You may report side effects to FDA at 1-800-FDA-1088. Where should I keep my medicine? Keep out of the reach of children. Store pre-filled syringes in a refrigerator between 2 and 8 degrees C (36 and 46 degrees F). Do not freeze. Keep in carton to protect from light. Throw away this medicine if it is left out of the refrigerator for more than 48 hours. Throw away any unused medicine after the expiration date. NOTE: This sheet is a summary. It may not cover all possible information. If you have questions about this medicine, talk to your doctor, pharmacist, or health care provider.  2018 [employer] (2016-02-10 12:58:03)  

## 2018-04-12 ENCOUNTER — Other Ambulatory Visit: Payer: Self-pay | Admitting: Family Medicine

## 2018-04-12 ENCOUNTER — Ambulatory Visit
Admission: RE | Admit: 2018-04-12 | Discharge: 2018-04-12 | Disposition: A | Discharge status: Home / Self Care | Payer: Medicare Other | Source: Ambulatory Visit | Attending: Family Medicine | Admitting: Family Medicine

## 2018-04-12 ENCOUNTER — Telehealth: Payer: Self-pay

## 2018-04-12 DIAGNOSIS — Z1231 Encounter for screening mammogram for malignant neoplasm of breast: Secondary | ICD-10-CM

## 2018-04-12 NOTE — Telephone Encounter (Signed)
Oral Oncology Patient Advocate Encounter  Received notification from [facility] that prior authorization for Promacta is required.  PA submitted by fax to [phone] Status is pending  Oral Oncology Clinic will continue to follow.  [place] Patient [place] Phone [phone] Fax [phone]

## 2018-04-15 ENCOUNTER — Telehealth: Payer: Self-pay | Admitting: *Deleted

## 2018-04-15 ENCOUNTER — Other Ambulatory Visit: Payer: Medicare Other

## 2018-04-15 ENCOUNTER — Ambulatory Visit: Payer: Medicare Other | Admitting: Hematology

## 2018-04-15 ENCOUNTER — Ambulatory Visit: Payer: Medicare Other

## 2018-04-15 NOTE — Telephone Encounter (Signed)
"  Carly [name]. [place] [phone]).  We received several Promacta orders.  Return call with Promacta strength and dose to dispense."

## 2018-04-16 ENCOUNTER — Other Ambulatory Visit: Payer: Self-pay | Admitting: Family Medicine

## 2018-04-16 ENCOUNTER — Other Ambulatory Visit: Payer: Self-pay | Admitting: Hematology

## 2018-04-16 DIAGNOSIS — R928 Other abnormal and inconclusive findings on diagnostic imaging of breast: Secondary | ICD-10-CM

## 2018-04-16 NOTE — Telephone Encounter (Signed)
Called [facility] and provided clarification on dose of Promacta, should be 75 mg, pharmacist stated he had all the information he needs to process this medication.

## 2018-04-16 NOTE — Telephone Encounter (Signed)
Oral Oncology Patient Advocate Encounter  Prior Authorization for Antony Blackbird has been approved.    Effective dates: 03/14/18 through 04/13/19  Oral Oncology Clinic will continue to follow.   [place] Patient [place] Phone [phone] Fax [phone]

## 2018-04-18 DIAGNOSIS — C250 Malignant neoplasm of head of pancreas: Secondary | ICD-10-CM | POA: Diagnosis not present

## 2018-04-19 ENCOUNTER — Ambulatory Visit: Payer: Medicare Other

## 2018-04-19 ENCOUNTER — Ambulatory Visit
Admission: RE | Admit: 2018-04-19 | Discharge: 2018-04-19 | Disposition: A | Discharge status: Home / Self Care | Payer: Medicare Other | Source: Ambulatory Visit | Attending: Family Medicine | Admitting: Family Medicine

## 2018-04-19 DIAGNOSIS — R922 Inconclusive mammogram: Secondary | ICD-10-CM | POA: Diagnosis not present

## 2018-04-19 DIAGNOSIS — R928 Other abnormal and inconclusive findings on diagnostic imaging of breast: Secondary | ICD-10-CM

## 2018-04-19 NOTE — Progress Notes (Signed)
Carly [name]   Telephone:([phone]) [phone] [phone]) [phone]   Clinic Follow up Note   Patient Care Team: [name], MD as PCP - General (Family Medicine) Arta Silence, MD as Consulting Physician (Gastroenterology) Truitt Merle, MD as Consulting Physician (Hematology)  Date of Service:  04/22/2018  CHIEF COMPLAINT: F/u of pancreatic cancer  SUMMARY OF ONCOLOGIC HISTORY: Oncology History   Cancer Staging Pancreatic cancer [facility]) Staging form: Exocrine Pancreas, AJCC 8th Edition - Clinical stage from 06/13/2017: Stage III (cT4, cN0, cM0) - Signed by Truitt Merle, MD on 06/13/2017       Pancreatic cancer ([place])   06/08/2017 Imaging    06/08/2017 CT Abdomen IMPRESSION: Irregular solid hypoechoic mass within the pancreatic head, 2.6 x 2.2 cm with associated pancreatic ductal dilatation and pancreatic atrophy, most compatible with pancreatic cancer. 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  Diffuse colonic diverticulosis. Slight stranding around the distal descending colon may reflect early active diverticulitis.  Bilateral renal parapelvic cysts.  Aortic atherosclerosis.    06/13/2017 Initial Diagnosis    Pancreatic cancer ([place])    06/13/2017 Cancer Staging    Staging form: Exocrine Pancreas, AJCC 8th Edition - Clinical stage from 06/13/2017: Stage III (cT4, cN0, cM0) - Signed by Truitt Merle, MD on 06/13/2017    06/13/2017 Procedure    EUS by Dr. Paulita Fujita 06/13/17  IMPRESSION:  -There was no sign of significant pathology in the ampulla. - There was no evidence of significant pathology in the left lobe of the liver. - A mass was identified in the pancreatic head. Abutment SMV; invasion portal confluence; no adenopathy. Fine needle aspiration performed. If this is a pancreatic adenocarcinoma, it would be staged T4/N0/Mx by EUS.    06/13/2017 Initial Biopsy    Diagnosis  06/13/17 FINE NEEDLE ASPIRATION, ENDOSCOPIC, PANCREAS HEAD (SPECIMEN 1 OF 1 COLLECTED 06/13/17): ADENOCARCINOMA. Preliminary Diagnosis Intraoperative Diagnosis: 1-3) ATYPICAL CELLS, ADDITIONAL MATERIAL REQUESTED. (NDK) 4-6 ADENOCARCINOMA. (NDK) Reported to Dr. Paulita Fujita on 06/13/17 @ 11:07am.     06/21/2017 Imaging    CT Chest without contrast IMPRESSION: 1. No findings suspicious for metastatic disease within the chest.  2. Tiny subpleural right lower lobe pulmonary nodule is nonspecific, but likely benign. This can be addressed on follow-up imaging.  3. Aortic Atherosclerosis (ICD10-I70.0).    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  The Hereditary Gene Panel offered by [facility] includes sequencing and/or deletion duplication testing of the following 47 genes: APC, ATM, AXIN2, BARD1, BMPR1A, BRCA1, BRCA2, BRIP1, CDH1, CDK4, CDKN2A (p14ARF), CDKN2A (p16INK4a), CHEK2, CTNNA1, DICER1, EPCAM (Deletion/duplication testing only), GREM1 (promoter region deletion/duplication testing only), KIT, MEN1, MLH1, MSH2, MSH3, MSH6, MUTYH, NBN, NF1, NHTL1, PALB2, PDGFRA, PMS2, POLD1, POLE, PTEN, RAD50, RAD51C, RAD51D, SDHB, SDHC, SDHD, SMAD4, SMARCA4. STK11, TP53, TSC1, TSC2, and VHL.  The following genes were evaluated for sequence changes only: SDHA and HOXB13 c.251G>A variant only. The report date is August 14, 2017.    09/24/2017 Progression    09/24/2017 CT CAP Pancreas: The mass involving the head of pancreas measures 2.5 x 3.8 by 3.1 cm. On the previous exam this measured 2.2 x 2.6 by 3.4 cm.    09/24/2017 Imaging    09/24/2017 CT CAP IMPRESSION: 1. There is been interval local progression of disease with increased  size of head of pancreas neoplasm. There is progressive vascular involvement with partial encasement of the celiac trunk and SMA. Continued encasement and  marked narrowing of the portal venous confluence. 2. No evidence for metastatic adenopathy within the abdomen, liver metastasis or pulmonary metastasis. 3. Aortic atherosclerosis and LAD coronary artery atherosclerotic calcifications. Aortic Atherosclerosis (ICD10-I70.0).    01/04/2018 Imaging    01/04/2018 CT CAP IMPRESSION: 1. No local disease progression identified. The pancreatic head mass is ill-defined and appears slightly smaller. There is chronic partial encasement of the celiac trunk and SMA and chronic short segment occlusion at the SMV/portal vein confluence. 2. No definite evidence of metastatic disease. Stable small hypervascular lesion inferiorly in the right hepatic lobe, likely an incidental vascular lesion. There is slightly greater mesenteric nodularity posterior to the transverse colon, but no other definite signs of peritoneal carcinomatosis. Attention on follow-up recommended. 3. Stable scattered tiny subpleural pulmonary nodules bilaterally, likely benign based on stability, although attention on follow-up recommended. 4. Mild splenomegaly. 5. Aortic Atherosclerosis (ICD10-I70.0). Port-A-Cath tip in the mid right atrium, stable.    04/02/2018 Imaging    CT AP  IMPRESSION: 1. Stable appearance of pancreatic head mass with chronic partial encasement of the celiac trunk and SMA and occlusion of the portal venous confluence. 2. No definite evidence for metastatic disease. Similar appearance of subtle soft tissue nodule posterior to the transverse colon without additional signs of peritoneal carcinomatosis. 3. Mild splenomegaly. 4.  Aortic Atherosclerosis (ICD10-I70.0).       CURRENT THERAPY:  -chemo FOLFIRINOX every 2 weeksstarting 5/23/19with dose reduction due to thrombocytopenia. -Promacta for thrombocytopenia, currently ay 10m daily   INTERVAL HISTORY:  Carly SIBRIANis here for a follow up and treatment. She presents to the clinic today with  her family. She notes she is doing well. [facility] agreed to continue current regimen for as long as possible. Surgery was not considered given they would not be able to get it all.  She notes she is tolerating treatment well still.    REVIEW OF SYSTEMS:   Constitutional: Denies fevers, chills or abnormal weight loss Eyes: Denies blurriness of vision Ears, nose, mouth, throat, and face: Denies mucositis or sore throat Respiratory: Denies cough, dyspnea or wheezes Cardiovascular: Denies palpitation, chest discomfort or lower extremity swelling Gastrointestinal:  Denies nausea, heartburn or change in bowel habits Skin: Denies abnormal skin rashes Lymphatics: Denies new lymphadenopathy or easy bruising Neurological:Denies numbness, tingling or new weaknesses Behavioral/Psych: Mood is stable, no new changes  All other systems were reviewed with the patient and are negative.  MEDICAL HISTORY:  Past Medical History:  Diagnosis Date  . Allergic rhinitis    Skin Test 04-14-2009  . Asthma   . [place]   adenocarcinoma of [place]  . [place]   PANCREATIC   . 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 Family history of breast cancer   . History of uterine cancer   . Hyperlipemia   . Hypertension   . Insomnia     SURGICAL HISTORY: Past Surgical History:  Procedure Laterality Date  . ABDOMINAL HYSTERECTOMY    . CARPAL TUNNEL RELEASE    . EUS N/A 06/13/2017   Procedure: FULL UPPER ENDOSCOPIC ULTRASOUND (EUS) RADIAL;  Surgeon: OArta Silence MD;  Location: [facility];  Service: Endoscopy;  Laterality: N/A;  . I&D EXTREMITY  09/17/2011   Procedure: IRRIGATION AND  DEBRIDEMENT EXTREMITY;  Surgeon: Roseanne Kaufman, MD;  Location: [place];  Service: Orthopedics;  Laterality: Left;  with drain placement  . IR FLUORO GUIDE PORT INSERTION RIGHT  07/18/2017  . IR US GUIDE VASC ACCESS RIGHT  07/18/2017  .  NASAL SEPTOPLASTY W/ TURBINOPLASTY    . pelvic floor rebuild    . TONSILLECTOMY    . TOTAL ABDOMINAL HYSTERECTOMY W/ BILATERAL SALPINGOOPHORECTOMY      I have reviewed the social history and family history with the patient and they are unchanged from previous note.  ALLERGIES:  is allergic to cephalexin and nabumetone.  MEDICATIONS:  Current Outpatient Medications  Medication Sig Dispense Refill  . ALPRAZolam (XANAX) 0.5 MG tablet Take 1 tablet (0.5 mg total) by mouth at bedtime as needed for anxiety. 90 tablet 0  . Ascorbic Acid (VITAMIN C) 500 MG PACK Take 100 mg by mouth daily.    Marland Kitchen atorvastatin (LIPITOR) 10 MG tablet Take 10 mg by mouth at bedtime.     Marland Kitchen CALCIUM PO Take 1 tablet by mouth daily.     . Cholecalciferol (VITAMIN D) 2000 units CAPS Take by mouth.    . Cyanocobalamin (VITAMIN B-12 PO) Take 1 tablet by mouth daily.     . diphenoxylate-atropine (LOMOTIL) 2.5-0.025 MG tablet Take 1-2 tablets by mouth 4 (four) times daily as needed for diarrhea or loose stools. 60 tablet 1  . eltrombopag (PROMACTA) 75 MG tablet Take 1 tablet (75 mg total) by mouth daily. Take on an empty stomach 1 hour before meals or 2 hours after. 30 tablet 2  . hydrochlorothiazide (HYDRODIURIL) 25 MG tablet Take 1 tablet by mouth daily as needed.     Marland Kitchen HYDROcodone-acetaminophen (NORCO) 5-325 MG tablet Take 1 tablet by mouth every 6 (six) hours as needed for moderate pain. 40 tablet 0  . hyoscyamine (LEVBID) 0.375 MG 12 hr tablet Take 0.375 mg by mouth every 12 (twelve) hours as needed for cramping.     Marland Kitchen KLOR-CON M20 20 MEQ tablet TAKE 1 TABLET BY MOUTH TWICE A DAY 60 tablet 1  . KLOR-CON M20 20 MEQ tablet TAKE 1 TABLET BY MOUTH TWICE A DAY 60 tablet 1  . KLOR-CON M20 20 MEQ tablet TAKE 1 TABLET BY MOUTH EVERY DAY 30 tablet 1  . levothyroxine (SYNTHROID, LEVOTHROID) 25 MCG tablet Take 25 mcg by mouth daily before breakfast.     . lidocaine-prilocaine (EMLA) cream Apply to affected area once 30 g 3  . magic  mouthwash SOLN Take 5 ml swish and spit three times daily as needed. 100 mL 1  . magic mouthwash w/lidocaine SOLN Take 5 mLs by mouth 3 (three) times daily as needed for mouth pain. 240 mL 0  . metFORMIN (GLUCOPHAGE) 500 MG tablet TAKE 1 TABLET BY MOUTH EVERY DAY WITH BREAKFAST 30 tablet 1  . Omega-3 Fatty Acids (FISH OIL PO) Take 1 capsule by mouth daily.     Marland Kitchen omeprazole (PRILOSEC) 20 MG capsule Take 1 tablet by mouth daily.    . ondansetron (ZOFRAN) 8 MG tablet Take 1 tablet (8 mg total) by mouth 2 (two) times daily as needed for refractory nausea / vomiting. Start on day 3 after chemotherapy. 30 tablet 1  . ONETOUCH DELICA LANCETS 29J MISC USE TO TEST YOUR BLOOD SUGAR ONCE A DAY FASTING AND 2 HRS AFTER A MEAL AS NEEDED  3  . ONETOUCH VERIO test strip USE TO TEST YOUR BLOOD SUGAR ONCE A DAY FASTING & 2 HRS AFTER A  MEAL AS NEEDED  3  . prochlorperazine (COMPAZINE) 10 MG tablet Take 1 tablet (10 mg total) by mouth every 6 (six) hours as needed (NAUSEA). 30 tablet 2  . PROMACTA 50 MG tablet TAKE 1 TABLET BY MOUTH EVERY DAY ON AN EMPTY STOMACH 1 HOUR BEFORE BEFORE A MEAL OR 2 HOURS AFTER A MEAL 30 tablet 0  . ROCKLATAN 0.02-0.005 % SOLN PLACE 1 DROP INTO BOTH EYES AT BEDTIME  12  . traZODone (DESYREL) 100 MG tablet Take 0.5 tablets (50 mg total) by mouth at bedtime. 45 tablet 4   No current facility-administered medications for this visit.    Facility-Administered Medications Ordered in Other Visits  Medication Dose Route Frequency Provider Last Rate Last Dose  . fluorouracil (ADRUCIL) 3,400 mg in sodium chloride 0.9 % 82 mL chemo infusion  2,000 mg/m2 (Treatment Plan Recorded) Intravenous 1 day or 1 dose Truitt Merle, MD      . irinotecan (CAMPTOSAR) 180 mg in sodium chloride 0.9 % 500 mL chemo infusion  100 mg/m2 (Treatment Plan Recorded) Intravenous Once Truitt Merle, MD 339 mL/hr at 04/22/18 1300 180 mg at 04/22/18 1300  . leucovorin 680 mg in sodium chloride 0.9 % 250 mL infusion  400 mg/m2  (Treatment Plan Recorded) Intravenous Once Truitt Merle, MD 189 mL/hr at 04/22/18 1258 680 mg at 04/22/18 1258    PHYSICAL EXAMINATION: ECOG PERFORMANCE STATUS: 1 - Symptomatic but completely ambulatory  Vitals with BMI 04/22/2018  Height 5'2"  Weight 137lbs  BMI 02.33  Systolic 435  Diastolic 75  Pulse 86  Respirations 18    GENERAL:alert, no distress and comfortable SKIN: skin color, texture, turgor are normal, no rashes or significant lesions EYES: normal, Conjunctiva are pink and non-injected, sclera clear OROPHARYNX:no exudate, no erythema and lips, buccal mucosa, and tongue normal  NECK: supple, thyroid normal size, non-tender, without nodularity LYMPH:  no palpable lymphadenopathy in the cervical, axillary or inguinal LUNGS: clear to auscultation and percussion with normal breathing effort HEART: regular rate & rhythm and no murmurs and no lower extremity edema ABDOMEN:abdomen soft, non-tender and normal bowel sounds Musculoskeletal:no cyanosis of digits and no clubbing  NEURO: alert & oriented x 3 with fluent speech, no focal motor/sensory deficits  LABORATORY DATA:  I have reviewed the data as listed CBC Latest Ref Rng & Units 04/22/2018 04/08/2018 03/27/2018  WBC 4.0 - 10.5 K/uL 7.9 7.3 5.4  Hemoglobin 12.0 - 15.0 g/dL 10.6(L) 11.2(L) 11.1(L)  Hematocrit 36.0 - 46.0 % 32.2(L) 33.8(L) 32.9(L)  Platelets 150 - 400 K/uL 130(L) 113(L) 133(L)     CMP Latest Ref Rng & Units 04/22/2018 04/08/2018 03/27/2018  Glucose 70 - 99 mg/dL 187(H) 175(H) 205(H)  BUN 8 - 23 mg/dL '8 10 13  ' Creatinine 0.44 - 1.00 mg/dL 0.77 0.85 0.76  Sodium 135 - 145 mmol/L 141 141 139  Potassium 3.5 - 5.1 mmol/L 3.9 4.2 3.9  Chloride 98 - 111 mmol/L 105 106 105  CO2 22 - 32 mmol/L '26 25 26  ' Calcium 8.9 - 10.3 mg/dL 9.1 9.1 9.5  Total Protein 6.5 - 8.1 g/dL 6.3(L) 6.5 6.6  Total Bilirubin 0.3 - 1.2 mg/dL 0.5 0.7 0.5  Alkaline Phos 38 - 126 U/L 184(H) 199(H) 178(H)  AST 15 - 41 U/L '18 18 19  ' ALT 0 - 44  U/L '12 14 17      ' RADIOGRAPHIC STUDIES: I have personally reviewed the radiological images as listed and agreed with the findings in the report. No results found.  ASSESSMENT & PLAN:  Carly [name] is a 78 y.o. 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 Unfortunately this is likely unresectable tumor, or at least a borderline resectable disease due to the invasion of SMA. -She has been on first lineChemo FOLFIRINOXsince 5/23/19with dose reduction due to cytopenia, andPromactafor chemo related thrombocytopenia. Tolerating chemo very well -She went to [facility] last week for Second surgical opinion. Dr. Zenia Resides saw her and felt her tumor was not resectable. He encouraged her to continue with current chemo treatment for as long as possible.  -I discussed she can take chemo breaks as needed. If she needs a longer break I recommend RT to continue to help control disease.  -Labs reviewed, Hg at 10.6, PLT at 130, BG at 187, Alk Phos at 184. 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  -BG at 187 today (04/22/18), overall controlled   3. Diarrhea, abdominal pain, and weight loss -Currently on Lomotil for diarrhea, Tramadol for moderate pain, and hydrocodone for severe pain. -Diarrhea controlled, pain is currently resolved and weight is stable.   4. H/oEndometrialCancer, Stage I, diagnosed in 1981 -Treated with hysterectomy and BSO in 1981. -f/u with OB/GYN and continue annual Pap smears. -Genetics was  negative  5.Hypokalemia -Currently on Potassium 49mq daily, will continue -K normal lately  6.HTN, Hypothyroidism -f/u with PCP -Bp at 148/75 today (04/22/18)  7.Chemo induced thrombocytopenia -Improved with Promacta and chemo dose reduction. -Plateletsimproved to 130K today (04/22/2018) -Continue Promacta 7103m 8.Goal of care discussion -Patient understand her cancer treatment is palliative, to prolong her life. -She is full code now  9. 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 -Currently mild and stable   Plan -Labs reviewed and adequate to proceed with chemo FOLFIRINOX today at same dose and continue very 2 weeks  -Lab, flush, chemo in 2 and 4 weeks  -F/u in 4 weeks     No problem-specific Assessment & Plan notes found for this encounter.   No orders of the defined types were placed in this encounter.  All questions were answered. The patient knows to call the clinic with any problems, questions or concerns. No barriers to learning was detected. I spent 20 minutes counseling the patient face to face. The total time spent in the appointment was 25 minutes and more than 50% was on counseling and review of test results     YaTruitt MerleMD 04/22/2018   I, AmJoslyn Devonam acting as scribe for YaTruitt MerleMD.   I have reviewed the above documentation for accuracy and completeness, and I agree with the above.

## 2018-04-22 ENCOUNTER — Inpatient Hospital Stay: Payer: Medicare Other

## 2018-04-22 ENCOUNTER — Telehealth: Payer: Self-pay | Admitting: Hematology

## 2018-04-22 ENCOUNTER — Inpatient Hospital Stay (HOSPITAL_BASED_OUTPATIENT_CLINIC_OR_DEPARTMENT_OTHER): Payer: Medicare Other | Admitting: Hematology

## 2018-04-22 ENCOUNTER — Encounter: Payer: Self-pay | Admitting: Hematology

## 2018-04-22 VITALS — BP 148/75 | HR 86 | Temp 98.3°F | Resp 18

## 2018-04-22 DIAGNOSIS — E039 Hypothyroidism, unspecified: Secondary | ICD-10-CM

## 2018-04-22 DIAGNOSIS — C250 Malignant neoplasm of head of pancreas: Secondary | ICD-10-CM

## 2018-04-22 DIAGNOSIS — Z452 Encounter for adjustment and management of vascular access device: Secondary | ICD-10-CM | POA: Diagnosis not present

## 2018-04-22 DIAGNOSIS — Z8542 Personal history of malignant neoplasm of other parts of uterus: Secondary | ICD-10-CM | POA: Diagnosis not present

## 2018-04-22 DIAGNOSIS — Z95828 Presence of other vascular implants and grafts: Secondary | ICD-10-CM

## 2018-04-22 DIAGNOSIS — Z5111 Encounter for antineoplastic chemotherapy: Secondary | ICD-10-CM | POA: Diagnosis not present

## 2018-04-22 DIAGNOSIS — D6959 Other secondary thrombocytopenia: Secondary | ICD-10-CM | POA: Diagnosis not present

## 2018-04-22 DIAGNOSIS — Z5189 Encounter for other specified aftercare: Secondary | ICD-10-CM | POA: Diagnosis not present

## 2018-04-22 DIAGNOSIS — E876 Hypokalemia: Secondary | ICD-10-CM

## 2018-04-22 DIAGNOSIS — I10 Essential (primary) hypertension: Secondary | ICD-10-CM

## 2018-04-22 DIAGNOSIS — Z7189 Other specified counseling: Secondary | ICD-10-CM

## 2018-04-22 LAB — CMP (CANCER CENTER ONLY)
ALT: 12 U/L (ref 0–44)
AST: 18 U/L (ref 15–41)
Albumin: 3.5 g/dL (ref 3.5–5.0)
Alkaline Phosphatase: 184 U/L — ABNORMAL HIGH (ref 38–126)
Anion gap: 10 (ref 5–15)
BUN: 8 mg/dL (ref 8–23)
CHLORIDE: 105 mmol/L (ref 98–111)
CO2: 26 mmol/L (ref 22–32)
Calcium: 9.1 mg/dL (ref 8.9–10.3)
Creatinine: 0.77 mg/dL (ref 0.44–1.00)
GFR, Est AFR Am: >60 mL/min (ref >60)
Glucose, Bld: 187 mg/dL — ABNORMAL HIGH (ref 70–99)
Potassium: 3.9 mmol/L (ref 3.5–5.1)
Sodium: 141 mmol/L (ref 135–145)
Total Bilirubin: 0.5 mg/dL (ref 0.3–1.2)
Total Protein: 6.3 g/dL — ABNORMAL LOW (ref 6.5–8.1)

## 2018-04-22 LAB — CBC WITH DIFFERENTIAL (CANCER CENTER ONLY)
Abs Immature Granulocytes: 0.11 10*3/uL — ABNORMAL HIGH (ref 0.00–0.07)
Basophils Absolute: 0 10*3/uL (ref 0.0–0.1)
Basophils Relative: 0 %
Eosinophils Absolute: 0.1 10*3/uL (ref 0.0–0.5)
Eosinophils Relative: 1 %
HCT: 32.2 % — ABNORMAL LOW (ref 36.0–46.0)
Hemoglobin: 10.6 g/dL — ABNORMAL LOW (ref 12.0–15.0)
Immature Granulocytes: 1 %
Lymphocytes Relative: 8 %
Lymphs Abs: 0.7 10*3/uL (ref 0.7–4.0)
MCH: 33.4 pg (ref 26.0–34.0)
MCHC: 32.9 g/dL (ref 30.0–36.0)
MCV: 101.6 fL — ABNORMAL HIGH (ref 80.0–100.0)
Monocytes Absolute: 0.8 10*3/uL (ref 0.1–1.0)
Monocytes Relative: 10 %
Neutro Abs: 6.3 10*3/uL (ref 1.7–7.7)
Neutrophils Relative %: 80 %
Platelet Count: 130 10*3/uL — ABNORMAL LOW (ref 150–400)
RBC: 3.17 MIL/uL — ABNORMAL LOW (ref 3.87–5.11)
RDW: 13.9 % (ref 11.5–15.5)
WBC Count: 7.9 10*3/uL (ref 4.0–10.5)
nRBC: 0 % (ref 0.0–0.2)

## 2018-04-22 MED ORDER — DEXAMETHASONE SODIUM PHOSPHATE 10 MG/ML IJ SOLN
INTRAMUSCULAR | Status: AC
  Filled 2018-04-22: qty 1

## 2018-04-22 MED ORDER — OXALIPLATIN CHEMO INJECTION 100 MG/20ML
50.0000 mg/m2 | Freq: Once | INTRAVENOUS | Status: AC
  Administered 2018-04-22: 85 mg via INTRAVENOUS
  Filled 2018-04-22: qty 17

## 2018-04-22 MED ORDER — SODIUM CHLORIDE 0.9 % IV SOLN
400.0000 mg/m2 | Freq: Once | INTRAVENOUS | Status: AC
  Administered 2018-04-22: 680 mg via INTRAVENOUS
  Filled 2018-04-22: qty 34

## 2018-04-22 MED ORDER — ATROPINE SULFATE 1 MG/ML IJ SOLN
INTRAMUSCULAR | Status: AC
  Filled 2018-04-22: qty 1

## 2018-04-22 MED ORDER — PALONOSETRON HCL INJECTION 0.25 MG/5ML
0.2500 mg | Freq: Once | INTRAVENOUS | Status: AC
  Administered 2018-04-22: 0.25 mg via INTRAVENOUS

## 2018-04-22 MED ORDER — DEXAMETHASONE SODIUM PHOSPHATE 10 MG/ML IJ SOLN
10.0000 mg | Freq: Once | INTRAMUSCULAR | Status: AC
  Administered 2018-04-22: 10 mg via INTRAVENOUS

## 2018-04-22 MED ORDER — SODIUM CHLORIDE 0.9 % IV SOLN
100.0000 mg/m2 | Freq: Once | INTRAVENOUS | Status: AC
  Administered 2018-04-22: 180 mg via INTRAVENOUS
  Filled 2018-04-22: qty 9

## 2018-04-22 MED ORDER — ATROPINE SULFATE 1 MG/ML IJ SOLN
0.5000 mg | Freq: Once | INTRAMUSCULAR | Status: AC | PRN
  Administered 2018-04-22: 0.5 mg via INTRAVENOUS

## 2018-04-22 MED ORDER — DEXTROSE 5 % IV SOLN
Freq: Once | INTRAVENOUS | Status: AC
  Administered 2018-04-22: 10:00:00 via INTRAVENOUS
  Filled 2018-04-22: qty 250

## 2018-04-22 MED ORDER — SODIUM CHLORIDE 0.9% FLUSH
10.0000 mL | INTRAVENOUS | Status: DC | PRN
  Administered 2018-04-22: 10 mL
  Filled 2018-04-22: qty 10

## 2018-04-22 MED ORDER — SODIUM CHLORIDE 0.9 % IV SOLN
2000.0000 mg/m2 | INTRAVENOUS | Status: DC
  Administered 2018-04-22: 3400 mg via INTRAVENOUS
  Filled 2018-04-22: qty 68

## 2018-04-22 MED ORDER — PALONOSETRON HCL INJECTION 0.25 MG/5ML
INTRAVENOUS | Status: AC
  Filled 2018-04-22: qty 5

## 2018-04-22 NOTE — Patient Instructions (Signed)
[facility] Discharge Instructions for Patients Receiving Chemotherapy  Today you received the following chemotherapy agents Oxaliplatin, Leucovorin, Irinotecan, and 5FU  To help prevent nausea and vomiting after your treatment, we encourage you to take your nausea medication as directed   If you develop nausea and vomiting that is not controlled by your nausea medication, call the clinic.   BELOW ARE SYMPTOMS THAT SHOULD BE REPORTED IMMEDIATELY:  *FEVER GREATER THAN 100.5 F  *CHILLS WITH OR WITHOUT FEVER  NAUSEA AND VOMITING THAT IS NOT CONTROLLED WITH YOUR NAUSEA MEDICATION  *UNUSUAL SHORTNESS OF BREATH  *UNUSUAL BRUISING OR BLEEDING  TENDERNESS IN MOUTH AND THROAT WITH OR WITHOUT PRESENCE OF ULCERS  *URINARY PROBLEMS  *BOWEL PROBLEMS  UNUSUAL RASH Items with * indicate a potential emergency and should be followed up as soon as possible.  Feel free to call the clinic should you have any questions or concerns. The clinic phone number is [phone].  Please show the CHEMO ALERT CARD at check-in to the Emergency Department and triage nurse.   

## 2018-04-22 NOTE — Telephone Encounter (Signed)
No los per 2/24. 

## 2018-04-23 ENCOUNTER — Other Ambulatory Visit: Payer: Self-pay | Admitting: Hematology

## 2018-04-24 ENCOUNTER — Inpatient Hospital Stay: Payer: Medicare Other

## 2018-04-24 VITALS — BP 128/72 | HR 82 | Temp 98.2°F | Resp 17

## 2018-04-24 DIAGNOSIS — C250 Malignant neoplasm of head of pancreas: Secondary | ICD-10-CM

## 2018-04-24 DIAGNOSIS — D6959 Other secondary thrombocytopenia: Secondary | ICD-10-CM | POA: Diagnosis not present

## 2018-04-24 DIAGNOSIS — Z7189 Other specified counseling: Secondary | ICD-10-CM

## 2018-04-24 DIAGNOSIS — Z5111 Encounter for antineoplastic chemotherapy: Secondary | ICD-10-CM | POA: Diagnosis not present

## 2018-04-24 DIAGNOSIS — Z452 Encounter for adjustment and management of vascular access device: Secondary | ICD-10-CM | POA: Diagnosis not present

## 2018-04-24 DIAGNOSIS — Z5189 Encounter for other specified aftercare: Secondary | ICD-10-CM | POA: Diagnosis not present

## 2018-04-24 DIAGNOSIS — E876 Hypokalemia: Secondary | ICD-10-CM | POA: Diagnosis not present

## 2018-04-24 MED ORDER — PEGFILGRASTIM INJECTION 6 MG/0.6ML ~~LOC~~
PREFILLED_SYRINGE | SUBCUTANEOUS | Status: AC
  Filled 2018-04-24: qty 0.6

## 2018-04-24 MED ORDER — PEGFILGRASTIM INJECTION 6 MG/0.6ML ~~LOC~~
6.0000 mg | PREFILLED_SYRINGE | Freq: Once | SUBCUTANEOUS | Status: AC
  Administered 2018-04-24: 6 mg via SUBCUTANEOUS

## 2018-04-24 MED ORDER — SODIUM CHLORIDE 0.9% FLUSH
10.0000 mL | INTRAVENOUS | Status: DC | PRN
  Administered 2018-04-24: 10 mL
  Filled 2018-04-24: qty 10

## 2018-04-24 MED ORDER — HEPARIN SOD (PORK) LOCK FLUSH 100 UNIT/ML IV SOLN
500.0000 [IU] | Freq: Once | INTRAVENOUS | Status: AC | PRN
  Administered 2018-04-24: 500 [IU]
  Filled 2018-04-24: qty 5

## 2018-04-24 NOTE — Patient Instructions (Signed)
Pegfilgrastim injection What is this medicine? PEGFILGRASTIM (PEG fil gra stim) is a long-acting granulocyte colony-stimulating factor that stimulates the growth of neutrophils, a type of white blood cell important in the body's fight against infection. It is used to reduce the incidence of fever and infection in patients with certain types of cancer who are receiving chemotherapy that affects the bone marrow, and to increase survival after being exposed to high doses of radiation. This medicine may be used for other purposes; ask your health care provider or pharmacist if you have questions. COMMON BRAND NAME(S): Neulasta What should I tell my health care provider before I take this medicine? They need to know if you have any of these conditions: -kidney disease -latex allergy -ongoing radiation therapy -sickle cell disease -skin reactions to acrylic adhesives (On-Body Injector only) -an unusual or allergic reaction to pegfilgrastim, filgrastim, other medicines, foods, dyes, or preservatives -pregnant or trying to get pregnant -breast-feeding How should I use this medicine? This medicine is for injection under the skin. If you get this medicine at home, you will be taught how to prepare and give the pre-filled syringe or how to use the On-body Injector. Refer to the patient Instructions for Use for detailed instructions. Use exactly as directed. Tell your healthcare provider immediately if you suspect that the On-body Injector may not have performed as intended or if you suspect the use of the On-body Injector resulted in a missed or partial dose. It is important that you put your used needles and syringes in a special sharps container. Do not put them in a trash can. If you do not have a sharps container, call your pharmacist or healthcare provider to get one. Talk to your pediatrician regarding the use of this medicine in children. While this drug may be prescribed for selected conditions,  precautions do apply. Overdosage: If you think you have taken too much of this medicine contact a poison control center or emergency room at once. NOTE: This medicine is only for you. Do not share this medicine with others. What if I miss a dose? It is important not to miss your dose. Call your doctor or health care professional if you miss your dose. If you miss a dose due to an On-body Injector failure or leakage, a new dose should be administered as soon as possible using a single prefilled syringe for manual use. What may interact with this medicine? Interactions have not been studied. Give your health care provider a list of all the medicines, herbs, non-prescription drugs, or dietary supplements you use. Also tell them if you smoke, drink alcohol, or use illegal drugs. Some items may interact with your medicine. This list may not describe all possible interactions. Give your health care provider a list of all the medicines, herbs, non-prescription drugs, or dietary supplements you use. Also tell them if you smoke, drink alcohol, or use illegal drugs. Some items may interact with your medicine. What should I watch for while using this medicine? You may need blood work done while you are taking this medicine. If you are going to need a MRI, CT scan, or other procedure, tell your doctor that you are using this medicine (On-Body Injector only). What side effects may I notice from receiving this medicine? Side effects that you should report to your doctor or health care professional as soon as possible: -allergic reactions like skin rash, itching or hives, swelling of the face, lips, or tongue -dizziness -fever -pain, redness, or irritation at site   where injected -pinpoint red spots on the skin -red or dark-brown urine -shortness of breath or breathing problems -stomach or side pain, or pain at the shoulder -swelling -tiredness -trouble passing urine or change in the amount of urine Side  effects that usually do not require medical attention (report to your doctor or health care professional if they continue or are bothersome): -bone pain -muscle pain This list may not describe all possible side effects. Call your doctor for medical advice about side effects. You may report side effects to FDA at 1-800-FDA-1088. Where should I keep my medicine? Keep out of the reach of children. Store pre-filled syringes in a refrigerator between 2 and 8 degrees C (36 and 46 degrees F). Do not freeze. Keep in carton to protect from light. Throw away this medicine if it is left out of the refrigerator for more than 48 hours. Throw away any unused medicine after the expiration date. NOTE: This sheet is a summary. It may not cover all possible information. If you have questions about this medicine, talk to your doctor, pharmacist, or health care provider.  2018 [employer] (2016-02-10 12:58:03)  

## 2018-04-29 ENCOUNTER — Other Ambulatory Visit: Payer: Medicare Other

## 2018-04-29 ENCOUNTER — Ambulatory Visit: Payer: Medicare Other

## 2018-04-29 ENCOUNTER — Ambulatory Visit: Payer: Medicare Other | Admitting: Hematology

## 2018-05-06 ENCOUNTER — Other Ambulatory Visit: Payer: Self-pay

## 2018-05-06 ENCOUNTER — Other Ambulatory Visit: Payer: Self-pay | Admitting: Hematology

## 2018-05-06 ENCOUNTER — Inpatient Hospital Stay: Payer: Medicare Other | Attending: Hematology

## 2018-05-06 ENCOUNTER — Inpatient Hospital Stay: Payer: Medicare Other

## 2018-05-06 VITALS — BP 138/70 | HR 78 | Temp 98.1°F | Resp 18

## 2018-05-06 DIAGNOSIS — I10 Essential (primary) hypertension: Secondary | ICD-10-CM | POA: Diagnosis not present

## 2018-05-06 DIAGNOSIS — Z8542 Personal history of malignant neoplasm of other parts of uterus: Secondary | ICD-10-CM | POA: Diagnosis not present

## 2018-05-06 DIAGNOSIS — C250 Malignant neoplasm of head of pancreas: Secondary | ICD-10-CM | POA: Insufficient documentation

## 2018-05-06 DIAGNOSIS — Z7189 Other specified counseling: Secondary | ICD-10-CM

## 2018-05-06 DIAGNOSIS — Z5111 Encounter for antineoplastic chemotherapy: Secondary | ICD-10-CM | POA: Insufficient documentation

## 2018-05-06 DIAGNOSIS — E876 Hypokalemia: Secondary | ICD-10-CM | POA: Insufficient documentation

## 2018-05-06 DIAGNOSIS — D6959 Other secondary thrombocytopenia: Secondary | ICD-10-CM | POA: Diagnosis not present

## 2018-05-06 DIAGNOSIS — E039 Hypothyroidism, unspecified: Secondary | ICD-10-CM | POA: Diagnosis not present

## 2018-05-06 DIAGNOSIS — Z5189 Encounter for other specified aftercare: Secondary | ICD-10-CM | POA: Diagnosis not present

## 2018-05-06 DIAGNOSIS — Z95828 Presence of other vascular implants and grafts: Secondary | ICD-10-CM

## 2018-05-06 LAB — CBC WITH DIFFERENTIAL (CANCER CENTER ONLY)
Abs Immature Granulocytes: 0.2 10*3/uL — ABNORMAL HIGH (ref 0.00–0.07)
Basophils Absolute: 0 10*3/uL (ref 0.0–0.1)
Basophils Relative: 0 %
Eosinophils Absolute: 0.1 10*3/uL (ref 0.0–0.5)
Eosinophils Relative: 1 %
HCT: 30.6 % — ABNORMAL LOW (ref 36.0–46.0)
Hemoglobin: 10.2 g/dL — ABNORMAL LOW (ref 12.0–15.0)
Immature Granulocytes: 3 %
Lymphocytes Relative: 11 %
Lymphs Abs: 0.7 10*3/uL (ref 0.7–4.0)
MCH: 33.9 pg (ref 26.0–34.0)
MCHC: 33.3 g/dL (ref 30.0–36.0)
MCV: 101.7 fL — ABNORMAL HIGH (ref 80.0–100.0)
MONOS PCT: 11 %
Monocytes Absolute: 0.8 10*3/uL (ref 0.1–1.0)
Neutro Abs: 5.2 10*3/uL (ref 1.7–7.7)
Neutrophils Relative %: 74 %
Platelet Count: 93 10*3/uL — ABNORMAL LOW (ref 150–400)
RBC: 3.01 MIL/uL — ABNORMAL LOW (ref 3.87–5.11)
RDW: 14.6 % (ref 11.5–15.5)
WBC Count: 7 10*3/uL (ref 4.0–10.5)
nRBC: 0 % (ref 0.0–0.2)

## 2018-05-06 LAB — COMPREHENSIVE METABOLIC PANEL
ALT: 17 U/L (ref 0–44)
AST: 22 U/L (ref 15–41)
Albumin: 3.7 g/dL (ref 3.5–5.0)
Alkaline Phosphatase: 186 U/L — ABNORMAL HIGH (ref 38–126)
Anion gap: 8 (ref 5–15)
BUN: 8 mg/dL (ref 8–23)
CO2: 28 mmol/L (ref 22–32)
CREATININE: 0.68 mg/dL (ref 0.44–1.00)
Calcium: 9 mg/dL (ref 8.9–10.3)
Chloride: 105 mmol/L (ref 98–111)
GFR calc Af Amer: >60 mL/min (ref >60)
GFR calc non Af Amer: >60 mL/min (ref >60)
Glucose, Bld: 165 mg/dL — ABNORMAL HIGH (ref 70–99)
Potassium: 3.9 mmol/L (ref 3.5–5.1)
Sodium: 141 mmol/L (ref 135–145)
Total Bilirubin: <0.1 mg/dL — ABNORMAL LOW (ref 0.3–1.2)
Total Protein: 6.5 g/dL (ref 6.5–8.1)

## 2018-05-06 MED ORDER — OXALIPLATIN CHEMO INJECTION 100 MG/20ML
50.0000 mg/m2 | Freq: Once | INTRAVENOUS | Status: AC
  Administered 2018-05-06: 85 mg via INTRAVENOUS
  Filled 2018-05-06: qty 17

## 2018-05-06 MED ORDER — SODIUM CHLORIDE 0.9 % IV SOLN
2000.0000 mg/m2 | INTRAVENOUS | Status: DC
  Administered 2018-05-06: 3400 mg via INTRAVENOUS
  Filled 2018-05-06: qty 68

## 2018-05-06 MED ORDER — DEXTROSE 5 % IV SOLN
Freq: Once | INTRAVENOUS | Status: AC
  Administered 2018-05-06: 11:00:00 via INTRAVENOUS
  Filled 2018-05-06: qty 250

## 2018-05-06 MED ORDER — ATROPINE SULFATE 0.4 MG/ML IJ SOLN
0.4000 mg | Freq: Once | INTRAMUSCULAR | Status: AC | PRN
  Administered 2018-05-06: 0.4 mg via INTRAVENOUS

## 2018-05-06 MED ORDER — SODIUM CHLORIDE 0.9 % IV SOLN
100.0000 mg/m2 | Freq: Once | INTRAVENOUS | Status: AC
  Administered 2018-05-06: 180 mg via INTRAVENOUS
  Filled 2018-05-06: qty 9

## 2018-05-06 MED ORDER — SODIUM CHLORIDE 0.9% FLUSH
10.0000 mL | INTRAVENOUS | Status: DC | PRN
  Administered 2018-05-06: 10 mL
  Filled 2018-05-06: qty 10

## 2018-05-06 MED ORDER — POTASSIUM CHLORIDE CRYS ER 20 MEQ PO TBCR: 20.0000 meq | EXTENDED_RELEASE_TABLET | Freq: Two times a day (BID) | ORAL | 1 refills | Status: DC

## 2018-05-06 MED ORDER — DEXAMETHASONE SODIUM PHOSPHATE 10 MG/ML IJ SOLN
10.0000 mg | Freq: Once | INTRAMUSCULAR | Status: AC
  Administered 2018-05-06: 10 mg via INTRAVENOUS

## 2018-05-06 MED ORDER — PALONOSETRON HCL INJECTION 0.25 MG/5ML
0.2500 mg | Freq: Once | INTRAVENOUS | Status: AC
  Administered 2018-05-06: 0.25 mg via INTRAVENOUS

## 2018-05-06 MED ORDER — DEXAMETHASONE SODIUM PHOSPHATE 10 MG/ML IJ SOLN
INTRAMUSCULAR | Status: AC
  Filled 2018-05-06: qty 1

## 2018-05-06 MED ORDER — ATROPINE SULFATE 0.4 MG/ML IJ SOLN
INTRAMUSCULAR | Status: AC
  Filled 2018-05-06: qty 1

## 2018-05-06 MED ORDER — ATROPINE SULFATE 1 MG/ML IJ SOLN: 0.5000 mg | Freq: Once | INTRAMUSCULAR | Status: DC | PRN

## 2018-05-06 MED ORDER — SODIUM CHLORIDE 0.9 % IV SOLN
400.0000 mg/m2 | Freq: Once | INTRAVENOUS | Status: AC
  Administered 2018-05-06: 680 mg via INTRAVENOUS
  Filled 2018-05-06: qty 34

## 2018-05-06 MED ORDER — PALONOSETRON HCL INJECTION 0.25 MG/5ML
INTRAVENOUS | Status: AC
  Filled 2018-05-06: qty 5

## 2018-05-06 NOTE — Progress Notes (Signed)
Per Dr. Burr Medico okay to treat with Platelets 93, no change in current treatment plan.   Per Dr. Burr Medico will increase Promacta to 100 mg daily, spoke with June at [facility] called in new prescription for dose increased.  They have stopped the shipment of 75 mg tablets and have began processing this change.  They will call the patient when shipment is ready to be sent out.    Spoke with this patient in infusion with above information.

## 2018-05-06 NOTE — Patient Instructions (Signed)
[facility] Discharge Instructions for Patients Receiving Chemotherapy  Today you received the following chemotherapy agents Oxaliplatin, Leucovorin, Irinotecan, and 5FU  To help prevent nausea and vomiting after your treatment, we encourage you to take your nausea medication as directed   If you develop nausea and vomiting that is not controlled by your nausea medication, call the clinic.   BELOW ARE SYMPTOMS THAT SHOULD BE REPORTED IMMEDIATELY:  *FEVER GREATER THAN 100.5 F  *CHILLS WITH OR WITHOUT FEVER  NAUSEA AND VOMITING THAT IS NOT CONTROLLED WITH YOUR NAUSEA MEDICATION  *UNUSUAL SHORTNESS OF BREATH  *UNUSUAL BRUISING OR BLEEDING  TENDERNESS IN MOUTH AND THROAT WITH OR WITHOUT PRESENCE OF ULCERS  *URINARY PROBLEMS  *BOWEL PROBLEMS  UNUSUAL RASH Items with * indicate a potential emergency and should be followed up as soon as possible.  Feel free to call the clinic should you have any questions or concerns. The clinic phone number is [phone].  Please show the CHEMO ALERT CARD at check-in to the Emergency Department and triage nurse.   

## 2018-05-07 ENCOUNTER — Telehealth: Payer: Self-pay | Admitting: *Deleted

## 2018-05-07 LAB — CANCER ANTIGEN 19-9: CA 19-9: 20 U/mL (ref 0–35)

## 2018-05-07 NOTE — Telephone Encounter (Signed)
"  Carly [name] [phone]).  Call me.  This pump keeps beeping high pressure.  It's not to come off until tomorrow."  "Pump started beeping thirty minutes ago when I got in the car, while driving and when I went in a building.  Pump has not been off; I silence it and press start."  Talked through line and bag checks.  Denies crimps or bends.  No further beeping over 8-minutes.  Pump on passenger seat.  Volume to infuse has decreased to 80 ml.   Recommended pillow or fabric under seatbelt.  Relieve pressure to port-a-cath site, tubing and bag of fluids if "High Volume".  "If it beeps again and these things don't work do I just come in?"  Call if problem continues.  Confirmed infusion will be notified of possible need.

## 2018-05-08 ENCOUNTER — Other Ambulatory Visit: Payer: Self-pay

## 2018-05-08 ENCOUNTER — Inpatient Hospital Stay: Payer: Medicare Other

## 2018-05-08 VITALS — BP 139/62 | HR 98 | Temp 98.2°F | Resp 18

## 2018-05-08 DIAGNOSIS — C250 Malignant neoplasm of head of pancreas: Secondary | ICD-10-CM | POA: Diagnosis not present

## 2018-05-08 DIAGNOSIS — Z5111 Encounter for antineoplastic chemotherapy: Secondary | ICD-10-CM | POA: Diagnosis not present

## 2018-05-08 DIAGNOSIS — Z7189 Other specified counseling: Secondary | ICD-10-CM

## 2018-05-08 DIAGNOSIS — D6959 Other secondary thrombocytopenia: Secondary | ICD-10-CM | POA: Diagnosis not present

## 2018-05-08 DIAGNOSIS — I10 Essential (primary) hypertension: Secondary | ICD-10-CM | POA: Diagnosis not present

## 2018-05-08 DIAGNOSIS — Z8542 Personal history of malignant neoplasm of other parts of uterus: Secondary | ICD-10-CM | POA: Diagnosis not present

## 2018-05-08 DIAGNOSIS — E876 Hypokalemia: Secondary | ICD-10-CM | POA: Diagnosis not present

## 2018-05-08 MED ORDER — SODIUM CHLORIDE 0.9% FLUSH
10.0000 mL | INTRAVENOUS | Status: DC | PRN
  Administered 2018-05-08: 10 mL
  Filled 2018-05-08: qty 10

## 2018-05-08 MED ORDER — PEGFILGRASTIM INJECTION 6 MG/0.6ML ~~LOC~~
6.0000 mg | PREFILLED_SYRINGE | Freq: Once | SUBCUTANEOUS | Status: AC
  Administered 2018-05-08: 6 mg via SUBCUTANEOUS

## 2018-05-08 MED ORDER — HEPARIN SOD (PORK) LOCK FLUSH 100 UNIT/ML IV SOLN
500.0000 [IU] | Freq: Once | INTRAVENOUS | Status: AC | PRN
  Administered 2018-05-08: 500 [IU]
  Filled 2018-05-08: qty 5

## 2018-05-08 MED ORDER — PEGFILGRASTIM INJECTION 6 MG/0.6ML ~~LOC~~
PREFILLED_SYRINGE | SUBCUTANEOUS | Status: AC
  Filled 2018-05-08: qty 0.6

## 2018-05-17 NOTE — Progress Notes (Signed)
[place]   [phone] [phone]   Clinic Follow up Note   Patient Care Team: Carly Amel, MD as PCP - General (Family Medicine) Carly Silence, MD as Consulting Physician (Gastroenterology) Carly Merle, MD as Consulting Physician (Hematology)  Date of Service:  05/20/2018  CHIEF COMPLAINT: F/u of pancreatic cancer  SUMMARY OF ONCOLOGIC HISTORY: Oncology History   Cancer Staging Pancreatic cancer [facility]) Staging form: Exocrine Pancreas, AJCC 8th Edition - Clinical stage from 06/13/2017: Stage III (cT4, cN0, cM0) - Signed by Carly Merle, MD on 06/13/2017       Pancreatic cancer ([place])   06/08/2017 Imaging    06/08/2017 CT Abdomen IMPRESSION: Irregular solid hypoechoic mass within the pancreatic head, 2.6 x 2.2 cm with associated pancreatic ductal dilatation and pancreatic atrophy, most compatible with pancreatic cancer. 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  Diffuse colonic diverticulosis. Slight stranding around the distal descending colon may reflect early active diverticulitis.  Bilateral renal parapelvic cysts.  Aortic atherosclerosis.    06/13/2017 Initial Diagnosis    Pancreatic cancer ([place])    06/13/2017 Cancer Staging    Staging form: Exocrine Pancreas, AJCC 8th Edition - Clinical stage from 06/13/2017: Stage III (cT4, cN0, cM0) - Signed by Carly Merle, MD on 06/13/2017    06/13/2017 Procedure    EUS by Dr. Paulita [name] 06/13/17  IMPRESSION:  -There was no sign of significant pathology in the ampulla. - There was no evidence of significant pathology in the left lobe of the liver. - A mass was identified in the pancreatic head. Abutment SMV; invasion portal confluence; no adenopathy. Fine needle aspiration performed. If this is a pancreatic adenocarcinoma, it would be staged T4/N0/Mx by EUS.    06/13/2017 Initial Biopsy    Diagnosis 06/13/17  FINE NEEDLE ASPIRATION, ENDOSCOPIC, PANCREAS HEAD (SPECIMEN 1 OF 1 COLLECTED 06/13/17): ADENOCARCINOMA. Preliminary Diagnosis Intraoperative Diagnosis: 1-3) ATYPICAL CELLS, ADDITIONAL MATERIAL REQUESTED. (NDK) 4-6 ADENOCARCINOMA. (NDK) Reported to Dr. Paulita [name] on 06/13/17 @ 11:07am.     06/21/2017 Imaging    CT Chest without contrast IMPRESSION: 1. No findings suspicious for metastatic disease within the chest.  2. Tiny subpleural right lower lobe pulmonary nodule is nonspecific, but likely benign. This can be addressed on follow-up imaging.  3. Aortic Atherosclerosis (ICD10-I70.0).    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  The Hereditary Gene Panel offered by [facility] includes sequencing and/or deletion duplication testing of the following 47 genes: APC, ATM, AXIN2, BARD1, BMPR1A, BRCA1, BRCA2, BRIP1, CDH1, CDK4, CDKN2A (p14ARF), CDKN2A (p16INK4a), CHEK2, CTNNA1, DICER1, EPCAM (Deletion/duplication testing only), GREM1 (promoter region deletion/duplication testing only), KIT, MEN1, MLH1, MSH2, MSH3, MSH6, MUTYH, NBN, NF1, NHTL1, PALB2, PDGFRA, PMS2, POLD1, POLE, PTEN, RAD50, RAD51C, RAD51D, SDHB, SDHC, SDHD, SMAD4, SMARCA4. STK11, TP53, TSC1, TSC2, and VHL.  The following genes were evaluated for sequence changes only: SDHA and HOXB13 c.251G>A variant only. The report date is August 14, 2017.    09/24/2017 Progression    09/24/2017 CT CAP Pancreas: The mass involving the head of pancreas measures 2.5 x 3.8 by 3.1 cm. On the previous exam this measured 2.2 x 2.6 by 3.4 cm.    09/24/2017 Imaging    09/24/2017 CT CAP IMPRESSION: 1. There is been interval local progression of disease with increased  size of head of pancreas neoplasm. There is progressive vascular involvement with partial encasement of the celiac trunk and SMA. Continued encasement and marked  narrowing of the portal venous confluence. 2. No evidence for metastatic adenopathy within the abdomen, liver metastasis or pulmonary metastasis. 3. Aortic atherosclerosis and LAD coronary artery atherosclerotic calcifications. Aortic Atherosclerosis (ICD10-I70.0).    01/04/2018 Imaging    01/04/2018 CT CAP IMPRESSION: 1. No local disease progression identified. The pancreatic head mass is ill-defined and appears slightly smaller. There is chronic partial encasement of the celiac trunk and SMA and chronic short segment occlusion at the SMV/portal vein confluence. 2. No definite evidence of metastatic disease. Stable small hypervascular lesion inferiorly in the right hepatic lobe, likely an incidental vascular lesion. There is slightly greater mesenteric nodularity posterior to the transverse colon, but no other definite signs of peritoneal carcinomatosis. Attention on follow-up recommended. 3. Stable scattered tiny subpleural pulmonary nodules bilaterally, likely benign based on stability, although attention on follow-up recommended. 4. Mild splenomegaly. 5. Aortic Atherosclerosis (ICD10-I70.0). Port-A-Cath tip in the mid right atrium, stable.    04/02/2018 Imaging    CT AP  IMPRESSION: 1. Stable appearance of pancreatic head mass with chronic partial encasement of the celiac trunk and SMA and occlusion of the portal venous confluence. 2. No definite evidence for metastatic disease. Similar appearance of subtle soft tissue nodule posterior to the transverse colon without additional signs of peritoneal carcinomatosis. 3. Mild splenomegaly. 4.  Aortic Atherosclerosis (ICD10-I70.0).       CURRENT THERAPY:  -chemo FOLFIRINOX every 2 weeksstarting 5/23/19with dose reduction due to thrombocytopenia.Further reduced to every 3 weeks starting 05/20/18 due to thrombocytopenia.  -Promacta for thrombocytopenia, currently ay 80m daily. Increased to 1042mdaily on 05/06/18  INTERVAL  HISTORY:  Carly LAURYs here for a follow up of and treatment. She presents to the clinic today by herself. Her daughter-in-law was called in during visit. She notes she is doing well. She notes her main issues is diarrhea. She feels this is related to her diet and is otherwise controlled. She notes her bowel movements are not daily but manageable. She denies nausea or abdominal issues. She denies any neuropathy. She is still taking Promacta 10041mFrom treatment she notes she gets more fatigued.    REVIEW OF SYSTEMS:   Constitutional: Denies fevers, chills or abnormal weight loss (+) Fatigued  Eyes: Denies blurriness of vision Ears, nose, mouth, throat, and face: Denies mucositis or sore throat Respiratory: Denies cough, dyspnea or wheezes Cardiovascular: Denies palpitation, chest discomfort or lower extremity swelling Gastrointestinal:  Denies nausea, heartburn (+) Occasional diarrhea, controlled  Skin: Denies abnormal skin rashes Lymphatics: Denies new lymphadenopathy or easy bruising Neurological:Denies numbness, tingling or new weaknesses Behavioral/Psych: Mood is stable, no new changes  All other systems were reviewed with the patient and are negative.  MEDICAL HISTORY:  Past Medical History:  Diagnosis Date  . Allergic rhinitis    Skin Test 04-14-2009  . Asthma   . Cancer ([id number]   adenocarcinoma of uterus  . Cancer ([id number]   PANCREATIC   . 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 Family history of breast cancer   . History of uterine cancer   . Hyperlipemia   . Hypertension   . Insomnia     SURGICAL HISTORY: Past Surgical History:  Procedure Laterality Date  . ABDOMINAL HYSTERECTOMY    . CARPAL TUNNEL RELEASE    . EUS N/A  06/13/2017   Procedure: FULL UPPER ENDOSCOPIC ULTRASOUND (EUS) RADIAL;  Surgeon: Carly Silence, MD;  Location: [facility];  Service: Endoscopy;  Laterality: N/A;  . I&D EXTREMITY  09/17/2011    Procedure: IRRIGATION AND DEBRIDEMENT EXTREMITY;  Surgeon: Roseanne Kaufman, MD;  Location: [facility];  Service: Orthopedics;  Laterality: Left;  with drain placement  . IR FLUORO GUIDE PORT INSERTION RIGHT  07/18/2017  . IR US GUIDE VASC ACCESS RIGHT  07/18/2017  . NASAL SEPTOPLASTY W/ TURBINOPLASTY    . pelvic floor rebuild    . TONSILLECTOMY    . TOTAL ABDOMINAL HYSTERECTOMY W/ BILATERAL SALPINGOOPHORECTOMY      I have reviewed the social history and family history with the patient and they are unchanged from previous note.  ALLERGIES:  is allergic to cephalexin and nabumetone.  MEDICATIONS:  Current Outpatient Medications  Medication Sig Dispense Refill  . ALPRAZolam (XANAX) 0.5 MG tablet Take 1 tablet (0.5 mg total) by mouth at bedtime as needed for anxiety. 90 tablet 0  . Ascorbic Acid (VITAMIN C) 500 MG PACK Take 100 mg by mouth daily.    Marland Kitchen atorvastatin (LIPITOR) 10 MG tablet Take 10 mg by mouth at bedtime.     Marland Kitchen CALCIUM PO Take 1 tablet by mouth daily.     . Cholecalciferol (VITAMIN D) 2000 units CAPS Take by mouth.    . Cyanocobalamin (VITAMIN B-12 PO) Take 1 tablet by mouth daily.     . diphenoxylate-atropine (LOMOTIL) 2.5-0.025 MG tablet Take 1-2 tablets by mouth 4 (four) times daily as needed for diarrhea or loose stools. 60 tablet 1  . eltrombopag (PROMACTA) 75 MG tablet Take 1 tablet (75 mg total) by mouth daily. Take on an empty stomach 1 hour before meals or 2 hours after. 30 tablet 2  . hydrochlorothiazide (HYDRODIURIL) 25 MG tablet Take 1 tablet by mouth daily as needed.     Marland Kitchen HYDROcodone-acetaminophen (NORCO) 5-325 MG tablet Take 1 tablet by mouth every 6 (six) hours as needed for moderate pain. 40 tablet 0  . hyoscyamine (LEVBID) 0.375 MG 12 hr tablet Take 0.375 mg by mouth every 12 (twelve) hours as needed for cramping.     Marland Kitchen KLOR-CON M20 20 MEQ tablet TAKE 1 TABLET BY MOUTH TWICE A DAY 60 tablet 1  . KLOR-CON M20 20 MEQ tablet TAKE 1 TABLET BY MOUTH EVERY DAY 30 tablet 1  .  levothyroxine (SYNTHROID, LEVOTHROID) 25 MCG tablet Take 25 mcg by mouth daily before breakfast.     . lidocaine-prilocaine (EMLA) cream Apply to affected area once 30 g 3  . magic mouthwash SOLN Take 5 ml swish and spit three times daily as needed. 100 mL 1  . magic mouthwash w/lidocaine SOLN Take 5 mLs by mouth 3 (three) times daily as needed for mouth pain. 240 mL 0  . metFORMIN (GLUCOPHAGE) 500 MG tablet TAKE 1 TABLET BY MOUTH EVERY DAY WITH BREAKFAST 30 tablet 1  . Omega-3 Fatty Acids (FISH OIL PO) Take 1 capsule by mouth daily.     Marland Kitchen omeprazole (PRILOSEC) 20 MG capsule Take 1 tablet by mouth daily.    . ondansetron (ZOFRAN) 8 MG tablet Take 1 tablet (8 mg total) by mouth 2 (two) times daily as needed for refractory nausea / vomiting. Start on day 3 after chemotherapy. 30 tablet 1  . ONETOUCH DELICA LANCETS 62B MISC USE TO TEST YOUR BLOOD SUGAR ONCE A DAY FASTING AND 2 HRS AFTER A MEAL AS NEEDED  3  . ONETOUCH  VERIO test strip USE TO TEST YOUR BLOOD SUGAR ONCE A DAY FASTING & 2 HRS AFTER A MEAL AS NEEDED  3  . potassium chloride SA (KLOR-CON M20) 20 MEQ tablet Take 1 tablet (20 mEq total) by mouth 2 (two) times daily. 60 tablet 1  . prochlorperazine (COMPAZINE) 10 MG tablet Take 1 tablet (10 mg total) by mouth every 6 (six) hours as needed (NAUSEA). 30 tablet 2  . PROMACTA 50 MG tablet TAKE 1 TABLET BY MOUTH EVERY DAY ON AN EMPTY STOMACH 1 HOUR BEFORE BEFORE A MEAL OR 2 HOURS AFTER A MEAL 30 tablet 0  . ROCKLATAN 0.02-0.005 % SOLN PLACE 1 DROP INTO BOTH EYES AT BEDTIME  12  . traZODone (DESYREL) 100 MG tablet Take 0.5 tablets (50 mg total) by mouth at bedtime. 45 tablet 4   No current facility-administered medications for this visit.     PHYSICAL EXAMINATION: ECOG PERFORMANCE STATUS: 1 - Symptomatic but completely ambulatory  Vitals:   05/20/18 1019  BP: 137/73  Pulse: (!) 110  Resp: 17  Temp: 98.9 F (37.2 C)  SpO2: 96%   Filed Weights   05/20/18 1019  Weight: 138 lb 1.6 oz  (62.6 kg)    GENERAL:alert, no distress and comfortable SKIN: skin color, texture, turgor are normal, no rashes or significant lesions EYES: normal, Conjunctiva are pink and non-injected, sclera clear OROPHARYNX:no exudate, no erythema and lips, buccal mucosa, and tongue normal  NECK: supple, thyroid normal size, non-tender, without nodularity LYMPH:  no palpable lymphadenopathy in the cervical, axillary or inguinal LUNGS: clear to auscultation and percussion with normal breathing effort HEART: regular rate & rhythm and no murmurs and no lower extremity edema ABDOMEN:abdomen soft, non-tender and normal bowel sounds Musculoskeletal:no cyanosis of digits and no clubbing  NEURO: alert & oriented x 3 with fluent speech, no focal motor/sensory deficits  LABORATORY DATA:  I have reviewed the data as listed CBC Latest Ref Rng & Units 05/20/2018 05/06/2018 04/22/2018  WBC 4.0 - 10.5 K/uL 9.2 7.0 7.9  Hemoglobin 12.0 - 15.0 g/dL 10.7(L) 10.2(L) 10.6(L)  Hematocrit 36.0 - 46.0 % 31.1(L) 30.6(L) 32.2(L)  Platelets 150 - 400 K/uL 73(L) 93(L) 130(L)     CMP Latest Ref Rng & Units 05/20/2018 05/06/2018 04/22/2018  Glucose 70 - 99 mg/dL 150(H) 165(H) 187(H)  BUN 8 - 23 mg/dL '8 8 8  ' Creatinine 0.44 - 1.00 mg/dL 0.77 0.68 0.77  Sodium 135 - 145 mmol/L 137 141 141  Potassium 3.5 - 5.1 mmol/L 3.9 3.9 3.9  Chloride 98 - 111 mmol/L 103 105 105  CO2 22 - 32 mmol/L '23 28 26  ' Calcium 8.9 - 10.3 mg/dL 8.8(L) 9.0 9.1  Total Protein 6.5 - 8.1 g/dL 6.6 6.5 6.3(L)  Total Bilirubin 0.3 - 1.2 mg/dL 0.9 <0.1(L) 0.5  Alkaline Phos 38 - 126 U/L 235(H) 186(H) 184(H)  AST 15 - 41 U/L 37 22 18  ALT 0 - 44 U/L '24 17 12      ' RADIOGRAPHIC STUDIES: I have personally reviewed the radiological images as listed and agreed with the findings in the report. No results found.   ASSESSMENT & PLAN:  Carly [name] is a 78 y.o. 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 Unfortunately this is likely unresectable tumor, or at least a borderline resectable disease due to the invasion of SMA. -She has been on first lineChemo FOLFIRINOXsince 5/23/19with dose reduction due to cytopenia, andPromactafor chemo related thrombocytopenia. Toleratingchemo verywell -She was previously seen by [facility] Surgeon Dr. Zenia Resides who felt her tumor was not resectable. He encouraged her to continue with current chemo treatment for as long as possible.  -Labs reviewed, CBC and CMP WNL except hg at 10.7, PLT at 73K, BG at 150, Alk Phos at 235. Given worsening thrombocytopenia, will hold treatment today.  -Given prolonged blood count recovery with each treatment, we discussed the options of reducing chemo to every 3 weeks, or stop chemo and proceed with RT consolidation therapy. She opted to reduce chemo to every 3 weeks, plan to start next week.  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 -BG at 150 today (05/20/18)  3. Diarrhea, abdominal pain, and weight loss -Currently on Lomotil for diarrhea, Tramadol for moderate pain, and hydrocodone for severe pain. -Pain resolved, Diarrhea controlled, and weight is stable lately.   4. H/oEndometrialCancer, Stage I, diagnosed in 1981 -Treated with hysterectomy and BSO in 1981. -f/u with OB/GYN and continue annual Pap smears. -Genetics was negative  5.Hypokalemia -Currently on Potassium 81mq daily, will continue -K remains normal lately.   6.HTN, Hypothyroidism -f/u with PCP -BP at 137/73 today (05/20/18)  7.Chemo induced thrombocytopenia -Improved with Promacta 780mand chemo dose  reduction. -Increased Promacta on 05/05/28 due to decreased level at 93K -CBC today shows PLT decreased to 73K. Will hold treatment today (05/20/27) and further reduce chemo to every 3 weeks.  -Continue Promacta at 10044maily.   8.Goal of care discussion -Patient understand her cancer treatment is palliative, to prolong her life. -She is full code now  9. 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 -Currently resolved   Plan -Labs reviewed, PLT at 73K today. Will hold chemo  -Continue Promacta 100m39mily -Lab, flush, Chemo in 1 week  -Lab, flush, f/u and chemo FOLFIRINOX in 4 weeks    No problem-specific Assessment & Plan notes found for this encounter.   No orders of the defined types were placed in this encounter.  All questions were answered. The patient knows to call the clinic with any problems, questions or concerns. No barriers to learning was detected. I spent 20 minutes counseling the patient face to face. The total time spent in the appointment was 25 minutes and more than 50% was on counseling and review of test results     Bannon Giammarco Carly [name] 05/20/2018   I, AmoyJoslyn Devon acting as scribe for Santana Gosdin Carly [name].   I have reviewed the above documentation for accuracy and completeness, and I agree with the above.

## 2018-05-20 ENCOUNTER — Inpatient Hospital Stay: Payer: Medicare Other

## 2018-05-20 ENCOUNTER — Encounter: Payer: Self-pay | Admitting: Hematology

## 2018-05-20 ENCOUNTER — Telehealth: Payer: Self-pay | Admitting: Hematology

## 2018-05-20 ENCOUNTER — Inpatient Hospital Stay (HOSPITAL_BASED_OUTPATIENT_CLINIC_OR_DEPARTMENT_OTHER): Payer: Medicare Other | Admitting: Hematology

## 2018-05-20 ENCOUNTER — Other Ambulatory Visit: Payer: Self-pay

## 2018-05-20 VITALS — BP 137/73 | HR 110 | Temp 98.9°F | Resp 17 | Ht 62.0 in | Wt 138.1 lb

## 2018-05-20 DIAGNOSIS — Z95828 Presence of other vascular implants and grafts: Secondary | ICD-10-CM

## 2018-05-20 DIAGNOSIS — Z8542 Personal history of malignant neoplasm of other parts of uterus: Secondary | ICD-10-CM

## 2018-05-20 DIAGNOSIS — E039 Hypothyroidism, unspecified: Secondary | ICD-10-CM | POA: Diagnosis not present

## 2018-05-20 DIAGNOSIS — E876 Hypokalemia: Secondary | ICD-10-CM | POA: Diagnosis not present

## 2018-05-20 DIAGNOSIS — D6959 Other secondary thrombocytopenia: Secondary | ICD-10-CM | POA: Diagnosis not present

## 2018-05-20 DIAGNOSIS — C250 Malignant neoplasm of head of pancreas: Secondary | ICD-10-CM

## 2018-05-20 DIAGNOSIS — I10 Essential (primary) hypertension: Secondary | ICD-10-CM

## 2018-05-20 DIAGNOSIS — Z5111 Encounter for antineoplastic chemotherapy: Secondary | ICD-10-CM | POA: Diagnosis not present

## 2018-05-20 LAB — CBC WITH DIFFERENTIAL (CANCER CENTER ONLY)
ABS IMMATURE GRANULOCYTES: 0.1 10*3/uL — AB (ref 0.00–0.07)
Band Neutrophils: 6 %
Basophils Absolute: 0 10*3/uL (ref 0.0–0.1)
Basophils Relative: 0 %
Eosinophils Absolute: 0.1 10*3/uL (ref 0.0–0.5)
Eosinophils Relative: 1 %
HCT: 31.1 % — ABNORMAL LOW (ref 36.0–46.0)
Hemoglobin: 10.7 g/dL — ABNORMAL LOW (ref 12.0–15.0)
Lymphocytes Relative: 11 %
Lymphs Abs: 1 10*3/uL (ref 0.7–4.0)
MCH: 34.6 pg — ABNORMAL HIGH (ref 26.0–34.0)
MCHC: 34.4 g/dL (ref 30.0–36.0)
MCV: 100.6 fL — ABNORMAL HIGH (ref 80.0–100.0)
MONOS PCT: 10 %
Metamyelocytes Relative: 1 %
Monocytes Absolute: 0.9 10*3/uL (ref 0.1–1.0)
Neutro Abs: 7.1 10*3/uL (ref 1.7–17.7)
Neutrophils Relative %: 71 %
Platelet Count: 73 10*3/uL — ABNORMAL LOW (ref 150–400)
RBC: 3.09 MIL/uL — AB (ref 3.87–5.11)
RDW: 15.9 % — ABNORMAL HIGH (ref 11.5–15.5)
WBC Count: 9.2 10*3/uL (ref 4.0–10.5)
nRBC: 0 % (ref 0.0–0.2)

## 2018-05-20 LAB — CMP (CANCER CENTER ONLY)
ALT: 24 U/L (ref 0–44)
AST: 37 U/L (ref 15–41)
Albumin: 3.4 g/dL — ABNORMAL LOW (ref 3.5–5.0)
Alkaline Phosphatase: 235 U/L — ABNORMAL HIGH (ref 38–126)
Anion gap: 11 (ref 5–15)
BUN: 8 mg/dL (ref 8–23)
CO2: 23 mmol/L (ref 22–32)
Calcium: 8.8 mg/dL — ABNORMAL LOW (ref 8.9–10.3)
Chloride: 103 mmol/L (ref 98–111)
Creatinine: 0.77 mg/dL (ref 0.44–1.00)
GFR, Est AFR Am: >60 mL/min (ref >60)
GFR, Estimated: >60 mL/min (ref >60)
Glucose, Bld: 150 mg/dL — ABNORMAL HIGH (ref 70–99)
Potassium: 3.9 mmol/L (ref 3.5–5.1)
Sodium: 137 mmol/L (ref 135–145)
Total Bilirubin: 0.9 mg/dL (ref 0.3–1.2)
Total Protein: 6.6 g/dL (ref 6.5–8.1)

## 2018-05-20 MED ORDER — SODIUM CHLORIDE 0.9% FLUSH
10.0000 mL | INTRAVENOUS | Status: DC | PRN
  Administered 2018-05-20: 10 mL
  Filled 2018-05-20: qty 10

## 2018-05-20 MED ORDER — HEPARIN SOD (PORK) LOCK FLUSH 100 UNIT/ML IV SOLN
500.0000 [IU] | Freq: Once | INTRAVENOUS | Status: AC
  Administered 2018-05-20: 500 [IU] via INTRAVENOUS
  Filled 2018-05-20: qty 5

## 2018-05-20 NOTE — Telephone Encounter (Signed)
Called and scheduled appt per 3/23 los.  Added treatment in the book for approval (3/30)  Patient aware of scheduled appts.

## 2018-05-23 ENCOUNTER — Telehealth: Payer: Self-pay | Admitting: Hematology

## 2018-05-23 NOTE — Telephone Encounter (Signed)
Called and informed the patient that her treatment has been added for 3/30.  Patient aware of appt date and time.

## 2018-05-27 ENCOUNTER — Inpatient Hospital Stay: Payer: Medicare Other

## 2018-05-27 ENCOUNTER — Other Ambulatory Visit: Payer: Self-pay | Admitting: Hematology

## 2018-05-27 ENCOUNTER — Other Ambulatory Visit: Payer: Self-pay

## 2018-05-27 VITALS — BP 133/58 | HR 94 | Temp 99.2°F | Resp 17

## 2018-05-27 DIAGNOSIS — E876 Hypokalemia: Secondary | ICD-10-CM | POA: Diagnosis not present

## 2018-05-27 DIAGNOSIS — Z7189 Other specified counseling: Secondary | ICD-10-CM

## 2018-05-27 DIAGNOSIS — C250 Malignant neoplasm of head of pancreas: Secondary | ICD-10-CM | POA: Diagnosis not present

## 2018-05-27 DIAGNOSIS — I10 Essential (primary) hypertension: Secondary | ICD-10-CM | POA: Diagnosis not present

## 2018-05-27 DIAGNOSIS — Z5111 Encounter for antineoplastic chemotherapy: Secondary | ICD-10-CM | POA: Diagnosis not present

## 2018-05-27 DIAGNOSIS — Z8542 Personal history of malignant neoplasm of other parts of uterus: Secondary | ICD-10-CM | POA: Diagnosis not present

## 2018-05-27 DIAGNOSIS — D6959 Other secondary thrombocytopenia: Secondary | ICD-10-CM | POA: Diagnosis not present

## 2018-05-27 LAB — CBC WITH DIFFERENTIAL (CANCER CENTER ONLY)
Abs Immature Granulocytes: 0.04 10*3/uL (ref 0.00–0.07)
Basophils Absolute: 0 10*3/uL (ref 0.0–0.1)
Basophils Relative: 1 %
Eosinophils Absolute: 0 10*3/uL (ref 0.0–0.5)
Eosinophils Relative: 1 %
HCT: 26.8 % — ABNORMAL LOW (ref 36.0–46.0)
Hemoglobin: 8.9 g/dL — ABNORMAL LOW (ref 12.0–15.0)
Immature Granulocytes: 2 %
Lymphocytes Relative: 25 %
Lymphs Abs: 0.7 10*3/uL (ref 0.7–4.0)
MCH: 34.6 pg — ABNORMAL HIGH (ref 26.0–34.0)
MCHC: 33.2 g/dL (ref 30.0–36.0)
MCV: 104.3 fL — AB (ref 80.0–100.0)
Monocytes Absolute: 0.4 10*3/uL (ref 0.1–1.0)
Monocytes Relative: 16 %
Neutro Abs: 1.5 10*3/uL — ABNORMAL LOW (ref 1.7–7.7)
Neutrophils Relative %: 55 %
Platelet Count: 85 10*3/uL — ABNORMAL LOW (ref 150–400)
RBC: 2.57 MIL/uL — ABNORMAL LOW (ref 3.87–5.11)
RDW: 17.2 % — ABNORMAL HIGH (ref 11.5–15.5)
WBC: 2.7 10*3/uL — AB (ref 4.0–10.5)
nRBC: 0 % (ref 0.0–0.2)

## 2018-05-27 LAB — CMP (CANCER CENTER ONLY)
ALK PHOS: 230 U/L — AB (ref 38–126)
ALT: 20 U/L (ref 0–44)
AST: 33 U/L (ref 15–41)
Albumin: 3.2 g/dL — ABNORMAL LOW (ref 3.5–5.0)
Anion gap: 9 (ref 5–15)
BUN: 15 mg/dL (ref 8–23)
CALCIUM: 8.7 mg/dL — AB (ref 8.9–10.3)
CO2: 25 mmol/L (ref 22–32)
Chloride: 105 mmol/L (ref 98–111)
Creatinine: 0.73 mg/dL (ref 0.44–1.00)
GFR, Est AFR Am: >60 mL/min (ref >60)
GFR, Estimated: >60 mL/min (ref >60)
Glucose, Bld: 216 mg/dL — ABNORMAL HIGH (ref 70–99)
Potassium: 4.4 mmol/L (ref 3.5–5.1)
Sodium: 139 mmol/L (ref 135–145)
TOTAL PROTEIN: 6.5 g/dL (ref 6.5–8.1)
Total Bilirubin: 1.3 mg/dL — ABNORMAL HIGH (ref 0.3–1.2)

## 2018-05-27 MED ORDER — PALONOSETRON HCL INJECTION 0.25 MG/5ML
INTRAVENOUS | Status: AC
  Filled 2018-05-27: qty 5

## 2018-05-27 MED ORDER — SODIUM CHLORIDE 0.9 % IV SOLN
100.0000 mg/m2 | Freq: Once | INTRAVENOUS | Status: AC
  Administered 2018-05-27: 180 mg via INTRAVENOUS
  Filled 2018-05-27: qty 9

## 2018-05-27 MED ORDER — PALONOSETRON HCL INJECTION 0.25 MG/5ML
0.2500 mg | Freq: Once | INTRAVENOUS | Status: AC
  Administered 2018-05-27: 0.25 mg via INTRAVENOUS

## 2018-05-27 MED ORDER — DEXTROSE 5 % IV SOLN
Freq: Once | INTRAVENOUS | Status: AC
  Administered 2018-05-27: 10:00:00 via INTRAVENOUS
  Filled 2018-05-27: qty 250

## 2018-05-27 MED ORDER — ATROPINE SULFATE 0.4 MG/ML IJ SOLN
INTRAMUSCULAR | Status: AC
  Filled 2018-05-27: qty 1

## 2018-05-27 MED ORDER — SODIUM CHLORIDE 0.9 % IV SOLN
400.0000 mg/m2 | Freq: Once | INTRAVENOUS | Status: AC
  Administered 2018-05-27: 680 mg via INTRAVENOUS
  Filled 2018-05-27: qty 34

## 2018-05-27 MED ORDER — DEXAMETHASONE SODIUM PHOSPHATE 10 MG/ML IJ SOLN
10.0000 mg | Freq: Once | INTRAMUSCULAR | Status: AC
  Administered 2018-05-27: 10 mg via INTRAVENOUS

## 2018-05-27 MED ORDER — ATROPINE SULFATE 1 MG/ML IJ SOLN
0.5000 mg | Freq: Once | INTRAMUSCULAR | Status: AC | PRN
  Administered 2018-05-27: 0.5 mg via INTRAVENOUS

## 2018-05-27 MED ORDER — SODIUM CHLORIDE 0.9 % IV SOLN
1800.0000 mg/m2 | INTRAVENOUS | Status: DC
  Administered 2018-05-27: 3050 mg via INTRAVENOUS
  Filled 2018-05-27: qty 61

## 2018-05-27 MED ORDER — ATROPINE SULFATE 1 MG/ML IJ SOLN
INTRAMUSCULAR | Status: AC
  Filled 2018-05-27: qty 1

## 2018-05-27 MED ORDER — HEPARIN SOD (PORK) LOCK FLUSH 100 UNIT/ML IV SOLN
500.0000 [IU] | Freq: Once | INTRAVENOUS | Status: DC | PRN
  Filled 2018-05-27: qty 5

## 2018-05-27 MED ORDER — DEXAMETHASONE SODIUM PHOSPHATE 10 MG/ML IJ SOLN
INTRAMUSCULAR | Status: AC
  Filled 2018-05-27: qty 1

## 2018-05-27 MED ORDER — SODIUM CHLORIDE 0.9% FLUSH
10.0000 mL | INTRAVENOUS | Status: DC | PRN
  Filled 2018-05-27: qty 10

## 2018-05-27 MED ORDER — OXALIPLATIN CHEMO INJECTION 100 MG/20ML
40.0000 mg/m2 | Freq: Once | INTRAVENOUS | Status: AC
  Administered 2018-05-27: 70 mg via INTRAVENOUS
  Filled 2018-05-27: qty 14

## 2018-05-27 NOTE — Progress Notes (Signed)
Reviewed pt labs (CBC) with Dr. Burr Medico and pt ok to treat with ANC 1.5 and platelets 85.

## 2018-05-27 NOTE — Patient Instructions (Signed)
[facility] Discharge Instructions for Patients Receiving Chemotherapy  Today you received the following chemotherapy agents Oxaliplatin, Leucovorin, Irinotecan, and 5FU  To help prevent nausea and vomiting after your treatment, we encourage you to take your nausea medication as directed   If you develop nausea and vomiting that is not controlled by your nausea medication, call the clinic.   BELOW ARE SYMPTOMS THAT SHOULD BE REPORTED IMMEDIATELY:  *FEVER GREATER THAN 100.5 F  *CHILLS WITH OR WITHOUT FEVER  NAUSEA AND VOMITING THAT IS NOT CONTROLLED WITH YOUR NAUSEA MEDICATION  *UNUSUAL SHORTNESS OF BREATH  *UNUSUAL BRUISING OR BLEEDING  TENDERNESS IN MOUTH AND THROAT WITH OR WITHOUT PRESENCE OF ULCERS  *URINARY PROBLEMS  *BOWEL PROBLEMS  UNUSUAL RASH Items with * indicate a potential emergency and should be followed up as soon as possible.  Feel free to call the clinic should you have any questions or concerns. The clinic phone number is [phone].  Please show the [place] at check-in to the Emergency Department and triage nurse.

## 2018-05-28 ENCOUNTER — Other Ambulatory Visit: Payer: Self-pay | Admitting: Hematology

## 2018-05-28 DIAGNOSIS — C250 Malignant neoplasm of head of pancreas: Secondary | ICD-10-CM

## 2018-05-29 ENCOUNTER — Inpatient Hospital Stay: Payer: Medicare Other | Attending: Hematology

## 2018-05-29 ENCOUNTER — Other Ambulatory Visit: Payer: Self-pay

## 2018-05-29 VITALS — BP 119/63 | HR 85 | Temp 98.3°F | Resp 18

## 2018-05-29 DIAGNOSIS — E876 Hypokalemia: Secondary | ICD-10-CM | POA: Diagnosis not present

## 2018-05-29 DIAGNOSIS — C250 Malignant neoplasm of head of pancreas: Secondary | ICD-10-CM | POA: Diagnosis not present

## 2018-05-29 DIAGNOSIS — D6959 Other secondary thrombocytopenia: Secondary | ICD-10-CM | POA: Diagnosis not present

## 2018-05-29 DIAGNOSIS — Z79899 Other long term (current) drug therapy: Secondary | ICD-10-CM | POA: Insufficient documentation

## 2018-05-29 DIAGNOSIS — Z5111 Encounter for antineoplastic chemotherapy: Secondary | ICD-10-CM | POA: Insufficient documentation

## 2018-05-29 DIAGNOSIS — Z452 Encounter for adjustment and management of vascular access device: Secondary | ICD-10-CM | POA: Insufficient documentation

## 2018-05-29 DIAGNOSIS — Z5189 Encounter for other specified aftercare: Secondary | ICD-10-CM | POA: Insufficient documentation

## 2018-05-29 DIAGNOSIS — Z7189 Other specified counseling: Secondary | ICD-10-CM

## 2018-05-29 DIAGNOSIS — Z8542 Personal history of malignant neoplasm of other parts of uterus: Secondary | ICD-10-CM | POA: Diagnosis not present

## 2018-05-29 DIAGNOSIS — I10 Essential (primary) hypertension: Secondary | ICD-10-CM | POA: Diagnosis not present

## 2018-05-29 DIAGNOSIS — E039 Hypothyroidism, unspecified: Secondary | ICD-10-CM | POA: Diagnosis not present

## 2018-05-29 MED ORDER — PEGFILGRASTIM INJECTION 6 MG/0.6ML ~~LOC~~
PREFILLED_SYRINGE | SUBCUTANEOUS | Status: AC
  Filled 2018-05-29: qty 0.6

## 2018-05-29 MED ORDER — SODIUM CHLORIDE 0.9% FLUSH
10.0000 mL | INTRAVENOUS | Status: DC | PRN
  Administered 2018-05-29: 10 mL
  Filled 2018-05-29: qty 10

## 2018-05-29 MED ORDER — HEPARIN SOD (PORK) LOCK FLUSH 100 UNIT/ML IV SOLN
500.0000 [IU] | Freq: Once | INTRAVENOUS | Status: AC | PRN
  Administered 2018-05-29: 13:00:00 500 [IU]
  Filled 2018-05-29: qty 5

## 2018-05-29 MED ORDER — PEGFILGRASTIM INJECTION 6 MG/0.6ML ~~LOC~~
6.0000 mg | PREFILLED_SYRINGE | Freq: Once | SUBCUTANEOUS | Status: AC
  Administered 2018-05-29: 13:00:00 6 mg via SUBCUTANEOUS

## 2018-06-02 ENCOUNTER — Other Ambulatory Visit: Payer: Self-pay | Admitting: Hematology

## 2018-06-03 ENCOUNTER — Ambulatory Visit: Payer: Medicare Other

## 2018-06-03 ENCOUNTER — Other Ambulatory Visit: Payer: Medicare Other

## 2018-06-13 ENCOUNTER — Encounter: Payer: Self-pay | Admitting: Pharmacist

## 2018-06-13 NOTE — Progress Notes (Signed)
Carly [name]   [phone] Fax:([phone]   Clinic Follow up Note   Patient Care Team: Carly Amel, MD as PCP - General (Family Medicine) Carly Silence, MD as Consulting Physician (Gastroenterology) Carly Merle, MD as Consulting Physician (Hematology)  Date of Service:  06/17/2018  CHIEF COMPLAINT: F/u of pancreatic cancer  SUMMARY OF ONCOLOGIC HISTORY: Oncology History   Cancer Staging Pancreatic cancer [facility]) Staging form: Exocrine Pancreas, AJCC 8th Edition - Clinical stage from 06/13/2017: Stage III (cT4, cN0, cM0) - Signed by Carly Merle, MD on 06/13/2017       Pancreatic cancer ([place])   06/08/2017 Imaging    06/08/2017 CT Abdomen IMPRESSION: Irregular solid hypoechoic mass within the pancreatic head, 2.6 x 2.2 cm with associated pancreatic ductal dilatation and pancreatic atrophy, most compatible with pancreatic cancer. 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  Diffuse colonic diverticulosis. Slight stranding around the distal descending colon may reflect early active diverticulitis.  Bilateral renal parapelvic cysts.  Aortic atherosclerosis.    06/13/2017 Initial Diagnosis    Pancreatic cancer ([place])    06/13/2017 Cancer Staging    Staging form: Exocrine Pancreas, AJCC 8th Edition - Clinical stage from 06/13/2017: Stage III (cT4, cN0, cM0) - Signed by Carly Merle, MD on 06/13/2017    06/13/2017 Procedure    EUS by Dr. Paulita [name] 06/13/17  IMPRESSION:  -There was no sign of significant pathology in the ampulla. - There was no evidence of significant pathology in the left lobe of the liver. - A mass was identified in the pancreatic head. Abutment SMV; invasion portal confluence; no adenopathy. Fine needle aspiration performed. If this is a pancreatic adenocarcinoma, it would be staged T4/N0/Mx by EUS.    06/13/2017 Initial Biopsy    Diagnosis 06/13/17  FINE NEEDLE ASPIRATION, ENDOSCOPIC, PANCREAS HEAD (SPECIMEN 1 OF 1 COLLECTED 06/13/17): ADENOCARCINOMA. Preliminary Diagnosis Intraoperative Diagnosis: 1-3) ATYPICAL CELLS, ADDITIONAL MATERIAL REQUESTED. (NDK) 4-6 ADENOCARCINOMA. (NDK) Reported to Dr. Paulita [name] on 06/13/17 @ 11:07am.     06/21/2017 Imaging    CT Chest without contrast IMPRESSION: 1. No findings suspicious for metastatic disease within the chest.  2. Tiny subpleural right lower lobe pulmonary nodule is nonspecific, but likely benign. This can be addressed on follow-up imaging.  3. Aortic Atherosclerosis (ICD10-I70.0).    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  The Hereditary Gene Panel offered by [facility] includes sequencing and/or deletion duplication testing of the following 47 genes: APC, ATM, AXIN2, BARD1, BMPR1A, BRCA1, BRCA2, BRIP1, CDH1, CDK4, CDKN2A (p14ARF), CDKN2A (p16INK4a), CHEK2, CTNNA1, DICER1, EPCAM (Deletion/duplication testing only), GREM1 (promoter region deletion/duplication testing only), KIT, MEN1, MLH1, MSH2, MSH3, MSH6, MUTYH, NBN, NF1, NHTL1, PALB2, PDGFRA, PMS2, POLD1, POLE, PTEN, RAD50, RAD51C, RAD51D, SDHB, SDHC, SDHD, SMAD4, SMARCA4. STK11, TP53, TSC1, TSC2, and VHL.  The following genes were evaluated for sequence changes only: SDHA and HOXB13 c.251G>A variant only. The report date is August 14, 2017.    09/24/2017 Progression    09/24/2017 CT CAP Pancreas: The mass involving the head of pancreas measures 2.5 x 3.8 by 3.1 cm. On the previous exam this measured 2.2 x 2.6 by 3.4 cm.    09/24/2017 Imaging    09/24/2017 CT CAP IMPRESSION: 1. There is been interval local progression of disease with increased  size of head of pancreas neoplasm. There is progressive vascular involvement with partial encasement of the celiac trunk and SMA. Continued encasement and marked  narrowing of the portal venous confluence. 2. No evidence for metastatic adenopathy within the abdomen, liver metastasis or pulmonary metastasis. 3. Aortic atherosclerosis and LAD coronary artery atherosclerotic calcifications. Aortic Atherosclerosis (ICD10-I70.0).    01/04/2018 Imaging    01/04/2018 CT CAP IMPRESSION: 1. No local disease progression identified. The pancreatic head mass is ill-defined and appears slightly smaller. There is chronic partial encasement of the celiac trunk and SMA and chronic short segment occlusion at the SMV/portal vein confluence. 2. No definite evidence of metastatic disease. Stable small hypervascular lesion inferiorly in the right hepatic lobe, likely an incidental vascular lesion. There is slightly greater mesenteric nodularity posterior to the transverse colon, but no other definite signs of peritoneal carcinomatosis. Attention on follow-up recommended. 3. Stable scattered tiny subpleural pulmonary nodules bilaterally, likely benign based on stability, although attention on follow-up recommended. 4. Mild splenomegaly. 5. Aortic Atherosclerosis (ICD10-I70.0). Port-A-Cath tip in the mid right atrium, stable.    04/02/2018 Imaging    CT AP  IMPRESSION: 1. Stable appearance of pancreatic head mass with chronic partial encasement of the celiac trunk and SMA and occlusion of the portal venous confluence. 2. No definite evidence for metastatic disease. Similar appearance of subtle soft tissue nodule posterior to the transverse colon without additional signs of peritoneal carcinomatosis. 3. Mild splenomegaly. 4.  Aortic Atherosclerosis (ICD10-I70.0).       CURRENT THERAPY:  -chemo FOLFIRINOX every 2 weeksstarting 5/23/19with dose reduction due to thrombocytopenia.Further reduced to every 3 weeks starting 05/20/18 due to thrombocytopenia.  -Promacta for thrombocytopenia, currently ay 11m daily. Increased to 1040mdaily on 05/06/18  INTERVAL  HISTORY:  Carly VANALSTINEs here for a follow up and treatment. She presents to the clinic today by herself.  She feel she is doing well and tolerating reduced chemo to every 3 weeks. She is able to control her diarrhea. She has not needed to take anti-diarrheals. She notes she will occasionally have left abdominal twinges which comes and goes. This will last 5-6 minutes. She did take miralax to help.  She feels she is eating well and able to gain weight. She notes still being anxious and will take XANAx at night as needed.    REVIEW OF SYSTEMS:   Constitutional: Denies fevers, chills or abnormal weight loss Eyes: Denies blurriness of vision Ears, nose, mouth, throat, and face: Denies mucositis or sore throat Respiratory: Denies cough, dyspnea or wheezes Cardiovascular: Denies palpitation, chest discomfort or lower extremity swelling Gastrointestinal:  Denies nausea, heartburn or change in bowel habits (+) occasional Left abdominal bowel cramps  Skin: Denies abnormal skin rashes Lymphatics: Denies new lymphadenopathy or easy bruising Neurological:Denies numbness, tingling or new weaknesses Behavioral/Psych: Mood is stable, no new changes  All other systems were reviewed with the patient and are negative.  MEDICAL HISTORY:  Past Medical History:  Diagnosis Date  . Allergic rhinitis    Skin Test 04-14-2009  . Asthma   . Cancer ([facility]   adenocarcinoma of uterus  . Cancer [place]   PANCREATIC   . 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 Family history of breast cancer   . History of uterine cancer   . Hyperlipemia   . Hypertension   . Insomnia     SURGICAL HISTORY: Past Surgical History:  Procedure Laterality Date  .  ABDOMINAL HYSTERECTOMY    . CARPAL TUNNEL RELEASE    . EUS N/A 06/13/2017   Procedure: FULL UPPER ENDOSCOPIC ULTRASOUND (EUS) RADIAL;  Surgeon: Carly Silence, MD;  Location: [facility];  Service: Endoscopy;   Laterality: N/A;  . I&D EXTREMITY  09/17/2011   Procedure: IRRIGATION AND DEBRIDEMENT EXTREMITY;  Surgeon: Roseanne Kaufman, MD;  Location: [facility];  Service: Orthopedics;  Laterality: Left;  with drain placement  . IR FLUORO GUIDE PORT INSERTION RIGHT  07/18/2017  . IR US GUIDE VASC ACCESS RIGHT  07/18/2017  . NASAL SEPTOPLASTY W/ TURBINOPLASTY    . pelvic floor rebuild    . TONSILLECTOMY    . TOTAL ABDOMINAL HYSTERECTOMY W/ BILATERAL SALPINGOOPHORECTOMY      I have reviewed the social history and family history with the patient and they are unchanged from previous note.  ALLERGIES:  is allergic to cephalexin and nabumetone.  MEDICATIONS:  Current Outpatient Medications  Medication Sig Dispense Refill  . ALPRAZolam (XANAX) 0.5 MG tablet Take 1 tablet (0.5 mg total) by mouth at bedtime as needed for anxiety. 90 tablet 0  . Ascorbic Acid (VITAMIN C) 500 MG PACK Take 100 mg by mouth daily.    Marland Kitchen atorvastatin (LIPITOR) 10 MG tablet Take 10 mg by mouth at bedtime.     Marland Kitchen CALCIUM PO Take 1 tablet by mouth daily.     . Cholecalciferol (VITAMIN D) 2000 units CAPS Take by mouth.    . Cyanocobalamin (VITAMIN B-12 PO) Take 1 tablet by mouth daily.     . diphenoxylate-atropine (LOMOTIL) 2.5-0.025 MG tablet Take 1-2 tablets by mouth 4 (four) times daily as needed for diarrhea or loose stools. 60 tablet 1  . eltrombopag (PROMACTA) 75 MG tablet Take 1 tablet (75 mg total) by mouth daily. Take on an empty stomach 1 hour before meals or 2 hours after. 30 tablet 2  . hydrochlorothiazide (HYDRODIURIL) 25 MG tablet Take 1 tablet by mouth daily as needed.     Marland Kitchen HYDROcodone-acetaminophen (NORCO) 5-325 MG tablet Take 1 tablet by mouth every 6 (six) hours as needed for moderate pain. 40 tablet 0  . hyoscyamine (LEVBID) 0.375 MG 12 hr tablet Take 0.375 mg by mouth every 12 (twelve) hours as needed for cramping.     Marland Kitchen KLOR-CON M20 20 MEQ tablet TAKE 1 TABLET BY MOUTH TWICE A DAY 60 tablet 1  . KLOR-CON M20 20 MEQ tablet  TAKE 1 TABLET BY MOUTH EVERY DAY 30 tablet 1  . levothyroxine (SYNTHROID, LEVOTHROID) 25 MCG tablet Take 25 mcg by mouth daily before breakfast.     . lidocaine-prilocaine (EMLA) cream Apply to affected area once 30 g 3  . magic mouthwash SOLN Take 5 ml swish and spit three times daily as needed. 100 mL 1  . magic mouthwash w/lidocaine SOLN Take 5 mLs by mouth 3 (three) times daily as needed for mouth pain. 240 mL 0  . metFORMIN (GLUCOPHAGE) 500 MG tablet TAKE 1 TABLET BY MOUTH EVERY DAY WITH BREAKFAST 30 tablet 1  . Omega-3 Fatty Acids (FISH OIL PO) Take 1 capsule by mouth daily.     Marland Kitchen omeprazole (PRILOSEC) 20 MG capsule Take 1 tablet by mouth daily.    . ondansetron (ZOFRAN) 8 MG tablet Take 1 tablet (8 mg total) by mouth 2 (two) times daily as needed for refractory nausea / vomiting. Start on day 3 after chemotherapy. 30 tablet 1  . ONETOUCH DELICA LANCETS 52D MISC USE TO TEST YOUR BLOOD SUGAR ONCE A  DAY FASTING AND 2 HRS AFTER A MEAL AS NEEDED  3  . ONETOUCH VERIO test strip USE TO TEST YOUR BLOOD SUGAR ONCE A DAY FASTING & 2 HRS AFTER A MEAL AS NEEDED  3  . potassium chloride SA (KLOR-CON M20) 20 MEQ tablet Take 1 tablet (20 mEq total) by mouth 2 (two) times daily. 60 tablet 1  . prochlorperazine (COMPAZINE) 10 MG tablet Take 1 tablet (10 mg total) by mouth every 6 (six) hours as needed (NAUSEA). 30 tablet 2  . PROMACTA 50 MG tablet TAKE 2 TABLETS (100 MG) BY MOUTH EVERY DAY ON AN EMPTY STOMACH, 1 HOUR BEFORE OR 2 HOURS AFTER A MEAL 60 tablet 0  . ROCKLATAN 0.02-0.005 % SOLN PLACE 1 DROP INTO BOTH EYES AT BEDTIME  12  . traZODone (DESYREL) 100 MG tablet Take 0.5 tablets (50 mg total) by mouth at bedtime. 45 tablet 4   No current facility-administered medications for this visit.    Facility-Administered Medications Ordered in Other Visits  Medication Dose Route Frequency Provider Last Rate Last Dose  . 0.9 %  sodium chloride infusion   Intravenous Once Carly Merle, MD      . atropine  injection 0.5 mg  0.5 mg Intravenous Once PRN Carly Merle, MD      . fluorouracil (ADRUCIL) 3,050 mg in sodium chloride 0.9 % 89 mL chemo infusion  1,800 mg/m2 (Treatment Plan Recorded) Intravenous 1 day or 1 dose Carly Merle, MD      . irinotecan (CAMPTOSAR) 180 mg in sodium chloride 0.9 % 500 mL chemo infusion  100 mg/m2 (Treatment Plan Recorded) Intravenous Once Carly Merle, MD      . leucovorin 680 mg in sodium chloride 0.9 % 250 mL infusion  400 mg/m2 (Treatment Plan Recorded) Intravenous Once Carly Merle, MD      . oxaliplatin (ELOXATIN) 70 mg in dextrose 5 % 500 mL chemo infusion  40 mg/m2 (Treatment Plan Recorded) Intravenous Once Carly Merle, MD 257 mL/hr at 06/17/18 1055 70 mg at 06/17/18 1055    PHYSICAL EXAMINATION: ECOG PERFORMANCE STATUS: 1 - Symptomatic but completely ambulatory  Vitals:   06/17/18 0856  BP: (!) 143/68  Pulse: 89  Resp: 18  Temp: 99 F (37.2 C)  SpO2: 98%   Filed Weights   06/17/18 0856  Weight: 140 lb 6.4 oz (63.7 kg)    GENERAL:alert, no distress and comfortable SKIN: skin color, texture, turgor are normal, no rashes or significant lesions EYES: normal, Conjunctiva are pink and non-injected, sclera clear OROPHARYNX:no exudate, no erythema and lips, buccal mucosa, and tongue normal  NECK: supple, thyroid normal size, non-tender, without nodularity LYMPH:  no palpable lymphadenopathy in the cervical, axillary or inguinal LUNGS: clear to auscultation and percussion with normal breathing effort HEART: regular rate & rhythm and no murmurs and no lower extremity edema ABDOMEN:abdomen soft, non-tender (+) Low bowel sounds Musculoskeletal:no cyanosis of digits and no clubbing  NEURO: alert & oriented x 3 with fluent speech, no focal motor/sensory deficits  LABORATORY DATA:  I have reviewed the data as listed CBC Latest Ref Rng & Units 06/17/2018 05/27/2018 05/20/2018  WBC 4.0 - 10.5 K/uL 5.8 2.7(L) 9.2  Hemoglobin 12.0 - 15.0 g/dL 9.6(L) 8.9(L) 10.7(L)   Hematocrit 36.0 - 46.0 % 28.2(L) 26.8(L) 31.1(L)  Platelets 150 - 400 K/uL 121(L) 85(L) 73(L)     CMP Latest Ref Rng & Units 06/17/2018 05/27/2018 05/20/2018  Glucose 70 - 99 mg/dL 183(H) 216(H) 150(H)  BUN 8 - 23 mg/dL  '11 15 8  ' Creatinine 0.44 - 1.00 mg/dL 0.68 0.73 0.77  Sodium 135 - 145 mmol/L 139 139 137  Potassium 3.5 - 5.1 mmol/L 4.3 4.4 3.9  Chloride 98 - 111 mmol/L 105 105 103  CO2 22 - 32 mmol/L '25 25 23  ' Calcium 8.9 - 10.3 mg/dL 9.2 8.7(L) 8.8(L)  Total Protein 6.5 - 8.1 g/dL 6.6 6.5 6.6  Total Bilirubin 0.3 - 1.2 mg/dL 0.5 1.3(H) 0.9  Alkaline Phos 38 - 126 U/L 225(H) 230(H) 235(H)  AST 15 - 41 U/L 35 33 37  ALT 0 - 44 U/L '28 20 24      ' RADIOGRAPHIC STUDIES: I have personally reviewed the radiological images as listed and agreed with the findings in the report. No results found.   ASSESSMENT & PLAN:  TORIA MONTE is a 78 y.o. 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 Unfortunately this is likely unresectable tumor, or at least a borderline resectable disease due to the invasion of SMA. -She has been on first lineChemo FOLFIRINOXsince 5/23/19with dose reduction due to cytopenia, andPromactafor chemo related thrombocytopenia. Toleratingchemo verywell. Reduced to every 3 weeks given prolonged blood count recovery.  -She was previously seen by [facility] Surgeon Dr. Zenia Resides who felt her tumor was not resectable.He encouraged her to continue with current chemo treatment for as long as possible.  -She is tolerating FOLFIRINOX every 3 weeks well with controlled diarrhea and improved thrombocytopenia.  -Lab  reviewed, CBC WNL except Hg 9.6, PLT 121K, CMP unremarkable except elevated BG and alkaline phos. Ca 19.9 is still pending. Overall adequate to proceed with FOLFIRINOX today.  -Plan to do next CT scan in 07/2018 given COVID-19. She is agreeable.  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 -BG at 183 today (06/17/18)  3. Diarrhea, abdominal pain, and weight loss -Currently on Lomotil for diarrhea, Tramadol for moderate pain, and hydrocodone for severe pain. -Pain resolved, Diarrhea controlled with chemo reduction.  -She has been having occasional bowel cramps and bowel sounds were low today on exam (06/17/18).I encouraged her to take Miralax when she gets home and continue as needed to avoid constipation.   4. H/oEndometrialCancer, Stage I, diagnosed in 1981 -Treated with hysterectomy and BSO in 1981. -f/u with OB/GYN and continue annual Pap smears. -Genetics was negative  5.Hypokalemia -Currently on Potassium 33mq daily, will continue -K remains normal lately.   6.HTN, Hypothyroidism -f/u with PCP -BP at 143/68 today (06/17/18)  7.Chemo induced thrombocytopenia -Improved with Promacta 772mand chemo dose reduction. -Increased Promacta on 05/05/28 due to decreased level and reduced chemo to every 3 weeks starting 05/20/18.  -CBC today shows PLT improved to 121K (06/17/18).   -Continue Promacta at 10042maily.   8.Goal of care discussion -Patient understand her cancer treatment is palliative, to prolong her life. -She is full code now  9. 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   -If she develops pain or tingling I will recommend Gabapentin for management. -Currently resolved   Plan -I refilled her Xanax today  -Labs reviewed and adequate to proceed with same dose  reduced FOLFIRINOX today and continue every 3 weeks  -Lab, flush, f/u and chemo FOLFIRINOX in 3 and 6  weeks  -Continue Promacta 164m daily   No problem-specific Assessment & Plan notes found for this encounter.   No orders of the defined types were placed in this encounter.  All questions were answered. The patient knows to call the clinic with any problems, questions or concerns. No barriers to learning was detected. I spent 20 minutes counseling the patient face to face. The total time spent in the appointment was 25 minutes and more than 50% was on counseling and review of test results     YTruitt Merle MD 06/17/2018   I, AJoslyn Devon am acting as scribe for YTruitt Merle MD.   I have reviewed the above documentation for accuracy and completeness, and I agree with the above.

## 2018-06-17 ENCOUNTER — Inpatient Hospital Stay: Payer: Medicare Other

## 2018-06-17 ENCOUNTER — Other Ambulatory Visit: Payer: Self-pay

## 2018-06-17 ENCOUNTER — Encounter: Payer: Self-pay | Admitting: Hematology

## 2018-06-17 ENCOUNTER — Inpatient Hospital Stay (HOSPITAL_BASED_OUTPATIENT_CLINIC_OR_DEPARTMENT_OTHER): Payer: Medicare Other | Admitting: Hematology

## 2018-06-17 ENCOUNTER — Telehealth: Payer: Self-pay | Admitting: Hematology

## 2018-06-17 VITALS — BP 143/68 | HR 89 | Temp 99.0°F | Resp 18 | Ht 62.0 in | Wt 140.4 lb

## 2018-06-17 DIAGNOSIS — Z5189 Encounter for other specified aftercare: Secondary | ICD-10-CM | POA: Diagnosis not present

## 2018-06-17 DIAGNOSIS — Z8542 Personal history of malignant neoplasm of other parts of uterus: Secondary | ICD-10-CM | POA: Diagnosis not present

## 2018-06-17 DIAGNOSIS — C250 Malignant neoplasm of head of pancreas: Secondary | ICD-10-CM

## 2018-06-17 DIAGNOSIS — E876 Hypokalemia: Secondary | ICD-10-CM | POA: Diagnosis not present

## 2018-06-17 DIAGNOSIS — Z95828 Presence of other vascular implants and grafts: Secondary | ICD-10-CM

## 2018-06-17 DIAGNOSIS — Z7189 Other specified counseling: Secondary | ICD-10-CM

## 2018-06-17 DIAGNOSIS — Z5111 Encounter for antineoplastic chemotherapy: Secondary | ICD-10-CM | POA: Diagnosis not present

## 2018-06-17 DIAGNOSIS — E039 Hypothyroidism, unspecified: Secondary | ICD-10-CM | POA: Diagnosis not present

## 2018-06-17 DIAGNOSIS — I10 Essential (primary) hypertension: Secondary | ICD-10-CM | POA: Diagnosis not present

## 2018-06-17 DIAGNOSIS — D6959 Other secondary thrombocytopenia: Secondary | ICD-10-CM | POA: Diagnosis not present

## 2018-06-17 DIAGNOSIS — M85852 Other specified disorders of bone density and structure, left thigh: Secondary | ICD-10-CM

## 2018-06-17 DIAGNOSIS — M85851 Other specified disorders of bone density and structure, right thigh: Secondary | ICD-10-CM

## 2018-06-17 DIAGNOSIS — Z452 Encounter for adjustment and management of vascular access device: Secondary | ICD-10-CM | POA: Diagnosis not present

## 2018-06-17 LAB — CMP (CANCER CENTER ONLY)
ALT: 28 U/L (ref 0–44)
AST: 35 U/L (ref 15–41)
Albumin: 3.3 g/dL — ABNORMAL LOW (ref 3.5–5.0)
Alkaline Phosphatase: 225 U/L — ABNORMAL HIGH (ref 38–126)
Anion gap: 9 (ref 5–15)
BUN: 11 mg/dL (ref 8–23)
CO2: 25 mmol/L (ref 22–32)
Calcium: 9.2 mg/dL (ref 8.9–10.3)
Chloride: 105 mmol/L (ref 98–111)
Creatinine: 0.68 mg/dL (ref 0.44–1.00)
GFR, Est AFR Am: >60 mL/min (ref >60)
GFR, Estimated: >60 mL/min (ref >60)
Glucose, Bld: 183 mg/dL — ABNORMAL HIGH (ref 70–99)
Potassium: 4.3 mmol/L (ref 3.5–5.1)
Sodium: 139 mmol/L (ref 135–145)
Total Bilirubin: 0.5 mg/dL (ref 0.3–1.2)
Total Protein: 6.6 g/dL (ref 6.5–8.1)

## 2018-06-17 LAB — CBC WITH DIFFERENTIAL (CANCER CENTER ONLY)
Abs Immature Granulocytes: 0.03 10*3/uL (ref 0.00–0.07)
Basophils Absolute: 0 10*3/uL (ref 0.0–0.1)
Basophils Relative: 0 %
Eosinophils Absolute: 0.1 10*3/uL (ref 0.0–0.5)
Eosinophils Relative: 1 %
HCT: 28.2 % — ABNORMAL LOW (ref 36.0–46.0)
Hemoglobin: 9.6 g/dL — ABNORMAL LOW (ref 12.0–15.0)
Immature Granulocytes: 1 %
Lymphocytes Relative: 11 %
Lymphs Abs: 0.7 10*3/uL (ref 0.7–4.0)
MCH: 35.8 pg — ABNORMAL HIGH (ref 26.0–34.0)
MCHC: 34 g/dL (ref 30.0–36.0)
MCV: 105.2 fL — ABNORMAL HIGH (ref 80.0–100.0)
Monocytes Absolute: 0.7 10*3/uL (ref 0.1–1.0)
Monocytes Relative: 12 %
Neutro Abs: 4.3 10*3/uL (ref 1.7–7.7)
Neutrophils Relative %: 75 %
Platelet Count: 121 10*3/uL — ABNORMAL LOW (ref 150–400)
RBC: 2.68 MIL/uL — ABNORMAL LOW (ref 3.87–5.11)
RDW: 17.5 % — ABNORMAL HIGH (ref 11.5–15.5)
WBC Count: 5.8 10*3/uL (ref 4.0–10.5)
nRBC: 0 % (ref 0.0–0.2)

## 2018-06-17 MED ORDER — ATROPINE SULFATE 1 MG/ML IJ SOLN
0.5000 mg | Freq: Once | INTRAMUSCULAR | Status: AC | PRN
  Administered 2018-06-17: 0.5 mg via INTRAVENOUS

## 2018-06-17 MED ORDER — ALPRAZOLAM 0.5 MG PO TABS: 0.5000 mg | ORAL_TABLET | Freq: Every evening | ORAL | 0 refills | Status: AC | PRN

## 2018-06-17 MED ORDER — SODIUM CHLORIDE 0.9 % IV SOLN
1800.0000 mg/m2 | INTRAVENOUS | Status: DC
  Administered 2018-06-17: 3050 mg via INTRAVENOUS
  Filled 2018-06-17: qty 61

## 2018-06-17 MED ORDER — OXALIPLATIN CHEMO INJECTION 100 MG/20ML
40.0000 mg/m2 | Freq: Once | INTRAVENOUS | Status: AC
  Administered 2018-06-17: 11:00:00 70 mg via INTRAVENOUS
  Filled 2018-06-17: qty 14

## 2018-06-17 MED ORDER — DEXTROSE 5 % IV SOLN
Freq: Once | INTRAVENOUS | Status: AC
  Administered 2018-06-17: 10:00:00 via INTRAVENOUS
  Filled 2018-06-17: qty 250

## 2018-06-17 MED ORDER — SODIUM CHLORIDE 0.9 % IV SOLN
100.0000 mg/m2 | Freq: Once | INTRAVENOUS | Status: AC
  Administered 2018-06-17: 13:00:00 180 mg via INTRAVENOUS
  Filled 2018-06-17: qty 9

## 2018-06-17 MED ORDER — DEXAMETHASONE SODIUM PHOSPHATE 10 MG/ML IJ SOLN
INTRAMUSCULAR | Status: AC
  Filled 2018-06-17: qty 1

## 2018-06-17 MED ORDER — PALONOSETRON HCL INJECTION 0.25 MG/5ML
0.2500 mg | Freq: Once | INTRAVENOUS | Status: AC
  Administered 2018-06-17: 10:00:00 0.25 mg via INTRAVENOUS

## 2018-06-17 MED ORDER — DEXAMETHASONE SODIUM PHOSPHATE 10 MG/ML IJ SOLN
10.0000 mg | Freq: Once | INTRAMUSCULAR | Status: AC
  Administered 2018-06-17: 10 mg via INTRAVENOUS

## 2018-06-17 MED ORDER — SODIUM CHLORIDE 0.9 % IV SOLN
Freq: Once | INTRAVENOUS | Status: AC
  Administered 2018-06-17: 10:00:00 via INTRAVENOUS
  Filled 2018-06-17: qty 250

## 2018-06-17 MED ORDER — SODIUM CHLORIDE 0.9 % IV SOLN
Freq: Once | INTRAVENOUS | Status: AC
  Administered 2018-06-17: 13:00:00 via INTRAVENOUS
  Filled 2018-06-17: qty 250

## 2018-06-17 MED ORDER — SODIUM CHLORIDE 0.9% FLUSH
10.0000 mL | INTRAVENOUS | Status: DC | PRN
  Administered 2018-06-17: 09:00:00 10 mL
  Filled 2018-06-17: qty 10

## 2018-06-17 MED ORDER — SODIUM CHLORIDE 0.9 % IV SOLN
400.0000 mg/m2 | Freq: Once | INTRAVENOUS | Status: AC
  Administered 2018-06-17: 680 mg via INTRAVENOUS
  Filled 2018-06-17: qty 34

## 2018-06-17 MED ORDER — PALONOSETRON HCL INJECTION 0.25 MG/5ML
INTRAVENOUS | Status: AC
  Filled 2018-06-17: qty 5

## 2018-06-17 MED ORDER — ATROPINE SULFATE 1 MG/ML IJ SOLN
INTRAMUSCULAR | Status: AC
  Filled 2018-06-17: qty 1

## 2018-06-17 NOTE — Patient Instructions (Signed)
[facility] Discharge Instructions for Patients Receiving Chemotherapy  Today you received the following chemotherapy agents :  Oxaliplatin,  Irinotecan, Leucovorin, Fluorouracil.  To help prevent nausea and vomiting after your treatment, we encourage you to take your nausea medication as prescribed.   If you develop nausea and vomiting that is not controlled by your nausea medication, call the clinic.   BELOW ARE SYMPTOMS THAT SHOULD BE REPORTED IMMEDIATELY:  *FEVER GREATER THAN 100.5 F  *CHILLS WITH OR WITHOUT FEVER  NAUSEA AND VOMITING THAT IS NOT CONTROLLED WITH YOUR NAUSEA MEDICATION  *UNUSUAL SHORTNESS OF BREATH  *UNUSUAL BRUISING OR BLEEDING  TENDERNESS IN MOUTH AND THROAT WITH OR WITHOUT PRESENCE OF ULCERS  *URINARY PROBLEMS  *BOWEL PROBLEMS  UNUSUAL RASH Items with * indicate a potential emergency and should be followed up as soon as possible.  Feel free to call the clinic should you have any questions or concerns. The clinic phone number is [phone].  Please show the [facility] at check-in to the Emergency Department and triage nurse.

## 2018-06-17 NOTE — Telephone Encounter (Signed)
Scheduled appt per 4/20 los. °

## 2018-06-18 LAB — CANCER ANTIGEN 19-9: CA 19-9: 27 U/mL (ref 0–35)

## 2018-06-19 ENCOUNTER — Other Ambulatory Visit: Payer: Self-pay

## 2018-06-19 ENCOUNTER — Inpatient Hospital Stay: Payer: Medicare Other

## 2018-06-19 ENCOUNTER — Other Ambulatory Visit: Payer: Self-pay | Admitting: Obstetrics & Gynecology

## 2018-06-19 VITALS — BP 138/62 | HR 82 | Temp 98.3°F | Resp 17

## 2018-06-19 DIAGNOSIS — D6959 Other secondary thrombocytopenia: Secondary | ICD-10-CM | POA: Diagnosis not present

## 2018-06-19 DIAGNOSIS — Z452 Encounter for adjustment and management of vascular access device: Secondary | ICD-10-CM | POA: Diagnosis not present

## 2018-06-19 DIAGNOSIS — C250 Malignant neoplasm of head of pancreas: Secondary | ICD-10-CM | POA: Diagnosis not present

## 2018-06-19 DIAGNOSIS — Z5111 Encounter for antineoplastic chemotherapy: Secondary | ICD-10-CM | POA: Diagnosis not present

## 2018-06-19 DIAGNOSIS — Z7189 Other specified counseling: Secondary | ICD-10-CM

## 2018-06-19 DIAGNOSIS — Z5189 Encounter for other specified aftercare: Secondary | ICD-10-CM | POA: Diagnosis not present

## 2018-06-19 DIAGNOSIS — E039 Hypothyroidism, unspecified: Secondary | ICD-10-CM | POA: Diagnosis not present

## 2018-06-19 MED ORDER — PEGFILGRASTIM INJECTION 6 MG/0.6ML ~~LOC~~
6.0000 mg | PREFILLED_SYRINGE | Freq: Once | SUBCUTANEOUS | Status: AC
  Administered 2018-06-19: 6 mg via SUBCUTANEOUS

## 2018-06-19 MED ORDER — PEGFILGRASTIM INJECTION 6 MG/0.6ML ~~LOC~~
PREFILLED_SYRINGE | SUBCUTANEOUS | Status: AC
  Filled 2018-06-19: qty 0.6

## 2018-06-19 MED ORDER — HEPARIN SOD (PORK) LOCK FLUSH 100 UNIT/ML IV SOLN
500.0000 [IU] | Freq: Once | INTRAVENOUS | Status: AC | PRN
  Administered 2018-06-19: 500 [IU]
  Filled 2018-06-19: qty 5

## 2018-06-19 MED ORDER — SODIUM CHLORIDE 0.9% FLUSH
10.0000 mL | INTRAVENOUS | Status: DC | PRN
  Administered 2018-06-19: 10 mL
  Filled 2018-06-19: qty 10

## 2018-06-19 NOTE — Telephone Encounter (Signed)
Patient is requesting 90 day supply.

## 2018-06-19 NOTE — Patient Instructions (Signed)
Pegfilgrastim injection What is this medicine? PEGFILGRASTIM (PEG fil gra stim) is a long-acting granulocyte colony-stimulating factor that stimulates the growth of neutrophils, a type of white blood cell important in the body's fight against infection. It is used to reduce the incidence of fever and infection in patients with certain types of cancer who are receiving chemotherapy that affects the bone marrow, and to increase survival after being exposed to high doses of radiation. This medicine may be used for other purposes; ask your health care provider or pharmacist if you have questions. COMMON BRAND NAME(S): Neulasta What should I tell my health care provider before I take this medicine? They need to know if you have any of these conditions: -kidney disease -latex allergy -ongoing radiation therapy -sickle cell disease -skin reactions to acrylic adhesives (On-Body Injector only) -an unusual or allergic reaction to pegfilgrastim, filgrastim, other medicines, foods, dyes, or preservatives -pregnant or trying to get pregnant -breast-feeding How should I use this medicine? This medicine is for injection under the skin. If you get this medicine at home, you will be taught how to prepare and give the pre-filled syringe or how to use the On-body Injector. Refer to the patient Instructions for Use for detailed instructions. Use exactly as directed. Tell your healthcare provider immediately if you suspect that the On-body Injector may not have performed as intended or if you suspect the use of the On-body Injector resulted in a missed or partial dose. It is important that you put your used needles and syringes in a special sharps container. Do not put them in a trash can. If you do not have a sharps container, call your pharmacist or healthcare provider to get one. Talk to your pediatrician regarding the use of this medicine in children. While this drug may be prescribed for selected conditions,  precautions do apply. Overdosage: If you think you have taken too much of this medicine contact a poison control center or emergency room at once. NOTE: This medicine is only for you. Do not share this medicine with others. What if I miss a dose? It is important not to miss your dose. Call your doctor or health care professional if you miss your dose. If you miss a dose due to an On-body Injector failure or leakage, a new dose should be administered as soon as possible using a single prefilled syringe for manual use. What may interact with this medicine? Interactions have not been studied. Give your health care provider a list of all the medicines, herbs, non-prescription drugs, or dietary supplements you use. Also tell them if you smoke, drink alcohol, or use illegal drugs. Some items may interact with your medicine. This list may not describe all possible interactions. Give your health care provider a list of all the medicines, herbs, non-prescription drugs, or dietary supplements you use. Also tell them if you smoke, drink alcohol, or use illegal drugs. Some items may interact with your medicine. What should I watch for while using this medicine? You may need blood work done while you are taking this medicine. If you are going to need a MRI, CT scan, or other procedure, tell your doctor that you are using this medicine (On-Body Injector only). What side effects may I notice from receiving this medicine? Side effects that you should report to your doctor or health care professional as soon as possible: -allergic reactions like skin rash, itching or hives, swelling of the face, lips, or tongue -dizziness -fever -pain, redness, or irritation at site   where injected -pinpoint red spots on the skin -red or dark-brown urine -shortness of breath or breathing problems -stomach or side pain, or pain at the shoulder -swelling -tiredness -trouble passing urine or change in the amount of urine Side  effects that usually do not require medical attention (report to your doctor or health care professional if they continue or are bothersome): -bone pain -muscle pain This list may not describe all possible side effects. Call your doctor for medical advice about side effects. You may report side effects to FDA at 1-800-FDA-1088. Where should I keep my medicine? Keep out of the reach of children. Store pre-filled syringes in a refrigerator between 2 and 8 degrees C (36 and 46 degrees F). Do not freeze. Keep in carton to protect from light. Throw away this medicine if it is left out of the refrigerator for more than 48 hours. Throw away any unused medicine after the expiration date. NOTE: This sheet is a summary. It may not cover all possible information. If you have questions about this medicine, talk to your doctor, pharmacist, or health care provider.  2018 [employer] (2016-02-10 12:58:03)  

## 2018-06-20 ENCOUNTER — Telehealth: Payer: Self-pay | Admitting: *Deleted

## 2018-06-26 ENCOUNTER — Other Ambulatory Visit: Payer: Self-pay | Admitting: Hematology

## 2018-06-26 DIAGNOSIS — C250 Malignant neoplasm of head of pancreas: Secondary | ICD-10-CM

## 2018-07-01 ENCOUNTER — Telehealth: Payer: Self-pay

## 2018-07-01 NOTE — Telephone Encounter (Signed)
Left voice message for this patient regarding her desire to cancel her appointments on 5/11.  This call essentially was to check on the patient to make sure she is doing okay.  I requested she call me back and let me know how she is.

## 2018-07-08 ENCOUNTER — Ambulatory Visit: Payer: Medicare Other | Admitting: Nurse Practitioner

## 2018-07-08 ENCOUNTER — Other Ambulatory Visit: Payer: Medicare Other

## 2018-07-08 ENCOUNTER — Ambulatory Visit: Payer: Medicare Other

## 2018-07-21 ENCOUNTER — Other Ambulatory Visit: Payer: Self-pay | Admitting: Hematology

## 2018-07-21 DIAGNOSIS — C250 Malignant neoplasm of head of pancreas: Secondary | ICD-10-CM

## 2018-07-25 NOTE — Progress Notes (Signed)
Carly [name]   [phone] [phone]   Clinic Follow up Note   Patient Care Team: Carly Amel, MD as PCP - General (Family Medicine) Carly Silence, MD as Consulting Physician (Gastroenterology) Carly Merle, MD as Consulting Physician (Hematology)  Date of Service:  07/29/2018  CHIEF COMPLAINT: F/u of pancreatic cancer  SUMMARY OF ONCOLOGIC HISTORY: Oncology History   Cancer Staging Pancreatic cancer [facility]) Staging form: Exocrine Pancreas, AJCC 8th Edition - Clinical stage from 06/13/2017: Stage III (cT4, cN0, cM0) - Signed by Carly Merle, MD on 06/13/2017       Pancreatic cancer ([place])   06/08/2017 Imaging    06/08/2017 CT Abdomen IMPRESSION: Irregular solid hypoechoic mass within the pancreatic head, 2.6 x 2.2 cm with associated pancreatic ductal dilatation and pancreatic atrophy, most compatible with pancreatic cancer. 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  Diffuse colonic diverticulosis. Slight stranding around the distal descending colon may reflect early active diverticulitis.  Bilateral renal parapelvic cysts.  Aortic atherosclerosis.    06/13/2017 Initial Diagnosis    Pancreatic cancer ([place])    06/13/2017 Cancer Staging    Staging form: Exocrine Pancreas, AJCC 8th Edition - Clinical stage from 06/13/2017: Stage III (cT4, cN0, cM0) - Signed by Carly Merle, MD on 06/13/2017    06/13/2017 Procedure    EUS by Dr. Paulita [name] 06/13/17  IMPRESSION:  -There was no sign of significant pathology in the ampulla. - There was no evidence of significant pathology in the left lobe of the liver. - A mass was identified in the pancreatic head. Abutment SMV; invasion portal confluence; no adenopathy. Fine needle aspiration performed. If this is a pancreatic adenocarcinoma, it would be staged T4/N0/Mx by EUS.    06/13/2017 Initial Biopsy    Diagnosis 06/13/17  FINE NEEDLE ASPIRATION, ENDOSCOPIC, PANCREAS HEAD (SPECIMEN 1 OF 1 COLLECTED 06/13/17): ADENOCARCINOMA. Preliminary Diagnosis Intraoperative Diagnosis: 1-3) ATYPICAL CELLS, ADDITIONAL MATERIAL REQUESTED. (NDK) 4-6 ADENOCARCINOMA. (NDK) Reported to Dr. Paulita [name] on 06/13/17 @ 11:07am.     06/21/2017 Imaging    CT Chest without contrast IMPRESSION: 1. No findings suspicious for metastatic disease within the chest.  2. Tiny subpleural right lower lobe pulmonary nodule is nonspecific, but likely benign. This can be addressed on follow-up imaging.  3. Aortic Atherosclerosis (ICD10-I70.0).    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  The Hereditary Gene Panel offered by [facility] includes sequencing and/or deletion duplication testing of the following 47 genes: APC, ATM, AXIN2, BARD1, BMPR1A, BRCA1, BRCA2, BRIP1, CDH1, CDK4, CDKN2A (p14ARF), CDKN2A (p16INK4a), CHEK2, CTNNA1, DICER1, EPCAM (Deletion/duplication testing only), GREM1 (promoter region deletion/duplication testing only), KIT, MEN1, MLH1, MSH2, MSH3, MSH6, MUTYH, NBN, NF1, NHTL1, PALB2, PDGFRA, PMS2, POLD1, POLE, PTEN, RAD50, RAD51C, RAD51D, SDHB, SDHC, SDHD, SMAD4, SMARCA4. STK11, TP53, TSC1, TSC2, and VHL.  The following genes were evaluated for sequence changes only: SDHA and HOXB13 c.251G>A variant only. The report date is August 14, 2017.    09/24/2017 Progression    09/24/2017 CT CAP Pancreas: The mass involving the head of pancreas measures 2.5 x 3.8 by 3.1 cm. On the previous exam this measured 2.2 x 2.6 by 3.4 cm.    09/24/2017 Imaging    09/24/2017 CT CAP IMPRESSION: 1. There is been interval local progression of disease with increased  size of head of pancreas neoplasm. There is progressive vascular involvement with partial encasement of the celiac trunk and SMA. Continued encasement and marked  narrowing of the portal venous confluence. 2. No evidence for metastatic adenopathy within the abdomen, liver metastasis or pulmonary metastasis. 3. Aortic atherosclerosis and LAD coronary artery atherosclerotic calcifications. Aortic Atherosclerosis (ICD10-I70.0).    01/04/2018 Imaging    01/04/2018 CT CAP IMPRESSION: 1. No local disease progression identified. The pancreatic head mass is ill-defined and appears slightly smaller. There is chronic partial encasement of the celiac trunk and SMA and chronic short segment occlusion at the SMV/portal vein confluence. 2. No definite evidence of metastatic disease. Stable small hypervascular lesion inferiorly in the right hepatic lobe, likely an incidental vascular lesion. There is slightly greater mesenteric nodularity posterior to the transverse colon, but no other definite signs of peritoneal carcinomatosis. Attention on follow-up recommended. 3. Stable scattered tiny subpleural pulmonary nodules bilaterally, likely benign based on stability, although attention on follow-up recommended. 4. Mild splenomegaly. 5. Aortic Atherosclerosis (ICD10-I70.0). Port-A-Cath tip in the mid right atrium, stable.    04/02/2018 Imaging    CT AP  IMPRESSION: 1. Stable appearance of pancreatic head mass with chronic partial encasement of the celiac trunk and SMA and occlusion of the portal venous confluence. 2. No definite evidence for metastatic disease. Similar appearance of subtle soft tissue nodule posterior to the transverse colon without additional signs of peritoneal carcinomatosis. 3. Mild splenomegaly. 4.  Aortic Atherosclerosis (ICD10-I70.0).       CURRENT THERAPY:  -chemo FOLFIRINOX every 2 weeksstarting 5/23/19with dose reduction due to thrombocytopenia.Further reduced to every 3 weeks starting 05/20/18 due to thrombocytopenia. She had chemo break 06/19/18-07/27/18.  -Promacta for thrombocytopenia, currently ay 38m daily. Increased to  1015mdaily on 05/06/18  INTERVAL HISTORY:  [name] here for a follow up and treatment. She presents to the clinic alone. She notes after chemo break she feels well. She went to the beach for 5 days. She overall has been continuing social distancing. She notes she feels well. She had constipation last week and took a little miralax to help. She notes having her lower left quadrant pain with is mild. She does not need pain medication. She notes she has been on Promacta. She notes she drove herself today and was able to drive riding lawn mower this weekend.     REVIEW OF SYSTEMS:   Constitutional: Denies fevers, chills or abnormal weight loss Eyes: Denies blurriness of vision Ears, nose, mouth, throat, and face: Denies mucositis or sore throat Respiratory: Denies cough, dyspnea or wheezes Cardiovascular: Denies palpitation, chest discomfort or lower extremity swelling Gastrointestinal:  Denies nausea, heartburn or change in bowel habits (+) LLQ pain, mild  Skin: Denies abnormal skin rashes Lymphatics: Denies new lymphadenopathy or easy bruising Neurological:Denies numbness, tingling or new weaknesses Behavioral/Psych: Mood is stable, no new changes  All other systems were reviewed with the patient and are negative.  MEDICAL HISTORY:  Past Medical History:  Diagnosis Date  . Allergic rhinitis    Skin Test 04-14-2009  . Asthma   . [place] [place]   adenocarcinoma of uterus  . [id number]   PANCREATIC   . Diabetes mellitus without complication ([place]   type II, no meds per pt  . Family history of breast cancer   . History of uterine cancer   . Hyperlipemia   . Hypertension   . Insomnia     SURGICAL HISTORY: Past Surgical History:  Procedure  Laterality Date  . ABDOMINAL HYSTERECTOMY    . CARPAL TUNNEL RELEASE    . EUS N/A 06/13/2017   Procedure: FULL UPPER ENDOSCOPIC ULTRASOUND (EUS) RADIAL;  Surgeon: Carly Silence, MD;  Location: [facility];  Service:  Endoscopy;  Laterality: N/A;  . I&D EXTREMITY  09/17/2011   Procedure: IRRIGATION AND DEBRIDEMENT EXTREMITY;  Surgeon: Roseanne Kaufman, MD;  Location: [facility];  Service: Orthopedics;  Laterality: Left;  with drain placement  . IR FLUORO GUIDE PORT INSERTION RIGHT  07/18/2017  . IR US GUIDE VASC ACCESS RIGHT  07/18/2017  . NASAL SEPTOPLASTY W/ TURBINOPLASTY    . pelvic floor rebuild    . TONSILLECTOMY    . TOTAL ABDOMINAL HYSTERECTOMY W/ BILATERAL SALPINGOOPHORECTOMY      I have reviewed the social history and family history with the patient and they are unchanged from previous note.  ALLERGIES:  is allergic to cephalexin and nabumetone.  MEDICATIONS:  Current Outpatient Medications  Medication Sig Dispense Refill  . ALPRAZolam (XANAX) 0.5 MG tablet Take 1 tablet (0.5 mg total) by mouth at bedtime as needed for anxiety. 90 tablet 0  . Ascorbic Acid (VITAMIN C) 500 MG PACK Take 100 mg by mouth daily.    Marland Kitchen atorvastatin (LIPITOR) 10 MG tablet Take 10 mg by mouth at bedtime.     Marland Kitchen CALCIUM PO Take 1 tablet by mouth daily.     . Cholecalciferol (VITAMIN D) 2000 units CAPS Take by mouth.    . Cyanocobalamin (VITAMIN B-12 PO) Take 1 tablet by mouth daily.     . diphenoxylate-atropine (LOMOTIL) 2.5-0.025 MG tablet Take 1-2 tablets by mouth 4 (four) times daily as needed for diarrhea or loose stools. 60 tablet 1  . eltrombopag (PROMACTA) 75 MG tablet Take 1 tablet (75 mg total) by mouth daily. Take on an empty stomach 1 hour before meals or 2 hours after. 30 tablet 2  . hydrochlorothiazide (HYDRODIURIL) 25 MG tablet Take 1 tablet by mouth daily as needed.     Marland Kitchen HYDROcodone-acetaminophen (NORCO) 5-325 MG tablet Take 1 tablet by mouth every 6 (six) hours as needed for moderate pain. 40 tablet 0  . hyoscyamine (LEVBID) 0.375 MG 12 hr tablet Take 0.375 mg by mouth every 12 (twelve) hours as needed for cramping.     Marland Kitchen KLOR-CON M20 20 MEQ tablet TAKE 1 TABLET BY MOUTH TWICE A DAY 60 tablet 1  . KLOR-CON M20  20 MEQ tablet TAKE 1 TABLET BY MOUTH EVERY DAY 30 tablet 1  . levothyroxine (SYNTHROID, LEVOTHROID) 25 MCG tablet Take 25 mcg by mouth daily before breakfast.     . lidocaine-prilocaine (EMLA) cream Apply to affected area once 30 g 3  . magic mouthwash SOLN Take 5 ml swish and spit three times daily as needed. 100 mL 1  . magic mouthwash w/lidocaine SOLN Take 5 mLs by mouth 3 (three) times daily as needed for mouth pain. 240 mL 0  . metFORMIN (GLUCOPHAGE) 500 MG tablet TAKE 1 TABLET BY MOUTH EVERY DAY WITH BREAKFAST 30 tablet 1  . Omega-3 Fatty Acids (FISH OIL PO) Take 1 capsule by mouth daily.     Marland Kitchen omeprazole (PRILOSEC) 20 MG capsule Take 1 tablet by mouth daily.    . ondansetron (ZOFRAN) 8 MG tablet Take 1 tablet (8 mg total) by mouth 2 (two) times daily as needed for refractory nausea / vomiting. Start on day 3 after chemotherapy. 30 tablet 1  . ONETOUCH DELICA LANCETS 62G MISC USE TO TEST YOUR  BLOOD SUGAR ONCE A DAY FASTING AND 2 HRS AFTER A MEAL AS NEEDED  3  . ONETOUCH VERIO test strip USE TO TEST YOUR BLOOD SUGAR ONCE A DAY FASTING & 2 HRS AFTER A MEAL AS NEEDED  3  . potassium chloride SA (KLOR-CON M20) 20 MEQ tablet Take 1 tablet (20 mEq total) by mouth 2 (two) times daily. 60 tablet 1  . prochlorperazine (COMPAZINE) 10 MG tablet Take 1 tablet (10 mg total) by mouth every 6 (six) hours as needed (NAUSEA). 30 tablet 2  . PROMACTA 50 MG tablet TAKE 2 TABLETS (100 MG) BY MOUTH EVERY DAY ON AN EMPTY STOMACH, 1 HOUR BEFORE OR 2 HOURS AFTER A MEAL 60 tablet 0  . ROCKLATAN 0.02-0.005 % SOLN PLACE 1 DROP INTO BOTH EYES AT BEDTIME  12  . traZODone (DESYREL) 100 MG tablet TAKE 1 TABLET BY MOUTH EVERYDAY AT BEDTIME 90 tablet 1   No current facility-administered medications for this visit.     PHYSICAL EXAMINATION: ECOG PERFORMANCE STATUS: 1 - Symptomatic but completely ambulatory  Vitals:   07/29/18 1053  BP: (!) 147/81  Pulse: 89  Resp: 18  Temp: 98.3 F (36.8 C)  SpO2: 98%   Filed  Weights   07/29/18 1053  Weight: 140 lb 8 oz (63.7 kg)    GENERAL:alert, no distress and comfortable SKIN: skin color, texture, turgor are normal, no rashes or significant lesions EYES: normal, Conjunctiva are pink and non-injected, sclera clear  NECK: supple, thyroid normal size, non-tender, without nodularity LYMPH:  no palpable lymphadenopathy in the cervical, axillary  LUNGS: clear to auscultation and percussion with normal breathing effort HEART: regular rate & rhythm and no murmurs and no lower extremity edema ABDOMEN:abdomen soft, non-tender and normal bowel sounds Musculoskeletal:no cyanosis of digits and no clubbing  NEURO: alert & oriented x 3 with fluent speech, no focal motor/sensory deficits  LABORATORY DATA:  I have reviewed the data as listed CBC Latest Ref Rng & Units 07/29/2018 06/17/2018 05/27/2018  WBC 4.0 - 10.5 K/uL 5.1 5.8 2.7(L)  Hemoglobin 12.0 - 15.0 g/dL 11.0(L) 9.6(L) 8.9(L)  Hematocrit 36.0 - 46.0 % 33.2(L) 28.2(L) 26.8(L)  Platelets 150 - 400 K/uL 108(L) 121(L) 85(L)     CMP Latest Ref Rng & Units 07/29/2018 06/17/2018 05/27/2018  Glucose 70 - 99 mg/dL 159(H) 183(H) 216(H)  BUN 8 - 23 mg/dL '13 11 15  ' Creatinine 0.44 - 1.00 mg/dL 0.78 0.68 0.73  Sodium 135 - 145 mmol/L 139 139 139  Potassium 3.5 - 5.1 mmol/L 4.4 4.3 4.4  Chloride 98 - 111 mmol/L 104 105 105  CO2 22 - 32 mmol/L '28 25 25  ' Calcium 8.9 - 10.3 mg/dL 9.4 9.2 8.7(L)  Total Protein 6.5 - 8.1 g/dL 6.9 6.6 6.5  Total Bilirubin 0.3 - 1.2 mg/dL 0.5 0.5 1.3(H)  Alkaline Phos 38 - 126 U/L 193(H) 225(H) 230(H)  AST 15 - 41 U/L 19 35 33  ALT 0 - 44 U/L '18 28 20      ' RADIOGRAPHIC STUDIES: I have personally reviewed the radiological images as listed and agreed with the findings in the report. No results found.   ASSESSMENT & PLAN:  Carly [name] is a 78 y.o. 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 Unfortunately this is likely unresectable tumor, or at least a borderline resectable disease due to the invasion of SMA. -She has been on first lineChemo FOLFIRINOXsince 5/23/19with dose reduction due to cytopenia, andPromactafor chemo related thrombocytopenia. Toleratingchemo verywell. Reduced to every 3 weeks given prolonged blood count recovery.  -She[facility]previously[facility] Gennaro Africa her tumor was not resectable.He encouraged her to continue with current chemo treatment for as long as possible. -She is tolerating FOLFIRINOX every 3 weeks well with controlled diarrhea and improved thrombocytopenia.  -her last chemo was 6 weeks ago. Post chemo break she is doing well. Physical exam unremarkable. Labs reviewed, CBC and CMP WNL except hg 11, PLT 108K, BG 159, alk phos 193. CA 19.9 still pending. Overall adequate to proceed with FOLFIRINOX today.  -Plan to do next CT scan in 07/2018. She is agreeable.  -F/u in 3 weeks with CT scan   2. DM -Continue metformin. -BG at 159 today (07/29/18)  3. Diarrhea, abdominal pain, and weight loss -Currently on Lomotil for diarrhea, Tramadol for moderate pain, and hydrocodone for severe pain. -Pain resolved,Diarrhea controlled with chemo reduction.  -During latest chemo break she had constipation. With a little miralx she developed diarrhea shortly.   4. H/oEndometrialCancer, Stage I, diagnosed in 1981 -Treated with hysterectomy and BSO in 1981. -f/u with OB/GYN and continue annual Pap smears. -Genetics was negative  5.Hypokalemia -Currently on Potassium 59mq daily, will continue -Kremains normal lately.  6.HTN, Hypothyroidism -f/u with  PCP -BP at 147/81 today (07/29/18)  7.Chemo induced thrombocytopenia -Improved with Promacta761mnd chemo dose reduction. -IncreasedPromacta on 05/05/28 due to decreased level and reduced chemo to every 3 weeks starting 05/20/18.  -PLT 108K today (07/29/18).  -Continue Promacta at 10036maily.  8.Goal of care discussion -Patient understand her cancer treatment is palliative, to prolong her life. -She is full code now  9. 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   -If she develops pain or tingling I will recommend Gabapentin for management. -Currently resolved  Plan -Labs reviewed and adequate to proceed with same low dose FOLFIRINOX today and continue every 3 weeks  -Lab, flush, f/u and chemo FOLFIRINOX in 3 weeks with CT scan a few days before  -Continue Promacta 100m88mily  No problem-specific Assessment & Plan notes found for this encounter.   Orders Placed This Encounter  Procedures  . CT Abdomen Pelvis W Contrast    Standing Status:   Future    Standing Expiration Date:   07/29/2019    Order Specific Question:   If indicated for the ordered procedure, I authorize the administration of contrast media per Radiology protocol    Answer:   Yes    Order Specific Question:   [facility]    [facility]   Is Oral Contrast requested for this exam?    Answer:   Yes, Per Radiology protocol    Order Specific Question:   Radiology Contrast Protocol - do NOT remove file path    Answer:   \\[email or web address]  . CT Chest W Contrast    Standing Status:   Future    Standing Expiration Date:   07/29/2019    Order Specific Question:   If indicated for the ordered procedure, I authorize the administration of contrast media per Radiology protocol  Answer:   Yes    Order Specific Question:   Preferred imaging location?     Answer:   [facility]    Order Specific Question:   Radiology Contrast Protocol - do NOT remove file path    Answer:   \\[email or web address]   All questions were answered. The patient knows to call the clinic with any problems, questions or concerns. No barriers to learning was detected. I spent 20 minutes counseling the patient face to face. The total time spent in the appointment was 25 minutes and more than 50% was on counseling and review of test results     Carly Merle, MD 07/29/2018   I, Joslyn Devon, am acting as scribe for Carly Merle, MD.   I have reviewed the above documentation for accuracy and completeness, and I agree with the above.

## 2018-07-29 ENCOUNTER — Inpatient Hospital Stay: Payer: Medicare Other | Attending: Hematology

## 2018-07-29 ENCOUNTER — Other Ambulatory Visit: Payer: Self-pay

## 2018-07-29 ENCOUNTER — Telehealth (HOSPITAL_COMMUNITY): Payer: Self-pay

## 2018-07-29 ENCOUNTER — Inpatient Hospital Stay: Payer: Medicare Other

## 2018-07-29 ENCOUNTER — Inpatient Hospital Stay (HOSPITAL_BASED_OUTPATIENT_CLINIC_OR_DEPARTMENT_OTHER): Payer: Medicare Other | Admitting: Hematology

## 2018-07-29 VITALS — BP 147/81 | HR 89 | Temp 98.3°F | Resp 18 | Ht 62.0 in | Wt 140.5 lb

## 2018-07-29 DIAGNOSIS — E039 Hypothyroidism, unspecified: Secondary | ICD-10-CM

## 2018-07-29 DIAGNOSIS — C250 Malignant neoplasm of head of pancreas: Secondary | ICD-10-CM | POA: Diagnosis not present

## 2018-07-29 DIAGNOSIS — D6959 Other secondary thrombocytopenia: Secondary | ICD-10-CM | POA: Diagnosis not present

## 2018-07-29 DIAGNOSIS — C787 Secondary malignant neoplasm of liver and intrahepatic bile duct: Secondary | ICD-10-CM | POA: Diagnosis not present

## 2018-07-29 DIAGNOSIS — Z8542 Personal history of malignant neoplasm of other parts of uterus: Secondary | ICD-10-CM | POA: Insufficient documentation

## 2018-07-29 DIAGNOSIS — Z452 Encounter for adjustment and management of vascular access device: Secondary | ICD-10-CM | POA: Insufficient documentation

## 2018-07-29 DIAGNOSIS — E876 Hypokalemia: Secondary | ICD-10-CM | POA: Diagnosis not present

## 2018-07-29 DIAGNOSIS — Z5189 Encounter for other specified aftercare: Secondary | ICD-10-CM | POA: Insufficient documentation

## 2018-07-29 DIAGNOSIS — I10 Essential (primary) hypertension: Secondary | ICD-10-CM

## 2018-07-29 DIAGNOSIS — Z7189 Other specified counseling: Secondary | ICD-10-CM

## 2018-07-29 DIAGNOSIS — Z95828 Presence of other vascular implants and grafts: Secondary | ICD-10-CM

## 2018-07-29 DIAGNOSIS — Z5111 Encounter for antineoplastic chemotherapy: Secondary | ICD-10-CM | POA: Insufficient documentation

## 2018-07-29 LAB — CBC WITH DIFFERENTIAL (CANCER CENTER ONLY)
Abs Immature Granulocytes: 0 10*3/uL (ref 0.00–0.07)
Basophils Absolute: 0 10*3/uL (ref 0.0–0.1)
Basophils Relative: 0 %
Eosinophils Absolute: 0.1 10*3/uL (ref 0.0–0.5)
Eosinophils Relative: 2 %
HCT: 33.2 % — ABNORMAL LOW (ref 36.0–46.0)
Hemoglobin: 11 g/dL — ABNORMAL LOW (ref 12.0–15.0)
Immature Granulocytes: 0 %
Lymphocytes Relative: 11 %
Lymphs Abs: 0.6 10*3/uL — ABNORMAL LOW (ref 0.7–4.0)
MCH: 34 pg (ref 26.0–34.0)
MCHC: 33.1 g/dL (ref 30.0–36.0)
MCV: 102.5 fL — ABNORMAL HIGH (ref 80.0–100.0)
Monocytes Absolute: 0.4 10*3/uL (ref 0.1–1.0)
Monocytes Relative: 9 %
Neutro Abs: 4 10*3/uL (ref 1.7–7.7)
Neutrophils Relative %: 78 %
Platelet Count: 108 10*3/uL — ABNORMAL LOW (ref 150–400)
RBC: 3.24 MIL/uL — ABNORMAL LOW (ref 3.87–5.11)
RDW: 12.6 % (ref 11.5–15.5)
WBC Count: 5.1 10*3/uL (ref 4.0–10.5)
nRBC: 0 % (ref 0.0–0.2)

## 2018-07-29 LAB — CMP (CANCER CENTER ONLY)
ALT: 18 U/L (ref 0–44)
AST: 19 U/L (ref 15–41)
Albumin: 3.6 g/dL (ref 3.5–5.0)
Alkaline Phosphatase: 193 U/L — ABNORMAL HIGH (ref 38–126)
Anion gap: 7 (ref 5–15)
BUN: 13 mg/dL (ref 8–23)
CO2: 28 mmol/L (ref 22–32)
Calcium: 9.4 mg/dL (ref 8.9–10.3)
Chloride: 104 mmol/L (ref 98–111)
Creatinine: 0.78 mg/dL (ref 0.44–1.00)
GFR, Est AFR Am: >60 mL/min (ref >60)
GFR, Estimated: >60 mL/min (ref >60)
Glucose, Bld: 159 mg/dL — ABNORMAL HIGH (ref 70–99)
Potassium: 4.4 mmol/L (ref 3.5–5.1)
Sodium: 139 mmol/L (ref 135–145)
Total Bilirubin: 0.5 mg/dL (ref 0.3–1.2)
Total Protein: 6.9 g/dL (ref 6.5–8.1)

## 2018-07-29 MED ORDER — DEXTROSE 5 % IV SOLN
Freq: Once | INTRAVENOUS | Status: AC
  Administered 2018-07-29: 12:00:00 via INTRAVENOUS
  Filled 2018-07-29: qty 250

## 2018-07-29 MED ORDER — IRINOTECAN HCL CHEMO INJECTION 100 MG/5ML: 100.0000 mg/m2 | Freq: Once | INTRAVENOUS | Status: DC

## 2018-07-29 MED ORDER — ATROPINE SULFATE 1 MG/ML IJ SOLN: 0.5000 mg | Freq: Once | INTRAMUSCULAR | Status: DC | PRN

## 2018-07-29 MED ORDER — ATROPINE SULFATE 1 MG/ML IJ SOLN
0.4000 mg | Freq: Once | INTRAMUSCULAR | Status: AC
  Administered 2018-07-29: 16:00:00 0.4 mg via INTRAVENOUS

## 2018-07-29 MED ORDER — PALONOSETRON HCL INJECTION 0.25 MG/5ML
0.2500 mg | Freq: Once | INTRAVENOUS | Status: AC
  Administered 2018-07-29: 12:00:00 0.25 mg via INTRAVENOUS

## 2018-07-29 MED ORDER — DEXAMETHASONE SODIUM PHOSPHATE 10 MG/ML IJ SOLN
INTRAMUSCULAR | Status: AC
  Filled 2018-07-29: qty 1

## 2018-07-29 MED ORDER — LEUCOVORIN CALCIUM INJECTION 350 MG: 400.0000 mg/m2 | Freq: Once | INTRAVENOUS | Status: DC

## 2018-07-29 MED ORDER — SODIUM CHLORIDE 0.9 % IV SOLN
1800.0000 mg/m2 | INTRAVENOUS | Status: DC
  Administered 2018-07-29: 17:00:00 3050 mg via INTRAVENOUS
  Filled 2018-07-29: qty 61

## 2018-07-29 MED ORDER — SODIUM CHLORIDE 0.9 % IV SOLN
INTRAVENOUS | Status: DC
  Administered 2018-07-29: 16:00:00 via INTRAVENOUS
  Filled 2018-07-29: qty 250

## 2018-07-29 MED ORDER — SODIUM CHLORIDE 0.9 % IV SOLN
100.0000 mg/m2 | Freq: Once | INTRAVENOUS | Status: AC
  Administered 2018-07-29: 16:00:00 180 mg via INTRAVENOUS
  Filled 2018-07-29: qty 9

## 2018-07-29 MED ORDER — ATROPINE SULFATE 0.4 MG/ML IJ SOLN
INTRAMUSCULAR | Status: AC
  Filled 2018-07-29: qty 1

## 2018-07-29 MED ORDER — SODIUM CHLORIDE 0.9 % IV SOLN
400.0000 mg/m2 | Freq: Once | INTRAVENOUS | Status: AC
  Administered 2018-07-29: 680 mg via INTRAVENOUS
  Filled 2018-07-29: qty 34

## 2018-07-29 MED ORDER — DEXAMETHASONE SODIUM PHOSPHATE 10 MG/ML IJ SOLN
10.0000 mg | Freq: Once | INTRAMUSCULAR | Status: AC
  Administered 2018-07-29: 10 mg via INTRAVENOUS

## 2018-07-29 MED ORDER — PALONOSETRON HCL INJECTION 0.25 MG/5ML
INTRAVENOUS | Status: AC
  Filled 2018-07-29: qty 5

## 2018-07-29 MED ORDER — SODIUM CHLORIDE 0.9% FLUSH
10.0000 mL | INTRAVENOUS | Status: DC | PRN
  Administered 2018-07-29: 10 mL
  Filled 2018-07-29: qty 10

## 2018-07-29 MED ORDER — OXALIPLATIN CHEMO INJECTION 100 MG/20ML
40.0000 mg/m2 | Freq: Once | INTRAVENOUS | Status: AC
  Administered 2018-07-29: 13:00:00 70 mg via INTRAVENOUS
  Filled 2018-07-29: qty 14

## 2018-07-29 NOTE — Patient Instructions (Signed)
[facility] Discharge Instructions for Patients Receiving Chemotherapy  Today you received the following chemotherapy agents Oxaliplatin, Irinotecan, Leucovorin and Adrucil. To help prevent nausea and vomiting after your treatment, we encourage you to take your nausea medication as directed - BUT NO ZOFRAN FOR 3 DAYS   If you develop nausea and vomiting that is not controlled by your nausea medication, call the clinic.   BELOW ARE SYMPTOMS THAT SHOULD BE REPORTED IMMEDIATELY:  *FEVER GREATER THAN 100.5 F  *CHILLS WITH OR WITHOUT FEVER  NAUSEA AND VOMITING THAT IS NOT CONTROLLED WITH YOUR NAUSEA MEDICATION  *UNUSUAL SHORTNESS OF BREATH  *UNUSUAL BRUISING OR BLEEDING  TENDERNESS IN MOUTH AND THROAT WITH OR WITHOUT PRESENCE OF ULCERS  *URINARY PROBLEMS  *BOWEL PROBLEMS  UNUSUAL RASH Items with * indicate a potential emergency and should be followed up as soon as possible.  Feel free to call the clinic you have any questions or concerns. The clinic phone number is [phone].  Please show the [place] at check-in to the Emergency Department and triage nurse.

## 2018-07-29 NOTE — Progress Notes (Signed)
At MD request, will place Irinotecan and Leucovorin in NS. Pt has hyperglycemia/DM. Kennith Center, Pharm.D., CPP 07/29/2018@12 :00 PM

## 2018-07-30 ENCOUNTER — Encounter: Payer: Self-pay | Admitting: Hematology

## 2018-07-30 ENCOUNTER — Telehealth: Payer: Self-pay | Admitting: Hematology

## 2018-07-30 LAB — CANCER ANTIGEN 19-9: CA 19-9: 26 U/mL (ref 0–35)

## 2018-07-30 NOTE — Telephone Encounter (Signed)
Scheduled appt per 6/1 los.  Will get a copy of her scheduled appts at her next appt.

## 2018-07-31 ENCOUNTER — Inpatient Hospital Stay: Payer: Medicare Other

## 2018-07-31 ENCOUNTER — Other Ambulatory Visit: Payer: Self-pay

## 2018-07-31 VITALS — BP 136/79 | HR 100 | Temp 98.4°F | Resp 18

## 2018-07-31 DIAGNOSIS — I10 Essential (primary) hypertension: Secondary | ICD-10-CM | POA: Diagnosis not present

## 2018-07-31 DIAGNOSIS — D6959 Other secondary thrombocytopenia: Secondary | ICD-10-CM | POA: Diagnosis not present

## 2018-07-31 DIAGNOSIS — Z5111 Encounter for antineoplastic chemotherapy: Secondary | ICD-10-CM | POA: Diagnosis not present

## 2018-07-31 DIAGNOSIS — E876 Hypokalemia: Secondary | ICD-10-CM | POA: Diagnosis not present

## 2018-07-31 DIAGNOSIS — C250 Malignant neoplasm of head of pancreas: Secondary | ICD-10-CM

## 2018-07-31 DIAGNOSIS — Z7189 Other specified counseling: Secondary | ICD-10-CM

## 2018-07-31 DIAGNOSIS — Z95828 Presence of other vascular implants and grafts: Secondary | ICD-10-CM

## 2018-07-31 DIAGNOSIS — C787 Secondary malignant neoplasm of liver and intrahepatic bile duct: Secondary | ICD-10-CM | POA: Diagnosis not present

## 2018-07-31 MED ORDER — HEPARIN SOD (PORK) LOCK FLUSH 100 UNIT/ML IV SOLN
500.0000 [IU] | Freq: Once | INTRAVENOUS | Status: AC | PRN
  Administered 2018-07-31: 500 [IU]
  Filled 2018-07-31: qty 5

## 2018-07-31 MED ORDER — SODIUM CHLORIDE 0.9% FLUSH
10.0000 mL | INTRAVENOUS | Status: DC | PRN
  Administered 2018-07-31: 10 mL
  Filled 2018-07-31: qty 10

## 2018-07-31 MED ORDER — PEGFILGRASTIM INJECTION 6 MG/0.6ML ~~LOC~~
PREFILLED_SYRINGE | SUBCUTANEOUS | Status: AC
  Filled 2018-07-31: qty 0.6

## 2018-07-31 MED ORDER — PEGFILGRASTIM INJECTION 6 MG/0.6ML ~~LOC~~
6.0000 mg | PREFILLED_SYRINGE | Freq: Once | SUBCUTANEOUS | Status: AC
  Administered 2018-07-31: 6 mg via SUBCUTANEOUS

## 2018-07-31 NOTE — Patient Instructions (Signed)
Pegfilgrastim injection  What is this medicine?  PEGFILGRASTIM (PEG fil gra stim) is a long-acting granulocyte colony-stimulating factor that stimulates the growth of neutrophils, a type of white blood cell important in the body's fight against infection. It is used to reduce the incidence of fever and infection in patients with certain types of cancer who are receiving chemotherapy that affects the bone marrow, and to increase survival after being exposed to high doses of radiation.  This medicine may be used for other purposes; ask your health care provider or pharmacist if you have questions.  COMMON BRAND NAME(S): Fulphila, Neulasta, UDENYCA  What should I tell my health care provider before I take this medicine?  They need to know if you have any of these conditions:  -kidney disease  -latex allergy  -ongoing radiation therapy  -sickle cell disease  -skin reactions to acrylic adhesives (On-Body Injector only)  -an unusual or allergic reaction to pegfilgrastim, filgrastim, other medicines, foods, dyes, or preservatives  -pregnant or trying to get pregnant  -breast-feeding  How should I use this medicine?  This medicine is for injection under the skin. If you get this medicine at home, you will be taught how to prepare and give the pre-filled syringe or how to use the On-body Injector. Refer to the patient Instructions for Use for detailed instructions. Use exactly as directed. Tell your healthcare provider immediately if you suspect that the On-body Injector may not have performed as intended or if you suspect the use of the On-body Injector resulted in a missed or partial dose.  It is important that you put your used needles and syringes in a special sharps container. Do not put them in a trash can. If you do not have a sharps container, call your pharmacist or healthcare provider to get one.  Talk to your pediatrician regarding the use of this medicine in children. While this drug may be prescribed for  selected conditions, precautions do apply.  Overdosage: If you think you have taken too much of this medicine contact a poison control center or emergency room at once.  NOTE: This medicine is only for you. Do not share this medicine with others.  What if I miss a dose?  It is important not to miss your dose. Call your doctor or health care professional if you miss your dose. If you miss a dose due to an On-body Injector failure or leakage, a new dose should be administered as soon as possible using a single prefilled syringe for manual use.  What may interact with this medicine?  Interactions have not been studied.  Give your health care provider a list of all the medicines, herbs, non-prescription drugs, or dietary supplements you use. Also tell them if you smoke, drink alcohol, or use illegal drugs. Some items may interact with your medicine.  This list may not describe all possible interactions. Give your health care provider a list of all the medicines, herbs, non-prescription drugs, or dietary supplements you use. Also tell them if you smoke, drink alcohol, or use illegal drugs. Some items may interact with your medicine.  What should I watch for while using this medicine?  You may need blood work done while you are taking this medicine.  If you are going to need a MRI, CT scan, or other procedure, tell your doctor that you are using this medicine (On-Body Injector only).  What side effects may I notice from receiving this medicine?  Side effects that you should report to   your doctor or health care professional as soon as possible:  -allergic reactions like skin rash, itching or hives, swelling of the face, lips, or tongue  -back pain  -dizziness  -fever  -pain, redness, or irritation at site where injected  -pinpoint red spots on the skin  -red or dark-brown urine  -shortness of breath or breathing problems  -stomach or side pain, or pain at the shoulder  -swelling  -tiredness  -trouble passing urine or  change in the amount of urine  Side effects that usually do not require medical attention (report to your doctor or health care professional if they continue or are bothersome):  -bone pain  -muscle pain  This list may not describe all possible side effects. Call your doctor for medical advice about side effects. You may report side effects to FDA at 1-800-FDA-1088.  Where should I keep my medicine?  Keep out of the reach of children.  If you are using this medicine at home, you will be instructed on how to store it. Throw away any unused medicine after the expiration date on the label.  NOTE: This sheet is a summary. It may not cover all possible information. If you have questions about this medicine, talk to your doctor, pharmacist, or health care provider.   2019 Elsevier/Gold Standard (2017-05-21 16:57:08)

## 2018-08-13 ENCOUNTER — Ambulatory Visit (HOSPITAL_COMMUNITY): Payer: Medicare Other

## 2018-08-14 ENCOUNTER — Encounter (HOSPITAL_COMMUNITY): Payer: Self-pay

## 2018-08-14 ENCOUNTER — Other Ambulatory Visit: Payer: Self-pay

## 2018-08-14 ENCOUNTER — Ambulatory Visit (HOSPITAL_COMMUNITY)
Admission: RE | Admit: 2018-08-14 | Discharge: 2018-08-14 | Disposition: A | Discharge status: Home / Self Care | Payer: Medicare Other | Source: Ambulatory Visit | Attending: Hematology | Admitting: Hematology

## 2018-08-14 DIAGNOSIS — C250 Malignant neoplasm of head of pancreas: Secondary | ICD-10-CM | POA: Diagnosis not present

## 2018-08-14 DIAGNOSIS — C787 Secondary malignant neoplasm of liver and intrahepatic bile duct: Secondary | ICD-10-CM | POA: Diagnosis not present

## 2018-08-14 DIAGNOSIS — C259 Malignant neoplasm of pancreas, unspecified: Secondary | ICD-10-CM | POA: Diagnosis not present

## 2018-08-14 MED ORDER — SODIUM CHLORIDE (PF) 0.9 % IJ SOLN
INTRAMUSCULAR | Status: AC
  Filled 2018-08-14: qty 50

## 2018-08-14 MED ORDER — IOHEXOL 300 MG/ML  SOLN
100.0000 mL | Freq: Once | INTRAMUSCULAR | Status: AC | PRN
  Administered 2018-08-14: 100 mL via INTRAVENOUS

## 2018-08-15 ENCOUNTER — Other Ambulatory Visit: Payer: Self-pay | Admitting: Hematology

## 2018-08-16 NOTE — Progress Notes (Signed)
[place]   [phone] [phone]   Clinic Follow up Note   Patient Care Team: [name], MD as PCP - General (Family Medicine) Arta Silence, MD as Consulting Physician (Gastroenterology) Truitt Merle, MD as Consulting Physician (Hematology)  Date of Service:  08/19/2018  CHIEF COMPLAINT: F/u of pancreatic cancer  SUMMARY OF ONCOLOGIC HISTORY: Oncology History Overview Note  Cancer Staging Pancreatic cancer [facility]) Staging form: Exocrine Pancreas, AJCC 8th Edition - Clinical stage from 06/13/2017: Stage III (cT4, cN0, cM0) - Signed by Truitt Merle, MD on 06/13/2017     Pancreatic cancer ([place])  06/08/2017 Imaging   06/08/2017 CT Abdomen IMPRESSION: Irregular solid hypoechoic mass within the pancreatic head, 2.6 x 2.2 cm with associated pancreatic ductal dilatation and pancreatic atrophy, most compatible with pancreatic cancer. 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  Diffuse colonic diverticulosis. Slight stranding around the distal descending colon may reflect early active diverticulitis.  Bilateral renal parapelvic cysts.  Aortic atherosclerosis.   06/13/2017 Initial Diagnosis   Pancreatic cancer ([place])   06/13/2017 Cancer Staging   Staging form: Exocrine Pancreas, AJCC 8th Edition - Clinical stage from 06/13/2017: Stage III (cT4, cN0, cM0) - Signed by Truitt Merle, MD on 06/13/2017   06/13/2017 Procedure   EUS by Dr. Paulita Fujita 06/13/17  IMPRESSION:  -There was no sign of significant pathology in the ampulla. - There was no evidence of significant pathology in the left lobe of the liver. - A mass was identified in the pancreatic head. Abutment SMV; invasion portal confluence; no adenopathy. Fine needle aspiration performed. If this is a pancreatic adenocarcinoma, it would be staged T4/N0/Mx by EUS.   06/13/2017 Initial Biopsy   Diagnosis 06/13/17  FINE NEEDLE ASPIRATION, ENDOSCOPIC, PANCREAS HEAD (SPECIMEN 1 OF 1 COLLECTED 06/13/17): ADENOCARCINOMA. Preliminary Diagnosis Intraoperative Diagnosis: 1-3) ATYPICAL CELLS, ADDITIONAL MATERIAL REQUESTED. (NDK) 4-6 ADENOCARCINOMA. (NDK) Reported to Dr. Paulita Fujita on 06/13/17 @ 11:07am.    06/21/2017 Imaging   CT Chest without contrast IMPRESSION: 1. No findings suspicious for metastatic disease within the chest.  2. Tiny subpleural right lower lobe pulmonary nodule is nonspecific, but likely benign. This can be addressed on follow-up imaging.  3. Aortic Atherosclerosis (ICD10-I70.0).   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  The Hereditary Gene Panel offered by [facility] includes sequencing and/or deletion duplication testing of the following 47 genes: APC, ATM, AXIN2, BARD1, BMPR1A, BRCA1, BRCA2, BRIP1, CDH1, CDK4, CDKN2A (p14ARF), CDKN2A (p16INK4a), CHEK2, CTNNA1, DICER1, EPCAM (Deletion/duplication testing only), GREM1 (promoter region deletion/duplication testing only), KIT, MEN1, MLH1, MSH2, MSH3, MSH6, MUTYH, NBN, NF1, NHTL1, PALB2, PDGFRA, PMS2, POLD1, POLE, PTEN, RAD50, RAD51C, RAD51D, SDHB, SDHC, SDHD, SMAD4, SMARCA4. STK11, TP53, TSC1, TSC2, and VHL.  The following genes were evaluated for sequence changes only: SDHA and HOXB13 c.251G>A variant only. The report date is August 14, 2017.   09/24/2017 Progression   09/24/2017 CT CAP Pancreas: The mass involving the head of pancreas measures 2.5 x 3.8 by 3.1 cm. On the previous exam this measured 2.2 x 2.6 by 3.4 cm.   09/24/2017 Imaging   09/24/2017 CT CAP IMPRESSION: 1. There is been interval local progression of disease with increased size of head of pancreas neoplasm. There is progressive vascular involvement with partial encasement of the celiac trunk and SMA. Continued  encasement and marked  narrowing of the portal venous confluence. 2. No evidence for metastatic adenopathy within the abdomen, liver metastasis or pulmonary metastasis. 3. Aortic atherosclerosis and LAD coronary artery atherosclerotic calcifications. Aortic Atherosclerosis (ICD10-I70.0).   01/04/2018 Imaging   01/04/2018 CT CAP IMPRESSION: 1. No local disease progression identified. The pancreatic head mass is ill-defined and appears slightly smaller. There is chronic partial encasement of the celiac trunk and SMA and chronic short segment occlusion at the SMV/portal vein confluence. 2. No definite evidence of metastatic disease. Stable small hypervascular lesion inferiorly in the right hepatic lobe, likely an incidental vascular lesion. There is slightly greater mesenteric nodularity posterior to the transverse colon, but no other definite signs of peritoneal carcinomatosis. Attention on follow-up recommended. 3. Stable scattered tiny subpleural pulmonary nodules bilaterally, likely benign based on stability, although attention on follow-up recommended. 4. Mild splenomegaly. 5. Aortic Atherosclerosis (ICD10-I70.0). Port-A-Cath tip in the mid right atrium, stable.   04/02/2018 Imaging   CT AP  IMPRESSION: 1. Stable appearance of pancreatic head mass with chronic partial encasement of the celiac trunk and SMA and occlusion of the portal venous confluence. 2. No definite evidence for metastatic disease. Similar appearance of subtle soft tissue nodule posterior to the transverse colon without additional signs of peritoneal carcinomatosis. 3. Mild splenomegaly. 4.  Aortic Atherosclerosis (ICD10-I70.0).    08/14/2018 Imaging   CT CAP W Contrast 08/14/18    IMPRESSION: 1. Marked interval progression of the pancreatic head mass, now measuring up to 5.2 cm in maximum dimension (compared to 2.2 cm maximum dimension previously). The superior mesenteric vein and splenic vein are occluded centrally at the  portal splenic confluence. Tumor is contiguous with the posterior wall of the gastric antrum and medial wall of the duodenum, generating mass-effect on both. Tumor abuts both the distal celiac axis and SMA and both arteries remain patent. 2. Interval development of at least 3 hepatic metastases, measuring up to 3.0 cm in diameter. 3. Stable tiny bilateral pulmonary nodules, likely benign but attention on follow-up recommended. 4. Stable splenomegaly. 5.  Aortic Atherosclerois (ICD10-170.0)     08/23/2018 -  Chemotherapy   The patient had PACLitaxel-protein bound (ABRAXANE) chemo infusion 200 mg, 125 mg/m2 = 200 mg, Intravenous,  Once, 0 of 4 cycles gemcitabine (GEMZAR) 1,672 mg in sodium chloride 0.9 % 100 mL chemo infusion, 1,000 mg/m2 = 1,672 mg, Intravenous,  Once, 0 of 4 cycles  for chemotherapy treatment.       CURRENT THERAPY:  -chemo FOLFIRINOX every 2 weeksstarting 5/23/19with dose reduction due to thrombocytopenia.Further reduced to every 3 weeks starting 05/20/18 due to thrombocytopenia. She had chemo break 06/19/18-07/27/18.   -Promacta for thrombocytopenia, currently ay 71m daily. Increased to 1065mdaily on 05/06/18  INTERVAL HISTORY:  PeGERLENE GLASSBURNs here for a follow up and treatment. She presents to the clinic alone and is doing well. She reports intermittent LLQ abdominal pain, a few times weekly that resolves on its own. She has a good appetite, and weight is stable. She denies any neuropathy in her fingers or toes. She is agreeable to undergo molecular testing but is hesitant to undergo another invasive procedure, like a biopsy. She is hoping to make it to May 2021 for her granddaughters wedding.    REVIEW OF SYSTEMS:  Constitutional: Denies fevers, chills or abnormal weight loss Eyes: Denies blurriness of vision Ears, nose, mouth, throat, and face: Denies mucositis or sore throat Respiratory: Denies cough, dyspnea or wheezes Cardiovascular: Denies palpitation,  chest discomfort  or lower extremity swelling Gastrointestinal:  Denies nausea, heartburn or change in bowel habits (+) LLQ abdominal pain, intermittent Skin: Denies abnormal skin rashes Lymphatics: Denies new lymphadenopathy or easy bruising Neurological:Denies numbness, tingling or new weaknesses Behavioral/Psych: Mood is stable, no new changes  All other systems were reviewed with the patient and are negative.  MEDICAL HISTORY:  Past Medical History:  Diagnosis Date  . Allergic rhinitis    Skin Test 04-14-2009  . Asthma   . Cancer ([place]) 1981   adenocarcinoma of uterus  . Cancer ([place]) 2019   PANCREATIC   . Diabetes mellitus without complication ([place])    type II, no meds per pt  . Family history of breast cancer   . History of uterine cancer   . Hyperlipemia   . Hypertension   . Insomnia     SURGICAL HISTORY: Past Surgical History:  Procedure Laterality Date  . ABDOMINAL HYSTERECTOMY    . CARPAL TUNNEL RELEASE    . EUS N/A 06/13/2017   Procedure: FULL UPPER ENDOSCOPIC ULTRASOUND (EUS) RADIAL;  Surgeon: Arta Silence, MD;  Location: [facility];  Service: Endoscopy;  Laterality: N/A;  . I&D EXTREMITY  09/17/2011   Procedure: IRRIGATION AND DEBRIDEMENT EXTREMITY;  Surgeon: Roseanne Kaufman, MD;  Location: [facility];  Service: Orthopedics;  Laterality: Left;  with drain placement  . IR FLUORO GUIDE PORT INSERTION RIGHT  07/18/2017  . IR US GUIDE VASC ACCESS RIGHT  07/18/2017  . NASAL SEPTOPLASTY W/ TURBINOPLASTY    . pelvic floor rebuild    . TONSILLECTOMY    . TOTAL ABDOMINAL HYSTERECTOMY W/ BILATERAL SALPINGOOPHORECTOMY      I have reviewed the social history and family history with the patient and they are unchanged from previous note.  ALLERGIES:  is allergic to cephalexin and nabumetone.  MEDICATIONS:  Current Outpatient Medications  Medication Sig Dispense Refill  . ALPRAZolam (XANAX) 0.5 MG tablet Take 1 tablet (0.5 mg total) by mouth at bedtime as needed for anxiety.  90 tablet 0  . Ascorbic Acid (VITAMIN C) 500 MG PACK Take 100 mg by mouth daily.    Marland Kitchen atorvastatin (LIPITOR) 10 MG tablet Take 10 mg by mouth at bedtime.     Marland Kitchen CALCIUM PO Take 1 tablet by mouth daily.     . Cholecalciferol (VITAMIN D) 2000 units CAPS Take by mouth.    . Cyanocobalamin (VITAMIN B-12 PO) Take 1 tablet by mouth daily.     . hydrochlorothiazide (HYDRODIURIL) 25 MG tablet Take 1 tablet by mouth daily as needed.     . hyoscyamine (LEVBID) 0.375 MG 12 hr tablet Take 0.375 mg by mouth every 12 (twelve) hours as needed for cramping.     Marland Kitchen levothyroxine (SYNTHROID, LEVOTHROID) 25 MCG tablet Take 25 mcg by mouth daily before breakfast.     . magic mouthwash w/lidocaine SOLN Take 5 mLs by mouth 3 (three) times daily as needed for mouth pain. 240 mL 0  . metFORMIN (GLUCOPHAGE) 500 MG tablet TAKE 1 TABLET BY MOUTH EVERY DAY WITH BREAKFAST 30 tablet 1  . Omega-3 Fatty Acids (FISH OIL PO) Take 1 capsule by mouth daily.     Marland Kitchen omeprazole (PRILOSEC) 20 MG capsule Take 1 tablet by mouth daily.    Glory Rosebush DELICA LANCETS 14H MISC USE TO TEST YOUR BLOOD SUGAR ONCE A DAY FASTING AND 2 HRS AFTER A MEAL AS NEEDED  3  . ONETOUCH VERIO test strip USE TO TEST YOUR BLOOD SUGAR ONCE A DAY FASTING &  2 HRS AFTER A MEAL AS NEEDED  3  . potassium chloride SA (KLOR-CON M20) 20 MEQ tablet Take 1 tablet (20 mEq total) by mouth 2 (two) times daily. (Patient taking differently: Take 20 mEq by mouth daily. ) 60 tablet 1  . PROMACTA 50 MG tablet TAKE 2 TABLETS (100 MG) BY MOUTH EVERY DAY ON AN EMPTY STOMACH, 1 HOUR BEFORE OR 2 HOURS AFTER A MEAL 60 tablet 0  . ROCKLATAN 0.02-0.005 % SOLN PLACE 1 DROP INTO BOTH EYES AT BEDTIME  12  . traZODone (DESYREL) 100 MG tablet TAKE 1 TABLET BY MOUTH EVERYDAY AT BEDTIME 90 tablet 1  . diphenoxylate-atropine (LOMOTIL) 2.5-0.025 MG tablet Take 1-2 tablets by mouth 4 (four) times daily as needed for diarrhea or loose stools. (Patient not taking: Reported on 08/19/2018) 60 tablet 1  .  HYDROcodone-acetaminophen (NORCO) 5-325 MG tablet Take 1 tablet by mouth every 6 (six) hours as needed for moderate pain. (Patient not taking: Reported on 08/19/2018) 40 tablet 0   No current facility-administered medications for this visit.     PHYSICAL EXAMINATION: ECOG PERFORMANCE STATUS: 1 - Symptomatic but completely ambulatory  Vitals:   08/19/18 1006  BP: (!) 146/71  Pulse: 87  Resp: 18  Temp: 98.5 F (36.9 C)  SpO2: 99%   Filed Weights   08/19/18 1006  Weight: 139 lb 12.8 oz (63.4 kg)    GENERAL:alert, no distress and comfortable SKIN: skin color, texture, turgor are normal, no rashes or significant lesions EYES: normal, Conjunctiva are pink and non-injected, sclera clear OROPHARYNX:no exudate, no erythema and lips, buccal mucosa, and tongue normal NECK: supple, thyroid normal size, non-tender, without nodularity LYMPH:  no palpable lymphadenopathy in the cervical, axillary  LUNGS: clear to auscultation and percussion with normal breathing effort HEART: regular rate & rhythm and no murmurs and no lower extremity edema ABDOMEN:abdomen soft, non-tender and normal bowel sounds Musculoskeletal:no cyanosis of digits and no clubbing  NEURO: alert & oriented x 3 with fluent speech, no focal motor/sensory deficits  LABORATORY DATA:  I have reviewed the data as listed CBC Latest Ref Rng & Units 08/19/2018 07/29/2018 06/17/2018  WBC 4.0 - 10.5 K/uL 7.5 5.1 5.8  Hemoglobin 12.0 - 15.0 g/dL 11.3(L) 11.0(L) 9.6(L)  Hematocrit 36.0 - 46.0 % 33.9(L) 33.2(L) 28.2(L)  Platelets 150 - 400 K/uL 185 108(L) 121(L)     CMP Latest Ref Rng & Units 08/19/2018 07/29/2018 06/17/2018  Glucose 70 - 99 mg/dL 206(H) 159(H) 183(H)  BUN 8 - 23 mg/dL '11 13 11  ' Creatinine 0.44 - 1.00 mg/dL 0.91 0.78 0.68  Sodium 135 - 145 mmol/L 138 139 139  Potassium 3.5 - 5.1 mmol/L 4.5 4.4 4.3  Chloride 98 - 111 mmol/L 103 104 105  CO2 22 - 32 mmol/L '26 28 25  ' Calcium 8.9 - 10.3 mg/dL 9.6 9.4 9.2  Total Protein  6.5 - 8.1 g/dL 7.2 6.9 6.6  Total Bilirubin 0.3 - 1.2 mg/dL 0.4 0.5 0.5  Alkaline Phos 38 - 126 U/L 206(H) 193(H) 225(H)  AST 15 - 41 U/L 24 19 35  ALT 0 - 44 U/L '15 18 28      ' RADIOGRAPHIC STUDIES: I have personally reviewed the radiological images as listed and agreed with the findings in the report. No results found.   ASSESSMENT & PLAN:  Carly [name] is a 78 y.o. 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 Unfortunately this is unresectable tumor. -She has been on first lineChemo FOLFIRINOXsince 5/23/19with dose reduction due to cytopenia, andPromactafor chemo related thrombocytopenia. Toleratingchemo verywell. Reduced to every 3 weeks given prolonged blood count recovery. -She[facility]viously[facility] Gennaro Africa her tumor was not resectable.He encouraged her to continue with current chemo treatment for as long as possible. -I personally reviewed her CT CAP from 08/14/18 images with pt in detail, which shows interval progression of pancreatic mass measuring 5.2cm (previoulsy 2.2cm), development of 3 hepatic metastases 3.0cm in diameter which are highly specious for liver metastasis, stable splenomegaly, and stable tiny bilateral pulmonary nodules, likely benign. I discussed that unfortunately her cancer has progressed and her overall prognosis is poor due to the incurable nature of her cancer. I spoke with her daughter in law Nehemiah Settle who is her care giver.  -I will stop her current chemo regiment due to disease progression.  I discussed second line therapy, I recommend  gemcitabine and Abraxane weekly, 2 weeks on and one-week off.  Potential benefit and side effects discussed with her, especially cytopenias, risk of infection, alopecia, neuropathy, liver and renal dysfunction, etc., she agrees to proceed. -Labs reviewed, CBC and CMP WNL except Hg 11.3, HCT 33.9, MCV 100.6, blood glucose 206, alkaline phosphatase 206. Overall adequate for chemo.  We will start second line chemotherapy next week after insurance approval. -will see her back on cycle 1 day 8 -We will continue Promacta due to the risk of worsening thrombocytopenia from chemo  2. DM -Continue metformin. -BG at206 today (08/19/18)  3. Diarrhea, abdominal pain, and weight loss -secondary to cancer and chemo, improved  -Pain previously resolved,recurred lately but overall mild. Diarrhea controlledwith chemo reduction.  -During latest chemo break she had constipation. With a little miralx she developed diarrhea shortly.   4. H/oEndometrialCancer, Stage I, diagnosed in 1981 -Treated with hysterectomy and BSO in 1981. -f/u with OB/GYN and continue annual Pap smears. -Genetics was negative  5.Hypokalemia -Currently on Potassium 58mq daily, will continue -Kremains normal lately.  6.HTN, Hypothyroidism -f/u with PCP -BP at146/71 today (08/19/18)  7.Chemo induced thrombocytopenia -Improved with Promacta738mnd chemo dose reduction. -IncreasedPromacta on 05/05/28 due to decreased level and reduced chemo to every 3 weeks starting 05/20/18. -PLT 185,000 today (08/19/18). -Continue Promacta at 10072maily.  8.Goal of care discussion, DNR/DNI -Patient understands her cancer treatment is palliative, to prolong her life. -She has a living will. Code status and life-saving measures were discussed, and she wishes to be DNR now.   9. 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 -If she develops pain or  tingling I will recommend Gabapentin for management. -Currently resolved.   Plan -[place] -Lab, flush, f/u and chemo GEMCITABINE and ABRAXANE in 1 week, f/u in 2 weeks, will reduced chemo dose for cycle 1 due to her advanced age and previous cytopenia from chemo   -Continue Promacta 100m26mily  No problem-specific Assessment & Plan notes found for this encounter.   No orders of the defined types were placed in this encounter.  All questions were answered. The patient knows to call the clinic with any problems, questions or concerns. No barriers to learning was detected. I spent 30 minutes counseling  the patient face to face. The total time spent in the appointment was 40 minutes and more than 50% was on counseling and review of test results     Truitt Merle, MD 08/19/2018   I, Cloyde Reams Dorshimer, am acting as scribe for Truitt Merle, MD.   I have reviewed the above documentation for accuracy and completeness, and I agree with the above.

## 2018-08-19 ENCOUNTER — Inpatient Hospital Stay: Payer: Medicare Other

## 2018-08-19 ENCOUNTER — Inpatient Hospital Stay (HOSPITAL_BASED_OUTPATIENT_CLINIC_OR_DEPARTMENT_OTHER): Payer: Medicare Other | Admitting: Hematology

## 2018-08-19 ENCOUNTER — Other Ambulatory Visit: Payer: Self-pay

## 2018-08-19 ENCOUNTER — Encounter: Payer: Self-pay | Admitting: Hematology

## 2018-08-19 VITALS — BP 146/71 | HR 87 | Temp 98.5°F | Resp 18 | Ht 62.0 in | Wt 139.8 lb

## 2018-08-19 DIAGNOSIS — Z8542 Personal history of malignant neoplasm of other parts of uterus: Secondary | ICD-10-CM

## 2018-08-19 DIAGNOSIS — C250 Malignant neoplasm of head of pancreas: Secondary | ICD-10-CM

## 2018-08-19 DIAGNOSIS — D6959 Other secondary thrombocytopenia: Secondary | ICD-10-CM | POA: Diagnosis not present

## 2018-08-19 DIAGNOSIS — I10 Essential (primary) hypertension: Secondary | ICD-10-CM | POA: Diagnosis not present

## 2018-08-19 DIAGNOSIS — E039 Hypothyroidism, unspecified: Secondary | ICD-10-CM

## 2018-08-19 DIAGNOSIS — Z5111 Encounter for antineoplastic chemotherapy: Secondary | ICD-10-CM | POA: Diagnosis not present

## 2018-08-19 DIAGNOSIS — E876 Hypokalemia: Secondary | ICD-10-CM

## 2018-08-19 DIAGNOSIS — C787 Secondary malignant neoplasm of liver and intrahepatic bile duct: Secondary | ICD-10-CM | POA: Diagnosis not present

## 2018-08-19 DIAGNOSIS — Z95828 Presence of other vascular implants and grafts: Secondary | ICD-10-CM

## 2018-08-19 LAB — CBC WITH DIFFERENTIAL (CANCER CENTER ONLY)
Abs Immature Granulocytes: 0.04 10*3/uL (ref 0.00–0.07)
Basophils Absolute: 0 10*3/uL (ref 0.0–0.1)
Basophils Relative: 0 %
Eosinophils Absolute: 0.1 10*3/uL (ref 0.0–0.5)
Eosinophils Relative: 1 %
HCT: 33.9 % — ABNORMAL LOW (ref 36.0–46.0)
Hemoglobin: 11.3 g/dL — ABNORMAL LOW (ref 12.0–15.0)
Immature Granulocytes: 1 %
Lymphocytes Relative: 8 %
Lymphs Abs: 0.6 10*3/uL — ABNORMAL LOW (ref 0.7–4.0)
MCH: 33.5 pg (ref 26.0–34.0)
MCHC: 33.3 g/dL (ref 30.0–36.0)
MCV: 100.6 fL — ABNORMAL HIGH (ref 80.0–100.0)
Monocytes Absolute: 0.8 10*3/uL (ref 0.1–1.0)
Monocytes Relative: 10 %
Neutro Abs: 6 10*3/uL (ref 1.7–7.7)
Neutrophils Relative %: 80 %
Platelet Count: 185 10*3/uL (ref 150–400)
RBC: 3.37 MIL/uL — ABNORMAL LOW (ref 3.87–5.11)
RDW: 13.2 % (ref 11.5–15.5)
WBC Count: 7.5 10*3/uL (ref 4.0–10.5)
nRBC: 0 % (ref 0.0–0.2)

## 2018-08-19 LAB — CMP (CANCER CENTER ONLY)
ALT: 15 U/L (ref 0–44)
AST: 24 U/L (ref 15–41)
Albumin: 3.5 g/dL (ref 3.5–5.0)
Alkaline Phosphatase: 206 U/L — ABNORMAL HIGH (ref 38–126)
Anion gap: 9 (ref 5–15)
BUN: 11 mg/dL (ref 8–23)
CO2: 26 mmol/L (ref 22–32)
Calcium: 9.6 mg/dL (ref 8.9–10.3)
Chloride: 103 mmol/L (ref 98–111)
Creatinine: 0.91 mg/dL (ref 0.44–1.00)
GFR, Est AFR Am: >60 mL/min (ref >60)
GFR, Estimated: >60 mL/min (ref >60)
Glucose, Bld: 206 mg/dL — ABNORMAL HIGH (ref 70–99)
Potassium: 4.5 mmol/L (ref 3.5–5.1)
Sodium: 138 mmol/L (ref 135–145)
Total Bilirubin: 0.4 mg/dL (ref 0.3–1.2)
Total Protein: 7.2 g/dL (ref 6.5–8.1)

## 2018-08-19 MED ORDER — SODIUM CHLORIDE 0.9% FLUSH
10.0000 mL | Freq: Once | INTRAVENOUS | Status: AC
  Administered 2018-08-19: 10 mL
  Filled 2018-08-19: qty 10

## 2018-08-19 MED ORDER — HEPARIN SOD (PORK) LOCK FLUSH 100 UNIT/ML IV SOLN
500.0000 [IU] | Freq: Once | INTRAVENOUS | Status: DC | PRN
  Filled 2018-08-19: qty 5

## 2018-08-19 MED ORDER — HEPARIN SOD (PORK) LOCK FLUSH 100 UNIT/ML IV SOLN
500.0000 [IU] | Freq: Once | INTRAVENOUS | Status: AC
  Administered 2018-08-19: 500 [IU] via INTRAVENOUS
  Filled 2018-08-19: qty 5

## 2018-08-19 MED ORDER — SODIUM CHLORIDE 0.9% FLUSH
10.0000 mL | INTRAVENOUS | Status: DC | PRN
  Administered 2018-08-19: 10:00:00 10 mL
  Filled 2018-08-19: qty 10

## 2018-08-19 NOTE — Patient Instructions (Signed)

## 2018-08-19 NOTE — Progress Notes (Signed)
DISCONTINUE ON PATHWAY REGIMEN - Pancreatic     A cycle is every 14 days:     Oxaliplatin      Leucovorin      Irinotecan      5-Fluorouracil      5-Fluorouracil   **Always confirm dose/schedule in your pharmacy ordering system**  REASON: Disease Progression PRIOR TREATMENT: PANOS56: FOLFIRINOX q14 Days x 4 Cycles TREATMENT RESPONSE: Partial Response (PR)  START OFF PATHWAY REGIMEN - Pancreatic Adenocarcinoma   [id number]:Nab-Paclitaxel (Abraxane) 125 mg/m2 D1, 8, 15 + Gemcitabine 1,000 mg/m2 D1, 8, 15 q28 Days:   A cycle is every 28 days:     Nab-paclitaxel (protein bound)      Gemcitabine   **Always confirm dose/schedule in your pharmacy ordering system**  Patient Characteristics: Metastatic Disease, Maintenance Therapy, BRCA1/2 Mutation Absent/Unknown Current evidence of distant metastases<= Yes AJCC T Category: T4 AJCC N Category: N0 AJCC M Category: M0 AJCC 8 Stage Grouping: III Line of Therapy: Maintenance Therapy BRCA1/2 Mutation Status: Absent Intent of Therapy: Non-Curative / Palliative Intent, Discussed with Patient

## 2018-08-20 LAB — CANCER ANTIGEN 19-9: CA 19-9: 36 U/mL — ABNORMAL HIGH (ref 0–35)

## 2018-08-21 ENCOUNTER — Other Ambulatory Visit: Payer: Self-pay | Admitting: Hematology

## 2018-08-21 DIAGNOSIS — C250 Malignant neoplasm of head of pancreas: Secondary | ICD-10-CM

## 2018-08-26 ENCOUNTER — Inpatient Hospital Stay: Payer: Medicare Other

## 2018-08-26 ENCOUNTER — Other Ambulatory Visit: Payer: Self-pay

## 2018-08-26 ENCOUNTER — Other Ambulatory Visit: Payer: Self-pay | Admitting: Hematology

## 2018-08-26 VITALS — BP 142/63 | HR 94 | Temp 99.1°F | Resp 16 | Ht 62.0 in | Wt 138.5 lb

## 2018-08-26 DIAGNOSIS — C250 Malignant neoplasm of head of pancreas: Secondary | ICD-10-CM | POA: Diagnosis not present

## 2018-08-26 DIAGNOSIS — I10 Essential (primary) hypertension: Secondary | ICD-10-CM | POA: Diagnosis not present

## 2018-08-26 DIAGNOSIS — Z5111 Encounter for antineoplastic chemotherapy: Secondary | ICD-10-CM | POA: Diagnosis not present

## 2018-08-26 DIAGNOSIS — Z95828 Presence of other vascular implants and grafts: Secondary | ICD-10-CM

## 2018-08-26 DIAGNOSIS — C787 Secondary malignant neoplasm of liver and intrahepatic bile duct: Secondary | ICD-10-CM | POA: Diagnosis not present

## 2018-08-26 DIAGNOSIS — D6959 Other secondary thrombocytopenia: Secondary | ICD-10-CM | POA: Diagnosis not present

## 2018-08-26 DIAGNOSIS — Z7189 Other specified counseling: Secondary | ICD-10-CM

## 2018-08-26 DIAGNOSIS — E876 Hypokalemia: Secondary | ICD-10-CM | POA: Diagnosis not present

## 2018-08-26 LAB — CBC WITH DIFFERENTIAL (CANCER CENTER ONLY)
Abs Immature Granulocytes: 0.01 10*3/uL (ref 0.00–0.07)
Basophils Absolute: 0 10*3/uL (ref 0.0–0.1)
Basophils Relative: 0 %
Eosinophils Absolute: 0 10*3/uL (ref 0.0–0.5)
Eosinophils Relative: 0 %
HCT: 33.6 % — ABNORMAL LOW (ref 36.0–46.0)
Hemoglobin: 11.2 g/dL — ABNORMAL LOW (ref 12.0–15.0)
Immature Granulocytes: 0 %
Lymphocytes Relative: 8 %
Lymphs Abs: 0.5 10*3/uL — ABNORMAL LOW (ref 0.7–4.0)
MCH: 33.1 pg (ref 26.0–34.0)
MCHC: 33.3 g/dL (ref 30.0–36.0)
MCV: 99.4 fL (ref 80.0–100.0)
Monocytes Absolute: 0.6 10*3/uL (ref 0.1–1.0)
Monocytes Relative: 10 %
Neutro Abs: 4.9 10*3/uL (ref 1.7–7.7)
Neutrophils Relative %: 82 %
Platelet Count: 205 10*3/uL (ref 150–400)
RBC: 3.38 MIL/uL — ABNORMAL LOW (ref 3.87–5.11)
RDW: 13.1 % (ref 11.5–15.5)
WBC Count: 6.1 10*3/uL (ref 4.0–10.5)
nRBC: 0 % (ref 0.0–0.2)

## 2018-08-26 LAB — CMP (CANCER CENTER ONLY)
ALT: 155 U/L — ABNORMAL HIGH (ref 0–44)
AST: 155 U/L — ABNORMAL HIGH (ref 15–41)
Albumin: 3.3 g/dL — ABNORMAL LOW (ref 3.5–5.0)
Alkaline Phosphatase: 458 U/L — ABNORMAL HIGH (ref 38–126)
Anion gap: 9 (ref 5–15)
BUN: 11 mg/dL (ref 8–23)
CO2: 27 mmol/L (ref 22–32)
Calcium: 9.4 mg/dL (ref 8.9–10.3)
Chloride: 101 mmol/L (ref 98–111)
Creatinine: 0.75 mg/dL (ref 0.44–1.00)
GFR, Est AFR Am: >60 mL/min (ref >60)
GFR, Estimated: >60 mL/min (ref >60)
Glucose, Bld: 261 mg/dL — ABNORMAL HIGH (ref 70–99)
Potassium: 4.3 mmol/L (ref 3.5–5.1)
Sodium: 137 mmol/L (ref 135–145)
Total Bilirubin: 1.2 mg/dL (ref 0.3–1.2)
Total Protein: 7.1 g/dL (ref 6.5–8.1)

## 2018-08-26 MED ORDER — SODIUM CHLORIDE 0.9% FLUSH
10.0000 mL | INTRAVENOUS | Status: DC | PRN
  Administered 2018-08-26: 10 mL
  Filled 2018-08-26: qty 10

## 2018-08-26 MED ORDER — PACLITAXEL PROTEIN-BOUND CHEMO INJECTION 100 MG
100.0000 mg/m2 | Freq: Once | INTRAVENOUS | Status: AC
  Administered 2018-08-26: 15:00:00 175 mg via INTRAVENOUS
  Filled 2018-08-26: qty 35

## 2018-08-26 MED ORDER — SODIUM CHLORIDE 0.9 % IV SOLN
Freq: Once | INTRAVENOUS | Status: AC
  Administered 2018-08-26: 13:00:00 via INTRAVENOUS
  Filled 2018-08-26: qty 250

## 2018-08-26 MED ORDER — PROCHLORPERAZINE MALEATE 10 MG PO TABS
ORAL_TABLET | ORAL | Status: AC
  Filled 2018-08-26: qty 1

## 2018-08-26 MED ORDER — ELTROMBOPAG OLAMINE 50 MG PO TABS: ORAL_TABLET | ORAL | 0 refills | Status: DC

## 2018-08-26 MED ORDER — HEPARIN SOD (PORK) LOCK FLUSH 100 UNIT/ML IV SOLN
500.0000 [IU] | Freq: Once | INTRAVENOUS | Status: AC | PRN
  Administered 2018-08-26: 500 [IU]
  Filled 2018-08-26: qty 5

## 2018-08-26 MED ORDER — SODIUM CHLORIDE 0.9 % IV SOLN
800.0000 mg/m2 | Freq: Once | INTRAVENOUS | Status: AC
  Administered 2018-08-26: 1330 mg via INTRAVENOUS
  Filled 2018-08-26: qty 34.98

## 2018-08-26 MED ORDER — PROCHLORPERAZINE MALEATE 10 MG PO TABS
10.0000 mg | ORAL_TABLET | Freq: Once | ORAL | Status: AC
  Administered 2018-08-26: 10 mg via ORAL

## 2018-08-26 NOTE — Progress Notes (Signed)
Per Dr Burr Medico may proceed with D1C1 Abraxane Gemzar with ALT 155, AST 155, and Alk Phos 458

## 2018-08-26 NOTE — Patient Instructions (Signed)
[facility] Discharge Instructions for Patients Receiving Chemotherapy  Today you received the following chemotherapy agents:  Abraxane (protein bound paclitaxel), Gemzar (gemcitibine)  To help prevent nausea and vomiting after your treatment, we encourage you to take your nausea medication as prescribed.   If you develop nausea and vomiting that is not controlled by your nausea medication, call the clinic.   BELOW ARE SYMPTOMS THAT SHOULD BE REPORTED IMMEDIATELY:  *FEVER GREATER THAN 100.5 F  *CHILLS WITH OR WITHOUT FEVER  NAUSEA AND VOMITING THAT IS NOT CONTROLLED WITH YOUR NAUSEA MEDICATION  *UNUSUAL SHORTNESS OF BREATH  *UNUSUAL BRUISING OR BLEEDING  TENDERNESS IN MOUTH AND THROAT WITH OR WITHOUT PRESENCE OF ULCERS  *URINARY PROBLEMS  *BOWEL PROBLEMS  UNUSUAL RASH Items with * indicate a potential emergency and should be followed up as soon as possible.  Feel free to call the clinic should you have any questions or concerns. The clinic phone number is [phone].  Please show the [facility] at check-in to the Emergency Department and triage nurse.  Coronavirus (COVID-19) Are you at risk?  Are you at risk for the Coronavirus (COVID-19)?  To be considered HIGH RISK for Coronavirus (COVID-19), you have to meet the following criteria:  . Traveled to [place], [place], [place], [place] or [place]; or in the [place] to [place], [place], [place], or [place]; and have fever, cough, and shortness of breath within the last 2 weeks of travel OR . Been in close contact with a person diagnosed with COVID-19 within the last 2 weeks and have fever, cough, and shortness of breath . IF YOU DO NOT MEET THESE CRITERIA, YOU ARE CONSIDERED LOW RISK FOR COVID-19.  What to do if you are HIGH RISK for COVID-19?  Marland Kitchen If you are having a medical emergency, call 911. . Seek medical care right away. Before you go to a doctor's office, urgent care or emergency  department, call ahead and tell them about your recent travel, contact with someone diagnosed with COVID-19, and your symptoms. You should receive instructions from your physician's office regarding next steps of care.  . When you arrive at healthcare provider, tell the healthcare staff immediately you have returned from visiting [place], [place], [place], [place] or [place]; or traveled in the [place] to [place], [place], [place], or [place]; in the last two weeks or you have been in close contact with a person diagnosed with COVID-19 in the last 2 weeks.   . Tell the health care staff about your symptoms: fever, cough and shortness of breath. . After you have been seen by a medical provider, you will be either: o Tested for (COVID-19) and discharged home on quarantine except to seek medical care if symptoms worsen, and asked to  - Stay home and avoid contact with others until you get your results (4-5 days)  - Avoid travel on public transportation if possible (such as bus, train, or airplane) or o Sent to the Emergency Department by EMS for evaluation, COVID-19 testing, and possible admission depending on your condition and test results.  What to do if you are LOW RISK for COVID-19?  Reduce your risk of any infection by using the same precautions used for avoiding the common cold or flu:  Marland Kitchen Wash your hands often with soap and warm water for at least 20 seconds.  If soap and water are not readily available, use an alcohol-based hand sanitizer with at least 60% alcohol.  Marland Kitchen  If coughing or sneezing, cover your mouth and nose by coughing or sneezing into the elbow areas of your shirt or coat, into a tissue or into your sleeve (not your hands). . Avoid shaking hands with others and consider head nods or verbal greetings only. . Avoid touching your eyes, nose, or mouth with unwashed hands.  . Avoid close contact with people who are sick. . Avoid places or events with large numbers of people  in one location, like concerts or sporting events. . Carefully consider travel plans you have or are making. . If you are planning any travel outside or inside the [place], visit the CDC's Travelers' Health webpage for the latest health notices. . If you have some symptoms but not all symptoms, continue to monitor at home and seek medical attention if your symptoms worsen. . If you are having a medical emergency, call 911.   [place] / e-Visit: [email or web address]         [facility]: [facility]: [phone]                   [facility]: [phone]

## 2018-08-28 ENCOUNTER — Telehealth: Payer: Self-pay | Admitting: *Deleted

## 2018-08-28 NOTE — Telephone Encounter (Signed)
Return call received from [name] for chemotherapy F/U.  "I am not as perky as I was with the other treatment.  Today I am better but still not as perky.  Monday night I came home and my nose started running, sneezing and I vomited two times.  It looked like what I'd eaten.  No nausea or further vomiting since Monday.  I drink maybe 16 oz water daily. I am up at night to take Synthroid and Promacta on an empty stomach."  Denies any new side effects or symptoms.  Bowel and bladder functioning well.  Eating and drinking well.  Instructed to drink 64 oz minimum daily or at least the day before, of and after treatment.   Denies questions or needs at this time.  Encouraged to call [phone] Mon -Fri 8:00 am - 4:30 pm or anytime as needed for symptoms, changes or event outside office hours.

## 2018-08-28 NOTE — Telephone Encounter (Signed)
[place] at [phone].  Message left requesting a return call for chemotherapy follow up.  Awaiting return call from patient.

## 2018-08-28 NOTE — Telephone Encounter (Signed)
-----   Message from Paulla Dolly, RN sent at 08/26/2018  4:02 PM EDT ----- Regarding: Dr. Burr Medico first time chemo follow up call Pt of Dr. Burr Medico first time abraxane and gemzar.  Pt tolerated treatment well.

## 2018-09-02 ENCOUNTER — Encounter: Payer: Self-pay | Admitting: Hematology

## 2018-09-02 ENCOUNTER — Other Ambulatory Visit: Payer: Self-pay

## 2018-09-02 ENCOUNTER — Inpatient Hospital Stay: Payer: Medicare Other

## 2018-09-02 ENCOUNTER — Telehealth: Payer: Self-pay | Admitting: Nurse Practitioner

## 2018-09-02 ENCOUNTER — Encounter: Payer: Self-pay | Admitting: Nurse Practitioner

## 2018-09-02 ENCOUNTER — Inpatient Hospital Stay: Payer: Medicare Other | Attending: Hematology

## 2018-09-02 ENCOUNTER — Inpatient Hospital Stay (HOSPITAL_BASED_OUTPATIENT_CLINIC_OR_DEPARTMENT_OTHER): Payer: Medicare Other | Admitting: Nurse Practitioner

## 2018-09-02 VITALS — BP 145/77 | HR 95 | Temp 98.5°F | Resp 18 | Ht 62.0 in | Wt 138.1 lb

## 2018-09-02 DIAGNOSIS — G629 Polyneuropathy, unspecified: Secondary | ICD-10-CM | POA: Diagnosis not present

## 2018-09-02 DIAGNOSIS — Z8542 Personal history of malignant neoplasm of other parts of uterus: Secondary | ICD-10-CM | POA: Insufficient documentation

## 2018-09-02 DIAGNOSIS — C250 Malignant neoplasm of head of pancreas: Secondary | ICD-10-CM | POA: Insufficient documentation

## 2018-09-02 DIAGNOSIS — Z5111 Encounter for antineoplastic chemotherapy: Secondary | ICD-10-CM | POA: Diagnosis not present

## 2018-09-02 DIAGNOSIS — D6959 Other secondary thrombocytopenia: Secondary | ICD-10-CM

## 2018-09-02 DIAGNOSIS — E119 Type 2 diabetes mellitus without complications: Secondary | ICD-10-CM

## 2018-09-02 DIAGNOSIS — E039 Hypothyroidism, unspecified: Secondary | ICD-10-CM

## 2018-09-02 DIAGNOSIS — K831 Obstruction of bile duct: Secondary | ICD-10-CM | POA: Diagnosis not present

## 2018-09-02 DIAGNOSIS — Z452 Encounter for adjustment and management of vascular access device: Secondary | ICD-10-CM | POA: Diagnosis not present

## 2018-09-02 DIAGNOSIS — C259 Malignant neoplasm of pancreas, unspecified: Secondary | ICD-10-CM | POA: Diagnosis not present

## 2018-09-02 DIAGNOSIS — C787 Secondary malignant neoplasm of liver and intrahepatic bile duct: Secondary | ICD-10-CM | POA: Diagnosis not present

## 2018-09-02 DIAGNOSIS — I10 Essential (primary) hypertension: Secondary | ICD-10-CM

## 2018-09-02 DIAGNOSIS — Z95828 Presence of other vascular implants and grafts: Secondary | ICD-10-CM

## 2018-09-02 DIAGNOSIS — E876 Hypokalemia: Secondary | ICD-10-CM | POA: Diagnosis not present

## 2018-09-02 LAB — CMP (CANCER CENTER ONLY)
ALT: 46 U/L — ABNORMAL HIGH (ref 0–44)
AST: 46 U/L — ABNORMAL HIGH (ref 15–41)
Albumin: 2.5 g/dL — ABNORMAL LOW (ref 3.5–5.0)
Alkaline Phosphatase: 493 U/L — ABNORMAL HIGH (ref 38–126)
Anion gap: 9 (ref 5–15)
BUN: 10 mg/dL (ref 8–23)
CO2: 26 mmol/L (ref 22–32)
Calcium: 8.9 mg/dL (ref 8.9–10.3)
Chloride: 100 mmol/L (ref 98–111)
Creatinine: 0.73 mg/dL (ref 0.44–1.00)
GFR, Est AFR Am: >60 mL/min (ref >60)
GFR, Estimated: >60 mL/min (ref >60)
Glucose, Bld: 212 mg/dL — ABNORMAL HIGH (ref 70–99)
Potassium: 4.5 mmol/L (ref 3.5–5.1)
Sodium: 135 mmol/L (ref 135–145)
Total Bilirubin: 3.3 mg/dL — ABNORMAL HIGH (ref 0.3–1.2)
Total Protein: 6.6 g/dL (ref 6.5–8.1)

## 2018-09-02 LAB — CBC WITH DIFFERENTIAL (CANCER CENTER ONLY)
Abs Immature Granulocytes: 0.03 10*3/uL (ref 0.00–0.07)
Basophils Absolute: 0 10*3/uL (ref 0.0–0.1)
Basophils Relative: 0 %
Eosinophils Absolute: 0 10*3/uL (ref 0.0–0.5)
Eosinophils Relative: 1 %
HCT: 29.2 % — ABNORMAL LOW (ref 36.0–46.0)
Hemoglobin: 9.8 g/dL — ABNORMAL LOW (ref 12.0–15.0)
Immature Granulocytes: 1 %
Lymphocytes Relative: 10 %
Lymphs Abs: 0.4 10*3/uL — ABNORMAL LOW (ref 0.7–4.0)
MCH: 32.5 pg (ref 26.0–34.0)
MCHC: 33.6 g/dL (ref 30.0–36.0)
MCV: 96.7 fL (ref 80.0–100.0)
Monocytes Absolute: 0.3 10*3/uL (ref 0.1–1.0)
Monocytes Relative: 8 %
Neutro Abs: 3.4 10*3/uL (ref 1.7–7.7)
Neutrophils Relative %: 80 %
Platelet Count: 54 10*3/uL — ABNORMAL LOW (ref 150–400)
RBC: 3.02 MIL/uL — ABNORMAL LOW (ref 3.87–5.11)
RDW: 13.3 % (ref 11.5–15.5)
WBC Count: 4.2 10*3/uL (ref 4.0–10.5)
nRBC: 0 % (ref 0.0–0.2)

## 2018-09-02 MED ORDER — ONDANSETRON HCL 8 MG PO TABS: 8.0000 mg | ORAL_TABLET | Freq: Three times a day (TID) | ORAL | 1 refills | Status: AC | PRN

## 2018-09-02 MED ORDER — LIDOCAINE-PRILOCAINE 2.5-2.5 % EX CREA: 1.0000 "application " | TOPICAL_CREAM | CUTANEOUS | 2 refills | Status: DC | PRN

## 2018-09-02 MED ORDER — PROCHLORPERAZINE MALEATE 10 MG PO TABS: 10.0000 mg | ORAL_TABLET | Freq: Four times a day (QID) | ORAL | 1 refills | Status: AC | PRN

## 2018-09-02 MED ORDER — HEPARIN SOD (PORK) LOCK FLUSH 100 UNIT/ML IV SOLN
500.0000 [IU] | Freq: Once | INTRAVENOUS | Status: AC | PRN
  Administered 2018-09-02: 500 [IU]
  Filled 2018-09-02: qty 5

## 2018-09-02 MED ORDER — SODIUM CHLORIDE 0.9% FLUSH
10.0000 mL | INTRAVENOUS | Status: DC | PRN
  Administered 2018-09-02: 10 mL
  Filled 2018-09-02: qty 10

## 2018-09-02 NOTE — Telephone Encounter (Signed)
Scheduled appt per 7/6 los. Spoke with patient and she is aware of her appt date and time. °

## 2018-09-02 NOTE — Progress Notes (Signed)
[place]   Telephone:(336) [phone] [phone]   Clinic Follow up Note   Patient Care Team: [name], MD as PCP - General (Family Medicine) Arta Silence, MD as Consulting Physician (Gastroenterology) Truitt Merle, MD as Consulting Physician (Hematology) 09/02/2018  CHIEF COMPLAINT: f/u pancreatic cancer   SUMMARY OF ONCOLOGIC HISTORY: Oncology History Overview Note  Cancer Staging Pancreatic cancer [facility]) Staging form: Exocrine Pancreas, AJCC 8th Edition - Clinical stage from 06/13/2017: Stage III (cT4, cN0, cM0) - Signed by Truitt Merle, MD on 06/13/2017     Pancreatic cancer ([place])  06/08/2017 Imaging   06/08/2017 CT Abdomen IMPRESSION: Irregular solid hypoechoic mass within the pancreatic head, 2.6 x 2.2 cm with associated pancreatic ductal dilatation and pancreatic atrophy, most compatible with pancreatic cancer. 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  Diffuse colonic diverticulosis. Slight stranding around the distal descending colon may reflect early active diverticulitis.  Bilateral renal parapelvic cysts.  Aortic atherosclerosis.   06/13/2017 Initial Diagnosis   Pancreatic cancer ([place])   06/13/2017 Cancer Staging   Staging form: Exocrine Pancreas, AJCC 8th Edition - Clinical stage from 06/13/2017: Stage III (cT4, cN0, cM0) - Signed by Truitt Merle, MD on 06/13/2017   06/13/2017 Procedure   EUS by Dr. Paulita Fujita 06/13/17  IMPRESSION:  -There was no sign of significant pathology in the ampulla. - There was no evidence of significant pathology in the left lobe of the liver. - A mass was identified in the pancreatic head. Abutment SMV; invasion portal confluence; no adenopathy. Fine needle aspiration performed. If this is a pancreatic adenocarcinoma, it would be staged T4/N0/Mx by EUS.   06/13/2017 Initial Biopsy   Diagnosis 06/13/17 FINE NEEDLE  ASPIRATION, ENDOSCOPIC, PANCREAS HEAD (SPECIMEN 1 OF 1 COLLECTED 06/13/17): ADENOCARCINOMA. Preliminary Diagnosis Intraoperative Diagnosis: 1-3) ATYPICAL CELLS, ADDITIONAL MATERIAL REQUESTED. (NDK) 4-6 ADENOCARCINOMA. (NDK) Reported to Dr. Paulita Fujita on 06/13/17 @ 11:07am.    06/21/2017 Imaging   CT Chest without contrast IMPRESSION: 1. No findings suspicious for metastatic disease within the chest.  2. Tiny subpleural right lower lobe pulmonary nodule is nonspecific, but likely benign. This can be addressed on follow-up imaging.  3. Aortic Atherosclerosis (ICD10-I70.0).   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  The Hereditary Gene Panel offered by [facility] includes sequencing and/or deletion duplication testing of the following 47 genes: APC, ATM, AXIN2, BARD1, BMPR1A, BRCA1, BRCA2, BRIP1, CDH1, CDK4, CDKN2A (p14ARF), CDKN2A (p16INK4a), CHEK2, CTNNA1, DICER1, EPCAM (Deletion/duplication testing only), GREM1 (promoter region deletion/duplication testing only), KIT, MEN1, MLH1, MSH2, MSH3, MSH6, MUTYH, NBN, NF1, NHTL1, PALB2, PDGFRA, PMS2, POLD1, POLE, PTEN, RAD50, RAD51C, RAD51D, SDHB, SDHC, SDHD, SMAD4, SMARCA4. STK11, TP53, TSC1, TSC2, and VHL.  The following genes were evaluated for sequence changes only: SDHA and HOXB13 c.251G>A variant only. The report date is August 14, 2017.   09/24/2017 Progression   09/24/2017 CT CAP Pancreas: The mass involving the head of pancreas measures 2.5 x 3.8 by 3.1 cm. On the previous exam this measured 2.2 x 2.6 by 3.4 cm.   09/24/2017 Imaging   09/24/2017 CT CAP IMPRESSION: 1. There is been interval local progression of disease with increased size of head of pancreas neoplasm. There is progressive vascular involvement with partial encasement of the celiac trunk and SMA. Continued encasement and marked narrowing of  the  portal venous confluence. 2. No evidence for metastatic adenopathy within the abdomen, liver metastasis or pulmonary metastasis. 3. Aortic atherosclerosis and LAD coronary artery atherosclerotic calcifications. Aortic Atherosclerosis (ICD10-I70.0).   01/04/2018 Imaging   01/04/2018 CT CAP IMPRESSION: 1. No local disease progression identified. The pancreatic head mass is ill-defined and appears slightly smaller. There is chronic partial encasement of the celiac trunk and SMA and chronic short segment occlusion at the SMV/portal vein confluence. 2. No definite evidence of metastatic disease. Stable small hypervascular lesion inferiorly in the right hepatic lobe, likely an incidental vascular lesion. There is slightly greater mesenteric nodularity posterior to the transverse colon, but no other definite signs of peritoneal carcinomatosis. Attention on follow-up recommended. 3. Stable scattered tiny subpleural pulmonary nodules bilaterally, likely benign based on stability, although attention on follow-up recommended. 4. Mild splenomegaly. 5. Aortic Atherosclerosis (ICD10-I70.0). Port-A-Cath tip in the mid right atrium, stable.   04/02/2018 Imaging   CT AP  IMPRESSION: 1. Stable appearance of pancreatic head mass with chronic partial encasement of the celiac trunk and SMA and occlusion of the portal venous confluence. 2. No definite evidence for metastatic disease. Similar appearance of subtle soft tissue nodule posterior to the transverse colon without additional signs of peritoneal carcinomatosis. 3. Mild splenomegaly. 4.  Aortic Atherosclerosis (ICD10-I70.0).    08/14/2018 Imaging   CT CAP W Contrast 08/14/18    IMPRESSION: 1. Marked interval progression of the pancreatic head mass, now measuring up to 5.2 cm in maximum dimension (compared to 2.2 cm maximum dimension previously). The superior mesenteric vein and splenic vein are occluded centrally at the portal splenic  confluence. Tumor is contiguous with the posterior wall of the gastric antrum and medial wall of the duodenum, generating mass-effect on both. Tumor abuts both the distal celiac axis and SMA and both arteries remain patent. 2. Interval development of at least 3 hepatic metastases, measuring up to 3.0 cm in diameter. 3. Stable tiny bilateral pulmonary nodules, likely benign but attention on follow-up recommended. 4. Stable splenomegaly. 5.  Aortic Atherosclerois (ICD10-170.0)     08/26/2018 -  Chemotherapy   The patient had PACLitaxel-protein bound (ABRAXANE) chemo infusion 175 mg, 100 mg/m2 = 175 mg (80 % of original dose 125 mg/m2), Intravenous,  Once, 1 of 4 cycles Dose modification: 100 mg/m2 (original dose 125 mg/m2, Cycle 1, Reason: Provider Judgment, Comment: advanced age and previous cytopenia from chemo) Administration: 175 mg (08/26/2018) gemcitabine (GEMZAR) 1,330 mg in sodium chloride 0.9 % 250 mL chemo infusion, 800 mg/m2 = 1,330 mg (80 % of original dose 1,000 mg/m2), Intravenous,  Once, 1 of 4 cycles Dose modification: 800 mg/m2 (original dose 1,000 mg/m2, Cycle 1, Reason: Provider Judgment) Administration: 1,330 mg (08/26/2018)  for chemotherapy treatment.      CURRENT THERAPY: -chemo FOLFIRINOX every 2 weeksstarting 5/23/19with dose reduction due to thrombocytopenia.Further reduced to every 3 weeks starting 05/20/18 due to thrombocytopenia.She had chemo break 06/19/18-07/27/18.  -Began 2nd line gemcitabine and abraxane 2 weeks on and 1 week off starting 08/26/18  -Promacta for thrombocytopenia, currently ay 16m daily. Increased to 1049mdaily on 05/06/18  INTERVAL HISTORY: Ms. BlBolteeturns for f/u and day 8 chemo as scheduled. She completed cycle 1 day 1 gemcitabine and abraxane on 08/26/18. She felt "wiped out" after treatment. She vomited twice and had diarrhea that night after chemo, had intermittent nausea thereafter for a few days but no other vomiting. She had  sneezing and rhinorrhea as well as a "lazy eye" for 2 days following  treatment. All symptoms resolved by day 5 (Friday 7/3). She treated a sore mouth with mouth wash, no sores today. She noticed darker urine last week but thinks this has resolved, denies dysuria or hematuria. Denies fever, chills, cough, chest pain, dyspnea, leg edema, abdominal pain, or neuropathy.    MEDICAL HISTORY:  Past Medical History:  Diagnosis Date  . Allergic rhinitis    Skin Test 04-14-2009  . Asthma   . Cancer ([place]) 1981   adenocarcinoma of uterus  . Cancer ([place]) 2019   PANCREATIC   . Diabetes mellitus without complication ([place])    type II, no meds per pt  . Family history of breast cancer   . History of uterine cancer   . Hyperlipemia   . Hypertension   . Insomnia     SURGICAL HISTORY: Past Surgical History:  Procedure Laterality Date  . ABDOMINAL HYSTERECTOMY    . CARPAL TUNNEL RELEASE    . EUS N/A 06/13/2017   Procedure: FULL UPPER ENDOSCOPIC ULTRASOUND (EUS) RADIAL;  Surgeon: Arta Silence, MD;  Location: [facility];  Service: Endoscopy;  Laterality: N/A;  . I&D EXTREMITY  09/17/2011   Procedure: IRRIGATION AND DEBRIDEMENT EXTREMITY;  Surgeon: Roseanne Kaufman, MD;  Location: [facility];  Service: Orthopedics;  Laterality: Left;  with drain placement  . IR FLUORO GUIDE PORT INSERTION RIGHT  07/18/2017  . IR US GUIDE VASC ACCESS RIGHT  07/18/2017  . NASAL SEPTOPLASTY W/ TURBINOPLASTY    . pelvic floor rebuild    . TONSILLECTOMY    . TOTAL ABDOMINAL HYSTERECTOMY W/ BILATERAL SALPINGOOPHORECTOMY      I have reviewed the social history and family history with the patient and they are unchanged from previous note.  ALLERGIES:  is allergic to cephalexin and nabumetone.  MEDICATIONS:  Current Outpatient Medications  Medication Sig Dispense Refill  . ALPRAZolam (XANAX) 0.5 MG tablet Take 1 tablet (0.5 mg total) by mouth at bedtime as needed for anxiety. 90 tablet 0  . Ascorbic Acid (VITAMIN C) 500 MG  PACK Take 100 mg by mouth daily.    Marland Kitchen atorvastatin (LIPITOR) 10 MG tablet Take 10 mg by mouth at bedtime.     Marland Kitchen CALCIUM PO Take 1 tablet by mouth daily.     . Cholecalciferol (VITAMIN D) 2000 units CAPS Take by mouth.    . Cyanocobalamin (VITAMIN B-12 PO) Take 1 tablet by mouth daily.     . diphenoxylate-atropine (LOMOTIL) 2.5-0.025 MG tablet Take 1-2 tablets by mouth 4 (four) times daily as needed for diarrhea or loose stools. 60 tablet 1  . eltrombopag (PROMACTA) 50 MG tablet TAKE 2 TABLETS (100 MG) BY MOUTH EVERY DAY ON AN EMPTY STOMACH, 1 HOUR BEFORE OR 2 HOURS AFTER A MEAL 60 tablet 0  . hydrochlorothiazide (HYDRODIURIL) 25 MG tablet Take 1 tablet by mouth daily as needed.     Marland Kitchen HYDROcodone-acetaminophen (NORCO) 5-325 MG tablet Take 1 tablet by mouth every 6 (six) hours as needed for moderate pain. 40 tablet 0  . hyoscyamine (LEVBID) 0.375 MG 12 hr tablet Take 0.375 mg by mouth every 12 (twelve) hours as needed for cramping.     Marland Kitchen levothyroxine (SYNTHROID, LEVOTHROID) 25 MCG tablet Take 25 mcg by mouth daily before breakfast.     . magic mouthwash w/lidocaine SOLN Take 5 mLs by mouth 3 (three) times daily as needed for mouth pain. 240 mL 0  . metFORMIN (GLUCOPHAGE) 500 MG tablet TAKE 1 TABLET BY MOUTH EVERY DAY WITH BREAKFAST 30 tablet 1  .  Omega-3 Fatty Acids (FISH OIL PO) Take 1 capsule by mouth daily.     Marland Kitchen omeprazole (PRILOSEC) 20 MG capsule Take 1 tablet by mouth daily.    . ondansetron (ZOFRAN) 8 MG tablet Take 1 tablet (8 mg total) by mouth every 8 (eight) hours as needed for nausea or vomiting. 30 tablet 1  . ONETOUCH DELICA LANCETS 70W MISC USE TO TEST YOUR BLOOD SUGAR ONCE A DAY FASTING AND 2 HRS AFTER A MEAL AS NEEDED  3  . ONETOUCH VERIO test strip USE TO TEST YOUR BLOOD SUGAR ONCE A DAY FASTING & 2 HRS AFTER A MEAL AS NEEDED  3  . potassium chloride SA (KLOR-CON M20) 20 MEQ tablet Take 1 tablet (20 mEq total) by mouth 2 (two) times daily. (Patient taking differently: Take 20 mEq  by mouth daily. ) 60 tablet 1  . prochlorperazine (COMPAZINE) 10 MG tablet Take 1 tablet (10 mg total) by mouth every 6 (six) hours as needed for nausea or vomiting. 30 tablet 1  . ROCKLATAN 0.02-0.005 % SOLN PLACE 1 DROP INTO BOTH EYES AT BEDTIME  12  . traZODone (DESYREL) 100 MG tablet TAKE 1 TABLET BY MOUTH EVERYDAY AT BEDTIME 90 tablet 1  . lidocaine-prilocaine (EMLA) cream Apply 1 application topically as needed. 30 g 2   Current Facility-Administered Medications  Medication Dose Route Frequency Provider Last Rate Last Dose  . sodium chloride flush (NS) 0.9 % injection 10 mL  10 mL Intracatheter PRN Truitt Merle, MD   10 mL at 09/02/18 1228    PHYSICAL EXAMINATION: ECOG PERFORMANCE STATUS: 0 - Asymptomatic  Vitals:   09/02/18 1125  BP: (!) 145/77  Pulse: 95  Resp: 18  Temp: 98.5 F (36.9 C)  SpO2: 100%   Filed Weights   09/02/18 1125  Weight: 138 lb 1.6 oz (62.6 kg)    GENERAL:alert, no distress and comfortable SKIN: no obvious rash. Small bruising to arms.  EYES: Conjunctiva are pink and non-injected, mild scleral icterus  LUNGS: respirations even and unlabored  HEART: no lower extremity edema ABDOMEN: abdomen soft, non-tender. No palpable hepatomegaly  Musculoskeletal:no cyanosis of digits  NEURO: alert & oriented x 3 with fluent speech, normal gait PAC without erythema   LABORATORY DATA:  I have reviewed the data as listed CBC Latest Ref Rng & Units 09/02/2018 08/26/2018 08/19/2018  WBC 4.0 - 10.5 K/uL 4.2 6.1 7.5  Hemoglobin 12.0 - 15.0 g/dL 9.8(L) 11.2(L) 11.3(L)  Hematocrit 36.0 - 46.0 % 29.2(L) 33.6(L) 33.9(L)  Platelets 150 - 400 K/uL 54(L) 205 185     CMP Latest Ref Rng & Units 09/02/2018 08/26/2018 08/19/2018  Glucose 70 - 99 mg/dL 212(H) 261(H) 206(H)  BUN 8 - 23 mg/dL '10 11 11  ' Creatinine 0.44 - 1.00 mg/dL 0.73 0.75 0.91  Sodium 135 - 145 mmol/L 135 137 138  Potassium 3.5 - 5.1 mmol/L 4.5 4.3 4.5  Chloride 98 - 111 mmol/L 100 101 103  CO2 22 - 32 mmol/L '26  27 26  ' Calcium 8.9 - 10.3 mg/dL 8.9 9.4 9.6  Total Protein 6.5 - 8.1 g/dL 6.6 7.1 7.2  Total Bilirubin 0.3 - 1.2 mg/dL 3.3(H) 1.2 0.4  Alkaline Phos 38 - 126 U/L 493(H) 458(H) 206(H)  AST 15 - 41 U/L 46(H) 155(H) 24  ALT 0 - 44 U/L 46(H) 155(H) 15      RADIOGRAPHIC STUDIES: I have personally reviewed the radiological images as listed and agreed with the findings in the report. No results found.  ASSESSMENT & PLAN: Carly [name] is a 78 y.o. 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 Unfortunately this is unresectable tumor. -She has been on first lineChemo FOLFIRINOXsince 5/23/19with dose reduction due to cytopenia, andPromactafor chemo related thrombocytopenia. She tolerated chemo verywell. Reduced to every 3 weeks given prolonged blood count recovery. -Shewas[facility]viously[facility] Gennaro Africa her tumor was not resectable.He encouraged her to continue with current chemo treatment for as long as possible. -CT on 08/14/18 shows interval progression of pancreatic mass measuring 5.2cm (previoulsy 2.2cm), development of 3 hepatic metastases 3.0cm in diameter which are highly specious for liver metastasis, stable splenomegaly, and stable tiny bilateral pulmonary nodules, likely benign. no evidence of biliary dilatation. Dr. Burr Medico previously discussed with her and updated the family -she began second line chemo with gemcitabine and abraxane 2 weeks on and 1 week off, s/p cycle 1 day 1 on 08/26/18. She tolerated moderately well with vomiting and diarrhea day of treatment, then fatigue and intermittent but controlled nausea until day 5. Patient  prefers aloxi which helped with previous regimen. Will add. She has recovered well today -I reviewed anti-emetics, she only used compazine. I refilled compazine and zofran and encouraged her to alternate.  -AST/ALT and alk phos were elevated last week, AST/ALT improved today, alk phos remains elevated. Bili is elevated today with mild juandice, 3.3 which is new for her. CBC with plt count 54K.  -I recommend to hold chemotherapy today. Continue promacta 100 mg daily. Will monitor labs closely. -I reviewed s/s to report such as increased abdominal pain, more yellowing of the eyes, dark urine, fever, chills, vomiting, or bleeding. She understands to call immediately.  -will r/s lab, f/u, and potential treatment with likely dose reduction in 1 week. I discussed with Dr. Benay Spice. I also updated Nehemiah Settle her daughter in law and caregiver.   2. DM -Continue metformin. -BG at212 today  3. Diarrhea, abdominal pain, and weight loss -secondary to cancer and chemo -She denies abdominal pain today -she experienced diarrhea for a few hours after cycle 1 day 1 gem/abraxane. She did not take lomotil. I encouraged her to take medication is diarrhea recurs to prevent electrolyte imbalance and dehydration. She agrees Pain previously resolved,recurred lately but overall mild. Diarrhea controlledwith chemo reduction.  -During latest chemo break she had constipation. Witha little miralx she developed diarrhea shortly.   4. H/oEndometrialCancer, Stage I, diagnosed in 1981 -S/p hysterectomy and BSO in 1981. -f/u with OB/GYN and continue annual Pap smears. -Genetics - negative  5.Hypokalemia -Continues Potassium 60mq daily -K is normal, monitoring closely   6.HTN, Hypothyroidism -f/u with PCP -BP 145/77 today, stable   7.Chemo induced thrombocytopenia -plt 54K today, hold day 8 chemotherapy.  -continue promacta 100 mg, monitoring liver function closely   8.Goal of care discussion, DNR/DNI  -treatment goal is palliative  9. 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  -Plan  reviewed with Dr. Benay Spice   All questions were answered. The patient knows to call the clinic with any problems, questions or concerns. No barriers to learning was detected. I spent 20 minutes counseling the patient face to face. The total time spent in the appointment was 25 minutes and more than 50% was on counseling and review of test results     Alla Feeling, NP 09/02/18

## 2018-09-03 ENCOUNTER — Other Ambulatory Visit: Payer: Self-pay | Admitting: Nurse Practitioner

## 2018-09-03 ENCOUNTER — Telehealth: Payer: Self-pay

## 2018-09-03 NOTE — Telephone Encounter (Signed)
TC to central scheduling [phone] per Lacie to schedule patient for abdominal US abdomen to rule out bile obstruction. Appointment is scheduled for this Thursday 09/05/18 @9 :00am. Patient is to not have anything to eat or drink after midnight. Patient is aware of appointment date, time, and instructions.

## 2018-09-05 ENCOUNTER — Other Ambulatory Visit: Payer: Self-pay

## 2018-09-05 ENCOUNTER — Ambulatory Visit (HOSPITAL_COMMUNITY)
Admission: RE | Admit: 2018-09-05 | Discharge: 2018-09-05 | Disposition: A | Discharge status: Home / Self Care | Payer: Medicare Other | Source: Ambulatory Visit | Attending: Nurse Practitioner | Admitting: Nurse Practitioner

## 2018-09-05 ENCOUNTER — Telehealth: Payer: Self-pay | Admitting: Nurse Practitioner

## 2018-09-05 NOTE — Telephone Encounter (Signed)
Called patient to review [place] results which shows interval development of biliary ductal dilatation. We have reached out to Dr. Paulita Fujita to do ERCP with stenting. She will be hearing from his office. Patient understands and agrees. I rescheduled her appointments. She appreciates the call. Cira Rue, NP  09/05/18

## 2018-09-06 ENCOUNTER — Ambulatory Visit (HOSPITAL_COMMUNITY): Payer: Medicare Other

## 2018-09-06 ENCOUNTER — Other Ambulatory Visit: Payer: Self-pay | Admitting: Gastroenterology

## 2018-09-06 DIAGNOSIS — H2513 Age-related nuclear cataract, bilateral: Secondary | ICD-10-CM | POA: Diagnosis not present

## 2018-09-06 DIAGNOSIS — H401132 Primary open-angle glaucoma, bilateral, moderate stage: Secondary | ICD-10-CM | POA: Diagnosis not present

## 2018-09-07 ENCOUNTER — Other Ambulatory Visit (HOSPITAL_COMMUNITY)
Admission: RE | Admit: 2018-09-07 | Discharge: 2018-09-07 | Disposition: A | Discharge status: Home / Self Care | Payer: Medicare Other | Source: Ambulatory Visit | Attending: Gastroenterology | Admitting: Gastroenterology

## 2018-09-07 DIAGNOSIS — Z1159 Encounter for screening for other viral diseases: Secondary | ICD-10-CM | POA: Diagnosis not present

## 2018-09-07 DIAGNOSIS — Z01812 Encounter for preprocedural laboratory examination: Secondary | ICD-10-CM | POA: Insufficient documentation

## 2018-09-07 LAB — SARS CORONAVIRUS 2 (TAT 6-24 HRS): SARS Coronavirus 2: NEGATIVE

## 2018-09-08 ENCOUNTER — Encounter: Payer: Self-pay | Admitting: Nurse Practitioner

## 2018-09-09 ENCOUNTER — Inpatient Hospital Stay: Payer: Medicare Other | Admitting: Nurse Practitioner

## 2018-09-09 ENCOUNTER — Inpatient Hospital Stay: Payer: Medicare Other

## 2018-09-09 ENCOUNTER — Telehealth: Payer: Self-pay | Admitting: Hematology

## 2018-09-09 ENCOUNTER — Telehealth: Payer: Self-pay

## 2018-09-09 ENCOUNTER — Ambulatory Visit: Payer: Medicare Other

## 2018-09-09 ENCOUNTER — Other Ambulatory Visit: Payer: Medicare Other

## 2018-09-09 ENCOUNTER — Ambulatory Visit: Payer: Medicare Other | Admitting: Hematology

## 2018-09-09 NOTE — Telephone Encounter (Signed)
TC to pt per [facility] regarding her question about today's appointment. This appointment should have been cancelled since indicated chemo will not occur until bile duct stent inserted. Patient verbalized understanding. No further problems or concerns at this time.

## 2018-09-09 NOTE — Telephone Encounter (Signed)
Re previous message per YF 7/17 is fine. Confirmed with patient.

## 2018-09-09 NOTE — Telephone Encounter (Signed)
Following up on 7/9 schedule message (MX out). Patient scheduled for lab/port/fu/tx 7/17. Spoke with patient to confirm appointments. Per patient she is scheduled for a stent to be placed in her bile duct 7/15. Per patient she spoke with Lacie this morning and was told her her appointment was next Monday.   Patient currently on schedule for 7/17, 7/27 and 8/3. No appointments on scheduled for Monday 7/20. Patient aware message would be sent to YF to confirm.

## 2018-09-09 NOTE — Telephone Encounter (Signed)
Yes, 7/17 is fine, thanks   Truitt Merle MD

## 2018-09-10 ENCOUNTER — Encounter (HOSPITAL_COMMUNITY): Payer: Self-pay | Admitting: Emergency Medicine

## 2018-09-10 ENCOUNTER — Other Ambulatory Visit: Payer: Self-pay | Admitting: Hematology

## 2018-09-10 ENCOUNTER — Other Ambulatory Visit: Payer: Self-pay

## 2018-09-10 NOTE — Progress Notes (Signed)
Pre-op endoscopy call completed.

## 2018-09-10 NOTE — Progress Notes (Signed)
Spoke with patient, arrival time of 0930, verbalize understanding of NPO after midnight.  Has quarantined since Covid 19 testing and no new symptoms.

## 2018-09-11 ENCOUNTER — Encounter (HOSPITAL_COMMUNITY)
Admission: RE | Disposition: A | Discharge status: Home / Self Care | Payer: Self-pay | Source: Home / Self Care | Attending: Gastroenterology

## 2018-09-11 ENCOUNTER — Ambulatory Visit (HOSPITAL_COMMUNITY): Payer: Medicare Other | Admitting: Certified Registered Nurse Anesthetist

## 2018-09-11 ENCOUNTER — Encounter (HOSPITAL_COMMUNITY): Payer: Self-pay

## 2018-09-11 ENCOUNTER — Telehealth: Payer: Self-pay

## 2018-09-11 ENCOUNTER — Ambulatory Visit (HOSPITAL_COMMUNITY): Payer: Medicare Other

## 2018-09-11 ENCOUNTER — Ambulatory Visit (HOSPITAL_COMMUNITY)
Admission: RE | Admit: 2018-09-11 | Discharge: 2018-09-11 | Disposition: A | Discharge status: Home / Self Care | Payer: Medicare Other | Attending: Gastroenterology | Admitting: Gastroenterology

## 2018-09-11 ENCOUNTER — Other Ambulatory Visit: Payer: Self-pay

## 2018-09-11 ENCOUNTER — Other Ambulatory Visit: Payer: Self-pay | Admitting: Hematology

## 2018-09-11 DIAGNOSIS — E785 Hyperlipidemia, unspecified: Secondary | ICD-10-CM | POA: Insufficient documentation

## 2018-09-11 DIAGNOSIS — I10 Essential (primary) hypertension: Secondary | ICD-10-CM | POA: Diagnosis not present

## 2018-09-11 DIAGNOSIS — D696 Thrombocytopenia, unspecified: Secondary | ICD-10-CM | POA: Diagnosis not present

## 2018-09-11 DIAGNOSIS — Z87891 Personal history of nicotine dependence: Secondary | ICD-10-CM | POA: Insufficient documentation

## 2018-09-11 DIAGNOSIS — J45909 Unspecified asthma, uncomplicated: Secondary | ICD-10-CM | POA: Diagnosis not present

## 2018-09-11 DIAGNOSIS — E039 Hypothyroidism, unspecified: Secondary | ICD-10-CM | POA: Insufficient documentation

## 2018-09-11 DIAGNOSIS — Z79890 Hormone replacement therapy (postmenopausal): Secondary | ICD-10-CM | POA: Diagnosis not present

## 2018-09-11 DIAGNOSIS — C787 Secondary malignant neoplasm of liver and intrahepatic bile duct: Secondary | ICD-10-CM | POA: Diagnosis not present

## 2018-09-11 DIAGNOSIS — Z79899 Other long term (current) drug therapy: Secondary | ICD-10-CM | POA: Insufficient documentation

## 2018-09-11 DIAGNOSIS — K831 Obstruction of bile duct: Secondary | ICD-10-CM

## 2018-09-11 DIAGNOSIS — C259 Malignant neoplasm of pancreas, unspecified: Secondary | ICD-10-CM | POA: Diagnosis not present

## 2018-09-11 DIAGNOSIS — Z7984 Long term (current) use of oral hypoglycemic drugs: Secondary | ICD-10-CM | POA: Insufficient documentation

## 2018-09-11 DIAGNOSIS — C250 Malignant neoplasm of head of pancreas: Secondary | ICD-10-CM

## 2018-09-11 DIAGNOSIS — E119 Type 2 diabetes mellitus without complications: Secondary | ICD-10-CM | POA: Insufficient documentation

## 2018-09-11 DIAGNOSIS — Z8542 Personal history of malignant neoplasm of other parts of uterus: Secondary | ICD-10-CM | POA: Insufficient documentation

## 2018-09-11 DIAGNOSIS — K3189 Other diseases of stomach and duodenum: Secondary | ICD-10-CM | POA: Diagnosis not present

## 2018-09-11 DIAGNOSIS — D490 Neoplasm of unspecified behavior of digestive system: Secondary | ICD-10-CM | POA: Diagnosis not present

## 2018-09-11 DIAGNOSIS — K9189 Other postprocedural complications and disorders of digestive system: Secondary | ICD-10-CM | POA: Diagnosis not present

## 2018-09-11 HISTORY — PX: ESOPHAGOGASTRODUODENOSCOPY (EGD) WITH PROPOFOL: SHX5813

## 2018-09-11 LAB — CBC
HCT: 32.8 % — ABNORMAL LOW (ref 36.0–46.0)
Hemoglobin: 10.6 g/dL — ABNORMAL LOW (ref 12.0–15.0)
MCH: 32.2 pg (ref 26.0–34.0)
MCHC: 32.3 g/dL (ref 30.0–36.0)
MCV: 99.7 fL (ref 80.0–100.0)
Platelets: 221 10*3/uL (ref 150–400)
RBC: 3.29 MIL/uL — ABNORMAL LOW (ref 3.87–5.11)
RDW: 14.8 % (ref 11.5–15.5)
WBC: 4.8 10*3/uL (ref 4.0–10.5)
nRBC: 0 % (ref 0.0–0.2)

## 2018-09-11 LAB — GLUCOSE, CAPILLARY: Glucose-Capillary: 146 mg/dL — ABNORMAL HIGH (ref 70–99)

## 2018-09-11 SURGERY — ESOPHAGOGASTRODUODENOSCOPY (EGD) WITH PROPOFOL
Anesthesia: General

## 2018-09-11 MED ORDER — GLUCAGON HCL RDNA (DIAGNOSTIC) 1 MG IJ SOLR
INTRAMUSCULAR | Status: AC
  Filled 2018-09-11: qty 1

## 2018-09-11 MED ORDER — GLUCAGON HCL RDNA (DIAGNOSTIC) 1 MG IJ SOLR
INTRAMUSCULAR | Status: DC | PRN
  Administered 2018-09-11 (×3): .5 mg via INTRAVENOUS

## 2018-09-11 MED ORDER — SODIUM CHLORIDE 0.9 % IV SOLN: INTRAVENOUS | Status: DC

## 2018-09-11 MED ORDER — FENTANYL CITRATE (PF) 100 MCG/2ML IJ SOLN
INTRAMUSCULAR | Status: DC | PRN
  Administered 2018-09-11 (×2): 50 ug via INTRAVENOUS

## 2018-09-11 MED ORDER — ESMOLOL HCL 100 MG/10ML IV SOLN
INTRAVENOUS | Status: DC | PRN
  Administered 2018-09-11 (×2): 10 mg via INTRAVENOUS

## 2018-09-11 MED ORDER — EPHEDRINE SULFATE 50 MG/ML IJ SOLN
INTRAMUSCULAR | Status: DC | PRN
  Administered 2018-09-11: 10 mg via INTRAVENOUS

## 2018-09-11 MED ORDER — FENTANYL CITRATE (PF) 100 MCG/2ML IJ SOLN
INTRAMUSCULAR | Status: AC
  Filled 2018-09-11: qty 2

## 2018-09-11 MED ORDER — CIPROFLOXACIN IN D5W 400 MG/200ML IV SOLN
INTRAVENOUS | Status: DC | PRN
  Administered 2018-09-11: 400 mg via INTRAVENOUS

## 2018-09-11 MED ORDER — SUGAMMADEX SODIUM 200 MG/2ML IV SOLN
INTRAVENOUS | Status: DC | PRN
  Administered 2018-09-11: 200 mg via INTRAVENOUS

## 2018-09-11 MED ORDER — SUCCINYLCHOLINE CHLORIDE 200 MG/10ML IV SOSY
PREFILLED_SYRINGE | INTRAVENOUS | Status: DC | PRN
  Administered 2018-09-11: 100 mg via INTRAVENOUS

## 2018-09-11 MED ORDER — PROPOFOL 10 MG/ML IV BOLUS
INTRAVENOUS | Status: DC | PRN
  Administered 2018-09-11: 100 mg via INTRAVENOUS

## 2018-09-11 MED ORDER — DEXAMETHASONE SODIUM PHOSPHATE 4 MG/ML IJ SOLN
INTRAMUSCULAR | Status: DC | PRN
  Administered 2018-09-11: 4 mg via INTRAVENOUS

## 2018-09-11 MED ORDER — INDOMETHACIN 50 MG RE SUPP
RECTAL | Status: DC | PRN
  Administered 2018-09-11: 100 mg via RECTAL

## 2018-09-11 MED ORDER — LIDOCAINE 2% (20 MG/ML) 5 ML SYRINGE
INTRAMUSCULAR | Status: DC | PRN
  Administered 2018-09-11: 50 mg via INTRAVENOUS

## 2018-09-11 MED ORDER — ONDANSETRON HCL 4 MG/2ML IJ SOLN
INTRAMUSCULAR | Status: DC | PRN
  Administered 2018-09-11: 4 mg via INTRAVENOUS

## 2018-09-11 MED ORDER — LACTATED RINGERS IV SOLN
INTRAVENOUS | Status: DC
  Administered 2018-09-11 (×2): via INTRAVENOUS

## 2018-09-11 MED ORDER — CIPROFLOXACIN IN D5W 400 MG/200ML IV SOLN
INTRAVENOUS | Status: AC
  Filled 2018-09-11: qty 200

## 2018-09-11 MED ORDER — INDOMETHACIN 50 MG RE SUPP
RECTAL | Status: AC
  Filled 2018-09-11: qty 2

## 2018-09-11 MED ORDER — PHENYLEPHRINE 40 MCG/ML (10ML) SYRINGE FOR IV PUSH (FOR BLOOD PRESSURE SUPPORT)
PREFILLED_SYRINGE | INTRAVENOUS | Status: DC | PRN
  Administered 2018-09-11: 120 ug via INTRAVENOUS
  Administered 2018-09-11 (×4): 80 ug via INTRAVENOUS
  Administered 2018-09-11: 40 ug via INTRAVENOUS

## 2018-09-11 MED ORDER — ROCURONIUM BROMIDE 50 MG/5ML IV SOSY
PREFILLED_SYRINGE | INTRAVENOUS | Status: DC | PRN
  Administered 2018-09-11: 30 mg via INTRAVENOUS
  Administered 2018-09-11: 10 mg via INTRAVENOUS

## 2018-09-11 NOTE — Anesthesia Postprocedure Evaluation (Signed)
Anesthesia Post Note  Patient: MERLY HINKSON  Procedure(s) Performed: ENDOSCOPIC RETROGRADE CHOLANGIOPANCREATOGRAPHY (ERCP) (N/A )     Patient location during evaluation: Endoscopy Anesthesia Type: General Level of consciousness: awake and alert Pain management: pain level controlled Vital Signs Assessment: post-procedure vital signs reviewed and stable Respiratory status: spontaneous breathing, nonlabored ventilation and respiratory function stable Cardiovascular status: blood pressure returned to baseline and stable Postop Assessment: no apparent nausea or vomiting Anesthetic complications: no    Last Vitals:  Vitals:   09/11/18 1233 09/11/18 1240  BP:  (!) 117/55  Pulse: 100 96  Resp: (!) 21 20  Temp: 36.6 C   SpO2: 98% 94%    Last Pain:  Vitals:   09/11/18 1233  TempSrc: Temporal  PainSc: 0-No pain                 Katalina Magri,W. EDMOND

## 2018-09-11 NOTE — Transfer of Care (Signed)
Immediate Anesthesia Transfer of Care Note  Patient: Carly [name]  Procedure(s) Performed: Procedure(s): ENDOSCOPIC RETROGRADE CHOLANGIOPANCREATOGRAPHY (ERCP) (N/A)  Patient Location: PACU  Anesthesia Type:General  Level of Consciousness: Patient easily awoken, sedated, comfortable, cooperative, following commands, responds to stimulation.   Airway & Oxygen Therapy: Patient spontaneously breathing, ventilating well, oxygen via simple oxygen mask.  Post-op Assessment: Report given to PACU RN, vital signs reviewed and stable, moving all extremities.   Post vital signs: Reviewed and stable.  Complications: No apparent anesthesia complications Last Vitals:  Vitals Value Taken Time  BP 115/56 09/11/18 1232  Temp    Pulse 97 09/11/18 1236  Resp 18 09/11/18 1236  SpO2 100 % 09/11/18 1236  Vitals shown include unvalidated device data.  Last Pain:  Vitals:   09/11/18 1233  TempSrc: (P) Temporal  PainSc:          Complications: No apparent anesthesia complications

## 2018-09-11 NOTE — Anesthesia Procedure Notes (Signed)
Procedure Name: Intubation Date/Time: 09/11/2018 10:45 AM Performed by: Deliah Boston, CRNA Pre-anesthesia Checklist: Patient identified, Emergency Drugs available, Suction available and Patient being monitored Patient Re-evaluated:Patient Re-evaluated prior to induction Oxygen Delivery Method: Circle system utilized Preoxygenation: Pre-oxygenation with 100% oxygen Induction Type: IV induction Ventilation: Mask ventilation without difficulty Laryngoscope Size: Mac and 3 Grade View: Grade I Tube type: Oral Tube size: 7.0 mm Number of attempts: 1 Airway Equipment and Method: Stylet and Oral airway Placement Confirmation: ETT inserted through vocal cords under direct vision,  positive ETCO2 and breath sounds checked- equal and bilateral Secured at: 19 cm Tube secured with: Tape Dental Injury: Teeth and Oropharynx as per pre-operative assessment

## 2018-09-11 NOTE — H&P (Signed)
[facility] Admission Note  Chief Complaint: jaundice  HPI: Carly [name] is an 78 y.o. female.  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  Liver metastases.  New onset jaundice and new onset biliary ductal dilatation over the past couple weeks.  No abdominal pain.  Past Medical History:  Diagnosis Date  . Allergic rhinitis    Skin Test 04-14-2009  . Asthma   . Cancer ([place]) 1981   adenocarcinoma of uterus  . Cancer ([place]) 2019   PANCREATIC   . Diabetes mellitus without complication ([place])    type II, no meds per pt  . Family history of breast cancer   . History of uterine cancer   . Hyperlipemia   . Hypertension   . Insomnia     Past Surgical History:  Procedure Laterality Date  . ABDOMINAL HYSTERECTOMY    . CARPAL TUNNEL RELEASE    . EUS N/A 06/13/2017   Procedure: FULL UPPER ENDOSCOPIC ULTRASOUND (EUS) RADIAL;  Surgeon: Arta Silence, MD;  Location: [facility];  Service: Endoscopy;  Laterality: N/A;  . I&D EXTREMITY  09/17/2011   Procedure: IRRIGATION AND DEBRIDEMENT EXTREMITY;  Surgeon: Roseanne Kaufman, MD;  Location: [facility];  Service: Orthopedics;  Laterality: Left;  with drain placement  . IR FLUORO GUIDE PORT INSERTION RIGHT  07/18/2017  . IR US GUIDE VASC ACCESS RIGHT  07/18/2017  . NASAL SEPTOPLASTY W/ TURBINOPLASTY    . pelvic floor rebuild    . TONSILLECTOMY    . TOTAL ABDOMINAL HYSTERECTOMY W/ BILATERAL SALPINGOOPHORECTOMY      Medications Prior to Admission  Medication Sig Dispense Refill  . ALPRAZolam (XANAX) 0.5 MG tablet Take 1 tablet (0.5 mg total) by mouth at bedtime as needed for anxiety. (Patient taking differently: Take 0.25 mg by mouth at bedtime as needed for anxiety. ) 90 tablet 0  . atorvastatin (LIPITOR) 10 MG tablet Take 10 mg by mouth at bedtime.     Marland Kitchen CALCIUM PO Take 1 tablet by mouth once a week.     . Cholecalciferol (VITAMIN D) 2000 units CAPS Take 1 capsule by mouth once a week.     . diphenoxylate-atropine  (LOMOTIL) 2.5-0.025 MG tablet Take 1-2 tablets by mouth 4 (four) times daily as needed for diarrhea or loose stools. 60 tablet 1  . eltrombopag (PROMACTA) 50 MG tablet TAKE 2 TABLETS (100 MG) BY MOUTH EVERY DAY ON AN EMPTY STOMACH, 1 HOUR BEFORE OR 2 HOURS AFTER A MEAL (Patient taking differently: Take 100 mg by mouth daily at 2 am. TAKE 2 TABLETS (100 MG) BY MOUTH EVERY DAY ON AN EMPTY STOMACH, 1 HOUR BEFORE OR 2 HOURS AFTER A MEAL) 60 tablet 0  . HYDROcodone-acetaminophen (NORCO) 5-325 MG tablet Take 1 tablet by mouth every 6 (six) hours as needed for moderate pain. 40 tablet 0  . hyoscyamine (LEVBID) 0.375 MG 12 hr tablet Take 0.375 mg by mouth every 12 (twelve) hours as needed for cramping.     Marland Kitchen levothyroxine (SYNTHROID, LEVOTHROID) 25 MCG tablet Take 25 mcg by mouth daily before breakfast.     . lidocaine-prilocaine (EMLA) cream Apply 1 application topically as needed. (Patient taking differently: Apply 1 application topically as needed (prior to port-a-cath being accessed). ) 30 g 2  . loratadine (CLARITIN) 10 MG tablet Take 10 mg by mouth daily.    . magic mouthwash w/lidocaine SOLN Take 5 mLs by mouth 3 (three) times daily as needed for mouth pain. (Patient taking differently: Take 5 mLs by mouth 2 (  two) times a day. ) 240 mL 0  . metFORMIN (GLUCOPHAGE) 500 MG tablet TAKE 1 TABLET BY MOUTH EVERY DAY WITH BREAKFAST 30 tablet 1  . omeprazole (PRILOSEC) 20 MG capsule Take 20 mg by mouth daily before breakfast.     . ondansetron (ZOFRAN) 8 MG tablet Take 1 tablet (8 mg total) by mouth every 8 (eight) hours as needed for nausea or vomiting. 30 tablet 1  . potassium chloride SA (KLOR-CON M20) 20 MEQ tablet Take 1 tablet (20 mEq total) by mouth 2 (two) times daily. (Patient taking differently: Take 20 mEq by mouth daily. ) 60 tablet 1  . prochlorperazine (COMPAZINE) 10 MG tablet Take 1 tablet (10 mg total) by mouth every 6 (six) hours as needed for nausea or vomiting. 30 tablet 1  . ROCKLATAN  0.02-0.005 % SOLN Place 1 drop into both eyes at bedtime.   12  . traMADol (ULTRAM) 50 MG tablet Take 50 mg by mouth every 6 (six) hours as needed (pain).    . traZODone (DESYREL) 100 MG tablet TAKE 1 TABLET BY MOUTH EVERYDAY AT BEDTIME (Patient taking differently: Take 50 mg by mouth at bedtime. ) 90 tablet 1  . cyanocobalamin 2000 MCG tablet Take 2,000 mcg by mouth every evening.    . hydrochlorothiazide (HYDRODIURIL) 25 MG tablet Take 25 mg by mouth daily as needed (swelling (typically whenever traveling to beach)).     Glory Rosebush DELICA LANCETS 54O MISC USE TO TEST YOUR BLOOD SUGAR ONCE A DAY FASTING AND 2 HRS AFTER A MEAL AS NEEDED  3  . ONETOUCH VERIO test strip USE TO TEST YOUR BLOOD SUGAR ONCE A DAY FASTING & 2 HRS AFTER A MEAL AS NEEDED  3    Allergies:  Allergies  Allergen Reactions  . Cephalexin Hives and Swelling  . Nabumetone Hives and Swelling    Family History  Problem Relation Age of Onset  . Stroke Mother 75  . Heart attack Father 70  . Dementia Brother   . Hypertension Brother   . Skin cancer Brother   . Breast cancer Maternal Grandmother        dx >50  . Breast cancer Cousin        mat first cousin, dx in her 80s  . Cancer Cousin        oral cancer  . Breast cancer Cousin        mat first cousin, dx in her 13s  . Heart disease Maternal Uncle   . Other Maternal Grandfather        suicide  . Heart disease Paternal Grandmother     Social History:  reports that she quit smoking about 40 years ago. Her smoking use included cigarettes. She has a 7.50 pack-year smoking history. She has never used smokeless tobacco. She reports current alcohol use. She reports that she does not use drugs.   ROS: As per HPI, all others negative   Blood pressure (!) 143/63, pulse (!) 102, temperature 98.5 F (36.9 C), temperature source Oral, resp. rate 19, height 5\' 2"  (1.575 m), weight 63.5 kg, SpO2 97 %. General appearance: Elderly, cachectice, but NAD HEENT:  Jaundiced ABD:   Soft, non-tender NEURO:  Alert and oriented  Results for orders placed or performed during the hospital encounter of 09/11/18 (from the past 48 hour(s))  Glucose, capillary     Status: Abnormal   Collection Time: 09/11/18 10:11 AM  Result Value Ref Range   Glucose-Capillary 146 (H) 70 - 99  mg/dL   No results found.  Assessment/Plan  1.  Pancreatic adenocarcinoma, metastatic. 2.  New onset jaundice and new onset biliary ductal dilatation. 3.  Thrombocytopenia, chemotherapy-related? 4.  Plan for ERCP with biliary stent placement, if platelet counts are at an acceptable level. 5.  Risks (up to and including bleeding, infection, perforation, pancreatitis that can be complicated by infected necrosis and death), benefits (removal of stones, alleviating blockage, decreasing risk of cholangitis or choledocholithiasis-related pancreatitis), and alternatives (watchful waiting, percutaneous transhepatic cholangiography) of ERCP were explained to patient/family in detail and patient elects to proceed.   Landry Dyke 09/11/2018, 10:22 AM

## 2018-09-11 NOTE — Telephone Encounter (Signed)
Kathline Magic calls with regards to the bile duct procedure that the patient was supposed to have.  She is asking Dr. Burr Medico to call with regards to what the next treatment options are or next steps.  Her [phone]

## 2018-09-11 NOTE — Discharge Instructions (Signed)
Endoscopy °Care After °Please read the instructions outlined below and refer to this sheet in the next few weeks. These discharge instructions provide you with general information on caring for yourself after you leave the hospital. Your doctor may also give you specific instructions. While your treatment has been planned according to the most current medical practices available, unavoidable complications occasionally occur. If you have any problems or questions after discharge, please call Dr. Loranda Mastel ([facility]) at [phone]. ° °HOME CARE INSTRUCTIONS °Activity °· You may resume your regular activity but move at a slower pace for the next 24 hours.  °· Take frequent rest periods for the next 24 hours.  °· Walking will help expel (get rid of) the air and reduce the bloated feeling in your abdomen.  °· No driving for 24 hours (because of the anesthesia (medicine) used during the test).  °· You may shower.  °· Do not sign any important legal documents or operate any machinery for 24 hours (because of the anesthesia used during the test).  °Nutrition °· Drink plenty of fluids.  °· You may resume your normal diet.  °· Begin with a light meal and progress to your normal diet.  °· Avoid alcoholic beverages for 24 hours or as instructed by your caregiver.  °Medications °You may resume your normal medications unless your caregiver tells you otherwise. °What you can expect today °· You may experience abdominal discomfort such as a feeling of fullness or "gas" pains.  °· You may experience a sore throat for 2 to 3 days. This is normal. Gargling with salt water may help this.  °·  °SEEK IMMEDIATE MEDICAL CARE IF: °· You have excessive nausea (feeling sick to your stomach) and/or vomiting.  °· You have severe abdominal pain and distention (swelling).  °· You have trouble swallowing.  °· You have a temperature over 100° F (37.8° C).  °· You have rectal bleeding or vomiting of blood.  °Document Released:  09/28/2003 Document Revised: 10/26/2010 Document Reviewed: 04/10/2007 °[employer]® Patient Information ©2012 [employer]. °

## 2018-09-11 NOTE — Progress Notes (Signed)
[name]   [phone] [phone]   Clinic Follow up Note   Patient Care Team: Carly Amel, MD as PCP - General (Family Medicine) Carly Silence, MD as Consulting Physician (Gastroenterology) Carly Merle, MD as Consulting Physician (Hematology)  Date of Service:  09/13/2018  CHIEF COMPLAINT:  F/u of pancreatic cancer  SUMMARY OF ONCOLOGIC HISTORY: Oncology History Overview Note  Cancer Staging Pancreatic cancer [facility]) Staging form: Exocrine Pancreas, AJCC 8th Edition - Clinical stage from 06/13/2017: Stage III (cT4, cN0, cM0) - Signed by Carly Merle, MD on 06/13/2017     Pancreatic cancer ([place])  06/08/2017 Imaging   06/08/2017 CT Abdomen IMPRESSION: Irregular solid hypoechoic mass within the pancreatic head, 2.6 x 2.2 cm with associated pancreatic ductal dilatation and pancreatic atrophy, most compatible with pancreatic cancer. 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  Diffuse colonic diverticulosis. Slight stranding around the distal descending colon may reflect early active diverticulitis.  Bilateral renal parapelvic cysts.  Aortic atherosclerosis.   06/13/2017 Initial Diagnosis   Pancreatic cancer ([place])   06/13/2017 Cancer Staging   Staging form: Exocrine Pancreas, AJCC 8th Edition - Clinical stage from 06/13/2017: Stage III (cT4, cN0, cM0) - Signed by Carly Merle, MD on 06/13/2017   06/13/2017 Procedure   EUS by Dr. Paulita [name] 06/13/17  IMPRESSION:  -There was no sign of significant pathology in the ampulla. - There was no evidence of significant pathology in the left lobe of the liver. - A mass was identified in the pancreatic head. [place] SMV; invasion portal confluence; no adenopathy. Fine needle aspiration performed. If this is a pancreatic adenocarcinoma, it would be staged T4/N0/Mx by EUS.   06/13/2017 Initial Biopsy   Diagnosis 06/13/17  FINE NEEDLE ASPIRATION, ENDOSCOPIC, PANCREAS HEAD (SPECIMEN 1 OF 1 COLLECTED 06/13/17): ADENOCARCINOMA. Preliminary Diagnosis Intraoperative Diagnosis: 1-3) ATYPICAL CELLS, ADDITIONAL MATERIAL REQUESTED. (NDK) 4-6 ADENOCARCINOMA. (NDK) Reported to Dr. Paulita [name] on 06/13/17 @ 11:07am.    06/21/2017 Imaging   CT Chest without contrast IMPRESSION: 1. No findings suspicious for metastatic disease within the chest.  2. Tiny subpleural right lower lobe pulmonary nodule is nonspecific, but likely benign. This can be addressed on follow-up imaging.  3. Aortic Atherosclerosis (ICD10-I70.0).   07/19/2017 - 08/19/2018 Chemotherapy   Chemo FOLFIRINOX every 2 weeks starting 07/19/17 with dose reduction due to thrombocytopenia. Further reduced to every 3 weeks starting 05/20/18 due to thrombocytopenia. She had chemo break 06/19/18-07/27/18. D/c after cycle 21 on 08/19/18 due to disease progression.     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  The Hereditary Gene Panel offered by [facility] includes sequencing and/or deletion duplication testing of the following 47 genes: APC, ATM, AXIN2, BARD1, BMPR1A, BRCA1, BRCA2, BRIP1, CDH1, CDK4, CDKN2A (p14ARF), CDKN2A (p16INK4a), CHEK2, CTNNA1, DICER1, EPCAM (Deletion/duplication testing only), GREM1 (promoter region deletion/duplication testing only), KIT, MEN1, MLH1, MSH2, MSH3, MSH6, MUTYH, NBN, NF1, NHTL1, PALB2, PDGFRA, PMS2, POLD1, POLE, PTEN, RAD50, RAD51C, RAD51D, SDHB, SDHC, SDHD, SMAD4, SMARCA4. STK11, TP53, TSC1, TSC2, and VHL.  The following genes were evaluated for sequence changes only: SDHA and HOXB13 c.251G>A variant only. The report date is August 14, 2017.   09/24/2017 Progression   09/24/2017 CT CAP Pancreas: The mass involving the head of pancreas measures 2.5 x 3.8 by 3.1 cm. On the previous exam this measured 2.2 x 2.6 by 3.4 cm.   09/24/2017 Imaging   09/24/2017 CT  CAP IMPRESSION: 1. There is been  interval local progression of disease with increased size of head of pancreas neoplasm. There is progressive vascular involvement with partial encasement of the celiac trunk and SMA. Continued encasement and marked narrowing of the portal venous confluence. 2. No evidence for metastatic adenopathy within the abdomen, liver metastasis or pulmonary metastasis. 3. Aortic atherosclerosis and LAD coronary artery atherosclerotic calcifications. Aortic Atherosclerosis (ICD10-I70.0).   01/04/2018 Imaging   01/04/2018 CT CAP IMPRESSION: 1. No local disease progression identified. The pancreatic head mass is ill-defined and appears slightly smaller. There is chronic partial encasement of the celiac trunk and SMA and chronic short segment occlusion at the SMV/portal vein confluence. 2. No definite evidence of metastatic disease. Stable small hypervascular lesion inferiorly in the right hepatic lobe, likely an incidental vascular lesion. There is slightly greater mesenteric nodularity posterior to the transverse colon, but no other definite signs of peritoneal carcinomatosis. Attention on follow-up recommended. 3. Stable scattered tiny subpleural pulmonary nodules bilaterally, likely benign based on stability, although attention on follow-up recommended. 4. Mild splenomegaly. 5. Aortic Atherosclerosis (ICD10-I70.0). Port-A-Cath tip in the mid right atrium, stable.   04/02/2018 Imaging   CT AP  IMPRESSION: 1. Stable appearance of pancreatic head mass with chronic partial encasement of the celiac trunk and SMA and occlusion of the portal venous confluence. 2. No definite evidence for metastatic disease. Similar appearance of subtle soft tissue nodule posterior to the transverse colon without additional signs of peritoneal carcinomatosis. 3. Mild splenomegaly. 4.  Aortic Atherosclerosis (ICD10-I70.0).    08/14/2018 Imaging   CT CAP W Contrast 08/14/18    IMPRESSION: 1. Marked interval  progression of the pancreatic head mass, now measuring up to 5.2 cm in maximum dimension (compared to 2.2 cm maximum dimension previously). The superior mesenteric vein and splenic vein are occluded centrally at the portal splenic confluence. Tumor is contiguous with the posterior wall of the gastric antrum and medial wall of the duodenum, generating mass-effect on both. Tumor abuts both the distal celiac axis and SMA and both arteries remain patent. 2. Interval development of at least 3 hepatic metastases, measuring up to 3.0 cm in diameter. 3. Stable tiny bilateral pulmonary nodules, likely benign but attention on follow-up recommended. 4. Stable splenomegaly. 5.  Aortic Atherosclerois (ICD10-170.0)     08/26/2018 -  Chemotherapy   2nd line gemcitabine and abraxane 2 weeks on and 1 week off starting 08/26/18       CURRENT THERAPY:  -2nd line gemcitabine and abraxane 2 weeks on and 1 week off starting 08/26/18. Abraxane held due to hyperbilirubinemia starting 09/13/18.  -Promacta for thrombocytopenia, currently ay 55m daily. Increased to 1024mdaily on 05/06/18. Hold starting 09/13/18  INTERVAL HISTORY:  PeKALEIGH SPIEGELMANs here for a follow up and treatment. She presents to the clinic alone. She called her daughter-in-law to be included in the visit. She notes her balance is staggering today. She notes she her abdominal pain is manageable. She will take Tramadol only as needed. She notes her appetite is fair depending on the day due to taste change. She is eating 25% of what she usually eats. She drinks carnation drinks for supplement. She is still able to take care of herself and has the energy to do that.    REVIEW OF SYSTEMS:   Constitutional: Denies fevers, chills (+) Fair appetite, weight slowly trending down Eyes: Denies blurriness of vision Ears, nose, mouth, throat, and face: Denies mucositis or sore throat Respiratory: Denies cough, dyspnea or  wheezes Cardiovascular: Denies  palpitation, chest discomfort or lower extremity swelling Gastrointestinal:  Denies nausea, heartburn or change in bowel habits (+) Manageable abdominal pain  Skin: Denies abnormal skin rashes MSK: (+) staggering balance  Lymphatics: Denies new lymphadenopathy or easy bruising Neurological:Denies numbness, tingling or new weaknesses Behavioral/Psych: Mood is stable, no new changes  All other systems were reviewed with the patient and are negative.  MEDICAL HISTORY:  Past Medical History:  Diagnosis Date  . Allergic rhinitis    Skin Test 04-14-2009  . Asthma   . Cancer ([place]) 1981   adenocarcinoma of uterus  . [place] 2019   PANCREATIC   . Diabetes mellitus without complication ([place])    type II, no meds per pt  . Family history of breast cancer   . History of uterine cancer   . Hyperlipemia   . Hypertension   . Insomnia     SURGICAL HISTORY: Past Surgical History:  Procedure Laterality Date  . ABDOMINAL HYSTERECTOMY    . CARPAL TUNNEL RELEASE    . ESOPHAGOGASTRODUODENOSCOPY (EGD) WITH PROPOFOL N/A 09/11/2018   Procedure: ESOPHAGOGASTRODUODENOSCOPY (EGD) WITH PROPOFOL;  Surgeon: Carly Silence, MD;  Location: [facility];  Service: Endoscopy;  Laterality: N/A;  . EUS N/A 06/13/2017   Procedure: FULL UPPER ENDOSCOPIC ULTRASOUND (EUS) RADIAL;  Surgeon: Carly Silence, MD;  Location: [facility];  Service: Endoscopy;  Laterality: N/A;  . I&D EXTREMITY  09/17/2011   Procedure: IRRIGATION AND DEBRIDEMENT EXTREMITY;  Surgeon: Roseanne Kaufman, MD;  Location: [facility];  Service: Orthopedics;  Laterality: Left;  with drain placement  . IR FLUORO GUIDE PORT INSERTION RIGHT  07/18/2017  . IR US GUIDE VASC ACCESS RIGHT  07/18/2017  . NASAL SEPTOPLASTY W/ TURBINOPLASTY    . pelvic floor rebuild    . TONSILLECTOMY    . TOTAL ABDOMINAL HYSTERECTOMY W/ BILATERAL SALPINGOOPHORECTOMY      I have reviewed the social history and family history with the patient and they are unchanged from  previous note.  ALLERGIES:  is allergic to cephalexin and nabumetone.  MEDICATIONS:  Current Outpatient Medications  Medication Sig Dispense Refill  . ALPRAZolam (XANAX) 0.5 MG tablet Take 1 tablet (0.5 mg total) by mouth at bedtime as needed for anxiety. (Patient taking differently: Take 0.25 mg by mouth at bedtime as needed for anxiety. ) 90 tablet 0  . atorvastatin (LIPITOR) 10 MG tablet Take 10 mg by mouth at bedtime.     Marland Kitchen CALCIUM PO Take 1 tablet by mouth once a week.     . Cholecalciferol (VITAMIN D) 2000 units CAPS Take 1 capsule by mouth once a week.     . cyanocobalamin 2000 MCG tablet Take 2,000 mcg by mouth every evening.    . diphenoxylate-atropine (LOMOTIL) 2.5-0.025 MG tablet Take 1-2 tablets by mouth 4 (four) times daily as needed for diarrhea or loose stools. 60 tablet 1  . eltrombopag (PROMACTA) 50 MG tablet TAKE 2 TABLETS (100 MG) BY MOUTH EVERY DAY ON AN EMPTY STOMACH, 1 HOUR BEFORE OR 2 HOURS AFTER A MEAL (Patient taking differently: Take 100 mg by mouth daily at 2 am. TAKE 2 TABLETS (100 MG) BY MOUTH EVERY DAY ON AN EMPTY STOMACH, 1 HOUR BEFORE OR 2 HOURS AFTER A MEAL) 60 tablet 0  . hydrochlorothiazide (HYDRODIURIL) 25 MG tablet Take 25 mg by mouth daily as needed (swelling (typically whenever traveling to beach)).     Marland Kitchen HYDROcodone-acetaminophen (NORCO) 5-325 MG tablet Take 1 tablet by mouth every 6 (six) hours  as needed for moderate pain. 40 tablet 0  . hyoscyamine (LEVBID) 0.375 MG 12 hr tablet Take 0.375 mg by mouth every 12 (twelve) hours as needed for cramping.     Marland Kitchen levothyroxine (SYNTHROID, LEVOTHROID) 25 MCG tablet Take 25 mcg by mouth daily before breakfast.     . lidocaine-prilocaine (EMLA) cream Apply 1 application topically as needed. (Patient taking differently: Apply 1 application topically as needed (prior to port-a-cath being accessed). ) 30 g 2  . loratadine (CLARITIN) 10 MG tablet Take 10 mg by mouth daily.    . magic mouthwash w/lidocaine SOLN Take 5 mLs  by mouth 3 (three) times daily as needed for mouth pain. (Patient taking differently: Take 5 mLs by mouth 2 (two) times a day. ) 240 mL 0  . metFORMIN (GLUCOPHAGE) 500 MG tablet TAKE 1 TABLET BY MOUTH EVERY DAY WITH BREAKFAST 30 tablet 1  . omeprazole (PRILOSEC) 20 MG capsule Take 20 mg by mouth daily before breakfast.     . ondansetron (ZOFRAN) 8 MG tablet Take 1 tablet (8 mg total) by mouth every 8 (eight) hours as needed for nausea or vomiting. 30 tablet 1  . ONETOUCH DELICA LANCETS 26J MISC USE TO TEST YOUR BLOOD SUGAR ONCE A DAY FASTING AND 2 HRS AFTER A MEAL AS NEEDED  3  . ONETOUCH VERIO test strip USE TO TEST YOUR BLOOD SUGAR ONCE A DAY FASTING & 2 HRS AFTER A MEAL AS NEEDED  3  . potassium chloride SA (KLOR-CON M20) 20 MEQ tablet Take 1 tablet (20 mEq total) by mouth 2 (two) times daily. (Patient taking differently: Take 20 mEq by mouth daily. ) 60 tablet 1  . prochlorperazine (COMPAZINE) 10 MG tablet Take 1 tablet (10 mg total) by mouth every 6 (six) hours as needed for nausea or vomiting. 30 tablet 1  . ROCKLATAN 0.02-0.005 % SOLN Place 1 drop into both eyes at bedtime.   12  . traMADol (ULTRAM) 50 MG tablet Take 50 mg by mouth every 6 (six) hours as needed (pain).    . traZODone (DESYREL) 100 MG tablet TAKE 1 TABLET BY MOUTH EVERYDAY AT BEDTIME (Patient taking differently: Take 50 mg by mouth at bedtime. ) 90 tablet 1   No current facility-administered medications for this visit.     PHYSICAL EXAMINATION: ECOG PERFORMANCE STATUS: 1 - Symptomatic but completely ambulatory  Vitals:   09/13/18 0842  BP: (!) 145/77  Pulse: 98  Resp: 18  Temp: 98.7 F (37.1 C)  SpO2: 99%   Filed Weights   09/13/18 0842  Weight: 136 lb 1.6 oz (61.7 kg)    GENERAL:alert, no distress and comfortable SKIN: skin color, texture, turgor are normal, no rashes or significant lesions EYES: normal, Conjunctiva non-injected (+) Jaundice of eyes   NECK: supple, thyroid normal size, non-tender, without  nodularity LYMPH:  no palpable lymphadenopathy in the cervical, axillary  LUNGS: clear to auscultation and percussion with normal breathing effort HEART: regular rate & rhythm and no murmurs and no lower extremity edema ABDOMEN:abdomen soft, non-tender and normal bowel sounds Musculoskeletal:no cyanosis of digits and no clubbing  NEURO: alert & oriented x 3 with fluent speech, no focal motor/sensory deficits  LABORATORY DATA:  I have reviewed the data as listed CBC Latest Ref Rng & Units 09/13/2018 09/11/2018 09/02/2018  WBC 4.0 - 10.5 K/uL 6.3 4.8 4.2  Hemoglobin 12.0 - 15.0 g/dL 10.3(L) 10.6(L) 9.8(L)  Hematocrit 36.0 - 46.0 % 31.0(L) 32.8(L) 29.2(L)  Platelets 150 - 400 K/uL  270 221 54(L)     CMP Latest Ref Rng & Units 09/13/2018 09/02/2018 08/26/2018  Glucose 70 - 99 mg/dL 226(H) 212(H) 261(H)  BUN 8 - 23 mg/dL _0 Creatinine 0.44 - 1.00 mg/dL 0.69 0.73 0.75  Sodium 135 - 145 mmol/L 140 135 137  Potassium 3.5 - 5.1 mmol/L 4.4 4.5 4.3  Chloride 98 - 111 mmol/L 104 100 101  CO2 22 - 32 mmol/L _1 Calcium 8.9 - 10.3 mg/dL 9.7 8.9 9.4  Total Protein 6.5 - 8.1 g/dL 6.8 6.6 7.1  Total Bilirubin 0.3 - 1.2 mg/dL 5.9(HH) 3.3(H) 1.2  Alkaline Phos 38 - 126 U/L 757(H) 493(H) 458(H)  AST 15 - 41 U/L 123(H) 46(H) 155(H)  ALT 0 - 44 U/L 70(H) 46(H) 155(H)      RADIOGRAPHIC STUDIES: I have personally reviewed the radiological images as listed and agreed with the findings in the report. Dg C-arm 1-60 Min  Result Date: 09/11/2018 CLINICAL DATA:  Attempted ERCP. 6 seconds of fluoroscopy. Unable to cannulate wire past the ambulance. EXAM: DG C-ARM 61-120 MIN COMPARISON:  Ultrasound, 09/05/2018. FINDINGS: Single submitted image shows the endoscopy scope projecting in the duodenum. IMPRESSION: Single portable fluoroscopic image from an attempted ERCP. Electronically Signed   By: Lajean Manes M.D.   On: 09/11/2018 14:30     ASSESSMENT & PLAN:  Carly [name] is a 78 y.o. 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 Unfortunately this is unresectable tumor, [facility] Surgeon Dr. Zenia Resides. -Based on CT CAP from 08/14/18, she progressed on first lineChemo FOLFIRINOXthat she was on since 07/19/17.  -She started second line Gemcitabine/Abraxane on 08/26/18. She tolerated first cycle well with nausea. Will add Aloxi to Pre-meds starting with next cycle with Abraxane.  -Her labs from 09/02/18 showed Tbili 3.3. Today her eyes are more jaundiced and Tbili increased to 5.9. I discussed this is evident of bile duct obstruction secondary to her pancreatic and liver tumor. Dr. Paulita [name] has tried ERCP a few days ago which was not successful.  I have referred her to IR for PTC.  Will obtain a CT AP on 09/17/18 for PTC.  She agrees to proceed. -Again discussed her overall poor progression due to the recent disease progression and hyperbilirubinemia, her life expectancy will be likely a few months with no treatment. -I also discussed if we cannot resolve her bile obstruction or improve her liver function, she probably cannot continue with chemotherapy.  -We discussed palliative care and hospice, she is not ready yet. -Labs reviewed, CBC and CMP WNL except Hg 10.3, lymphocytes 0.6, BG 226, Albumin 2.5, AST 123, ALT 70, Alk Phos 757, Tbili 5.9. Ca 19.9 still pending. Overall adequate to proceed with Gemcitabine only and to hold Abraxane today given hyperbilirubinemia.  -She declined palliative care for more assistance at home at this time.  -F/u in 2 weeks   2. Transamanitis, Hyperbilirubinemia, obstructive jaundice   -Labs reviewed, AST 123, ALT 70, Alk Phos 757, Tbili 5.9 today. She also has jaundice of eyes today.  -Will proceed with CT scan on 09/17/18 and will proceed with IR procedure on 09/18/18 for PTC  drain placement     3. DM -Continue metformin. -BG at226today (09/13/18)  4. Diarrhea, abdominal pain, and weight loss -secondary to cancer and chemo, improved  -Pain previously resolved,recurred lately but overall mild. Diarrhea controlled.  -Her appetite has been fair lately due to taste change. She continues carnation breakfast for supplement so her weight is only slowly trending down. I encouraged her to maintain her weight.   5. H/oEndometrialCancer, Stage I, diagnosed in 1981 -Treated with hysterectomy and BSO in 1981. -f/u with OB/GYN and continue annual Pap smears. -Genetics was negative  6.Hypokalemia -Currently on Potassium 47mq daily, will continue -Kremains normal lately.  7.HTN, Hypothyroidism -f/u with PCP -BP at145/77 today (09/13/18)  8.Chemo induced thrombocytopenia -Improved with Promacta769mnd chemo dose reduction. -IncreasedPromacta on 05/05/28 due to decreased level and reduced chemo to every 3 weeks starting 05/20/18. -PLT normal today(09/13/18). -continue promacta for now   9.Goal of care discussion, DNR/DNI -Patient understands her cancer treatment is palliative, to prolong her life. -She has a living will. Code status and life-saving measures were discussed, and she wishes to be DNR now.   10. 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 -If she develops pain or tingling I will recommend Gabapentin for management. -Currently resolved.  Plan -Labs reviewed and adequate to proceed with Gemcitabine alone and hold Abraxane due to hyperbilirubinemia.  -hold Promacta for now.  -CT AP W contrast on 09/17/18  -IR biliary drain placement on 09/18/18  -Lab, flush, f/u  and chemo on 7/27, will add Abraxane back if her tbil comes down adequately  -I spoke with her son yesterday and her daughter-in-law on the phone today during her visit     No problem-specific Assessment & Plan notes found for this encounter.   No orders of the defined types were placed in this encounter.  All questions were answered. The patient knows to call the clinic with any problems, questions or concerns. No barriers to learning was detected. I spent 30 minutes counseling the patient face to face. The total time spent in the appointment was 40 minutes and more than 50% was on counseling and review of test results     YaTruitt MerleMD 09/13/2018   I, AmJoslyn Devonam acting as scribe for YaTruitt MerleMD.   I have reviewed the above documentation for accuracy and completeness, and I agree with the above.

## 2018-09-11 NOTE — Op Note (Signed)
[facility] Patient Name: Carly [name] Procedure Date: 09/11/2018 MRN: [id number] Attending MD: Arta Silence , MD Date of Birth: Jul 28, 1940 CSN: [id number] Age: 78 Admit Type: Inpatient Procedure:                Upper GI endoscopy (failed ERCP) Indications:              Abnormal ultrasound of the GI tract, metastatic                            pancreatic cancer, new-onset biliary ductal                            dilatation and jaundice and elevated liver enzymes Providers:                Arta Silence, MD, Cleda Daub, RN, Ladona Ridgel, Technician Referring MD:             Dr. Burr Medico (Oncology) Medicines:                General Anesthesia, Cipro 400 mg IV, Indomethacin PR Complications:            No immediate complications. Estimated Blood Loss:     Estimated blood loss: none. Procedure:                Pre-Anesthesia Assessment:                           - Prior to the procedure, a History and Physical                            was performed, and patient medications and                            allergies were reviewed. The patient's tolerance of                            previous anesthesia was also reviewed. The risks                            and benefits of the procedure and the sedation                            options and risks were discussed with the patient.                            All questions were answered, and informed consent                            was obtained. Prior Anticoagulants: The patient has                            taken no previous anticoagulant or antiplatelet  agents. ASA Grade Assessment: II - A patient with                            mild systemic disease. After reviewing the risks                            and benefits, the patient was deemed in                            satisfactory condition to undergo the procedure.                           The TJF-Q180V  [id number] Olympus duodenoscope was                            introduced through the mouth, and used to inject                            contrast into second part of duodenum. The upper GI                            endoscopy was performed with difficulty. The                            patient tolerated the procedure well. Scope In: Scope Out: Findings:      A large, ulcerated mass with no bleeding was found in the gastric antrum.      A medium-sized mass with no bleeding was found in the duodenal bulb.      Lots of deformity in antrum and and duodenal bulb due to tumor.       Difficult to get ampulla in great position with the side-viewing       duodenoscope. Unable to visualize and address the ampulla in a "short"       position. Some bile flow through ampulla due parts of our procedure.       Attempted procedure with small and large sphincterotome and with       different sized devices and wires. Unable to cannulate the bile duct       despite several attempts. 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                           - Duodenal mass.                           - No specimens collected. Moderate Sedation:      None Recommendation:           - Patient has a contact number available for                            emergencies. The signs and symptoms of potential                            delayed complications were discussed  with the                            patient. Return to normal activities tomorrow.                            Written discharge instructions were provided to the                            patient.                           - Discharge patient to home (via wheelchair).                           - Resume previous diet today.                           - Continue present medications.                           - Return to GI clinic PRN.                           - I have discussed management options with Dr. Burr Medico.                            - I will discuss further options with                            patient/family. Procedure Code(s):        --- Professional ---                           [phone], Esophagogastroduodenoscopy, flexible,                            transoral; diagnostic, including collection of                            specimen(s) by brushing or washing, when performed                            (separate procedure) Diagnosis Code(s):        --- Professional ---                           D49.0, Neoplasm of unspecified behavior of                            digestive system                           K31.89, Other diseases of stomach and duodenum                           R93.3, Abnormal findings on diagnostic imaging of  other parts of digestive tract CPT copyright 2019 [employer]. All rights reserved. The codes documented in this report are preliminary and upon coder review may  be revised to meet current compliance requirements. Arta Silence, MD 09/11/2018 12:30:40 PM This report has been signed electronically. Number of Addenda: 0

## 2018-09-11 NOTE — Anesthesia Preprocedure Evaluation (Addendum)
Anesthesia Evaluation  Patient identified by MRN, date of birth, ID band Patient awake    Reviewed: Allergy & Precautions, H&P , NPO status , Patient's Chart, lab work & pertinent test results  Airway Mallampati: III  TM Distance: >3 FB Neck ROM: Full    Dental no notable dental hx. (+) Edentulous Upper, Edentulous Lower, Dental Advisory Given   Pulmonary asthma , former smoker,    Pulmonary exam normal breath sounds clear to auscultation       Cardiovascular hypertension, Pt. on medications negative cardio ROS   Rhythm:Regular Rate:Normal     Neuro/Psych negative neurological ROS  negative psych ROS   GI/Hepatic negative GI ROS, Neg liver ROS,   Endo/Other  diabetes, Type 2, Oral Hypoglycemic AgentsHypothyroidism   Renal/GU negative Renal ROS  negative genitourinary   Musculoskeletal   Abdominal   Peds  Hematology negative hematology ROS (+)   Anesthesia Other Findings   Reproductive/Obstetrics negative OB ROS                            Anesthesia Physical Anesthesia Plan  ASA: II  Anesthesia Plan: General   Post-op Pain Management:    Induction: Intravenous  PONV Risk Score and Plan: 3 and Ondansetron and Treatment may vary due to age or medical condition  Airway Management Planned: Oral ETT  Additional Equipment:   Intra-op Plan:   Post-operative Plan: Extubation in OR  Informed Consent: I have reviewed the patients History and Physical, chart, labs and discussed the procedure including the risks, benefits and alternatives for the proposed anesthesia with the patient or authorized representative who has indicated his/her understanding and acceptance.     Dental advisory given  Plan Discussed with: CRNA  Anesthesia Plan Comments:         Anesthesia Quick Evaluation

## 2018-09-12 ENCOUNTER — Other Ambulatory Visit: Payer: Self-pay | Admitting: Hematology

## 2018-09-12 ENCOUNTER — Encounter (HOSPITAL_COMMUNITY): Payer: Self-pay | Admitting: Gastroenterology

## 2018-09-12 DIAGNOSIS — C250 Malignant neoplasm of head of pancreas: Secondary | ICD-10-CM

## 2018-09-13 ENCOUNTER — Inpatient Hospital Stay: Payer: Medicare Other

## 2018-09-13 ENCOUNTER — Inpatient Hospital Stay (HOSPITAL_BASED_OUTPATIENT_CLINIC_OR_DEPARTMENT_OTHER): Payer: Medicare Other | Admitting: Hematology

## 2018-09-13 ENCOUNTER — Other Ambulatory Visit: Payer: Self-pay

## 2018-09-13 ENCOUNTER — Other Ambulatory Visit: Payer: Self-pay | Admitting: Hematology

## 2018-09-13 VITALS — BP 145/77 | HR 98 | Temp 98.7°F | Resp 18 | Ht 62.0 in | Wt 136.1 lb

## 2018-09-13 DIAGNOSIS — Z95828 Presence of other vascular implants and grafts: Secondary | ICD-10-CM

## 2018-09-13 DIAGNOSIS — D6959 Other secondary thrombocytopenia: Secondary | ICD-10-CM | POA: Diagnosis not present

## 2018-09-13 DIAGNOSIS — C250 Malignant neoplasm of head of pancreas: Secondary | ICD-10-CM

## 2018-09-13 DIAGNOSIS — C787 Secondary malignant neoplasm of liver and intrahepatic bile duct: Secondary | ICD-10-CM

## 2018-09-13 DIAGNOSIS — Z452 Encounter for adjustment and management of vascular access device: Secondary | ICD-10-CM | POA: Diagnosis not present

## 2018-09-13 DIAGNOSIS — G629 Polyneuropathy, unspecified: Secondary | ICD-10-CM | POA: Diagnosis not present

## 2018-09-13 DIAGNOSIS — Z8542 Personal history of malignant neoplasm of other parts of uterus: Secondary | ICD-10-CM | POA: Diagnosis not present

## 2018-09-13 DIAGNOSIS — E876 Hypokalemia: Secondary | ICD-10-CM | POA: Diagnosis not present

## 2018-09-13 DIAGNOSIS — E039 Hypothyroidism, unspecified: Secondary | ICD-10-CM | POA: Diagnosis not present

## 2018-09-13 DIAGNOSIS — K831 Obstruction of bile duct: Secondary | ICD-10-CM

## 2018-09-13 DIAGNOSIS — I10 Essential (primary) hypertension: Secondary | ICD-10-CM | POA: Diagnosis not present

## 2018-09-13 DIAGNOSIS — E119 Type 2 diabetes mellitus without complications: Secondary | ICD-10-CM | POA: Diagnosis not present

## 2018-09-13 DIAGNOSIS — Z7189 Other specified counseling: Secondary | ICD-10-CM

## 2018-09-13 DIAGNOSIS — Z5111 Encounter for antineoplastic chemotherapy: Secondary | ICD-10-CM | POA: Diagnosis not present

## 2018-09-13 LAB — CMP (CANCER CENTER ONLY)
ALT: 70 U/L — ABNORMAL HIGH (ref 0–44)
AST: 123 U/L — ABNORMAL HIGH (ref 15–41)
Albumin: 2.5 g/dL — ABNORMAL LOW (ref 3.5–5.0)
Alkaline Phosphatase: 757 U/L — ABNORMAL HIGH (ref 38–126)
Anion gap: 11 (ref 5–15)
BUN: 14 mg/dL (ref 8–23)
CO2: 25 mmol/L (ref 22–32)
Calcium: 9.7 mg/dL (ref 8.9–10.3)
Chloride: 104 mmol/L (ref 98–111)
Creatinine: 0.69 mg/dL (ref 0.44–1.00)
GFR, Est AFR Am: >60 mL/min (ref >60)
GFR, Estimated: >60 mL/min (ref >60)
Glucose, Bld: 226 mg/dL — ABNORMAL HIGH (ref 70–99)
Potassium: 4.4 mmol/L (ref 3.5–5.1)
Sodium: 140 mmol/L (ref 135–145)
Total Bilirubin: 5.9 mg/dL (ref 0.3–1.2)
Total Protein: 6.8 g/dL (ref 6.5–8.1)

## 2018-09-13 LAB — CBC WITH DIFFERENTIAL (CANCER CENTER ONLY)
Abs Immature Granulocytes: 0.03 10*3/uL (ref 0.00–0.07)
Basophils Absolute: 0 10*3/uL (ref 0.0–0.1)
Basophils Relative: 0 %
Eosinophils Absolute: 0.1 10*3/uL (ref 0.0–0.5)
Eosinophils Relative: 2 %
HCT: 31 % — ABNORMAL LOW (ref 36.0–46.0)
Hemoglobin: 10.3 g/dL — ABNORMAL LOW (ref 12.0–15.0)
Immature Granulocytes: 1 %
Lymphocytes Relative: 9 %
Lymphs Abs: 0.6 10*3/uL — ABNORMAL LOW (ref 0.7–4.0)
MCH: 31.9 pg (ref 26.0–34.0)
MCHC: 33.2 g/dL (ref 30.0–36.0)
MCV: 96 fL (ref 80.0–100.0)
Monocytes Absolute: 0.7 10*3/uL (ref 0.1–1.0)
Monocytes Relative: 12 %
Neutro Abs: 4.8 10*3/uL (ref 1.7–7.7)
Neutrophils Relative %: 76 %
Platelet Count: 270 10*3/uL (ref 150–400)
RBC: 3.23 MIL/uL — ABNORMAL LOW (ref 3.87–5.11)
RDW: 15.3 % (ref 11.5–15.5)
WBC Count: 6.3 10*3/uL (ref 4.0–10.5)
nRBC: 0 % (ref 0.0–0.2)

## 2018-09-13 MED ORDER — HEPARIN SOD (PORK) LOCK FLUSH 100 UNIT/ML IV SOLN
500.0000 [IU] | Freq: Once | INTRAVENOUS | Status: AC | PRN
  Administered 2018-09-13: 500 [IU]
  Filled 2018-09-13: qty 5

## 2018-09-13 MED ORDER — SODIUM CHLORIDE 0.9 % IV SOLN
800.0000 mg/m2 | Freq: Once | INTRAVENOUS | Status: AC
  Administered 2018-09-13: 1330 mg via INTRAVENOUS
  Filled 2018-09-13: qty 34.98

## 2018-09-13 MED ORDER — PROCHLORPERAZINE MALEATE 10 MG PO TABS
ORAL_TABLET | ORAL | Status: AC
  Filled 2018-09-13: qty 1

## 2018-09-13 MED ORDER — PROCHLORPERAZINE MALEATE 10 MG PO TABS
10.0000 mg | ORAL_TABLET | Freq: Once | ORAL | Status: AC
  Administered 2018-09-13: 10:00:00 10 mg via ORAL

## 2018-09-13 MED ORDER — SODIUM CHLORIDE 0.9% FLUSH
10.0000 mL | INTRAVENOUS | Status: DC | PRN
  Administered 2018-09-13: 12:00:00 10 mL
  Filled 2018-09-13: qty 10

## 2018-09-13 MED ORDER — SODIUM CHLORIDE 0.9 % IV SOLN
Freq: Once | INTRAVENOUS | Status: AC
  Administered 2018-09-13: 10:00:00 via INTRAVENOUS
  Filled 2018-09-13: qty 250

## 2018-09-13 MED ORDER — SODIUM CHLORIDE 0.9% FLUSH
10.0000 mL | INTRAVENOUS | Status: DC | PRN
  Administered 2018-09-13: 08:00:00 10 mL
  Filled 2018-09-13: qty 10

## 2018-09-13 NOTE — Progress Notes (Signed)
CRITICAL VALUE STICKER  CRITICAL VALUE: Total Bilirubin 5.9  RECEIVER (on-site recipient of call):Macaela Presas Fountain NOTIFIED: 09/13/2018 1157  MESSENGER (representative from lab): Suanne Marker  MD NOTIFIED: Dr. Burr Medico  TIME OF NOTIFICATION: 2620  RESPONSE: no new orders at this time

## 2018-09-13 NOTE — Progress Notes (Signed)
Per Dr. Burr Medico okay to treat with total bilirubin 5.9, Gemcitabine only, no Abraxine, notified Georgette Dover RN in Infusion.

## 2018-09-13 NOTE — Patient Instructions (Signed)
[facility] Discharge Instructions for Patients Receiving Chemotherapy  Today you received the following chemotherapy agents:  Gemzar (gemcitibine)  To help prevent nausea and vomiting after your treatment, we encourage you to take your nausea medication as prescribed.   If you develop nausea and vomiting that is not controlled by your nausea medication, call the clinic.   BELOW ARE SYMPTOMS THAT SHOULD BE REPORTED IMMEDIATELY:  *FEVER GREATER THAN 100.5 F  *CHILLS WITH OR WITHOUT FEVER  NAUSEA AND VOMITING THAT IS NOT CONTROLLED WITH YOUR NAUSEA MEDICATION  *UNUSUAL SHORTNESS OF BREATH  *UNUSUAL BRUISING OR BLEEDING  TENDERNESS IN MOUTH AND THROAT WITH OR WITHOUT PRESENCE OF ULCERS  *URINARY PROBLEMS  *BOWEL PROBLEMS  UNUSUAL RASH Items with * indicate a potential emergency and should be followed up as soon as possible.  Feel free to call the clinic should you have any questions or concerns. The clinic phone number is [phone].  Please show the CHEMO ALERT CARD at check-in to the Emergency Department and triage nurse.   

## 2018-09-14 LAB — CANCER ANTIGEN 19-9: CA 19-9: 123 U/mL — ABNORMAL HIGH (ref 0–35)

## 2018-09-15 ENCOUNTER — Encounter: Payer: Self-pay | Admitting: Hematology

## 2018-09-15 NOTE — Addendum Note (Signed)
Addended by: Truitt Merle on: 09/15/2018 05:58 PM   Modules accepted: Orders

## 2018-09-16 ENCOUNTER — Telehealth: Payer: Self-pay | Admitting: Hematology

## 2018-09-16 ENCOUNTER — Other Ambulatory Visit: Payer: Self-pay

## 2018-09-16 ENCOUNTER — Telehealth: Payer: Self-pay

## 2018-09-16 DIAGNOSIS — C250 Malignant neoplasm of head of pancreas: Secondary | ICD-10-CM

## 2018-09-16 NOTE — Telephone Encounter (Signed)
Patient called requesting home health after she has her procedure (biliary drain) on Wednesday 09/18/2018, I faxed a referral along with OV 7/17, demographics and insurance card, order in Epic, to [facility] [phone], she also wanted to know who was doing her procedure.  I informed her Dr. Laurence Ferrari.

## 2018-09-16 NOTE — Telephone Encounter (Signed)
No los per 7/17. °

## 2018-09-17 ENCOUNTER — Other Ambulatory Visit: Payer: Self-pay | Admitting: Radiology

## 2018-09-17 ENCOUNTER — Other Ambulatory Visit: Payer: Self-pay

## 2018-09-17 ENCOUNTER — Ambulatory Visit (HOSPITAL_COMMUNITY)
Admission: RE | Admit: 2018-09-17 | Discharge: 2018-09-17 | Disposition: A | Discharge status: Home / Self Care | Payer: Medicare Other | Source: Ambulatory Visit | Attending: Hematology | Admitting: Hematology

## 2018-09-17 DIAGNOSIS — C801 Malignant (primary) neoplasm, unspecified: Secondary | ICD-10-CM | POA: Diagnosis not present

## 2018-09-17 DIAGNOSIS — C259 Malignant neoplasm of pancreas, unspecified: Secondary | ICD-10-CM | POA: Diagnosis not present

## 2018-09-17 DIAGNOSIS — C250 Malignant neoplasm of head of pancreas: Secondary | ICD-10-CM

## 2018-09-17 DIAGNOSIS — K831 Obstruction of bile duct: Secondary | ICD-10-CM | POA: Diagnosis not present

## 2018-09-17 DIAGNOSIS — Z1159 Encounter for screening for other viral diseases: Secondary | ICD-10-CM | POA: Diagnosis not present

## 2018-09-17 LAB — GUARDANT 360

## 2018-09-17 MED ORDER — IOHEXOL 300 MG/ML  SOLN
100.0000 mL | Freq: Once | INTRAMUSCULAR | Status: AC | PRN
  Administered 2018-09-17: 100 mL via INTRAVENOUS

## 2018-09-17 MED ORDER — SODIUM CHLORIDE (PF) 0.9 % IJ SOLN
INTRAMUSCULAR | Status: AC
  Filled 2018-09-17: qty 50

## 2018-09-18 ENCOUNTER — Encounter (HOSPITAL_COMMUNITY): Payer: Self-pay

## 2018-09-18 ENCOUNTER — Other Ambulatory Visit: Payer: Self-pay

## 2018-09-18 ENCOUNTER — Observation Stay (HOSPITAL_COMMUNITY)
Admission: RE | Admit: 2018-09-18 | Discharge: 2018-09-19 | Disposition: A | Discharge status: Home / Self Care | Payer: Medicare Other | Source: Ambulatory Visit | Attending: Interventional Radiology | Admitting: Interventional Radiology

## 2018-09-18 ENCOUNTER — Ambulatory Visit (HOSPITAL_COMMUNITY)
Admission: RE | Admit: 2018-09-18 | Discharge: 2018-09-18 | Disposition: A | Discharge status: Home / Self Care | Payer: Medicare Other | Source: Ambulatory Visit

## 2018-09-18 ENCOUNTER — Ambulatory Visit (HOSPITAL_COMMUNITY): Payer: Medicare Other

## 2018-09-18 DIAGNOSIS — K831 Obstruction of bile duct: Secondary | ICD-10-CM | POA: Diagnosis not present

## 2018-09-18 DIAGNOSIS — C250 Malignant neoplasm of head of pancreas: Secondary | ICD-10-CM

## 2018-09-18 DIAGNOSIS — C801 Malignant (primary) neoplasm, unspecified: Secondary | ICD-10-CM | POA: Diagnosis not present

## 2018-09-18 DIAGNOSIS — Z1159 Encounter for screening for other viral diseases: Secondary | ICD-10-CM | POA: Insufficient documentation

## 2018-09-18 HISTORY — PX: IR INT EXT BILIARY DRAIN WITH CHOLANGIOGRAM: IMG6044

## 2018-09-18 LAB — COMPREHENSIVE METABOLIC PANEL
ALT: 92 U/L — ABNORMAL HIGH (ref 0–44)
AST: 160 U/L — ABNORMAL HIGH (ref 15–41)
Albumin: 2.2 g/dL — ABNORMAL LOW (ref 3.5–5.0)
Alkaline Phosphatase: 723 U/L — ABNORMAL HIGH (ref 38–126)
Anion gap: 13 (ref 5–15)
BUN: 15 mg/dL (ref 8–23)
CO2: 23 mmol/L (ref 22–32)
Calcium: 9.3 mg/dL (ref 8.9–10.3)
Chloride: 98 mmol/L (ref 98–111)
Creatinine, Ser: 0.84 mg/dL (ref 0.44–1.00)
GFR calc Af Amer: >60 mL/min (ref >60)
GFR calc non Af Amer: >60 mL/min (ref >60)
Glucose, Bld: 162 mg/dL — ABNORMAL HIGH (ref 70–99)
Potassium: 3.8 mmol/L (ref 3.5–5.1)
Sodium: 134 mmol/L — ABNORMAL LOW (ref 135–145)
Total Bilirubin: 12.4 mg/dL — ABNORMAL HIGH (ref 0.3–1.2)
Total Protein: 6.8 g/dL (ref 6.5–8.1)

## 2018-09-18 LAB — CBC WITH DIFFERENTIAL/PLATELET
Abs Immature Granulocytes: 0.07 K/uL (ref 0.00–0.07)
Basophils Absolute: 0 K/uL (ref 0.0–0.1)
Basophils Relative: 0 %
Eosinophils Absolute: 0 K/uL (ref 0.0–0.5)
Eosinophils Relative: 1 %
HCT: 28.4 % — ABNORMAL LOW (ref 36.0–46.0)
Hemoglobin: 9.5 g/dL — ABNORMAL LOW (ref 12.0–15.0)
Immature Granulocytes: 2 %
Lymphocytes Relative: 9 %
Lymphs Abs: 0.4 K/uL — ABNORMAL LOW (ref 0.7–4.0)
MCH: 31.7 pg (ref 26.0–34.0)
MCHC: 33.5 g/dL (ref 30.0–36.0)
MCV: 94.7 fL (ref 80.0–100.0)
Monocytes Absolute: 0.4 K/uL (ref 0.1–1.0)
Monocytes Relative: 10 %
Neutro Abs: 3.3 K/uL (ref 1.7–7.7)
Neutrophils Relative %: 78 %
Platelets: 158 K/uL (ref 150–400)
RBC: 3 MIL/uL — ABNORMAL LOW (ref 3.87–5.11)
RDW: 16.1 % — ABNORMAL HIGH (ref 11.5–15.5)
WBC: 4.3 K/uL (ref 4.0–10.5)
nRBC: 0 % (ref 0.0–0.2)

## 2018-09-18 LAB — GLUCOSE, CAPILLARY: Glucose-Capillary: 158 mg/dL — ABNORMAL HIGH (ref 70–99)

## 2018-09-18 LAB — SARS CORONAVIRUS 2 BY RT PCR (HOSPITAL ORDER, PERFORMED IN ~~LOC~~ HOSPITAL LAB): SARS Coronavirus 2: NEGATIVE

## 2018-09-18 LAB — PROTIME-INR
INR: 1.4 — ABNORMAL HIGH (ref 0.8–1.2)
Prothrombin Time: 16.8 seconds — ABNORMAL HIGH (ref 11.4–15.2)

## 2018-09-18 MED ORDER — MIDAZOLAM HCL 2 MG/2ML IJ SOLN
INTRAMUSCULAR | Status: AC
  Filled 2018-09-18: qty 4

## 2018-09-18 MED ORDER — HYOSCYAMINE SULFATE ER 0.375 MG PO TB12
0.3750 mg | ORAL_TABLET | Freq: Two times a day (BID) | ORAL | Status: DC | PRN
  Filled 2018-09-18: qty 1

## 2018-09-18 MED ORDER — HYDROMORPHONE HCL 1 MG/ML IJ SOLN
INTRAMUSCULAR | Status: AC
  Filled 2018-09-18: qty 1

## 2018-09-18 MED ORDER — ENSURE ENLIVE PO LIQD
237.0000 mL | Freq: Two times a day (BID) | ORAL | Status: DC
  Administered 2018-09-19: 237 mL via ORAL

## 2018-09-18 MED ORDER — SODIUM CHLORIDE 0.9 % IV SOLN
INTRAVENOUS | Status: DC | PRN
  Administered 2018-09-18: 16:00:00 via INTRAVENOUS

## 2018-09-18 MED ORDER — MAGIC MOUTHWASH W/LIDOCAINE
5.0000 mL | Freq: Two times a day (BID) | ORAL | Status: DC
  Administered 2018-09-18 – 2018-09-19 (×2): 5 mL via ORAL
  Filled 2018-09-18 (×2): qty 5

## 2018-09-18 MED ORDER — MIDAZOLAM HCL 2 MG/2ML IJ SOLN
INTRAMUSCULAR | Status: AC | PRN
  Administered 2018-09-18 (×4): 1 mg via INTRAVENOUS

## 2018-09-18 MED ORDER — SODIUM CHLORIDE 0.9 % IV SOLN
2.0000 g | Freq: Once | INTRAVENOUS | Status: AC
  Administered 2018-09-18: 2 g via INTRAVENOUS
  Filled 2018-09-18: qty 2

## 2018-09-18 MED ORDER — PANTOPRAZOLE SODIUM 40 MG PO TBEC
40.0000 mg | DELAYED_RELEASE_TABLET | Freq: Every day | ORAL | Status: DC
  Administered 2018-09-18 – 2018-09-19 (×2): 40 mg via ORAL
  Filled 2018-09-18 (×2): qty 1

## 2018-09-18 MED ORDER — POTASSIUM CHLORIDE CRYS ER 20 MEQ PO TBCR
20.0000 meq | EXTENDED_RELEASE_TABLET | Freq: Every day | ORAL | Status: DC
  Administered 2018-09-18 – 2018-09-19 (×2): 20 meq via ORAL
  Filled 2018-09-18 (×2): qty 1

## 2018-09-18 MED ORDER — FENTANYL CITRATE (PF) 100 MCG/2ML IJ SOLN
INTRAMUSCULAR | Status: AC
  Filled 2018-09-18: qty 2

## 2018-09-18 MED ORDER — PROCHLORPERAZINE MALEATE 10 MG PO TABS: 10.0000 mg | ORAL_TABLET | Freq: Four times a day (QID) | ORAL | Status: DC | PRN

## 2018-09-18 MED ORDER — ONDANSETRON HCL 4 MG/2ML IJ SOLN: 4.0000 mg | Freq: Four times a day (QID) | INTRAMUSCULAR | Status: DC

## 2018-09-18 MED ORDER — ATORVASTATIN CALCIUM 10 MG PO TABS
10.0000 mg | ORAL_TABLET | Freq: Every day | ORAL | Status: DC
  Administered 2018-09-18: 21:00:00 10 mg via ORAL
  Filled 2018-09-18: qty 1

## 2018-09-18 MED ORDER — HYDROMORPHONE HCL 1 MG/ML IJ SOLN: 0.5000 mg | INTRAMUSCULAR | Status: DC | PRN

## 2018-09-18 MED ORDER — METFORMIN HCL 500 MG PO TABS
500.0000 mg | ORAL_TABLET | Freq: Once | ORAL | Status: AC
  Administered 2018-09-18: 500 mg via ORAL
  Filled 2018-09-18: qty 1

## 2018-09-18 MED ORDER — TRAZODONE HCL 50 MG PO TABS
50.0000 mg | ORAL_TABLET | Freq: Every day | ORAL | Status: DC
  Administered 2018-09-18: 50 mg via ORAL
  Filled 2018-09-18: qty 1

## 2018-09-18 MED ORDER — FENTANYL CITRATE (PF) 100 MCG/2ML IJ SOLN
INTRAMUSCULAR | Status: AC | PRN
  Administered 2018-09-18 (×2): 50 ug via INTRAVENOUS

## 2018-09-18 MED ORDER — METRONIDAZOLE IN NACL 5-0.79 MG/ML-% IV SOLN
500.0000 mg | INTRAVENOUS | Status: AC
  Administered 2018-09-18: 500 mg via INTRAVENOUS
  Filled 2018-09-18 (×2): qty 100

## 2018-09-18 MED ORDER — HYDROMORPHONE HCL 1 MG/ML IJ SOLN
INTRAMUSCULAR | Status: AC | PRN
  Administered 2018-09-18: 1 mg via INTRAVENOUS

## 2018-09-18 MED ORDER — LIDOCAINE HCL (PF) 1 % IJ SOLN
INTRAMUSCULAR | Status: AC | PRN
  Administered 2018-09-18: 10 mL

## 2018-09-18 MED ORDER — SODIUM CHLORIDE 0.9 % IV SOLN
INTRAVENOUS | Status: DC
  Administered 2018-09-18 (×2): via INTRAVENOUS

## 2018-09-18 MED ORDER — HYDROCHLOROTHIAZIDE 25 MG PO TABS: 25.0000 mg | ORAL_TABLET | Freq: Every day | ORAL | Status: DC | PRN

## 2018-09-18 MED ORDER — LIDOCAINE HCL 1 % IJ SOLN
INTRAMUSCULAR | Status: AC
  Filled 2018-09-18: qty 20

## 2018-09-18 MED ORDER — VITAMIN B-12 1000 MCG PO TABS
2000.0000 ug | ORAL_TABLET | Freq: Every evening | ORAL | Status: DC
  Administered 2018-09-18: 2000 ug via ORAL
  Filled 2018-09-18: qty 2

## 2018-09-18 MED ORDER — IOHEXOL 300 MG/ML  SOLN
100.0000 mL | Freq: Once | INTRAMUSCULAR | Status: AC | PRN
  Administered 2018-09-18: 20 mL

## 2018-09-18 MED ORDER — HYDROCODONE-ACETAMINOPHEN 5-325 MG PO TABS
1.0000 | ORAL_TABLET | ORAL | Status: DC | PRN
  Administered 2018-09-18: 1 via ORAL
  Administered 2018-09-18: 2 via ORAL
  Administered 2018-09-19: 1 via ORAL
  Filled 2018-09-18: qty 2
  Filled 2018-09-18 (×2): qty 1

## 2018-09-18 MED ORDER — LEVOTHYROXINE SODIUM 25 MCG PO TABS
25.0000 ug | ORAL_TABLET | Freq: Every day | ORAL | Status: DC
  Administered 2018-09-19: 25 ug via ORAL
  Filled 2018-09-18: qty 1

## 2018-09-18 MED ORDER — ELTROMBOPAG OLAMINE 50 MG PO TABS: 100.0000 mg | ORAL_TABLET | Freq: Every day | ORAL | Status: DC

## 2018-09-18 MED ORDER — NETARSUDIL-LATANOPROST 0.02-0.005 % OP SOLN
1.0000 [drp] | Freq: Every day | OPHTHALMIC | Status: DC
  Administered 2018-09-18: 1 [drp] via OPHTHALMIC

## 2018-09-18 NOTE — Sedation Documentation (Signed)
Patient is resting comfortably. 

## 2018-09-18 NOTE — H&P (Addendum)
Referring Physician(s): Feng,Yan  Supervising Physician: Jacqulynn Cadet  Patient Status:  [facility] OP TBA  Chief Complaint: Pancreatic cancer, biliary obstruction, jaundice   Subjective: Patient familiar to IR service from Port-A-Cath placement on 07/18/2017.  She has a history of pancreatic cancer diagnosed in 2019, status post chemotherapy.  She now has rising LFT's and CT abdomen pelvis yesterday revealing marked progression of pancreatic head mass with new metastatic nodularity in the perigastric fat, left omentum and transverse mesocolon.  In addition there is moderate to severe intra-and extrahepatic biliary duct dilatation with obliteration of the common bile duct in the head of the pancreas by tumor as well as progression of known liver metastases.  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  She is status post unsuccessful ERCP on 7/15.  She now presents for percutaneous transhepatic cholangiogram with biliary drain placement.  She currently denies fever, headache, chest pain, worsening dyspnea, cough, back pain, nausea, vomiting or bleeding.  She does have jaundice and some intermittent abdominal pain.  Additional PMH as below.   Past Medical History:  Diagnosis Date   Allergic rhinitis    Skin Test 04-14-2009   Asthma    Cancer [facility]) 1981   adenocarcinoma of uterus   Cancer ([place]) 2019   PANCREATIC    Diabetes mellitus without complication ([place])    type II, no meds per pt   Family history of breast cancer    History of uterine cancer    Hyperlipemia    Hypertension    Insomnia    Past Surgical History:  Procedure Laterality Date   ABDOMINAL HYSTERECTOMY     CARPAL TUNNEL RELEASE     ESOPHAGOGASTRODUODENOSCOPY (EGD) WITH PROPOFOL N/A 09/11/2018   Procedure: ESOPHAGOGASTRODUODENOSCOPY (EGD) WITH PROPOFOL;  Surgeon: Arta Silence, MD;  Location: [facility] ENDOSCOPY;   Service: Endoscopy;  Laterality: N/A;   EUS N/A 06/13/2017   Procedure: FULL UPPER ENDOSCOPIC ULTRASOUND (EUS) RADIAL;  Surgeon: Arta Silence, MD;  Location: [facility] ENDOSCOPY;  Service: Endoscopy;  Laterality: N/A;   I&D EXTREMITY  09/17/2011   Procedure: IRRIGATION AND DEBRIDEMENT EXTREMITY;  Surgeon: Roseanne Kaufman, MD;  Location: [facility];  Service: Orthopedics;  Laterality: Left;  with drain placement   IR FLUORO GUIDE PORT INSERTION RIGHT  07/18/2017   IR US GUIDE VASC ACCESS RIGHT  07/18/2017   NASAL SEPTOPLASTY W/ TURBINOPLASTY     pelvic floor rebuild     TONSILLECTOMY     TOTAL ABDOMINAL HYSTERECTOMY W/ BILATERAL SALPINGOOPHORECTOMY          Allergies: Cephalexin and Nabumetone  Medications: Prior to Admission medications   Medication Sig Start Date End Date Taking? Authorizing Provider  ALPRAZolam Duanne Moron) 0.5 MG tablet Take 1 tablet (0.5 mg total) by mouth at bedtime as needed for anxiety. Patient taking differently: Take 0.25 mg by mouth at bedtime as needed for anxiety.  06/17/18   Truitt Merle, MD  atorvastatin (LIPITOR) 10 MG tablet Take 10 mg by mouth at bedtime.     [provider]  CALCIUM PO Take 1 tablet by mouth once a week.     [provider]  Cholecalciferol (VITAMIN D) 2000 units CAPS Take 1 capsule by mouth once a week.     [provider]  cyanocobalamin 2000 MCG tablet Take 2,000 mcg by mouth every evening.    [provider]  diphenoxylate-atropine (LOMOTIL) 2.5-0.025 MG tablet Take 1-2 tablets by mouth 4 (four) times daily  as needed for diarrhea or loose stools. 02/04/18   Truitt Merle, MD  eltrombopag (PROMACTA) 50 MG tablet TAKE 2 TABLETS (100 MG) BY MOUTH EVERY DAY ON AN EMPTY STOMACH, 1 HOUR BEFORE OR 2 HOURS AFTER A MEAL Patient taking differently: Take 100 mg by mouth daily at 2 am. TAKE 2 TABLETS (100 MG) BY MOUTH EVERY DAY ON AN EMPTY STOMACH, 1 HOUR BEFORE OR 2 HOURS AFTER A MEAL 08/26/18   Truitt Merle, MD    hydrochlorothiazide (HYDRODIURIL) 25 MG tablet Take 25 mg by mouth daily as needed (swelling (typically whenever traveling to beach)).  11/29/11   [provider]  HYDROcodone-acetaminophen (NORCO) 5-325 MG tablet Take 1 tablet by mouth every 6 (six) hours as needed for moderate pain. 08/29/17   Truitt Merle, MD  hyoscyamine (LEVBID) 0.375 MG 12 hr tablet Take 0.375 mg by mouth every 12 (twelve) hours as needed for cramping.     [provider]  levothyroxine (SYNTHROID, LEVOTHROID) 25 MCG tablet Take 25 mcg by mouth daily before breakfast.     [provider]  lidocaine-prilocaine (EMLA) cream Apply 1 application topically as needed. Patient taking differently: Apply 1 application topically as needed (prior to port-a-cath being accessed).  09/02/18   Alla Feeling, NP  loratadine (CLARITIN) 10 MG tablet Take 10 mg by mouth daily.    [provider]  magic mouthwash w/lidocaine SOLN Take 5 mLs by mouth 3 (three) times daily as needed for mouth pain. Patient taking differently: Take 5 mLs by mouth 2 (two) times a day.  08/02/17   Alla Feeling, NP  metFORMIN (GLUCOPHAGE) 500 MG tablet TAKE 1 TABLET BY MOUTH EVERY DAY WITH BREAKFAST 09/10/18   Truitt Merle, MD  omeprazole (PRILOSEC) 20 MG capsule Take 20 mg by mouth daily before breakfast.  11/29/11   [provider]  ondansetron (ZOFRAN) 8 MG tablet Take 1 tablet (8 mg total) by mouth every 8 (eight) hours as needed for nausea or vomiting. 09/02/18   Alla Feeling, NP  ONETOUCH DELICA LANCETS 87F MISC USE TO TEST YOUR BLOOD SUGAR ONCE A DAY FASTING AND 2 HRS AFTER A MEAL AS NEEDED 04/26/17   [provider]  ONETOUCH VERIO test strip USE TO TEST YOUR BLOOD SUGAR ONCE A DAY FASTING & 2 HRS AFTER A MEAL AS NEEDED 04/26/17   [provider]  potassium chloride SA (KLOR-CON M20) 20 MEQ tablet Take 1 tablet (20 mEq total) by mouth 2 (two) times daily. Patient taking differently: Take 20 mEq by mouth daily.   05/06/18   Truitt Merle, MD  prochlorperazine (COMPAZINE) 10 MG tablet Take 1 tablet (10 mg total) by mouth every 6 (six) hours as needed for nausea or vomiting. 09/02/18   Alla Feeling, NP  ROCKLATAN 0.02-0.005 % SOLN Place 1 drop into both eyes at bedtime.  11/21/17   [provider]  traMADol (ULTRAM) 50 MG tablet Take 50 mg by mouth every 6 (six) hours as needed (pain).    [provider]  traZODone (DESYREL) 100 MG tablet TAKE 1 TABLET BY MOUTH EVERYDAY AT BEDTIME Patient taking differently: Take 50 mg by mouth at bedtime.  06/19/18   Princess Bruins, MD     Vital Signs:  blood pressure 149/71, heart rate 107, temp 98.1, respirations 20, O2 sat 97% room air   Physical Exam awake, alert.  Jaundiced/scleral icterus.  Chest clear to auscultation bilaterally.  Heart with slightly tachycardic but regular rhythm.  Abdomen soft,  positive bowel sounds, currently nontender.  Trace to 1+ pretibial edema bilaterally.  Imaging: Ct Abdomen Pelvis W Contrast  Result Date: 09/18/2018 CLINICAL DATA:  Pancreatic cancer. EXAM: CT ABDOMEN AND PELVIS WITH CONTRAST TECHNIQUE: Multidetector CT imaging of the abdomen and pelvis was performed using the standard protocol following bolus administration of intravenous contrast. CONTRAST:  123mL OMNIPAQUE IOHEXOL 300 MG/ML  SOLN COMPARISON:  04/02/2018 FINDINGS: Lower chest: Basilar atelectasis bilaterally. Hepatobiliary: 4.1 cm metastatic lesion in the dome of the liver has increased from 2.8 cm previously. 3.4 cm lesion posterior right liver (26/3) has increased from 2.3 cm previously. 9 mm metastatic lesion in the inferior right liver adjacent to the gallbladder fossa (27/3) is stable. 11 mm lesion posterior right liver on 17/3 was 9 mm previously. Interval development of moderate intrahepatic biliary duct dilatation. Extrahepatic common duct measures up to 17 mm diameter with abrupt termination in the head of pancreas. Gallbladder unremarkable.  Pancreas: Marked interval progression of the heterogeneous low-density pancreatic head mass, now measuring 8.1 x 6.6 cm (35/3) compared to 5.2 x 4.3 cm previously. As noted above, this obstructs the common bile duct in the head of the pancreas. Mass lesion generates substantial mass-effect on the gastric antrum and duodenum. Pancreatic body and talar atrophic with some dilatation of the main duct. Spleen: Mild splenomegaly. Adrenals/Urinary Tract: No adrenal nodule or mass. No enhancing mass lesion noted in either kidney. No substantial hydroureteronephrosis. The urinary bladder appears normal for the degree of distention. Stomach/Bowel: Decompressed. Marked mass-effect on the distal stomach due to the enlarging pancreatic head mass. Duodenum is nondilated. No small bowel wall thickening. No small bowel dilatation. The terminal ileum is normal. The appendix is normal. No gross colonic mass. No colonic wall thickening. Diverticular changes are noted in the left colon without evidence of diverticulitis. Vascular/Lymphatic: There is abdominal aortic atherosclerosis without aneurysm. 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 reconstitutes with extensive collateralization in the left abdomen and markedly prominent left renal vein. No para-aortic lymphadenopathy. No pelvic sidewall lymphadenopathy. Reproductive: The uterus is surgically absent. There is no adnexal mass. Other: Interval development of multiple soft tissue nodules in the perigastric fat around the antrum with apparent extension into the omentum. 13 mm perigastric nodule visible on 43/5. Omental nodularity visible on 38/5. No intraperitoneal free fluid. Musculoskeletal: No worrisome lytic or sclerotic osseous abnormality. IMPRESSION: 1. Marked interval progression of the pancreatic head mass in the 4 week interval since prior study. Lesion measures up to 8.1 cm diameter today compared to 5.2 cm  previously. Lesion generates substantial mass-effect on the posterior wall gastric antrum. 2. New metastatic nodularity in the perigastric fat, left omentum, and transverse mesocolon. 3. Interval development of moderate to severe intra and extrahepatic biliary duct dilatation with obliteration of the common bile duct in the head of the pancreas by the tumor. Correlation with liver function test recommended. 4. Interval progression of known liver metastases. 5. 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 6.  Aortic Atherosclerois (ICD10-170.0) These results will be called to the ordering clinician or representative by the Radiologist Assistant, and communication documented in the PACS or zVision Dashboard. Electronically Signed   By: Misty Stanley M.D.   On: 09/18/2018 10:12    Labs:  CBC: Recent Labs    09/02/18 1057 09/11/18 1016 09/13/18 0815 09/18/18 1118  WBC 4.2 4.8 6.3 4.3  HGB 9.8* 10.6* 10.3* 9.5*  HCT  29.2* 32.8* 31.0* 28.4*  PLT 54* 221 270 158    COAGS: No results for input(s): INR, APTT in the last 8760 hours.  BMP: Recent Labs    08/19/18 0945 08/26/18 1237 09/02/18 1057 09/13/18 0815  NA 138 137 135 140  K 4.5 4.3 4.5 4.4  CL 103 101 100 104  CO2 26 27 26 25   GLUCOSE 206* 261* 212* 226*  BUN 11 11 10 14   CALCIUM 9.6 9.4 8.9 9.7  CREATININE 0.91 0.75 0.73 0.69  GFRNONAA >60 >60 >60 >60  GFRAA >60 >60 >60 >60    LIVER FUNCTION TESTS: Recent Labs    08/19/18 0945 08/26/18 1237 09/02/18 1057 09/13/18 0815  BILITOT 0.4 1.2 3.3* 5.9*  AST 24 155* 46* 123*  ALT 15 155* 46* 70*  ALKPHOS 206* 458* 493* 757*  PROT 7.2 7.1 6.6 6.8  ALBUMIN 3.5 3.3* 2.5* 2.5*    Assessment and Plan: Pt with history of pancreatic cancer diagnosed in 2019, status post chemotherapy.  She now has rising LFT's and CT abdomen pelvis yesterday revealing marked progression of pancreatic head mass with  new metastatic nodularity in the perigastric fat, left omentum and transverse mesocolon.  In addition there is moderate to severe intra-and extrahepatic biliary duct dilatation with obliteration of the common bile duct in the head of the pancreas by tumor as well as progression of known liver metastases.  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  She is status post unsuccessful ERCP on 7/15.  She now presents for percutaneous transhepatic cholangiogram with biliary drain placement.  Imaging studies have been reviewed by Dr. Laurence Ferrari.  Details/risks of procedure, including but not limited to, internal bleeding, infection, injury to adjacent structures, discussed with patient with her understanding consent.  Post procedure she will be admitted for observation.   LABS PENDING   Electronically Signed: D. Rowe Robert, PA-C 09/18/2018, 11:52 AM   I spent a total of 30 minutes at the the patient's bedside AND on the patient's hospital floor or unit, greater than 50% of which was counseling/coordinating care for percutaneous transhepatic cholangiogram with biliary drain placement

## 2018-09-18 NOTE — Progress Notes (Signed)
Patient ID: Carly [name], female   DOB: 1941-02-19, 78 y.o.   MRN: [id number] Patient currently eating dinner.  Denies significant abdominal pain, nausea, vomiting.  Has not voided since early this a.m. Vital signs stable, afebrile Biliary drain intact, insertion site okay, site with minimal tenderness, blood-tinged bile in drain bag.  A/P: pancreatic cancer with malignant obstructive jaundice;  post internal/external biliary drain placement today; for overnight obs, check morning labs, if stable consider capping drain; follow-up in IR in 1week for attempted stent placement; [facility] consulted for assistance with medical management.

## 2018-09-18 NOTE — Procedures (Signed)
Interventional Radiology Procedure Note  Procedure: PTC with placement of a 54F internal/external biliary drain.   Complications: None  Estimated Blood Loss: None  Recommendations: - Admitted for observation - If doing well, cap drain and DC tomorrow  Signed,  Criselda Peaches, MD

## 2018-09-19 ENCOUNTER — Telehealth: Payer: Self-pay

## 2018-09-19 DIAGNOSIS — K831 Obstruction of bile duct: Secondary | ICD-10-CM | POA: Diagnosis not present

## 2018-09-19 DIAGNOSIS — C801 Malignant (primary) neoplasm, unspecified: Secondary | ICD-10-CM | POA: Diagnosis not present

## 2018-09-19 DIAGNOSIS — Z1159 Encounter for screening for other viral diseases: Secondary | ICD-10-CM | POA: Diagnosis not present

## 2018-09-19 DIAGNOSIS — C259 Malignant neoplasm of pancreas, unspecified: Secondary | ICD-10-CM | POA: Diagnosis not present

## 2018-09-19 LAB — COMPREHENSIVE METABOLIC PANEL
ALT: 81 U/L — ABNORMAL HIGH (ref 0–44)
AST: 127 U/L — ABNORMAL HIGH (ref 15–41)
Albumin: 2 g/dL — ABNORMAL LOW (ref 3.5–5.0)
Alkaline Phosphatase: 605 U/L — ABNORMAL HIGH (ref 38–126)
Anion gap: 7 (ref 5–15)
BUN: 14 mg/dL (ref 8–23)
CO2: 23 mmol/L (ref 22–32)
Calcium: 9 mg/dL (ref 8.9–10.3)
Chloride: 102 mmol/L (ref 98–111)
Creatinine, Ser: 0.91 mg/dL (ref 0.44–1.00)
GFR calc Af Amer: >60 mL/min (ref >60)
GFR calc non Af Amer: >60 mL/min (ref >60)
Glucose, Bld: 186 mg/dL — ABNORMAL HIGH (ref 70–99)
Potassium: 3.7 mmol/L (ref 3.5–5.1)
Sodium: 132 mmol/L — ABNORMAL LOW (ref 135–145)
Total Bilirubin: 5.4 mg/dL — ABNORMAL HIGH (ref 0.3–1.2)
Total Protein: 6.2 g/dL — ABNORMAL LOW (ref 6.5–8.1)

## 2018-09-19 LAB — CBC WITH DIFFERENTIAL/PLATELET
Abs Immature Granulocytes: 0.01 10*3/uL (ref 0.00–0.07)
Basophils Absolute: 0 10*3/uL (ref 0.0–0.1)
Basophils Relative: 0 %
Eosinophils Absolute: 0 10*3/uL (ref 0.0–0.5)
Eosinophils Relative: 0 %
HCT: 27.3 % — ABNORMAL LOW (ref 36.0–46.0)
Hemoglobin: 8.6 g/dL — ABNORMAL LOW (ref 12.0–15.0)
Immature Granulocytes: 0 %
Lymphocytes Relative: 13 %
Lymphs Abs: 0.4 10*3/uL — ABNORMAL LOW (ref 0.7–4.0)
MCH: 30.4 pg (ref 26.0–34.0)
MCHC: 31.5 g/dL (ref 30.0–36.0)
MCV: 96.5 fL (ref 80.0–100.0)
Monocytes Absolute: 0.5 10*3/uL (ref 0.1–1.0)
Monocytes Relative: 16 %
Neutro Abs: 2 10*3/uL (ref 1.7–7.7)
Neutrophils Relative %: 71 %
Platelets: 111 10*3/uL — ABNORMAL LOW (ref 150–400)
RBC: 2.83 MIL/uL — ABNORMAL LOW (ref 3.87–5.11)
RDW: 16.3 % — ABNORMAL HIGH (ref 11.5–15.5)
WBC: 2.9 10*3/uL — ABNORMAL LOW (ref 4.0–10.5)
nRBC: 0 % (ref 0.0–0.2)

## 2018-09-19 LAB — HEMOGLOBIN A1C
Hgb A1c MFr Bld: 7.1 % — ABNORMAL HIGH (ref 4.8–5.6)
Mean Plasma Glucose: 157.07 mg/dL

## 2018-09-19 MED ORDER — HEPARIN SOD (PORK) LOCK FLUSH 100 UNIT/ML IV SOLN
500.0000 [IU] | INTRAVENOUS | Status: AC | PRN
  Administered 2018-09-19: 500 [IU]

## 2018-09-19 MED ORDER — TRAMADOL HCL 50 MG PO TABS: 50.0000 mg | ORAL_TABLET | Freq: Four times a day (QID) | ORAL | Status: DC | PRN

## 2018-09-19 MED ORDER — DIPHENOXYLATE-ATROPINE 2.5-0.025 MG PO TABS: 1.0000 | ORAL_TABLET | Freq: Four times a day (QID) | ORAL | Status: DC | PRN

## 2018-09-19 MED ORDER — SODIUM CHLORIDE 0.9% FLUSH: 10.0000 mL | INTRAVENOUS | Status: DC | PRN

## 2018-09-19 MED ORDER — LORATADINE 10 MG PO TABS
10.0000 mg | ORAL_TABLET | Freq: Every day | ORAL | Status: DC
  Administered 2018-09-19: 10:00:00 10 mg via ORAL
  Filled 2018-09-19: qty 1

## 2018-09-19 NOTE — Telephone Encounter (Signed)
Patient calls concerning whether she is supposed to take her Promacta or not, spoke with Dr. Burr Medico, instructed patient to not take Promacta and we will see her in the office on Monday. She is having mild discomfort from her biliary drain placement yesterday which is normal.  She verbalized an undestanding.

## 2018-09-19 NOTE — Progress Notes (Signed)
[place]   [phone] [phone]   Clinic Follow up Note   Patient Care Team: [name], MD as PCP - General (Family Medicine) Arta Silence, MD as Consulting Physician (Gastroenterology) Truitt Merle, MD as Consulting Physician (Hematology)  Date of Service:  09/23/2018  CHIEF COMPLAINT: F/u of pancreatic cancer  SUMMARY OF ONCOLOGIC HISTORY: Oncology History Overview Note  Cancer Staging Pancreatic cancer [facility]) Staging form: Exocrine Pancreas, AJCC 8th Edition - Clinical stage from 06/13/2017: Stage III (cT4, cN0, cM0) - Signed by Truitt Merle, MD on 06/13/2017     Pancreatic cancer ([place])  06/08/2017 Imaging   06/08/2017 CT Abdomen IMPRESSION: Irregular solid hypoechoic mass within the pancreatic head, 2.6 x 2.2 cm with associated pancreatic ductal dilatation and pancreatic atrophy, most compatible with pancreatic cancer. 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  Diffuse colonic diverticulosis. Slight stranding around the distal descending colon may reflect early active diverticulitis.  Bilateral renal parapelvic cysts.  Aortic atherosclerosis.   06/13/2017 Initial Diagnosis   Pancreatic cancer ([place])   06/13/2017 Cancer Staging   Staging form: Exocrine Pancreas, AJCC 8th Edition - Clinical stage from 06/13/2017: Stage III (cT4, cN0, cM0) - Signed by Truitt Merle, MD on 06/13/2017   06/13/2017 Procedure   EUS by Dr. Paulita Fujita 06/13/17  IMPRESSION:  -There was no sign of significant pathology in the ampulla. - There was no evidence of significant pathology in the left lobe of the liver. - A mass was identified in the pancreatic head. [place] SMV; invasion portal confluence; no adenopathy. Fine needle aspiration performed. If this is a pancreatic adenocarcinoma, it would be staged T4/N0/Mx by EUS.   06/13/2017 Initial Biopsy   Diagnosis  06/13/17 FINE NEEDLE ASPIRATION, ENDOSCOPIC, PANCREAS HEAD (SPECIMEN 1 OF 1 COLLECTED 06/13/17): ADENOCARCINOMA. Preliminary Diagnosis Intraoperative Diagnosis: 1-3) ATYPICAL CELLS, ADDITIONAL MATERIAL REQUESTED. (NDK) 4-6 ADENOCARCINOMA. (NDK) Reported to Dr. Paulita Fujita on 06/13/17 @ 11:07am.    06/21/2017 Imaging   CT Chest without contrast IMPRESSION: 1. No findings suspicious for metastatic disease within the chest.  2. Tiny subpleural right lower lobe pulmonary nodule is nonspecific, but likely benign. This can be addressed on follow-up imaging.  3. Aortic Atherosclerosis (ICD10-I70.0).   07/19/2017 - 08/19/2018 Chemotherapy   Chemo FOLFIRINOX every 2 weeks starting 07/19/17 with dose reduction due to thrombocytopenia. Further reduced to every 3 weeks starting 05/20/18 due to thrombocytopenia. She had chemo break 06/19/18-07/27/18. D/c after cycle 21 on 08/19/18 due to disease progression.     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  The Hereditary Gene Panel offered by [facility] includes sequencing and/or deletion duplication testing of the following 47 genes: APC, ATM, AXIN2, BARD1, BMPR1A, BRCA1, BRCA2, BRIP1, CDH1, CDK4, CDKN2A (p14ARF), CDKN2A (p16INK4a), CHEK2, CTNNA1, DICER1, EPCAM (Deletion/duplication testing only), GREM1 (promoter region deletion/duplication testing only), KIT, MEN1, MLH1, MSH2, MSH3, MSH6, MUTYH, NBN, NF1, NHTL1, PALB2, PDGFRA, PMS2, POLD1, POLE, PTEN, RAD50, RAD51C, RAD51D, SDHB, SDHC, SDHD, SMAD4, SMARCA4. STK11, TP53, TSC1, TSC2, and VHL.  The following genes were evaluated for sequence changes only: SDHA and HOXB13 c.251G>A variant only. The report date is August 14, 2017.   09/24/2017 Progression   09/24/2017 CT CAP Pancreas: The mass involving the head of pancreas measures 2.5 x 3.8 by 3.1 cm. On the previous exam this measured 2.2 x 2.6 by 3.4 cm.   09/24/2017 Imaging   09/24/2017 CT CAP  IMPRESSION: 1. There is  been interval local progression of disease with increased size of head of pancreas neoplasm. There is progressive vascular involvement with partial encasement of the celiac trunk and SMA. Continued encasement and marked narrowing of the portal venous confluence. 2. No evidence for metastatic adenopathy within the abdomen, liver metastasis or pulmonary metastasis. 3. Aortic atherosclerosis and LAD coronary artery atherosclerotic calcifications. Aortic Atherosclerosis (ICD10-I70.0).   01/04/2018 Imaging   01/04/2018 CT CAP IMPRESSION: 1. No local disease progression identified. The pancreatic head mass is ill-defined and appears slightly smaller. There is chronic partial encasement of the celiac trunk and SMA and chronic short segment occlusion at the SMV/portal vein confluence. 2. No definite evidence of metastatic disease. Stable small hypervascular lesion inferiorly in the right hepatic lobe, likely an incidental vascular lesion. There is slightly greater mesenteric nodularity posterior to the transverse colon, but no other definite signs of peritoneal carcinomatosis. Attention on follow-up recommended. 3. Stable scattered tiny subpleural pulmonary nodules bilaterally, likely benign based on stability, although attention on follow-up recommended. 4. Mild splenomegaly. 5. Aortic Atherosclerosis (ICD10-I70.0). Port-A-Cath tip in the mid right atrium, stable.   04/02/2018 Imaging   CT AP  IMPRESSION: 1. Stable appearance of pancreatic head mass with chronic partial encasement of the celiac trunk and SMA and occlusion of the portal venous confluence. 2. No definite evidence for metastatic disease. Similar appearance of subtle soft tissue nodule posterior to the transverse colon without additional signs of peritoneal carcinomatosis. 3. Mild splenomegaly. 4.  Aortic Atherosclerosis (ICD10-I70.0).    08/14/2018 Imaging   CT CAP W Contrast 08/14/18    IMPRESSION: 1. Marked interval  progression of the pancreatic head mass, now measuring up to 5.2 cm in maximum dimension (compared to 2.2 cm maximum dimension previously). The superior mesenteric vein and splenic vein are occluded centrally at the portal splenic confluence. Tumor is contiguous with the posterior wall of the gastric antrum and medial wall of the duodenum, generating mass-effect on both. Tumor abuts both the distal celiac axis and SMA and both arteries remain patent. 2. Interval development of at least 3 hepatic metastases, measuring up to 3.0 cm in diameter. 3. Stable tiny bilateral pulmonary nodules, likely benign but attention on follow-up recommended. 4. Stable splenomegaly. 5.  Aortic Atherosclerois (ICD10-170.0)     08/26/2018 -  Chemotherapy   2nd line gemcitabine and abraxane 2 weeks on and 1 week off starting 08/26/18    09/17/2018 Imaging   CT AP W Contrast IMPRESSION: 1. Marked interval progression of the pancreatic head mass in the 4 week interval since prior study. Lesion measures up to 8.1 cm diameter today compared to 5.2 cm previously. Lesion generates substantial mass-effect on the posterior wall gastric antrum. 2. New metastatic nodularity in the perigastric fat, left omentum, and transverse mesocolon. 3. Interval development of moderate to severe intra and extrahepatic biliary duct dilatation with obliteration of the common bile duct in the head of the pancreas by the tumor. Correlation with liver function test recommended. 4. Interval progression of known liver metastases. 5. 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 6.  Aortic Atherosclerois (ICD10-170.0) 09/17/18       CURRENT THERAPY:  -2nd line gemcitabine and abraxane 2 weeks on and 1 week off starting 08/26/18. Abraxane held due to hyperbilirubinemia starting 09/13/18.  -Promacta for thrombocytopenia, currently ay 32m daily. Increased to  1061mdaily on 05/06/18. Hold starting 09/13/18. Will restart on 09/23/18.   INTERVAL HISTORY:  Carly [name] is here for a follow up. She presents to the clinic alone. She called her daughter in law to be included in the visit today. She notes since her bile duct procedure and draining tube placement she has RUQ pain with deep intakes. She notes she has nurse who keeps is clean and replaces bandage. She also notes it has been capped off and has not been drained yet. She is not sure if the tube is causing her pain or not. With deep breathing 8/10 and normally 5/10. She has been taking hydrocodone once a day before bed.  She notes her urine is not dark but still not clear. She notes having no appetite  and is fine not eating. She has been drinking carnation breakfast once daily and Gatorade.  She notes she was able tolerate single agent Gemcitabine well with no issues.    REVIEW OF SYSTEMS:   Constitutional: Denies fevers, chills (+) Anorexia and weight loss  Eyes: Denies blurriness of vision Ears, nose, mouth, throat, and face: Denies mucositis or sore throat Respiratory: Denies cough, dyspnea or wheezes Cardiovascular: Denies palpitation, chest discomfort or lower extremity swelling Gastrointestinal:  Denies nausea, heartburn or change in bowel habits (+) RUQ pain especially with dep breaths Skin: Denies abnormal skin rashes Lymphatics: Denies new lymphadenopathy or easy bruising Neurological:Denies numbness, tingling or new weaknesses Behavioral/Psych: Mood is stable, no new changes  All other systems were reviewed with the patient and are negative.  MEDICAL HISTORY:  Past Medical History:  Diagnosis Date   Allergic rhinitis    Skin Test 04-14-2009   Asthma    Cancer [facility]) 1981   adenocarcinoma of uterus   Cancer ([place]) 2019   PANCREATIC    Diabetes mellitus without complication ([place])    type II, no meds per pt   Family history of breast cancer    History of uterine cancer      Hyperlipemia    Hypertension    Insomnia     SURGICAL HISTORY: Past Surgical History:  Procedure Laterality Date   ABDOMINAL HYSTERECTOMY     CARPAL TUNNEL RELEASE     ESOPHAGOGASTRODUODENOSCOPY (EGD) WITH PROPOFOL N/A 09/11/2018   Procedure: ESOPHAGOGASTRODUODENOSCOPY (EGD) WITH PROPOFOL;  Surgeon: Arta Silence, MD;  Location: [facility];  Service: Endoscopy;  Laterality: N/A;   EUS N/A 06/13/2017   Procedure: FULL UPPER ENDOSCOPIC ULTRASOUND (EUS) RADIAL;  Surgeon: Arta Silence, MD;  Location: [facility];  Service: Endoscopy;  Laterality: N/A;   I&D EXTREMITY  09/17/2011   Procedure: IRRIGATION AND DEBRIDEMENT EXTREMITY;  Surgeon: Roseanne Kaufman, MD;  Location: [place];  Service: Orthopedics;  Laterality: Left;  with drain placement   IR FLUORO GUIDE PORT INSERTION RIGHT  07/18/2017   IR INT EXT BILIARY DRAIN WITH CHOLANGIOGRAM  09/18/2018   IR US GUIDE VASC ACCESS RIGHT  07/18/2017   NASAL SEPTOPLASTY W/ TURBINOPLASTY     pelvic floor rebuild     TONSILLECTOMY     TOTAL ABDOMINAL HYSTERECTOMY W/ BILATERAL SALPINGOOPHORECTOMY      I have reviewed the social history and family history with the patient and they are unchanged from previous note.  ALLERGIES:  is allergic to cephalexin and nabumetone.  MEDICATIONS:  Current Outpatient Medications  Medication Sig Dispense Refill   ALPRAZolam (XANAX) 0.5 MG tablet Take 1 tablet (0.5 mg total) by mouth at bedtime as needed for anxiety. (Patient taking differently: Take 0.25 mg by mouth at bedtime as needed for anxiety. ) 90 tablet 0  atorvastatin (LIPITOR) 10 MG tablet Take 10 mg by mouth at bedtime.      CALCIUM PO Take 1 tablet by mouth once a week.      Cholecalciferol (VITAMIN D) 2000 units CAPS Take 1 capsule by mouth once a week.      cyanocobalamin 2000 MCG tablet Take 2,000 mcg by mouth every evening.     diphenoxylate-atropine (LOMOTIL) 2.5-0.025 MG tablet Take 1-2 tablets by mouth 4 (four) times  daily as needed for diarrhea or loose stools. 60 tablet 1   hydrochlorothiazide (HYDRODIURIL) 25 MG tablet Take 25 mg by mouth daily as needed (swelling (typically whenever traveling to beach)).      HYDROcodone-acetaminophen (NORCO) 5-325 MG tablet Take 1 tablet by mouth every 6 (six) hours as needed for moderate pain. 40 tablet 0   hyoscyamine (LEVBID) 0.375 MG 12 hr tablet Take 0.375 mg by mouth every 12 (twelve) hours as needed for cramping.      levothyroxine (SYNTHROID, LEVOTHROID) 25 MCG tablet Take 25 mcg by mouth daily before breakfast.      lidocaine-prilocaine (EMLA) cream Apply 1 application topically as needed. (Patient taking differently: Apply 1 application topically as needed (prior to port-a-cath being accessed). ) 30 g 2   loratadine (CLARITIN) 10 MG tablet Take 10 mg by mouth daily.     metFORMIN (GLUCOPHAGE) 500 MG tablet TAKE 1 TABLET BY MOUTH EVERY DAY WITH BREAKFAST (Patient taking differently: Take 500 mg by mouth daily with breakfast. ) 30 tablet 1   omeprazole (PRILOSEC) 20 MG capsule Take 20 mg by mouth daily before breakfast.      ondansetron (ZOFRAN) 8 MG tablet Take 1 tablet (8 mg total) by mouth every 8 (eight) hours as needed for nausea or vomiting. 30 tablet 1   ONETOUCH DELICA LANCETS 67E MISC USE TO TEST YOUR BLOOD SUGAR ONCE A DAY FASTING AND 2 HRS AFTER A MEAL AS NEEDED  3   ONETOUCH VERIO test strip USE TO TEST YOUR BLOOD SUGAR ONCE A DAY FASTING & 2 HRS AFTER A MEAL AS NEEDED  3   potassium chloride SA (KLOR-CON M20) 20 MEQ tablet Take 1 tablet (20 mEq total) by mouth 2 (two) times daily. (Patient taking differently: Take 20 mEq by mouth daily. ) 60 tablet 1   prochlorperazine (COMPAZINE) 10 MG tablet Take 1 tablet (10 mg total) by mouth every 6 (six) hours as needed for nausea or vomiting. 30 tablet 1   ROCKLATAN 0.02-0.005 % SOLN Place 1 drop into both eyes at bedtime.   12   traMADol (ULTRAM) 50 MG tablet Take 50 mg by mouth every 6 (six) hours  as needed (pain).     traZODone (DESYREL) 100 MG tablet TAKE 1 TABLET BY MOUTH EVERYDAY AT BEDTIME (Patient taking differently: Take 50 mg by mouth at bedtime. ) 90 tablet 1   eltrombopag (PROMACTA) 50 MG tablet TAKE 2 TABLETS (100 MG) BY MOUTH EVERY DAY ON AN EMPTY STOMACH, 1 HOUR BEFORE OR 2 HOURS AFTER A MEAL (Patient not taking: Reported on 09/23/2018) 60 tablet 0   magic mouthwash w/lidocaine SOLN Take 5 mLs by mouth 3 (three) times daily as needed for mouth pain. 240 mL 1   megestrol (MEGACE ES) 625 MG/5ML suspension Take 5 mLs (625 mg total) by mouth daily. 150 mL 0   No current facility-administered medications for this visit.     PHYSICAL EXAMINATION: ECOG PERFORMANCE STATUS: 3 - Symptomatic, >50% confined to bed  Vitals:   09/23/18 9381  BP: (!) 150/77  Pulse: (!) 103  Resp: 18  Temp: 99.1 F (37.3 C)  SpO2: 99%   Filed Weights   09/23/18 0922  Weight: 135 lb 14.4 oz (61.6 kg)    GENERAL:alert, no distress and comfortable SKIN: texture, turgor are normal, no rashes or significant lesions (+) jaundice skin  EYES: normal, Conjunctiva are pink and non-injected (+) Jaundice of eyes   NECK: supple, thyroid normal size, non-tender, without nodularity LYMPH:  no palpable lymphadenopathy in the cervical, axillary  LUNGS: clear to auscultation and percussion with normal breathing effort HEART: regular rate & rhythm and no murmurs and no lower extremity edema ABDOMEN:abdomen soft, non-tender and normal bowel sounds Musculoskeletal:no cyanosis of digits and no clubbing  NEURO: alert & oriented x 3 with fluent speech, no focal motor/sensory deficits  LABORATORY DATA:  I have reviewed the data as listed CBC Latest Ref Rng & Units 09/23/2018 09/19/2018 09/18/2018  WBC 4.0 - 10.5 K/uL 8.0 2.9(L) 4.3  Hemoglobin 12.0 - 15.0 g/dL 9.7(L) 8.6(L) 9.5(L)  Hematocrit 36.0 - 46.0 % 29.4(L) 27.3(L) 28.4(L)  Platelets 150 - 400 K/uL 73(L) 111(L) 158     CMP Latest Ref Rng & Units  09/23/2018 09/19/2018 09/18/2018  Glucose 70 - 99 mg/dL 199(H) 186(H) 162(H)  BUN 8 - 23 mg/dL '10 14 15  ' Creatinine 0.44 - 1.00 mg/dL 0.73 0.91 0.84  Sodium 135 - 145 mmol/L 136 132(L) 134(L)  Potassium 3.5 - 5.1 mmol/L 3.9 3.7 3.8  Chloride 98 - 111 mmol/L 101 102 98  CO2 22 - 32 mmol/L '25 23 23  ' Calcium 8.9 - 10.3 mg/dL 10.1 9.0 9.3  Total Protein 6.5 - 8.1 g/dL 6.3(L) 6.2(L) 6.8  Total Bilirubin 0.3 - 1.2 mg/dL 5.2(HH) 5.4(H) 12.4(H)  Alkaline Phos 38 - 126 U/L 691(H) 605(H) 723(H)  AST 15 - 41 U/L 121(H) 127(H) 160(H)  ALT 0 - 44 U/L 80(H) 81(H) 92(H)      RADIOGRAPHIC STUDIES: I have personally reviewed the radiological images as listed and agreed with the findings in the report. No results found.   ASSESSMENT & PLAN:  AREN CHERNE is a 78 y.o. 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 Unfortunately this is unresectable tumor, [facility] Surgeon Dr. Zenia Resides. -Based on CT CAP from 08/14/18, she progressed on first lineChemo FOLFIRINOXthat she was on since 07/19/17.  -She started second line Gemcitabine/Abraxane on 08/26/18, did not tolerated first cycle well. Chemo postponed and changed to gemcitabine alone after cycle 1 due to worsening hyperbilirubinemia. She is s/p percutaneous biliary drainage last week -I discussed given her poor performance status, very low appetite, chemo will be difficult for her. We discussed hospice. She wish to continue chemo for now.  -I recommend palliative care, she declined for now. She is still adamant about taking care of herself. I advised her to use walker  with ambulating at all times to avoid falls. She understands.  -Labs reviewed, CBC and CMP WNL except Hg 9.7, PLT 73K, tbil 5.3. CA 19.9 still pending.  -Will proceed with C2D8 gemcitabine today, off chemo next week  -f/u in 2 weeks   2. Transamanitis, Hyperbilirubinemia, obstructive jaundice  -  secondary to pancreatic cancer and liver metastasis -She underwent IR procedure on 09/18/18 for PTC drain placement. Drain has been capped and not drained yet.  -IR plan to have bile duct stent placed on 09/25/18 to further improve her jaundice. May remove extremal draining tube in future    3. DM -Continue metformin. -continue monitoring   4. Diarrhea, abdominal pain, and weight loss, Anorexia  -secondary to cancer and chemo -Painpreviouslyresolved,recurred lately but overall mild.Diarrhea controlled.  -Her appetite has been fair lately due to taste change. She continues carnation breakfast for supplement so her weight is only slowly trending down. I encouraged her to maintain her weight.  -Since stent placement she still has RUQ pain, exacerbated but deep breathing (up to 8/10). Could be related to drain and her cancer progression. She has been taking hydrocodone once daily, she can increase up to q6hours.  -Lately she has no appetite and not eating any food. She has been taking carnation breakfast only once daily. I advised her to increase to at least 3-4 cans/bottles a day and to increase liquids such as soup or broth in her diet.  -I will set up dietician consult. -I recommend Megace to help stimulate her appetite (09/23/18). She is agreeable.   5. H/oEndometrialCancer, Stage I, diagnosed in 1981 -Treated with hysterectomy and BSO in 1981. -f/u with OB/GYN and continue annual Pap smears. -Genetics was negative  6.Hypokalemia -Currently on Potassium 21mq daily, will continue -Kremains normal lately.  7.HTN, Hypothyroidism -f/u with PCP -BP at150/77today (09/23/18) -I  encouraged her to remain of BP meds given low foot intake.   8.Chemo induced thrombocytopenia -Improved with Promacta758mnd chemo dose reduction. -IncreasedPromacta on 05/05/28 due to decreased level and reduced chemo to every 3 weeks starting 05/20/18. -PLT73Ktoday(09/23/18). -Held PrWelbyith recent high bilirubin. Will restart today (09/23/18).   9.Goal of care discussion, DNR/DNI -Patient understandsher cancer treatment is palliative, to prolong her life. -She has a living will. Code status and life-saving measures were discussed, and she wishes to be DNR now.  10. 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 -If she develops pain or tingling I will recommend Gabapentin for management. -Has resolved.  Plan -I called in Megace today and refilled Magic Mouthwash  -She will restart Promacta and increase hydrocodone  -Set up dietician consult  -Labs reviewed and adequate to proceed with Gemcitabine alone and hold Abraxane due to hyperbilirubinemia.  -IR biliary stent placement  on 09/25/18  -Lab, flush, f/u and chemo on 8/10, may add Abraxane back if her Tbili comes down adequately and her PS improves    No problem-specific Assessment & Plan notes found for this encounter.   No orders of the defined types were placed in this encounter.  All questions were answered. The patient knows to call the clinic with any problems, questions or concerns. No barriers to learning was detected. I spent 25 minutes counseling the patient face to face. The total time spent in the appointment was 30 minutes and more than 50% was on counseling and review of test results     YaTruitt MerleMD 09/23/2018   I, AmJoslyn Devonam acting as scribe for YaTruitt MerleMD.   I have reviewed the above documentation for accuracy and completeness, and I agree with the above.

## 2018-09-19 NOTE — Progress Notes (Signed)
Pt has been discharged home. Her son has picked her up. She was transported in a wheel chair with a nursing tech. I called her daughter in law Nehemiah Settle , reviewed the dc papers with her. DC papers are in an envelope and given to Ms Tworek.

## 2018-09-19 NOTE — Discharge Summary (Signed)
Patient ID: Carly [name] MRN: [id number] DOB/AGE: 05-16-1940 78 y.o.  Admit date: 09/18/2018 Discharge date: 09/19/2018  Supervising Physician: Jacqulynn Cadet  Patient Status: [facility] - In-pt  Admission Diagnoses: Biliary obstruction due to malignant neoplasm  Discharge Diagnoses:  Active Problems:   Biliary obstruction due to malignant neoplasm [facility])   Discharged Condition: stable  Hospital Course:  Patient presented to [facility] 09/19/2018 for an image-guided percutaneous transhepatic cholangiogram with placement of 10 Fr internal/external biliary drain. Procedure occurred without major complications and patient was transferred to floor in stable condition (VSS, drain stable) for overnight observation. No major events occurred overnight.  Patient awake and alert sitting in bed. Complains of tenderness at incision site, as expected. Biliary drain with 150 cc bilious fluid in gravity bag. Discussed with Dr. Laurence Ferrari who recommends capping drain at this time- instructed patient to remove cap/place gravity bag back onto drain if she notices increased RUQ pain, nausea/vomiting, or loss of appetite (new gravity bag given to patient). Plan to discharge home today and follow-up with Dr. Laurence Ferrari in IR at [facility] next Wednesday 09/25/2018 (procedure time 1500, arrive at 1330) for attempted stent placement.   The above has been discussed with patient's legal guardian/daughter-in-law, Carly [name], via telephone- all questions answered/concerns addressed.   Consults: None  Significant Diagnostic Studies: US Abdomen Complete  Result Date: 09/05/2018 CLINICAL DATA:  78 year old with hyperbilirubinemia. 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 EXAM: ABDOMEN ULTRASOUND COMPLETE COMPARISON:  CT abdomen and pelvis 08/14/2018 and earlier. FINDINGS: Gallbladder: Layering echogenic sludge. No shadowing gallstones. No gallbladder wall thickening or pericholecystic fluid. Negative  sonographic Murphy's sign according to the ultrasound technologist. Common bile duct: Diameter: Approximately 10 mm. The bile duct is obstructed by the mass involving the pancreatic head described below. Liver: Moderate intrahepatic biliary ductal dilation, new since the CT 3 weeks ago. Multiple hepatic parenchymal masses, better demonstrated on the prior CT. Geographic areas of hepatic steatosis. Portal vein is patent on color Doppler imaging with normal direction of blood flow towards the liver. IVC: Patent. Pancreas: Large mixed cystic and solid mass involving the head of the pancreas as noted on the prior CT, measuring at least 6.4 x 6.4 x 4.6 cm, more accurately sized on CT. Spleen: Borderline to mild splenomegaly with a volume of approximately 421 mL. No focal splenic parenchymal abnormality. Right Kidney: Length: Approximate 11.7 cm. No hydronephrosis. Well-preserved cortex. No shadowing calculi. Normal parenchymal echotexture. No focal parenchymal abnormality. Left Kidney: Length: Approximately 10.8 cm. 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 Well-preserved cortex. No shadowing calculi. Normal parenchymal echotexture. No focal parenchymal abnormality. Abdominal aorta: Normal in caliber throughout its visualized course in the abdomen with evidence of atherosclerosis. Maximum diameter 1.4 cm. Other findings: None. IMPRESSION: Since the CT abdomen and pelvis 3 weeks ago: 1. Interval development of moderate intrahepatic biliary ductal dilation. The common bile duct is dilated up to approximately 10 mm in diameter. 2. Mixed cystic and solid mass involving the head of the pancreas measuring just over 6 cm on ultrasound. Sizing is more accurate on CT. 3. Multiple hepatic metastases as noted on CT. 4. Stable borderline to mild splenomegaly. 5. 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 Electronically Signed   By: Evangeline Dakin M.D.   On: 09/05/2018 14:21   Ct Abdomen Pelvis W Contrast  Result Date: 09/18/2018 CLINICAL DATA:  Pancreatic cancer. EXAM: CT ABDOMEN AND PELVIS WITH  CONTRAST TECHNIQUE: Multidetector CT imaging of the abdomen and pelvis was performed using the standard protocol following bolus administration of intravenous contrast. CONTRAST:  1102m OMNIPAQUE IOHEXOL 300 MG/ML  SOLN COMPARISON:  04/02/2018 FINDINGS: Lower chest: Basilar atelectasis bilaterally. Hepatobiliary: 4.1 cm metastatic lesion in the dome of the liver has increased from 2.8 cm previously. 3.4 cm lesion posterior right liver (26/3) has increased from 2.3 cm previously. 9 mm metastatic lesion in the inferior right liver adjacent to the gallbladder fossa (27/3) is stable. 11 mm lesion posterior right liver on 17/3 was 9 mm previously. Interval development of moderate intrahepatic biliary duct dilatation. Extrahepatic common duct measures up to 17 mm diameter with abrupt termination in the head of pancreas. Gallbladder unremarkable. Pancreas: Marked interval progression of the heterogeneous low-density pancreatic head mass, now measuring 8.1 x 6.6 cm (35/3) compared to 5.2 x 4.3 cm previously. As noted above, this obstructs the common bile duct in the head of the pancreas. Mass lesion generates substantial mass-effect on the gastric antrum and duodenum. Pancreatic body and talar atrophic with some dilatation of the main duct. Spleen: Mild splenomegaly. Adrenals/Urinary Tract: No adrenal nodule or mass. No enhancing mass lesion noted in either kidney. No substantial hydroureteronephrosis. The urinary bladder appears normal for the degree of distention. Stomach/Bowel: Decompressed. Marked mass-effect on the distal stomach due to the enlarging pancreatic head mass. Duodenum is nondilated. No small bowel wall thickening. No small bowel dilatation. The terminal ileum is normal. The appendix is normal. No gross colonic mass. No colonic wall thickening.  Diverticular changes are noted in the left colon without evidence of diverticulitis. Vascular/Lymphatic: There is abdominal aortic atherosclerosis without aneurysm. 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 reconstitutes with extensive collateralization in the left abdomen and markedly prominent left renal vein. No para-aortic lymphadenopathy. No pelvic sidewall lymphadenopathy. Reproductive: The uterus is surgically absent. There is no adnexal mass. Other: Interval development of multiple soft tissue nodules in the perigastric fat around the antrum with apparent extension into the omentum. 13 mm perigastric nodule visible on 43/5. Omental nodularity visible on 38/5. No intraperitoneal free fluid. Musculoskeletal: No worrisome lytic or sclerotic osseous abnormality. IMPRESSION: 1. Marked interval progression of the pancreatic head mass in the 4 week interval since prior study. Lesion measures up to 8.1 cm diameter today compared to 5.2 cm previously. Lesion generates substantial mass-effect on the posterior wall gastric antrum. 2. New metastatic nodularity in the perigastric fat, left omentum, and transverse mesocolon. 3. Interval development of moderate to severe intra and extrahepatic biliary duct dilatation with obliteration of the common bile duct in the head of the pancreas by the tumor. Correlation with liver function test recommended. 4. Interval progression of known liver metastases. 5. 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 6.  Aortic Atherosclerois (ICD10-170.0) These results will be called to the ordering clinician or representative by the Radiologist Assistant, and communication documented in the PACS or zVision Dashboard. Electronically Signed   By: EMisty StanleyM.D.   On: 09/18/2018 10:12   Dg C-arm 1-60 Min  Result Date: 09/11/2018 CLINICAL DATA:   Attempted ERCP. 6 seconds of fluoroscopy. Unable to cannulate wire past the ambulance. EXAM: DG C-ARM 61-120 MIN COMPARISON:  Ultrasound, 09/05/2018. FINDINGS: Single submitted image shows the endoscopy scope projecting in the duodenum. IMPRESSION: Single portable fluoroscopic image from an attempted ERCP. Electronically Signed   By: DDedra SkeensD.  On: 09/11/2018 14:30   Ir Int Lianne Cure Biliary Drain With Cholangiogram  Result Date: 09/18/2018 INDICATION: 78 year old female with pancreatic cancer and malignant obstructed jaundice. She presents for percutaneous transhepatic cholangiogram and biliary drain placement. EXAM: PTC with internal/external biliary drain placement MEDICATIONS: 20 mg aztreonam, 500 mg Flagyl; The antibiotic was administered within an appropriate time frame prior to the initiation of the procedure. ANESTHESIA/SEDATION: Moderate (conscious) sedation was employed during this procedure. A total of Versed 4 mg and Fentanyl 100 mcg was administered intravenously. Moderate Sedation Time: 37 minutes. The patient's level of consciousness and vital signs were monitored continuously by radiology nursing throughout the procedure under my direct supervision. FLUOROSCOPY TIME:  Fluoroscopy Time: 12 minutes 54 seconds (238 mGy). COMPLICATIONS: None immediate. PROCEDURE: Informed written consent was obtained from the patient after a thorough discussion of the procedural risks, benefits and alternatives. All questions were addressed. Maximal Sterile Barrier Technique was utilized including caps, mask, sterile gowns, sterile gloves, sterile drape, hand hygiene and skin antiseptic. A timeout was performed prior to the initiation of the procedure. The right upper quadrant was interrogated with ultrasound. There is not a suitable window for ultrasound-guided percutaneous transhepatic cholangiogram. Therefore, an intercostal space in the mid aspect of the hepatic shadow in the mid axillary line was identified.  Local anesthesia was attained by infiltration with 1% lidocaine. Under fluoroscopic guidance, a 21 gauge Chiba needle was advanced into the liver. The needle was pulled back in till the tip was within a biliary radicle. A percutaneous transhepatic cholangiogram was then performed. There is significant dilation of the common hepatic and common bile ducts. No contrast material passes into the duodenum consistent with a complete malignant obstruction. The central intra and extrahepatic biliary ducts are widely dilated. The peripheral radicles are moderately dilated. A suitable peripheral radical of the right posterior biliary tree was localized. Using a second 21 gauge Chiba needle, the needle was used to puncture the bile duct under fluoroscopic guidance. A 0.018 wire was then advanced into the biliary radicle and further on into the common bile duct. The needle was removed. An Accustick sheath was then used to dilate the transhepatic tract. A road runner wire was then introduced through the Accustick sheath. The wire was successfully navigated through the obstruction and into the duodenum. The Accustick sheath was removed. The transhepatic tract was then dilated to 10 [place] using a soft tissue dilator. A Cook 30 [place] biliary drainage catheter was then advanced over the wire and formed with the locking loop in the duodenum and the proximal side hole in the common hepatic duct. There is excellent drainage of the biliary tree. A final contrast injection confirms catheter location. The catheter was secured to the skin with 0 Prolene suture. The catheter was connected to bag drainage. IMPRESSION: 1. Successful percutaneous transhepatic cholangiogram demonstrating complete malignant obstruction of the distal common bile duct and moderate biliary ductal dilatation. 2. Successful placement of a 10 French internal/external percutaneous biliary drain. The drain was left to gravity bag. PLAN: 1. Admit overnight for  observation. 2. Maintained to bag drainage overnight. 3. If doing well in the morning, drain can be capped. 4. Return to interventional Radiology in approximately 1 week for stent placement. Signed, Criselda Peaches, MD, [facility] Electronically Signed   By: Jacqulynn Cadet M.D.   On: 09/18/2018 15:10    Treatments: Image-guided internal/external biliary drain placement.  Discharge Exam: Blood pressure (!) 147/78, pulse 97, resp. rate Marland Kitchen)  27, height '5\' 2"'  (1.575 m), weight 137 lb 15.8 oz (62.6 kg), SpO2 93 %. Physical Exam Vitals signs and nursing note reviewed.  Constitutional:      General: She is not in acute distress.    Appearance: Normal appearance.  Cardiovascular:     Rate and Rhythm: Normal rate and regular rhythm.     Heart sounds: Normal heart sounds. No murmur.  Pulmonary:     Effort: Pulmonary effort is normal. No respiratory distress.     Breath sounds: Normal breath sounds. No wheezing.  Abdominal:     Comments: Biliary drain site with mild tenderness, no erythema, drainage, or active bleeding; approximately 150 cc bilious fluid in gravity bag; gravity bag removed and cap placed on drain.  Skin:    General: Skin is warm and dry.  Neurological:     Mental Status: She is alert and oriented to person, place, and time.  Psychiatric:        Mood and Affect: Mood normal.        Behavior: Behavior normal.        Thought Content: Thought content normal.        Judgment: Judgment normal.     Disposition: Discharge disposition: 01-Home or Self Care       Discharge Instructions    Call MD for:  difficulty breathing, headache or visual disturbances   Complete by: As directed    Call MD for:  extreme fatigue   Complete by: As directed    Call MD for:  hives   Complete by: As directed    Call MD for:  persistant dizziness or light-headedness   Complete by: As directed    Call MD for:  persistant  nausea and vomiting   Complete by: As directed    Call MD for:  redness, tenderness, or signs of infection (pain, swelling, redness, odor or green/yellow discharge around incision site)   Complete by: As directed    Call MD for:  severe uncontrolled pain   Complete by: As directed    Call MD for:  temperature >100.4   Complete by: As directed    Diet - low sodium heart healthy   Complete by: As directed    Discharge instructions   Complete by: As directed    Keep bag capped unless one of the following occurs: 1- increased RUQ pain 2- nausea/vomiting 3- loss of appetite  Plan to follow-up with Dr. Laurence Ferrari in IR at [facility] next Wednesday 09/25/2018, procedure time 1500 (arrive at 1330).   Increase activity slowly   Complete by: As directed      Allergies as of 09/19/2018      Reactions   Cephalexin Hives, Swelling   Nabumetone Hives, Swelling      Medication List    TAKE these medications   ALPRAZolam 0.5 MG tablet Commonly known as: XANAX Take 1 tablet (0.5 mg total) by mouth at bedtime as needed for anxiety. What changed: how much to take   atorvastatin 10 MG tablet Commonly known as: LIPITOR Take 10 mg by mouth at bedtime.   CALCIUM PO Take 1 tablet by mouth once a week.   cyanocobalamin 2000 MCG tablet Take 2,000 mcg by mouth every evening.   diphenoxylate-atropine 2.5-0.025 MG tablet Commonly known as: LOMOTIL Take 1-2 tablets by mouth 4 (four) times daily as needed for diarrhea or loose stools.   eltrombopag 50 MG tablet Commonly known as: Promacta TAKE 2 TABLETS (100 MG) BY MOUTH EVERY DAY  ON AN EMPTY STOMACH, 1 HOUR BEFORE OR 2 HOURS AFTER A MEAL What changed:   how much to take  how to take this  when to take this   hydrochlorothiazide 25 MG tablet Commonly known as: HYDRODIURIL Take 25 mg by mouth daily as needed (swelling (typically whenever traveling to beach)).   HYDROcodone-acetaminophen 5-325 MG tablet Commonly known as: Norco Take 1 tablet  by mouth every 6 (six) hours as needed for moderate pain.   hyoscyamine 0.375 MG 12 hr tablet Commonly known as: LEVBID Take 0.375 mg by mouth every 12 (twelve) hours as needed for cramping.   levothyroxine 25 MCG tablet Commonly known as: SYNTHROID Take 25 mcg by mouth daily before breakfast.   lidocaine-prilocaine cream Commonly known as: EMLA Apply 1 application topically as needed. What changed: reasons to take this   loratadine 10 MG tablet Commonly known as: CLARITIN Take 10 mg by mouth daily.   magic mouthwash w/lidocaine Soln Take 5 mLs by mouth 3 (three) times daily as needed for mouth pain. What changed: when to take this   metFORMIN 500 MG tablet Commonly known as: GLUCOPHAGE TAKE 1 TABLET BY MOUTH EVERY DAY WITH BREAKFAST What changed: See the new instructions.   omeprazole 20 MG capsule Commonly known as: PRILOSEC Take 20 mg by mouth daily before breakfast.   ondansetron 8 MG tablet Commonly known as: ZOFRAN Take 1 tablet (8 mg total) by mouth every 8 (eight) hours as needed for nausea or vomiting.   OneTouch Delica Lancets 04H Misc USE TO TEST YOUR BLOOD SUGAR ONCE A DAY FASTING AND 2 HRS AFTER A MEAL AS NEEDED   OneTouch Verio test strip Generic drug: glucose blood USE TO TEST YOUR BLOOD SUGAR ONCE A DAY FASTING & 2 HRS AFTER A MEAL AS NEEDED   potassium chloride SA 20 MEQ tablet Commonly known as: Klor-Con M20 Take 1 tablet (20 mEq total) by mouth 2 (two) times daily. What changed: when to take this   prochlorperazine 10 MG tablet Commonly known as: COMPAZINE Take 1 tablet (10 mg total) by mouth every 6 (six) hours as needed for nausea or vomiting.   Rocklatan 0.02-0.005 % Soln Generic drug: Netarsudil-Latanoprost Place 1 drop into both eyes at bedtime.   traMADol 50 MG tablet Commonly known as: ULTRAM Take 50 mg by mouth every 6 (six) hours as needed (pain).   traZODone 100 MG tablet Commonly known as: DESYREL TAKE 1 TABLET BY MOUTH  EVERYDAY AT BEDTIME What changed: See the new instructions.   Vitamin D 50 MCG (2000 UT) Caps Take 1 capsule by mouth once a week.         Electronically Signed: Earley Abide, PA-C 09/19/2018, 9:46 AM   I have spent Greater Than 30 Minutes discharging [place].

## 2018-09-20 ENCOUNTER — Other Ambulatory Visit (HOSPITAL_COMMUNITY): Payer: Self-pay | Admitting: Interventional Radiology

## 2018-09-20 DIAGNOSIS — C259 Malignant neoplasm of pancreas, unspecified: Secondary | ICD-10-CM

## 2018-09-22 DIAGNOSIS — Z90722 Acquired absence of ovaries, bilateral: Secondary | ICD-10-CM | POA: Diagnosis not present

## 2018-09-22 DIAGNOSIS — G893 Neoplasm related pain (acute) (chronic): Secondary | ICD-10-CM | POA: Diagnosis not present

## 2018-09-22 DIAGNOSIS — E1140 Type 2 diabetes mellitus with diabetic neuropathy, unspecified: Secondary | ICD-10-CM | POA: Diagnosis not present

## 2018-09-22 DIAGNOSIS — C250 Malignant neoplasm of head of pancreas: Secondary | ICD-10-CM | POA: Diagnosis not present

## 2018-09-22 DIAGNOSIS — Z803 Family history of malignant neoplasm of breast: Secondary | ICD-10-CM | POA: Diagnosis not present

## 2018-09-22 DIAGNOSIS — K831 Obstruction of bile duct: Secondary | ICD-10-CM | POA: Diagnosis not present

## 2018-09-22 DIAGNOSIS — C787 Secondary malignant neoplasm of liver and intrahepatic bile duct: Secondary | ICD-10-CM | POA: Diagnosis not present

## 2018-09-22 DIAGNOSIS — Z4803 Encounter for change or removal of drains: Secondary | ICD-10-CM | POA: Diagnosis not present

## 2018-09-22 DIAGNOSIS — Z79891 Long term (current) use of opiate analgesic: Secondary | ICD-10-CM | POA: Diagnosis not present

## 2018-09-22 DIAGNOSIS — J45909 Unspecified asthma, uncomplicated: Secondary | ICD-10-CM | POA: Diagnosis not present

## 2018-09-22 DIAGNOSIS — Z8541 Personal history of malignant neoplasm of cervix uteri: Secondary | ICD-10-CM | POA: Diagnosis not present

## 2018-09-22 DIAGNOSIS — D6959 Other secondary thrombocytopenia: Secondary | ICD-10-CM | POA: Diagnosis not present

## 2018-09-22 DIAGNOSIS — Z79899 Other long term (current) drug therapy: Secondary | ICD-10-CM | POA: Diagnosis not present

## 2018-09-22 DIAGNOSIS — T451X5A Adverse effect of antineoplastic and immunosuppressive drugs, initial encounter: Secondary | ICD-10-CM | POA: Diagnosis not present

## 2018-09-22 DIAGNOSIS — Z7984 Long term (current) use of oral hypoglycemic drugs: Secondary | ICD-10-CM | POA: Diagnosis not present

## 2018-09-22 DIAGNOSIS — E785 Hyperlipidemia, unspecified: Secondary | ICD-10-CM | POA: Diagnosis not present

## 2018-09-22 DIAGNOSIS — Z90710 Acquired absence of both cervix and uterus: Secondary | ICD-10-CM | POA: Diagnosis not present

## 2018-09-22 DIAGNOSIS — E876 Hypokalemia: Secondary | ICD-10-CM | POA: Diagnosis not present

## 2018-09-22 DIAGNOSIS — I10 Essential (primary) hypertension: Secondary | ICD-10-CM | POA: Diagnosis not present

## 2018-09-23 ENCOUNTER — Encounter: Payer: Self-pay | Admitting: Hematology

## 2018-09-23 ENCOUNTER — Other Ambulatory Visit: Payer: Self-pay

## 2018-09-23 ENCOUNTER — Inpatient Hospital Stay (HOSPITAL_BASED_OUTPATIENT_CLINIC_OR_DEPARTMENT_OTHER): Payer: Medicare Other | Admitting: Hematology

## 2018-09-23 ENCOUNTER — Inpatient Hospital Stay: Payer: Medicare Other

## 2018-09-23 ENCOUNTER — Telehealth: Payer: Self-pay

## 2018-09-23 ENCOUNTER — Telehealth: Payer: Self-pay | Admitting: Hematology

## 2018-09-23 VITALS — BP 150/77 | HR 103 | Temp 99.1°F | Resp 18 | Ht 62.0 in | Wt 135.9 lb

## 2018-09-23 VITALS — HR 98

## 2018-09-23 DIAGNOSIS — Z95828 Presence of other vascular implants and grafts: Secondary | ICD-10-CM

## 2018-09-23 DIAGNOSIS — K831 Obstruction of bile duct: Secondary | ICD-10-CM

## 2018-09-23 DIAGNOSIS — Z7189 Other specified counseling: Secondary | ICD-10-CM

## 2018-09-23 DIAGNOSIS — C250 Malignant neoplasm of head of pancreas: Secondary | ICD-10-CM

## 2018-09-23 DIAGNOSIS — C801 Malignant (primary) neoplasm, unspecified: Secondary | ICD-10-CM

## 2018-09-23 LAB — CMP (CANCER CENTER ONLY)
ALT: 80 U/L — ABNORMAL HIGH (ref 0–44)
AST: 121 U/L — ABNORMAL HIGH (ref 15–41)
Albumin: 2 g/dL — ABNORMAL LOW (ref 3.5–5.0)
Alkaline Phosphatase: 691 U/L — ABNORMAL HIGH (ref 38–126)
Anion gap: 10 (ref 5–15)
BUN: 10 mg/dL (ref 8–23)
CO2: 25 mmol/L (ref 22–32)
Calcium: 10.1 mg/dL (ref 8.9–10.3)
Chloride: 101 mmol/L (ref 98–111)
Creatinine: 0.73 mg/dL (ref 0.44–1.00)
GFR, Est AFR Am: >60 mL/min (ref >60)
GFR, Estimated: >60 mL/min (ref >60)
Glucose, Bld: 199 mg/dL — ABNORMAL HIGH (ref 70–99)
Potassium: 3.9 mmol/L (ref 3.5–5.1)
Sodium: 136 mmol/L (ref 135–145)
Total Bilirubin: 5.2 mg/dL (ref 0.3–1.2)
Total Protein: 6.3 g/dL — ABNORMAL LOW (ref 6.5–8.1)

## 2018-09-23 LAB — CBC WITH DIFFERENTIAL (CANCER CENTER ONLY)
Abs Immature Granulocytes: 0.08 10*3/uL — ABNORMAL HIGH (ref 0.00–0.07)
Basophils Absolute: 0 10*3/uL (ref 0.0–0.1)
Basophils Relative: 0 %
Eosinophils Absolute: 0 10*3/uL (ref 0.0–0.5)
Eosinophils Relative: 0 %
HCT: 29.4 % — ABNORMAL LOW (ref 36.0–46.0)
Hemoglobin: 9.7 g/dL — ABNORMAL LOW (ref 12.0–15.0)
Immature Granulocytes: 1 %
Lymphocytes Relative: 9 %
Lymphs Abs: 0.7 10*3/uL (ref 0.7–4.0)
MCH: 31.6 pg (ref 26.0–34.0)
MCHC: 33 g/dL (ref 30.0–36.0)
MCV: 95.8 fL (ref 80.0–100.0)
Monocytes Absolute: 1.1 10*3/uL — ABNORMAL HIGH (ref 0.1–1.0)
Monocytes Relative: 14 %
Neutro Abs: 6 10*3/uL (ref 1.7–7.7)
Neutrophils Relative %: 76 %
Platelet Count: 73 10*3/uL — ABNORMAL LOW (ref 150–400)
RBC: 3.07 MIL/uL — ABNORMAL LOW (ref 3.87–5.11)
RDW: 17.4 % — ABNORMAL HIGH (ref 11.5–15.5)
WBC Count: 8 10*3/uL (ref 4.0–10.5)
nRBC: 0 % (ref 0.0–0.2)

## 2018-09-23 MED ORDER — HEPARIN SOD (PORK) LOCK FLUSH 100 UNIT/ML IV SOLN
500.0000 [IU] | Freq: Once | INTRAVENOUS | Status: AC | PRN
  Administered 2018-09-23: 500 [IU]
  Filled 2018-09-23: qty 5

## 2018-09-23 MED ORDER — PROCHLORPERAZINE MALEATE 10 MG PO TABS
ORAL_TABLET | ORAL | Status: AC
  Filled 2018-09-23: qty 1

## 2018-09-23 MED ORDER — PROCHLORPERAZINE MALEATE 10 MG PO TABS
10.0000 mg | ORAL_TABLET | Freq: Once | ORAL | Status: AC
  Administered 2018-09-23: 10 mg via ORAL

## 2018-09-23 MED ORDER — MEGESTROL ACETATE 625 MG/5ML PO SUSP: 625.0000 mg | Freq: Every day | ORAL | 0 refills | Status: DC

## 2018-09-23 MED ORDER — SODIUM CHLORIDE 0.9% FLUSH
10.0000 mL | INTRAVENOUS | Status: DC | PRN
  Administered 2018-09-23: 09:00:00 10 mL
  Filled 2018-09-23: qty 10

## 2018-09-23 MED ORDER — SODIUM CHLORIDE 0.9% FLUSH
10.0000 mL | INTRAVENOUS | Status: DC | PRN
  Administered 2018-09-23: 10 mL
  Filled 2018-09-23: qty 10

## 2018-09-23 MED ORDER — DEXAMETHASONE SODIUM PHOSPHATE 10 MG/ML IJ SOLN
INTRAMUSCULAR | Status: AC
  Filled 2018-09-23: qty 1

## 2018-09-23 MED ORDER — SODIUM CHLORIDE 0.9 % IV SOLN
800.0000 mg/m2 | Freq: Once | INTRAVENOUS | Status: AC
  Administered 2018-09-23: 1330 mg via INTRAVENOUS
  Filled 2018-09-23: qty 34.98

## 2018-09-23 MED ORDER — PALONOSETRON HCL INJECTION 0.25 MG/5ML
INTRAVENOUS | Status: AC
  Filled 2018-09-23: qty 5

## 2018-09-23 MED ORDER — PALONOSETRON HCL INJECTION 0.25 MG/5ML
0.2500 mg | Freq: Once | INTRAVENOUS | Status: AC
  Administered 2018-09-23: 0.25 mg via INTRAVENOUS

## 2018-09-23 MED ORDER — MAGIC MOUTHWASH W/LIDOCAINE: 5.0000 mL | Freq: Three times a day (TID) | ORAL | 1 refills | Status: AC | PRN

## 2018-09-23 MED ORDER — SODIUM CHLORIDE 0.9 % IV SOLN: 10.0000 mg | Freq: Once | INTRAVENOUS | Status: DC

## 2018-09-23 MED ORDER — DEXAMETHASONE SODIUM PHOSPHATE 10 MG/ML IJ SOLN
10.0000 mg | Freq: Once | INTRAMUSCULAR | Status: AC
  Administered 2018-09-23: 11:00:00 10 mg via INTRAVENOUS

## 2018-09-23 MED ORDER — SODIUM CHLORIDE 0.9 % IV SOLN
Freq: Once | INTRAVENOUS | Status: AC
  Administered 2018-09-23: 11:00:00 via INTRAVENOUS
  Filled 2018-09-23: qty 250

## 2018-09-23 NOTE — Telephone Encounter (Signed)
Scheduled appt per 7/27 los.  Spoke with patient and she is aware of her nutrition appt being a phone visit only.

## 2018-09-23 NOTE — Progress Notes (Signed)
CRITICAL VALUE STICKER  CRITICAL VALUE:  Bilirubin 5.2  RECEIVER (on-site recipient of call): Valda Favia RN  [place] NOTIFIED: 09/23/2018 10:00   MESSENGER (representative from lab): Carver Fila  MD NOTIFIED: Dr. Burr Medico  TIME OF NOTIFICATION:10:05  RESPONSE: No new orders at this time.

## 2018-09-23 NOTE — Telephone Encounter (Signed)
Faxed signed orders back to [facility], sent to HIM for scan to chart.

## 2018-09-23 NOTE — Progress Notes (Signed)
Per MD, dose will remain 800mg /m2 for this treatment due to elevated lab levels

## 2018-09-23 NOTE — Progress Notes (Signed)
Per Dr. Burr Medico okay to treat with lab results from today (platelets, AST and Total Bilirubin).

## 2018-09-23 NOTE — Patient Instructions (Signed)
[facility] Discharge Instructions for Patients Receiving Chemotherapy  Today you received the following chemotherapy agents:  Gemzar (gemcitibine)  To help prevent nausea and vomiting after your treatment, we encourage you to take your nausea medication as prescribed.   If you develop nausea and vomiting that is not controlled by your nausea medication, call the clinic.   BELOW ARE SYMPTOMS THAT SHOULD BE REPORTED IMMEDIATELY:  *FEVER GREATER THAN 100.5 F  *CHILLS WITH OR WITHOUT FEVER  NAUSEA AND VOMITING THAT IS NOT CONTROLLED WITH YOUR NAUSEA MEDICATION  *UNUSUAL SHORTNESS OF BREATH  *UNUSUAL BRUISING OR BLEEDING  TENDERNESS IN MOUTH AND THROAT WITH OR WITHOUT PRESENCE OF ULCERS  *URINARY PROBLEMS  *BOWEL PROBLEMS  UNUSUAL RASH Items with * indicate a potential emergency and should be followed up as soon as possible.  Feel free to call the clinic should you have any questions or concerns. The clinic phone number is [phone].  Please show the CHEMO ALERT CARD at check-in to the Emergency Department and triage nurse.   

## 2018-09-24 ENCOUNTER — Other Ambulatory Visit: Payer: Self-pay | Admitting: Radiology

## 2018-09-24 LAB — CANCER ANTIGEN 19-9: CA 19-9: 144 U/mL — ABNORMAL HIGH (ref 0–35)

## 2018-09-25 ENCOUNTER — Ambulatory Visit (HOSPITAL_COMMUNITY)
Admit: 2018-09-25 | Discharge: 2018-09-25 | Disposition: A | Discharge status: Home / Self Care | Payer: Medicare Other | Attending: Hematology | Admitting: Hematology

## 2018-09-25 ENCOUNTER — Inpatient Hospital Stay (HOSPITAL_COMMUNITY): Admit: 2018-09-25 | Payer: Medicare Other

## 2018-09-25 ENCOUNTER — Encounter (HOSPITAL_COMMUNITY): Payer: Self-pay

## 2018-09-25 ENCOUNTER — Inpatient Hospital Stay (HOSPITAL_COMMUNITY)
Admission: RE | Admit: 2018-09-25 | Discharge: 2018-09-30 | DRG: 435 | Disposition: A | Discharge status: Hospice | Payer: Medicare Other | Attending: Internal Medicine | Admitting: Internal Medicine

## 2018-09-25 ENCOUNTER — Other Ambulatory Visit: Payer: Self-pay

## 2018-09-25 DIAGNOSIS — E46 Unspecified protein-calorie malnutrition: Secondary | ICD-10-CM | POA: Diagnosis present

## 2018-09-25 DIAGNOSIS — Z803 Family history of malignant neoplasm of breast: Secondary | ICD-10-CM

## 2018-09-25 DIAGNOSIS — Z79891 Long term (current) use of opiate analgesic: Secondary | ICD-10-CM

## 2018-09-25 DIAGNOSIS — E785 Hyperlipidemia, unspecified: Secondary | ICD-10-CM | POA: Diagnosis present

## 2018-09-25 DIAGNOSIS — C259 Malignant neoplasm of pancreas, unspecified: Secondary | ICD-10-CM

## 2018-09-25 DIAGNOSIS — Z808 Family history of malignant neoplasm of other organs or systems: Secondary | ICD-10-CM

## 2018-09-25 DIAGNOSIS — J189 Pneumonia, unspecified organism: Secondary | ICD-10-CM | POA: Diagnosis present

## 2018-09-25 DIAGNOSIS — R0600 Dyspnea, unspecified: Secondary | ICD-10-CM

## 2018-09-25 DIAGNOSIS — C801 Malignant (primary) neoplasm, unspecified: Secondary | ICD-10-CM | POA: Diagnosis present

## 2018-09-25 DIAGNOSIS — C250 Malignant neoplasm of head of pancreas: Secondary | ICD-10-CM | POA: Diagnosis not present

## 2018-09-25 DIAGNOSIS — Z7984 Long term (current) use of oral hypoglycemic drugs: Secondary | ICD-10-CM

## 2018-09-25 DIAGNOSIS — T451X5A Adverse effect of antineoplastic and immunosuppressive drugs, initial encounter: Secondary | ICD-10-CM | POA: Diagnosis present

## 2018-09-25 DIAGNOSIS — I959 Hypotension, unspecified: Secondary | ICD-10-CM | POA: Diagnosis not present

## 2018-09-25 DIAGNOSIS — I10 Essential (primary) hypertension: Secondary | ICD-10-CM | POA: Diagnosis present

## 2018-09-25 DIAGNOSIS — Z881 Allergy status to other antibiotic agents status: Secondary | ICD-10-CM

## 2018-09-25 DIAGNOSIS — R000 Tachycardia, unspecified: Secondary | ICD-10-CM | POA: Diagnosis not present

## 2018-09-25 DIAGNOSIS — Y95 Nosocomial condition: Secondary | ICD-10-CM | POA: Diagnosis present

## 2018-09-25 DIAGNOSIS — Z888 Allergy status to other drugs, medicaments and biological substances status: Secondary | ICD-10-CM

## 2018-09-25 DIAGNOSIS — R627 Adult failure to thrive: Secondary | ICD-10-CM

## 2018-09-25 DIAGNOSIS — D6481 Anemia due to antineoplastic chemotherapy: Secondary | ICD-10-CM | POA: Diagnosis present

## 2018-09-25 DIAGNOSIS — Z9079 Acquired absence of other genital organ(s): Secondary | ICD-10-CM

## 2018-09-25 DIAGNOSIS — M25531 Pain in right wrist: Secondary | ICD-10-CM | POA: Diagnosis present

## 2018-09-25 DIAGNOSIS — Z90722 Acquired absence of ovaries, bilateral: Secondary | ICD-10-CM

## 2018-09-25 DIAGNOSIS — C787 Secondary malignant neoplasm of liver and intrahepatic bile duct: Secondary | ICD-10-CM | POA: Diagnosis not present

## 2018-09-25 DIAGNOSIS — D630 Anemia in neoplastic disease: Secondary | ICD-10-CM | POA: Diagnosis present

## 2018-09-25 DIAGNOSIS — Z90710 Acquired absence of both cervix and uterus: Secondary | ICD-10-CM

## 2018-09-25 DIAGNOSIS — D696 Thrombocytopenia, unspecified: Secondary | ICD-10-CM

## 2018-09-25 DIAGNOSIS — Z66 Do not resuscitate: Secondary | ICD-10-CM | POA: Diagnosis not present

## 2018-09-25 DIAGNOSIS — E039 Hypothyroidism, unspecified: Secondary | ICD-10-CM | POA: Diagnosis present

## 2018-09-25 DIAGNOSIS — Z6825 Body mass index (BMI) 25.0-25.9, adult: Secondary | ICD-10-CM

## 2018-09-25 DIAGNOSIS — Z79899 Other long term (current) drug therapy: Secondary | ICD-10-CM

## 2018-09-25 DIAGNOSIS — E119 Type 2 diabetes mellitus without complications: Secondary | ICD-10-CM | POA: Diagnosis present

## 2018-09-25 DIAGNOSIS — D6959 Other secondary thrombocytopenia: Secondary | ICD-10-CM | POA: Diagnosis present

## 2018-09-25 DIAGNOSIS — K831 Obstruction of bile duct: Secondary | ICD-10-CM | POA: Diagnosis present

## 2018-09-25 DIAGNOSIS — Z87891 Personal history of nicotine dependence: Secondary | ICD-10-CM

## 2018-09-25 DIAGNOSIS — Z8542 Personal history of malignant neoplasm of other parts of uterus: Secondary | ICD-10-CM

## 2018-09-25 DIAGNOSIS — Z515 Encounter for palliative care: Secondary | ICD-10-CM | POA: Diagnosis not present

## 2018-09-25 DIAGNOSIS — Z79890 Hormone replacement therapy (postmenopausal): Secondary | ICD-10-CM

## 2018-09-25 HISTORY — PX: IR BILIARY STENT(S) EXISTING ACCESS INC DILATION CATH EXCHANGE: IMG6048

## 2018-09-25 LAB — CBC WITH DIFFERENTIAL/PLATELET
Band Neutrophils: 7 %
Basophils Absolute: 0 10*3/uL (ref 0.0–0.1)
Basophils Relative: 0 %
Blasts: 0 %
Eosinophils Absolute: 0 10*3/uL (ref 0.0–0.5)
Eosinophils Relative: 0 %
HCT: 26.1 % — ABNORMAL LOW (ref 36.0–46.0)
Hemoglobin: 8.4 g/dL — ABNORMAL LOW (ref 12.0–15.0)
Lymphocytes Relative: 4 %
Lymphs Abs: 0.3 10*3/uL — ABNORMAL LOW (ref 0.7–4.0)
MCH: 31.6 pg (ref 26.0–34.0)
MCHC: 32.2 g/dL (ref 30.0–36.0)
MCV: 98.1 fL (ref 80.0–100.0)
Metamyelocytes Relative: 0 %
Monocytes Absolute: 0.2 10*3/uL (ref 0.1–1.0)
Monocytes Relative: 2 %
Myelocytes: 1 %
Neutro Abs: 7.3 10*3/uL (ref 1.7–7.7)
Neutrophils Relative %: 86 %
Other: 0 %
Platelets: 30 10*3/uL — ABNORMAL LOW (ref 150–400)
Promyelocytes Relative: 0 %
RBC: 2.66 MIL/uL — ABNORMAL LOW (ref 3.87–5.11)
RDW: 18.6 % — ABNORMAL HIGH (ref 11.5–15.5)
WBC: 7.8 10*3/uL (ref 4.0–10.5)
nRBC: 0 % (ref 0.0–0.2)
nRBC: 0 /100 WBC

## 2018-09-25 LAB — COMPREHENSIVE METABOLIC PANEL
ALT: 56 U/L — ABNORMAL HIGH (ref 0–44)
AST: 81 U/L — ABNORMAL HIGH (ref 15–41)
Albumin: 2 g/dL — ABNORMAL LOW (ref 3.5–5.0)
Alkaline Phosphatase: 356 U/L — ABNORMAL HIGH (ref 38–126)
Anion gap: 12 (ref 5–15)
BUN: 40 mg/dL — ABNORMAL HIGH (ref 8–23)
CO2: 22 mmol/L (ref 22–32)
Calcium: 9.1 mg/dL (ref 8.9–10.3)
Chloride: 101 mmol/L (ref 98–111)
Creatinine, Ser: 1.17 mg/dL — ABNORMAL HIGH (ref 0.44–1.00)
GFR calc Af Amer: 52 mL/min — ABNORMAL LOW (ref >60)
GFR calc non Af Amer: 45 mL/min — ABNORMAL LOW (ref >60)
Glucose, Bld: 264 mg/dL — ABNORMAL HIGH (ref 70–99)
Potassium: 4.4 mmol/L (ref 3.5–5.1)
Sodium: 135 mmol/L (ref 135–145)
Total Bilirubin: 3.9 mg/dL — ABNORMAL HIGH (ref 0.3–1.2)
Total Protein: 6 g/dL — ABNORMAL LOW (ref 6.5–8.1)

## 2018-09-25 LAB — PROTIME-INR
INR: 1.3 — ABNORMAL HIGH (ref 0.8–1.2)
Prothrombin Time: 16 seconds — ABNORMAL HIGH (ref 11.4–15.2)

## 2018-09-25 LAB — GLUCOSE, CAPILLARY: Glucose-Capillary: 175 mg/dL — ABNORMAL HIGH (ref 70–99)

## 2018-09-25 MED ORDER — MIDAZOLAM HCL 2 MG/2ML IJ SOLN
INTRAMUSCULAR | Status: AC | PRN
  Administered 2018-09-25 (×2): 0.5 mg via INTRAVENOUS

## 2018-09-25 MED ORDER — IOHEXOL 300 MG/ML  SOLN
50.0000 mL | Freq: Once | INTRAMUSCULAR | Status: AC | PRN
  Administered 2018-09-25: 25 mL

## 2018-09-25 MED ORDER — TRAMADOL HCL 50 MG PO TABS: 50.0000 mg | ORAL_TABLET | Freq: Four times a day (QID) | ORAL | Status: DC | PRN

## 2018-09-25 MED ORDER — ATORVASTATIN CALCIUM 10 MG PO TABS
10.0000 mg | ORAL_TABLET | Freq: Every day | ORAL | Status: DC
  Administered 2018-09-26 – 2018-09-27 (×2): 10 mg via ORAL
  Filled 2018-09-25 (×2): qty 1

## 2018-09-25 MED ORDER — SODIUM CHLORIDE 0.9 % IV SOLN
2.0000 g | Freq: Once | INTRAVENOUS | Status: AC
  Administered 2018-09-25: 17:00:00 2 g via INTRAVENOUS
  Filled 2018-09-25: qty 2

## 2018-09-25 MED ORDER — INSULIN ASPART 100 UNIT/ML ~~LOC~~ SOLN
SUBCUTANEOUS | Status: AC
  Filled 2018-09-25: qty 1

## 2018-09-25 MED ORDER — ACETAMINOPHEN 325 MG PO TABS: 650.0000 mg | ORAL_TABLET | Freq: Four times a day (QID) | ORAL | Status: DC | PRN

## 2018-09-25 MED ORDER — DIPHENOXYLATE-ATROPINE 2.5-0.025 MG PO TABS: 1.0000 | ORAL_TABLET | Freq: Four times a day (QID) | ORAL | Status: DC | PRN

## 2018-09-25 MED ORDER — POTASSIUM CHLORIDE CRYS ER 20 MEQ PO TBCR
20.0000 meq | EXTENDED_RELEASE_TABLET | Freq: Every day | ORAL | Status: DC
  Administered 2018-09-26 – 2018-09-30 (×4): 20 meq via ORAL
  Filled 2018-09-25 (×4): qty 1

## 2018-09-25 MED ORDER — SODIUM CHLORIDE 0.9 % IV SOLN
1.0000 g | Freq: Three times a day (TID) | INTRAVENOUS | Status: DC
  Administered 2018-09-26 (×2): 1 g via INTRAVENOUS
  Filled 2018-09-25 (×4): qty 1

## 2018-09-25 MED ORDER — VITAMIN D 25 MCG (1000 UNIT) PO TABS: 2000.0000 [IU] | ORAL_TABLET | ORAL | Status: DC

## 2018-09-25 MED ORDER — MAGIC MOUTHWASH W/LIDOCAINE
5.0000 mL | Freq: Three times a day (TID) | ORAL | Status: DC | PRN
  Filled 2018-09-25: qty 5

## 2018-09-25 MED ORDER — ALPRAZOLAM 0.25 MG PO TABS: 0.2500 mg | ORAL_TABLET | Freq: Every evening | ORAL | Status: DC | PRN

## 2018-09-25 MED ORDER — HEPARIN SOD (PORK) LOCK FLUSH 100 UNIT/ML IV SOLN: 500.0000 [IU] | Freq: Once | INTRAVENOUS | Status: DC

## 2018-09-25 MED ORDER — HYDROCODONE-ACETAMINOPHEN 5-325 MG PO TABS: 1.0000 | ORAL_TABLET | Freq: Four times a day (QID) | ORAL | Status: DC | PRN

## 2018-09-25 MED ORDER — FENTANYL CITRATE (PF) 100 MCG/2ML IJ SOLN
INTRAMUSCULAR | Status: AC
  Filled 2018-09-25: qty 2

## 2018-09-25 MED ORDER — MIDAZOLAM HCL 2 MG/2ML IJ SOLN
INTRAMUSCULAR | Status: AC
  Filled 2018-09-25: qty 2

## 2018-09-25 MED ORDER — SODIUM CHLORIDE 0.9 % IV BOLUS
500.0000 mL | Freq: Once | INTRAVENOUS | Status: AC
  Administered 2018-09-25: 500 mL via INTRAVENOUS

## 2018-09-25 MED ORDER — SODIUM CHLORIDE 0.9 % IV SOLN
1.0000 g | Freq: Two times a day (BID) | INTRAVENOUS | Status: DC
  Filled 2018-09-25: qty 1

## 2018-09-25 MED ORDER — METRONIDAZOLE IN NACL 5-0.79 MG/ML-% IV SOLN
500.0000 mg | Freq: Two times a day (BID) | INTRAVENOUS | Status: DC
  Administered 2018-09-25 – 2018-09-30 (×10): 500 mg via INTRAVENOUS
  Filled 2018-09-25 (×10): qty 100

## 2018-09-25 MED ORDER — METRONIDAZOLE IN NACL 5-0.79 MG/ML-% IV SOLN
500.0000 mg | Freq: Once | INTRAVENOUS | Status: AC
  Administered 2018-09-25: 500 mg via INTRAVENOUS
  Filled 2018-09-25: qty 100

## 2018-09-25 MED ORDER — METFORMIN HCL 500 MG PO TABS: 500.0000 mg | ORAL_TABLET | Freq: Every day | ORAL | Status: DC

## 2018-09-25 MED ORDER — HYDROCHLOROTHIAZIDE 25 MG PO TABS: 25.0000 mg | ORAL_TABLET | Freq: Every day | ORAL | Status: DC | PRN

## 2018-09-25 MED ORDER — TRAZODONE 25 MG HALF TABLET
25.0000 mg | ORAL_TABLET | Freq: Every evening | ORAL | Status: DC | PRN
  Filled 2018-09-25: qty 1

## 2018-09-25 MED ORDER — SODIUM CHLORIDE 0.9 % IV SOLN
INTRAVENOUS | Status: DC
  Administered 2018-09-26: via INTRAVENOUS

## 2018-09-25 MED ORDER — SODIUM CHLORIDE 0.9 % IV SOLN
INTRAVENOUS | Status: DC
  Administered 2018-09-25 – 2018-09-30 (×4): via INTRAVENOUS

## 2018-09-25 MED ORDER — PANTOPRAZOLE SODIUM 40 MG PO TBEC
40.0000 mg | DELAYED_RELEASE_TABLET | Freq: Every day | ORAL | Status: DC
  Administered 2018-09-26 – 2018-09-30 (×5): 40 mg via ORAL
  Filled 2018-09-25 (×5): qty 1

## 2018-09-25 MED ORDER — INSULIN ASPART 100 UNIT/ML ~~LOC~~ SOLN
0.0000 [IU] | SUBCUTANEOUS | Status: DC
  Administered 2018-09-25: 20:00:00 2 [IU] via SUBCUTANEOUS
  Administered 2018-09-25: 5 [IU] via SUBCUTANEOUS
  Administered 2018-09-26: 09:00:00 1 [IU] via SUBCUTANEOUS
  Administered 2018-09-26 (×5): 2 [IU] via SUBCUTANEOUS
  Administered 2018-09-27: 1 [IU] via SUBCUTANEOUS
  Administered 2018-09-27: 21:00:00 2 [IU] via SUBCUTANEOUS
  Administered 2018-09-27 (×2): 1 [IU] via SUBCUTANEOUS
  Administered 2018-09-27 – 2018-09-28 (×2): 2 [IU] via SUBCUTANEOUS
  Administered 2018-09-28: 05:00:00 1 [IU] via SUBCUTANEOUS
  Filled 2018-09-25: qty 0.09

## 2018-09-25 MED ORDER — FENTANYL CITRATE (PF) 100 MCG/2ML IJ SOLN
INTRAMUSCULAR | Status: AC | PRN
  Administered 2018-09-25 (×2): 25 ug via INTRAVENOUS

## 2018-09-25 MED ORDER — NETARSUDIL-LATANOPROST 0.02-0.005 % OP SOLN
1.0000 [drp] | Freq: Every day | OPHTHALMIC | Status: DC
  Administered 2018-09-26 – 2018-09-29 (×4): 1 [drp] via OPHTHALMIC

## 2018-09-25 MED ORDER — HYOSCYAMINE SULFATE ER 0.375 MG PO TB12
0.3750 mg | ORAL_TABLET | Freq: Two times a day (BID) | ORAL | Status: DC | PRN
  Filled 2018-09-25: qty 1

## 2018-09-25 MED ORDER — HYDROCODONE-ACETAMINOPHEN 5-325 MG PO TABS
1.0000 | ORAL_TABLET | ORAL | Status: DC | PRN
  Administered 2018-09-25: 1 via ORAL
  Filled 2018-09-25: qty 1

## 2018-09-25 MED ORDER — PROCHLORPERAZINE MALEATE 10 MG PO TABS: 10.0000 mg | ORAL_TABLET | Freq: Four times a day (QID) | ORAL | Status: DC | PRN

## 2018-09-25 MED ORDER — LIDOCAINE HCL 1 % IJ SOLN
INTRAMUSCULAR | Status: AC
  Filled 2018-09-25: qty 20

## 2018-09-25 MED ORDER — LEVOTHYROXINE SODIUM 25 MCG PO TABS
25.0000 ug | ORAL_TABLET | Freq: Every day | ORAL | Status: DC
  Administered 2018-09-26 – 2018-09-30 (×5): 25 ug via ORAL
  Filled 2018-09-25 (×5): qty 1

## 2018-09-25 MED ORDER — ONDANSETRON HCL 4 MG PO TABS: 8.0000 mg | ORAL_TABLET | Freq: Three times a day (TID) | ORAL | Status: DC | PRN

## 2018-09-25 MED ORDER — AZTREONAM 1 G IJ SOLR: 1.0000 g | Freq: Two times a day (BID) | INTRAMUSCULAR | Status: DC

## 2018-09-25 MED ORDER — MEGESTROL ACETATE 400 MG/10ML PO SUSP
800.0000 mg | Freq: Every day | ORAL | Status: DC
  Administered 2018-09-26 – 2018-09-27 (×2): 800 mg via ORAL
  Filled 2018-09-25 (×4): qty 20

## 2018-09-25 NOTE — H&P (Signed)
Chief Complaint: Patient was seen in consultation today for pancreatic cancer  Referring Physician(s): [place]  Supervising Physician: Jacqulynn Cadet  Patient Status: [facility] - Out-pt  History of Present Illness: Carly [name] is a 78 y.o. female with past medical history of pancreatic cancer diagnosed in 2019 with recent marked progression and enlargement by CT scan who recently underwent biliary drain placement in IR 09/18/18. Patient was discharged home the following day.  Drain has been capped since discharge.  She presents to IR today for biliary stent placement.   Patient assessed today and appear weak.  States "I'm just so tired all the time."  She is afebrile.  Her BP is 90/49.  Otherwise, she denies complaint including fever, chills, headaches, chest pain, shortness of breath, dysuria.  She has been NPO.  She does not take blood thinners.   Past Medical History:  Diagnosis Date   Allergic rhinitis    Skin Test 04-14-2009   Asthma    Cancer [facility]) 1981   adenocarcinoma of uterus   Cancer ([place]) 2019   PANCREATIC    Diabetes mellitus without complication ([place])    type II, no meds per pt   Family history of breast cancer    History of uterine cancer    Hyperlipemia    Hypertension    Insomnia     Past Surgical History:  Procedure Laterality Date   ABDOMINAL HYSTERECTOMY     CARPAL TUNNEL RELEASE     ESOPHAGOGASTRODUODENOSCOPY (EGD) WITH PROPOFOL N/A 09/11/2018   Procedure: ESOPHAGOGASTRODUODENOSCOPY (EGD) WITH PROPOFOL;  Surgeon: Arta Silence, MD;  Location: [facility];  Service: Endoscopy;  Laterality: N/A;   EUS N/A 06/13/2017   Procedure: FULL UPPER ENDOSCOPIC ULTRASOUND (EUS) RADIAL;  Surgeon: Arta Silence, MD;  Location: [facility];  Service: Endoscopy;  Laterality: N/A;   I&D EXTREMITY  09/17/2011   Procedure: IRRIGATION AND DEBRIDEMENT EXTREMITY;  Surgeon: Roseanne Kaufman, MD;  Location: [facility];  Service: Orthopedics;   Laterality: Left;  with drain placement   IR FLUORO GUIDE PORT INSERTION RIGHT  07/18/2017   IR INT EXT BILIARY DRAIN WITH CHOLANGIOGRAM  09/18/2018   IR US GUIDE VASC ACCESS RIGHT  07/18/2017   NASAL SEPTOPLASTY W/ TURBINOPLASTY     pelvic floor rebuild     TONSILLECTOMY     TOTAL ABDOMINAL HYSTERECTOMY W/ BILATERAL SALPINGOOPHORECTOMY      Allergies: Cephalexin and Nabumetone  Medications: Prior to Admission medications   Medication Sig Start Date End Date Taking? Authorizing Provider  atorvastatin (LIPITOR) 10 MG tablet Take 10 mg by mouth at bedtime.    Yes [provider]  CALCIUM PO Take 1 tablet by mouth once a week.    Yes [provider]  Cholecalciferol (VITAMIN D) 2000 units CAPS Take 1 capsule by mouth once a week.    Yes [provider]  cyanocobalamin 2000 MCG tablet Take 2,000 mcg by mouth every evening.   Yes [provider]  diphenoxylate-atropine (LOMOTIL) 2.5-0.025 MG tablet Take 1-2 tablets by mouth 4 (four) times daily as needed for diarrhea or loose stools. 02/04/18  Yes Truitt Merle, MD  hydrochlorothiazide (HYDRODIURIL) 25 MG tablet Take 25 mg by mouth daily as needed (swelling (typically whenever traveling to beach)).  11/29/11  Yes [provider]  HYDROcodone-acetaminophen (NORCO) 5-325 MG tablet Take 1 tablet by mouth every 6 (six) hours as needed for moderate pain. 08/29/17  Yes Truitt Merle, MD  hyoscyamine (LEVBID) 0.375 MG 12 hr tablet Take 0.375 mg by  mouth every 12 (twelve) hours as needed for cramping.    Yes [provider]  levothyroxine (SYNTHROID, LEVOTHROID) 25 MCG tablet Take 25 mcg by mouth daily before breakfast.    Yes [provider]  lidocaine-prilocaine (EMLA) cream Apply 1 application topically as needed. Patient taking differently: Apply 1 application topically as needed (prior to port-a-cath being accessed).  09/02/18  Yes Alla Feeling, NP  loratadine (CLARITIN) 10 MG tablet Take 10  mg by mouth daily.   Yes [provider]  magic mouthwash w/lidocaine SOLN Take 5 mLs by mouth 3 (three) times daily as needed for mouth pain. 09/23/18  Yes Truitt Merle, MD  megestrol (MEGACE ES) 625 MG/5ML suspension Take 5 mLs (625 mg total) by mouth daily. 09/23/18  Yes Truitt Merle, MD  metFORMIN (GLUCOPHAGE) 500 MG tablet TAKE 1 TABLET BY MOUTH EVERY DAY WITH BREAKFAST Patient taking differently: Take 500 mg by mouth daily with breakfast.  09/10/18  Yes Truitt Merle, MD  omeprazole (PRILOSEC) 20 MG capsule Take 20 mg by mouth daily before breakfast.  11/29/11  Yes [provider]  ondansetron (ZOFRAN) 8 MG tablet Take 1 tablet (8 mg total) by mouth every 8 (eight) hours as needed for nausea or vomiting. 09/02/18  Yes Burton, Wilhemina Cash, NP  ONETOUCH DELICA LANCETS 01E MISC USE TO TEST YOUR BLOOD SUGAR ONCE A DAY FASTING AND 2 HRS AFTER A MEAL AS NEEDED 04/26/17  Yes [provider]  ONETOUCH VERIO test strip USE TO TEST YOUR BLOOD SUGAR ONCE A DAY FASTING & 2 HRS AFTER A MEAL AS NEEDED 04/26/17  Yes [provider]  potassium chloride SA (KLOR-CON M20) 20 MEQ tablet Take 1 tablet (20 mEq total) by mouth 2 (two) times daily. Patient taking differently: Take 20 mEq by mouth daily.  05/06/18  Yes Truitt Merle, MD  prochlorperazine (COMPAZINE) 10 MG tablet Take 1 tablet (10 mg total) by mouth every 6 (six) hours as needed for nausea or vomiting. 09/02/18  Yes Alla Feeling, NP  ROCKLATAN 0.02-0.005 % SOLN Place 1 drop into both eyes at bedtime.  11/21/17  Yes [provider]  traMADol (ULTRAM) 50 MG tablet Take 50 mg by mouth every 6 (six) hours as needed (pain).   Yes [provider]  traZODone (DESYREL) 100 MG tablet TAKE 1 TABLET BY MOUTH EVERYDAY AT BEDTIME Patient taking differently: Take 50 mg by mouth at bedtime.  06/19/18  Yes Princess Bruins, MD  ALPRAZolam Duanne Moron) 0.5 MG tablet Take 1 tablet (0.5 mg total) by mouth at bedtime as needed for anxiety. Patient  taking differently: Take 0.25 mg by mouth at bedtime as needed for anxiety.  06/17/18   Truitt Merle, MD  eltrombopag (PROMACTA) 50 MG tablet TAKE 2 TABLETS (100 MG) BY MOUTH EVERY DAY ON AN EMPTY STOMACH, 1 HOUR BEFORE OR 2 HOURS AFTER A MEAL Patient not taking: Reported on 09/23/2018 08/26/18   Truitt Merle, MD     Family History  Problem Relation Age of Onset   Stroke Mother 71   Heart attack Father 76   Dementia Brother    Hypertension Brother    Skin cancer Brother    Breast cancer Maternal Grandmother        dx >50   Breast cancer Cousin        mat first cousin, dx in her 51s   Cancer Cousin        oral cancer   Breast cancer Cousin  mat first cousin, dx in her 31s   Heart disease Maternal Uncle    Other Maternal Grandfather        suicide   Heart disease Paternal Grandmother     Social History   Socioeconomic History   Marital status: Widowed    Spouse name: Not on file   Number of children: Not on file   Years of education: Not on file   Highest education level: Not on file  Occupational History   Not on file  Social Needs   Financial resource strain: Not on file   Food insecurity    Worry: Not on file    Inability: Not on file   Transportation needs    Medical: Not on file    Non-medical: Not on file  Tobacco Use   Smoking status: Former Smoker    Packs/day: 0.50    Years: 15.00    Pack years: 7.50    Types: Cigarettes    Quit date: 02/27/1978    Years since quitting: 40.6   Smokeless tobacco: Never Used  Substance and Sexual Activity   Alcohol use: Yes    Alcohol/week: 0.0 standard drinks    Comment: occ- glass of wine   Drug use: No   Sexual activity: Yes    Comment: 1st intercouse- 15, partners- 1  Lifestyle   Physical activity    Days per week: Not on file    Minutes per session: Not on file   Stress: Not on file  Relationships   Social connections    Talks on phone: Not on file    Gets together: Not on file     Attends religious service: Not on file    Active member of club or organization: Not on file    Attends meetings of clubs or organizations: Not on file    Relationship status: Not on file  Other Topics Concern   Not on file  Social History Narrative   Not on file     Review of Systems: A 12 point ROS discussed and pertinent positives are indicated in the HPI above.  All other systems are negative.  Review of Systems  Constitutional: Positive for activity change, appetite change and fatigue. Negative for fever.  Respiratory: Negative for cough and shortness of breath.   Cardiovascular: Negative for chest pain.  Gastrointestinal: Positive for nausea. Negative for abdominal pain.  Genitourinary: Negative for dysuria.  Musculoskeletal: Negative for back pain.  Psychiatric/Behavioral: Negative for behavioral problems and confusion.    Vital Signs: BP 116/71 (BP Location: Left Arm)    Pulse (!) 105    Resp (!) 26    SpO2 93%   Physical Exam Vitals signs and nursing note reviewed.  Constitutional:      Appearance: Normal appearance.  HENT:     Mouth/Throat:     Mouth: Mucous membranes are moist.     Pharynx: Oropharynx is clear.  Cardiovascular:     Rate and Rhythm: Normal rate and regular rhythm.  Pulmonary:     Effort: Pulmonary effort is normal. No respiratory distress.     Breath sounds: Normal breath sounds.  Abdominal:     Palpations: Abdomen is soft.     Comments: Biliary drain in place.  Capped.  Insertion site intact.  Small amount of drainage around tubing.   Neurological:     General: No focal deficit present.     Mental Status: She is alert and oriented to person, place, and time.  Psychiatric:  Mood and Affect: Mood normal.        Behavior: Behavior normal.        Thought Content: Thought content normal.        Judgment: Judgment normal.      MD Evaluation Airway: WNL Heart: WNL Abdomen: WNL Chest/ Lungs: WNL ASA  Classification:  3 Mallampati/Airway Score: Two   Imaging: US Abdomen Complete  Result Date: 09/05/2018 CLINICAL DATA:  79 year old with hyperbilirubinemia. 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 EXAM: ABDOMEN ULTRASOUND COMPLETE COMPARISON:  CT abdomen and pelvis 08/14/2018 and earlier. FINDINGS: Gallbladder: Layering echogenic sludge. No shadowing gallstones. No gallbladder wall thickening or pericholecystic fluid. Negative sonographic Murphy's sign according to the ultrasound technologist. Common bile duct: Diameter: Approximately 10 mm. The bile duct is obstructed by the mass involving the pancreatic head described below. Liver: Moderate intrahepatic biliary ductal dilation, new since the CT 3 weeks ago. Multiple hepatic parenchymal masses, better demonstrated on the prior CT. Geographic areas of hepatic steatosis. Portal vein is patent on color Doppler imaging with normal direction of blood flow towards the liver. IVC: Patent. Pancreas: Large mixed cystic and solid mass involving the head of the pancreas as noted on the prior CT, measuring at least 6.4 x 6.4 x 4.6 cm, more accurately sized on CT. Spleen: Borderline to mild splenomegaly with a volume of approximately 421 mL. No focal splenic parenchymal abnormality. Right Kidney: Length: Approximate 11.7 cm. No hydronephrosis. Well-preserved cortex. No shadowing calculi. Normal parenchymal echotexture. No focal parenchymal abnormality. Left Kidney: Length: Approximately 10.8 cm. 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 Well-preserved cortex. No shadowing calculi. Normal parenchymal echotexture. No focal parenchymal abnormality. Abdominal aorta: Normal in caliber throughout its visualized course in the abdomen with evidence of atherosclerosis. Maximum diameter 1.4 cm. Other findings: None. IMPRESSION: Since the CT abdomen and pelvis 3 weeks ago: 1. Interval development of moderate intrahepatic biliary ductal  dilation. The common bile duct is dilated up to approximately 10 mm in diameter. 2. Mixed cystic and solid mass involving the head of the pancreas measuring just over 6 cm on ultrasound. Sizing is more accurate on CT. 3. Multiple hepatic metastases as noted on CT. 4. Stable borderline to mild splenomegaly. 5. 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 Electronically Signed   By: Evangeline Dakin M.D.   On: 09/05/2018 14:21   Ct Abdomen Pelvis W Contrast  Result Date: 09/18/2018 CLINICAL DATA:  Pancreatic cancer. EXAM: CT ABDOMEN AND PELVIS WITH CONTRAST TECHNIQUE: Multidetector CT imaging of the abdomen and pelvis was performed using the standard protocol following bolus administration of intravenous contrast. CONTRAST:  151m OMNIPAQUE IOHEXOL 300 MG/ML  SOLN COMPARISON:  04/02/2018 FINDINGS: Lower chest: Basilar atelectasis bilaterally. Hepatobiliary: 4.1 cm metastatic lesion in the dome of the liver has increased from 2.8 cm previously. 3.4 cm lesion posterior right liver (26/3) has increased from 2.3 cm previously. 9 mm metastatic lesion in the inferior right liver adjacent to the gallbladder fossa (27/3) is stable. 11 mm lesion posterior right liver on 17/3 was 9 mm previously. Interval development of moderate intrahepatic biliary duct dilatation. Extrahepatic common duct measures up to 17 mm diameter with abrupt termination in the head of pancreas. Gallbladder unremarkable. Pancreas: Marked interval progression of the heterogeneous low-density pancreatic head mass, now measuring 8.1 x 6.6 cm (35/3) compared to 5.2 x 4.3 cm previously. As noted above, this obstructs the common bile duct in the  head of the pancreas. Mass lesion generates substantial mass-effect on the gastric antrum and duodenum. Pancreatic body and talar atrophic with some dilatation of the main duct. Spleen: Mild splenomegaly. Adrenals/Urinary Tract: No adrenal nodule or mass. No  enhancing mass lesion noted in either kidney. No substantial hydroureteronephrosis. The urinary bladder appears normal for the degree of distention. Stomach/Bowel: Decompressed. Marked mass-effect on the distal stomach due to the enlarging pancreatic head mass. Duodenum is nondilated. No small bowel wall thickening. No small bowel dilatation. The terminal ileum is normal. The appendix is normal. No gross colonic mass. No colonic wall thickening. Diverticular changes are noted in the left colon without evidence of diverticulitis. Vascular/Lymphatic: There is abdominal aortic atherosclerosis without aneurysm. 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 reconstitutes with extensive collateralization in the left abdomen and markedly prominent left renal vein. No para-aortic lymphadenopathy. No pelvic sidewall lymphadenopathy. Reproductive: The uterus is surgically absent. There is no adnexal mass. Other: Interval development of multiple soft tissue nodules in the perigastric fat around the antrum with apparent extension into the omentum. 13 mm perigastric nodule visible on 43/5. Omental nodularity visible on 38/5. No intraperitoneal free fluid. Musculoskeletal: No worrisome lytic or sclerotic osseous abnormality. IMPRESSION: 1. Marked interval progression of the pancreatic head mass in the 4 week interval since prior study. Lesion measures up to 8.1 cm diameter today compared to 5.2 cm previously. Lesion generates substantial mass-effect on the posterior wall gastric antrum. 2. New metastatic nodularity in the perigastric fat, left omentum, and transverse mesocolon. 3. Interval development of moderate to severe intra and extrahepatic biliary duct dilatation with obliteration of the common bile duct in the head of the pancreas by the tumor. Correlation with liver function test recommended. 4. Interval progression of known liver metastases. 5. 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 6.  Aortic Atherosclerois (ICD10-170.0) These results will be called to the ordering clinician or representative by the Radiologist Assistant, and communication documented in the PACS or zVision Dashboard. Electronically Signed   By: Misty Stanley M.D.   On: 09/18/2018 10:12   Dg C-arm 1-60 Min  Result Date: 09/11/2018 CLINICAL DATA:  Attempted ERCP. 6 seconds of fluoroscopy. Unable to cannulate wire past the ambulance. EXAM: DG C-ARM 61-120 MIN COMPARISON:  Ultrasound, 09/05/2018. FINDINGS: Single submitted image shows the endoscopy scope projecting in the duodenum. IMPRESSION: Single portable fluoroscopic image from an attempted ERCP. Electronically Signed   By: Lajean Manes M.D.   On: 09/11/2018 14:30   Ir Int Lianne Cure Biliary Drain With Cholangiogram  Result Date: 09/18/2018 INDICATION: 78 year old female with pancreatic cancer and malignant obstructed jaundice. She presents for percutaneous transhepatic cholangiogram and biliary drain placement. EXAM: PTC with internal/external biliary drain placement MEDICATIONS: 20 mg aztreonam, 500 mg Flagyl; The antibiotic was administered within an appropriate time frame prior to the initiation of the procedure. ANESTHESIA/SEDATION: Moderate (conscious) sedation was employed during this procedure. A total of Versed 4 mg and Fentanyl 100 mcg was administered intravenously. Moderate Sedation Time: 37 minutes. The patient's level of consciousness and vital signs were monitored continuously by radiology nursing throughout the procedure under my direct supervision. FLUOROSCOPY TIME:  Fluoroscopy Time: 12 minutes 54 seconds (238 mGy). COMPLICATIONS: None immediate. PROCEDURE: Informed written consent was obtained from the patient after a thorough discussion of the procedural risks, benefits and alternatives. All questions were addressed. Maximal Sterile Barrier  Technique was utilized including caps, mask,  sterile gowns, sterile gloves, sterile drape, hand hygiene and skin antiseptic. A timeout was performed prior to the initiation of the procedure. The right upper quadrant was interrogated with ultrasound. There is not a suitable window for ultrasound-guided percutaneous transhepatic cholangiogram. Therefore, an intercostal space in the mid aspect of the hepatic shadow in the mid axillary line was identified. Local anesthesia was attained by infiltration with 1% lidocaine. Under fluoroscopic guidance, a 21 gauge Chiba needle was advanced into the liver. The needle was pulled back in till the tip was within a biliary radicle. A percutaneous transhepatic cholangiogram was then performed. There is significant dilation of the common hepatic and common bile ducts. No contrast material passes into the duodenum consistent with a complete malignant obstruction. The central intra and extrahepatic biliary ducts are widely dilated. The peripheral radicles are moderately dilated. A suitable peripheral radical of the right posterior biliary tree was localized. Using a second 21 gauge Chiba needle, the needle was used to puncture the bile duct under fluoroscopic guidance. A 0.018 wire was then advanced into the biliary radicle and further on into the common bile duct. The needle was removed. An Accustick sheath was then used to dilate the transhepatic tract. A road runner wire was then introduced through the Accustick sheath. The wire was successfully navigated through the obstruction and into the duodenum. The Accustick sheath was removed. The transhepatic tract was then dilated to 10 [place] using a soft tissue dilator. A Cook 26 [place] biliary drainage catheter was then advanced over the wire and formed with the locking loop in the duodenum and the proximal side hole in the common hepatic duct. There is excellent drainage of the biliary tree. A final contrast injection confirms  catheter location. The catheter was secured to the skin with 0 Prolene suture. The catheter was connected to bag drainage. IMPRESSION: 1. Successful percutaneous transhepatic cholangiogram demonstrating complete malignant obstruction of the distal common bile duct and moderate biliary ductal dilatation. 2. Successful placement of a 10 French internal/external percutaneous biliary drain. The drain was left to gravity bag. PLAN: 1. Admit overnight for observation. 2. Maintained to bag drainage overnight. 3. If doing well in the morning, drain can be capped. 4. Return to interventional Radiology in approximately 1 week for stent placement. Signed, Criselda Peaches, MD, [facility] Electronically Signed   By: Jacqulynn Cadet M.D.   On: 09/18/2018 15:10    Labs:  CBC: Recent Labs    09/18/18 1118 09/19/18 0948 09/23/18 0900 09/25/18 1350  WBC 4.3 2.9* 8.0 7.8  HGB 9.5* 8.6* 9.7* 8.4*  HCT 28.4* 27.3* 29.4* 26.1*  PLT 158 111* 73* 30*    COAGS: Recent Labs    09/18/18 1118 09/25/18 1350  INR 1.4* 1.3*    BMP: Recent Labs    09/18/18 1118 09/19/18 0948 09/23/18 0900 09/25/18 1350  NA 134* 132* 136 135  K 3.8 3.7 3.9 4.4  CL 98 102 101 101  CO2 _0 GLUCOSE 162* 186* 199* 264*  BUN _1 40*  CALCIUM 9.3 9.0 10.1 9.1  CREATININE 0.84 0.91 0.73 1.17*  GFRNONAA >60 >60 >60 45*  GFRAA >60 >60 >60 52*    LIVER FUNCTION TESTS: Recent Labs    09/18/18 1118 09/19/18 0948 09/23/18 0900 09/25/18 1350  BILITOT 12.4* 5.4* 5.2* 3.9*  AST 160* 127* 121* 81*  ALT 92* 81* 80* 56*  ALKPHOS 723* 605* 691* 356*  PROT 6.8  6.2* 6.3* 6.0*  ALBUMIN 2.2* 2.0* 2.0* 2.0*    TUMOR MARKERS: No results for input(s): AFPTM, CEA, CA199, CHROMGRNA in the last 8760 hours.  Assessment and Plan: Patient with past medical history of pancreatic cancer s/p biliary drain placement presents for internalization/stenting.  She has tolerated drain capping at home over the past 1 week. Case reviewed by Dr. Laurence Ferrari who approves patient for procedure.  Patient presents today in their usual state of health.  She has been NPO and is not currently on blood thinners.  She does have soft blood pressures today.  Her SBP is typically 130-150, now in the 90s.  Bolused 500 mL NS.  Her platelets are 30.  Her Tbili improved slightly to 3.9 from 5.2. Dr. Laurence Ferrari to discuss with Dr. Burr Medico.  Risks and benefits discussed with the patient including, but not limited to bleeding, infection which may lead to sepsis or even death and damage to adjacent structures.  This interventional procedure involves the use of X-rays and because of the nature of the planned procedure, it is possible that we will have prolonged use of X-ray fluoroscopy.  Potential radiation risks to you include (but are not limited to) the following: - A slightly elevated risk for cancer  several years later in life. This risk is typically less than 0.5% percent. This risk is low in comparison to the normal incidence of human cancer, which is 33% for women and 50% for men according to the [place]. - Radiation induced injury can include skin redness, resembling a rash, tissue breakdown / ulcers and hair loss (which can be temporary or permanent).   The likelihood of either of these occurring depends on the difficulty of the procedure and whether you are sensitive to radiation due to previous procedures, disease, or genetic conditions.   IF your procedure requires a prolonged use of radiation, you will be notified and given written instructions for further action.  It is your responsibility to monitor the irradiated area for the 2 weeks following the procedure and to notify your physician if you are concerned that you have suffered a radiation induced injury.    All of the patient's questions were answered, patient is agreeable to  proceed.  Consent signed and in chart.  Thank you for this interesting consult.  I greatly enjoyed meeting Carly [name] and look forward to participating in their care.  A copy of this report was sent to the requesting provider on this date.  Electronically Signed: Docia Barrier, PA 09/25/2018, 3:37 PM   I spent a total of    25 Minutes in face to face in clinical consultation, greater than 50% of which was counseling/coordinating care for pancreatic cancer.

## 2018-09-25 NOTE — Progress Notes (Signed)
Both sons given update on patient status.  RN will call back with room assignment and update.   Gerald Stabs:  [phone] Cecilie Lowers:  [phone]

## 2018-09-25 NOTE — Progress Notes (Signed)
Patient brought to PACU to hold for room placement.  Had biliary stent placed today and unable to return home due to generalized weakness and frail condition.  S/P chemo treatment on Monday, 09/23/2018 for pancreatic cancer.  Awaiting hospitalist to consult for further admitting orders.  Will transfer patient to room when bed is assigned, and will continue to monitor until transfer.

## 2018-09-25 NOTE — Discharge Instructions (Signed)
Biliary Drainage Catheter Home Guide A biliary drainage catheter is a thin, flexible tube that is inserted through your skin into the bile ducts in your liver. Bile is a thick yellow or green fluid that helps digest fat in foods. The purpose of a biliary drainage catheter is to keep bile from backing up into your liver. Backup of bile can occur when there is a blockage that prevents bile from moving from the bile ducts into the small intestine as it should. The blockage can be caused by gallstones, a tumor, or scar tissue. There are three types of biliary drainage:  External biliary drainage. With this type, bile is only drained into a collection bag outside your body (external collection bag).  Internal-external biliary drainage. With this type, bile is drained to an external collection bag as well as into your small intestine.  Internal biliary drainage. With this type, bile is only drained into your small intestine. General home care includes these daily actions:   Inspection of your drainage catheter.  Flushing your drainage catheter with saline.  Emptying drainage from the collection bag (if present).  Recording the amount of drainage.  Checking the catheter insertion site for signs of infection. Check for: ? Redness, swelling, or pain. ? Fluid or blood. ? Warmth. ? Pus or a bad smell. How do I inspect my drainage catheter?  Check the dressing to make sure that it is dry and clean.  Look at the skin around the drainage catheter when changing the dressing for any problems such as redness, rash, or skin breakdown.  Check the drainage bag to make sure that drainage fluid is flowing into the bag well. Note the color and amount compared to other days.  Check the drainage catheter and bag for any cracks or kinks in the tubing. How do I change my dressing? The dressing over the drainage catheter should be changed every other day, or more often if needed to keep the dressing dry.  Your health care provider will instruct you about how often to change your dressing. Supplies needed:  Mild soap and warm water.  Split gauze pads, 4 x 4 inches (10 x 10 cm) to use as a dressing sponge.  Gauze pads, 4 x 4 inches (10 x 10 cm) or adhesive dressing cover.  Paper tape. How to change the dressing: 1. Wash your hands with soap and water. 2. Gently remove the old dressing. Avoid using scissors to remove the dressing because they may damage the drainage catheter. 3. Wash the skin around the insertion site with mild soap and warm water, rinse well, then pat the area dry with a clean cloth. 4. Check the skin around the drainage catheter for redness or swelling, or for yellow or green discharge that has a bad smell. 5. If the drainage catheter was stitched (sutured) to the skin, inspect the suture to make sure it is still anchored in the skin. 6. Do not apply creams, ointments, or alcohol to the site. Allow the skin to air-dry completely before you apply a new dressing. 7. Place the drainage catheter through the slit in a dressing sponge. The dressing sponge should slide under the disk that holds the drainage catheter in place. 8. Cover the drainage catheter and the dressing sponge with a 4 x 4 inch (10 x 10 cm) gauze. The drainage catheter should rest on the gauze and not on the skin. 9. Tape the dressing to the skin. 10. You may be instructed to use  an adhesive dressing covering over the top of this in place of the gauze and tape. 11. Wash your hands with soap and water. How do I flush my drainage catheter? Biliary drainagecatheters should be flushed daily, or as often as told by your health care provider. The end of the drainage catheter is closed using an IV cap. A syringe can be directly connected to the IV cap. Supplies needed:  Alcohol swab.  10 mL prefilled normal saline syringe. How to flush the drainage catheter: 1. Wash your hands with soap and water. 2. If your  drainage catheter has a stopcock attached to it, turn the stopcock toward the drainage bag. This will allow the saline to flow in the direction of your body. 3. Clean the IV cap with an alcohol swab. 4. Screw the tip of a 10 mL normal saline syringe onto the IV cap. 5. Inject the saline over 5-10 seconds. If you feel resistance while injecting, stop immediately. Avoid  pulling back on the plunger. Doing that could increase your risk of infection. 6. Remove the syringe from the cap. Turn the stopcock so that fluid flows from your body into the drainage bag. You may notice more fluid flowing into the bag after you have completed the flush. How do I attach a bag to my drainage catheter? If you are having trouble with your internal biliary drain, you may be directed by your health care provider to use bag drainage until you can be seen to fix the problem. For this reason, you should always have a collection bag and connecting tubing at home. If you do not have these supplies, remember to ask for them at your next appointment. 1. Remove the bag and the connecting tubing from their packaging. 2. Connect the funnel end of the tubing to the bag's cone-shaped stem. 3. Remove the IV cap from the biliary drain. To do this, unscrew it and replace it with the screw-on end of the tubing. 4. Save the IV cap in a plastic storage bag that can be sealed. How do I empty my collection bag? Empty the collection bag whenever it becomes 2/3 full. Also empty it before you go to sleep. Most collection bags have a drainage valve at the bottom so the bag can be that allows them to be emptied easily. 1. Wash your hands with soap and water. 2. Hold the collection bag over the toilet, basin, or collection container. Use a measuring container if your health care provider told you to measure the drainage. 3. Unscrew the valve to open it, and allow the bag to drain. 4. Close the valve securely to avoid leakage. 5. Use a tissue or  disposable napkin to wipe the valve clean. 6. Wash the measuring container with soap and water. 7. Record the amount of drainage as told by your health care provider. Contact a health care provider if:  Your pain gets worse after it had improved, and it is not relieved with pain medicines.  You have any questions about caring for your drainage catheter or collection bag.  You have any of these around your catheter insertion site or coming from it: ? Skin breakdown. ? Redness, swelling, or pain. ? Fluid or blood. ? Warmth to the touch. ? Pus or a bad smell. Get help right away if:  You have a fever or chills.  Your redness, swelling, or pain at the catheter insertion site gets worse, even though you are cleaning it well.  You have leakage  of bile around the drainage catheter.  Your drainage catheter becomes blocked or clogged.  Your drainage catheter comes out. This information is not intended to replace advice given to you by your health care provider. Make sure you discuss any questions you have with your health care provider. Document Released: 12/04/2012 Document Revised: 01/26/2017 Document Reviewed: 01/03/2016 Elsevier Patient Education  [place].       Moderate Conscious Sedation, Adult, Care After These instructions provide you with information about caring for yourself after your procedure. Your health care provider may also give you more specific instructions. Your treatment has been planned according to current medical practices, but problems sometimes occur. Call your health care provider if you have any problems or questions after your procedure. What can I expect after the procedure? After your procedure, it is common:  To feel sleepy for several hours.  To feel clumsy and have poor balance for several hours.  To have poor judgment for several hours.  To vomit if you eat too soon. Follow these instructions at home: For at least 24 hours after the  procedure:   Do not: ? Participate in activities where you could fall or become injured. ? Drive. ? Use heavy machinery. ? Drink alcohol. ? Take sleeping pills or medicines that cause drowsiness. ? Make important decisions or sign legal documents. ? Take care of children on your own.  Rest. Eating and drinking  Follow the diet recommended by your health care provider.  If you vomit: ? Drink water, juice, or soup when you can drink without vomiting. ? Make sure you have little or no nausea before eating solid foods. General instructions  Have a responsible adult stay with you until you are awake and alert.  Take over-the-counter and prescription medicines only as told by your health care provider.  If you smoke, do not smoke without supervision.  Keep all follow-up visits as told by your health care provider. This is important. Contact a health care provider if:  You keep feeling nauseous or you keep vomiting.  You feel light-headed.  You develop a rash.  You have a fever. Get help right away if:  You have trouble breathing. This information is not intended to replace advice given to you by your health care provider. Make sure you discuss any questions you have with your health care provider. Document Released: 12/04/2012 Document Revised: 01/26/2017 Document Reviewed: 06/05/2015 Elsevier Patient Education  2020 [employer].

## 2018-09-25 NOTE — Progress Notes (Deleted)
Report given to [facility] nurse, Mortimer Fries.  Patient going to room 1529 for overnight observation.  Sons updated.

## 2018-09-25 NOTE — Progress Notes (Signed)
PHARMACY NOTE -  ANTIBIOTIC RENAL DOSE ADJUSTMENT   Request received for Pharmacy to assist with antibiotic renal dose adjustment.  Patient has been initiated on Aztreonam 1gm iv q8hr for Biliary infection. SCr 1.17, estimated CrCl 34 ml/min Current dosage is appropriate and need for further dosage adjustment appears unlikely at present. Will sign off at this time.  Please reconsult if a change in clinical status warrants re-evaluation of dosage.

## 2018-09-25 NOTE — Progress Notes (Signed)
Report given to med surg nurse, Mortimer Fries.  Patient transferred from PACU to room 1529.  Son Cecilie Lowers given update.

## 2018-09-26 ENCOUNTER — Observation Stay (HOSPITAL_COMMUNITY): Payer: Medicare Other

## 2018-09-26 DIAGNOSIS — J45909 Unspecified asthma, uncomplicated: Secondary | ICD-10-CM | POA: Diagnosis not present

## 2018-09-26 DIAGNOSIS — Z515 Encounter for palliative care: Secondary | ICD-10-CM | POA: Diagnosis not present

## 2018-09-26 DIAGNOSIS — R627 Adult failure to thrive: Secondary | ICD-10-CM

## 2018-09-26 DIAGNOSIS — I959 Hypotension, unspecified: Secondary | ICD-10-CM | POA: Diagnosis not present

## 2018-09-26 DIAGNOSIS — Z7189 Other specified counseling: Secondary | ICD-10-CM | POA: Diagnosis not present

## 2018-09-26 DIAGNOSIS — C250 Malignant neoplasm of head of pancreas: Secondary | ICD-10-CM | POA: Diagnosis not present

## 2018-09-26 DIAGNOSIS — D649 Anemia, unspecified: Secondary | ICD-10-CM | POA: Diagnosis not present

## 2018-09-26 DIAGNOSIS — Z9079 Acquired absence of other genital organ(s): Secondary | ICD-10-CM | POA: Diagnosis not present

## 2018-09-26 DIAGNOSIS — T451X5A Adverse effect of antineoplastic and immunosuppressive drugs, initial encounter: Secondary | ICD-10-CM | POA: Diagnosis not present

## 2018-09-26 DIAGNOSIS — Z90722 Acquired absence of ovaries, bilateral: Secondary | ICD-10-CM | POA: Diagnosis not present

## 2018-09-26 DIAGNOSIS — D696 Thrombocytopenia, unspecified: Secondary | ICD-10-CM | POA: Diagnosis not present

## 2018-09-26 DIAGNOSIS — C787 Secondary malignant neoplasm of liver and intrahepatic bile duct: Secondary | ICD-10-CM

## 2018-09-26 DIAGNOSIS — Z452 Encounter for adjustment and management of vascular access device: Secondary | ICD-10-CM | POA: Diagnosis not present

## 2018-09-26 DIAGNOSIS — Z9689 Presence of other specified functional implants: Secondary | ICD-10-CM | POA: Diagnosis not present

## 2018-09-26 DIAGNOSIS — Z8542 Personal history of malignant neoplasm of other parts of uterus: Secondary | ICD-10-CM | POA: Diagnosis not present

## 2018-09-26 DIAGNOSIS — Z7401 Bed confinement status: Secondary | ICD-10-CM | POA: Diagnosis not present

## 2018-09-26 DIAGNOSIS — D6481 Anemia due to antineoplastic chemotherapy: Secondary | ICD-10-CM

## 2018-09-26 DIAGNOSIS — Z6825 Body mass index (BMI) 25.0-25.9, adult: Secondary | ICD-10-CM | POA: Diagnosis not present

## 2018-09-26 DIAGNOSIS — E43 Unspecified severe protein-calorie malnutrition: Secondary | ICD-10-CM | POA: Diagnosis not present

## 2018-09-26 DIAGNOSIS — I10 Essential (primary) hypertension: Secondary | ICD-10-CM

## 2018-09-26 DIAGNOSIS — D6959 Other secondary thrombocytopenia: Secondary | ICD-10-CM | POA: Diagnosis present

## 2018-09-26 DIAGNOSIS — Z90710 Acquired absence of both cervix and uterus: Secondary | ICD-10-CM | POA: Diagnosis not present

## 2018-09-26 DIAGNOSIS — E785 Hyperlipidemia, unspecified: Secondary | ICD-10-CM | POA: Diagnosis present

## 2018-09-26 DIAGNOSIS — D630 Anemia in neoplastic disease: Secondary | ICD-10-CM | POA: Diagnosis not present

## 2018-09-26 DIAGNOSIS — R000 Tachycardia, unspecified: Secondary | ICD-10-CM | POA: Diagnosis not present

## 2018-09-26 DIAGNOSIS — M2550 Pain in unspecified joint: Secondary | ICD-10-CM | POA: Diagnosis not present

## 2018-09-26 DIAGNOSIS — Z436 Encounter for attention to other artificial openings of urinary tract: Secondary | ICD-10-CM | POA: Diagnosis not present

## 2018-09-26 DIAGNOSIS — C801 Malignant (primary) neoplasm, unspecified: Secondary | ICD-10-CM

## 2018-09-26 DIAGNOSIS — M25531 Pain in right wrist: Secondary | ICD-10-CM | POA: Diagnosis present

## 2018-09-26 DIAGNOSIS — R0602 Shortness of breath: Secondary | ICD-10-CM | POA: Diagnosis not present

## 2018-09-26 DIAGNOSIS — C259 Malignant neoplasm of pancreas, unspecified: Secondary | ICD-10-CM | POA: Diagnosis not present

## 2018-09-26 DIAGNOSIS — J189 Pneumonia, unspecified organism: Secondary | ICD-10-CM | POA: Diagnosis not present

## 2018-09-26 DIAGNOSIS — R5381 Other malaise: Secondary | ICD-10-CM | POA: Diagnosis not present

## 2018-09-26 DIAGNOSIS — Z66 Do not resuscitate: Secondary | ICD-10-CM | POA: Diagnosis not present

## 2018-09-26 DIAGNOSIS — E039 Hypothyroidism, unspecified: Secondary | ICD-10-CM | POA: Diagnosis present

## 2018-09-26 DIAGNOSIS — K831 Obstruction of bile duct: Secondary | ICD-10-CM | POA: Diagnosis present

## 2018-09-26 DIAGNOSIS — Y95 Nosocomial condition: Secondary | ICD-10-CM | POA: Diagnosis present

## 2018-09-26 DIAGNOSIS — R0600 Dyspnea, unspecified: Secondary | ICD-10-CM | POA: Diagnosis not present

## 2018-09-26 DIAGNOSIS — E119 Type 2 diabetes mellitus without complications: Secondary | ICD-10-CM | POA: Diagnosis present

## 2018-09-26 DIAGNOSIS — E46 Unspecified protein-calorie malnutrition: Secondary | ICD-10-CM | POA: Diagnosis present

## 2018-09-26 DIAGNOSIS — R531 Weakness: Secondary | ICD-10-CM | POA: Diagnosis not present

## 2018-09-26 LAB — COMPREHENSIVE METABOLIC PANEL
ALT: 48 U/L — ABNORMAL HIGH (ref 0–44)
AST: 63 U/L — ABNORMAL HIGH (ref 15–41)
Albumin: 1.7 g/dL — ABNORMAL LOW (ref 3.5–5.0)
Alkaline Phosphatase: 313 U/L — ABNORMAL HIGH (ref 38–126)
Anion gap: 8 (ref 5–15)
BUN: 41 mg/dL — ABNORMAL HIGH (ref 8–23)
CO2: 23 mmol/L (ref 22–32)
Calcium: 8.6 mg/dL — ABNORMAL LOW (ref 8.9–10.3)
Chloride: 106 mmol/L (ref 98–111)
Creatinine, Ser: 1.02 mg/dL — ABNORMAL HIGH (ref 0.44–1.00)
GFR calc Af Amer: >60 mL/min (ref >60)
GFR calc non Af Amer: 53 mL/min — ABNORMAL LOW (ref >60)
Glucose, Bld: 172 mg/dL — ABNORMAL HIGH (ref 70–99)
Potassium: 4.2 mmol/L (ref 3.5–5.1)
Sodium: 137 mmol/L (ref 135–145)
Total Bilirubin: 3.6 mg/dL — ABNORMAL HIGH (ref 0.3–1.2)
Total Protein: 5.5 g/dL — ABNORMAL LOW (ref 6.5–8.1)

## 2018-09-26 LAB — URINALYSIS, ROUTINE W REFLEX MICROSCOPIC
Glucose, UA: NEGATIVE mg/dL
Ketones, ur: NEGATIVE mg/dL
Nitrite: NEGATIVE
Protein, ur: 100 mg/dL — AB
Specific Gravity, Urine: 1.026 (ref 1.005–1.030)
pH: 5 (ref 5.0–8.0)

## 2018-09-26 LAB — CBC
HCT: 25.8 % — ABNORMAL LOW (ref 36.0–46.0)
Hemoglobin: 8.3 g/dL — ABNORMAL LOW (ref 12.0–15.0)
MCH: 31.7 pg (ref 26.0–34.0)
MCHC: 32.2 g/dL (ref 30.0–36.0)
MCV: 98.5 fL (ref 80.0–100.0)
Platelets: 33 10*3/uL — ABNORMAL LOW (ref 150–400)
RBC: 2.62 MIL/uL — ABNORMAL LOW (ref 3.87–5.11)
RDW: 18.7 % — ABNORMAL HIGH (ref 11.5–15.5)
WBC: 5 10*3/uL (ref 4.0–10.5)
nRBC: 0 % (ref 0.0–0.2)

## 2018-09-26 LAB — GLUCOSE, CAPILLARY
Glucose-Capillary: 142 mg/dL — ABNORMAL HIGH (ref 70–99)
Glucose-Capillary: 162 mg/dL — ABNORMAL HIGH (ref 70–99)
Glucose-Capillary: 168 mg/dL — ABNORMAL HIGH (ref 70–99)
Glucose-Capillary: 252 mg/dL — ABNORMAL HIGH (ref 70–99)
Glucose-Capillary: 265 mg/dL — ABNORMAL HIGH (ref 70–99)
Glucose-Capillary: 151 mg/dL — ABNORMAL HIGH (ref 70–99)
Glucose-Capillary: 161 mg/dL — ABNORMAL HIGH (ref 70–99)
Glucose-Capillary: 154 mg/dL — ABNORMAL HIGH (ref 70–99)

## 2018-09-26 LAB — URIC ACID: Uric Acid, Serum: 4.6 mg/dL (ref 2.5–7.1)

## 2018-09-26 LAB — AMMONIA: Ammonia: 40 umol/L — ABNORMAL HIGH (ref 9–35)

## 2018-09-26 LAB — TSH: TSH: 7.199 u[IU]/mL — ABNORMAL HIGH (ref 0.350–4.500)

## 2018-09-26 MED ORDER — SODIUM CHLORIDE 0.9 % IV SOLN
1.0000 g | Freq: Three times a day (TID) | INTRAVENOUS | Status: DC
  Administered 2018-09-26 – 2018-09-30 (×12): 1 g via INTRAVENOUS
  Filled 2018-09-26 (×14): qty 1

## 2018-09-26 MED ORDER — ENSURE ENLIVE PO LIQD
237.0000 mL | Freq: Two times a day (BID) | ORAL | Status: DC
  Administered 2018-09-26 – 2018-09-27 (×3): 237 mL via ORAL

## 2018-09-26 MED ORDER — SODIUM CHLORIDE 0.9% FLUSH: 10.0000 mL | INTRAVENOUS | Status: DC | PRN

## 2018-09-26 MED ORDER — ELTROMBOPAG OLAMINE 50 MG PO TABS: 100.0000 mg | ORAL_TABLET | Freq: Every day | ORAL | Status: DC

## 2018-09-26 MED ORDER — NYSTATIN 100000 UNIT/ML MT SUSP
5.0000 mL | Freq: Four times a day (QID) | OROMUCOSAL | Status: DC
  Administered 2018-09-26 – 2018-09-30 (×9): 500000 [IU] via ORAL
  Filled 2018-09-26 (×10): qty 5

## 2018-09-26 MED ORDER — SODIUM CHLORIDE 0.9 % IV BOLUS
250.0000 mL | Freq: Once | INTRAVENOUS | Status: AC
  Administered 2018-09-26: 12:00:00 250 mL via INTRAVENOUS

## 2018-09-26 NOTE — Evaluation (Signed)
Occupational Therapy Evaluation Patient Details Name: Carly [name] MRN: [id number] DOB: 08/05/1940 Today's Date: 09/26/2018    History of Present Illness 78 y o female with adenocarcinoma of head of pancreas was admitted 7/29 for observation by IR for stenting of biliary drain.  Found to be tachy and fever of 99.    She has been undergoing chemo.  PMH:   uterine CA, HTN and DM   Clinical Impression   Pt was admitted for the above. She reports that she lives alone and was independent with adls up until a week ago. Since then, her son has been staying with her.  Pt is limited by RUE weakness, R wrist pain and decreased activity tolerance. She performed sit to stand and SPT with min A (+2 safety).  Based on clinical judgment, she needs extensive assistance with adls.  Will follow in acute setting with the goals listed below    Follow Up Recommendations  SNF;Supervision/Assistance - 24 hour.  It home with 24/7, then [facility] also   Equipment Recommendations  3 in 1 bedside commode    Recommendations for Other Services       Precautions / Restrictions Precautions Precautions: Fall Precaution Comments: RUE weak and R wrist painful Restrictions Weight Bearing Restrictions: No      Mobility Bed Mobility Overal bed mobility: Needs Assistance Bed Mobility: Rolling;Supine to Sit Rolling: Min assist   Supine to sit: Min assist;Mod assist     General bed mobility comments: pt pushed up on RUE and this was painful.  Light assistance for legs and assist for trunk  Transfers Overall transfer level: Needs assistance Equipment used: 2 person hand held assist Transfers: Sit to/from [facility] Sit to Stand: Min assist Stand pivot transfers: Min assist       General transfer comment: steadying assistance    Balance                                           ADL either performed or assessed with clinical judgement   ADL Overall ADL's : Needs  assistance/impaired                         Toilet Transfer: Minimal assistance;Stand-pivot;+2 for safety/equipment             General ADL Comments: Pt sat EOB for several minutes then agreeable to up to chair.  Based on clinical judgment, she needs full set up for meals, min A for grooming and max A for ADLs due to decreased activity tolerance     Vision         Perception     Praxis      Pertinent Vitals/Pain Pain Assessment: Faces Faces Pain Scale: Hurts whole lot Pain Location: R wrist Pain Descriptors / Indicators: Aching Pain Intervention(s): Limited activity within patient's tolerance;Monitored during session;Repositioned     Hand Dominance Right   Extremity/Trunk Assessment Upper Extremity Assessment Upper Extremity Assessment: RUE deficits/detail RUE Deficits / Details: pt uses LUE to assist with lifting; reports weakness started about a week ago. R wrist very painful.  She did push through hand a little to stand           Communication Communication Communication: No difficulties   Cognition Arousal/Alertness: Awake/alert Behavior During Therapy: [facility] for tasks assessed/performed  General Comments: appears WFLs   General Comments       Exercises     Shoulder Instructions      Home Living Family/patient expects to be discharged to:: Private residence Living Arrangements: Children(alone; son has been staying)   Type of Home: House                 Bathroom Toilet: Standard     Home Equipment: None   Additional Comments: pt states she didn't use toilet much:  no BSC.  Has lift chair      Prior Functioning/Environment          Comments: states that until a week ago, she was independent. Son has been staying with her        OT Problem List: Decreased strength;Decreased activity tolerance;Impaired balance (sitting and/or standing);Pain;Impaired UE functional  use;Decreased knowledge of use of DME or AE      OT Treatment/Interventions: Self-care/ADL training;Therapeutic exercise;DME and/or AE instruction;Patient/family education;Balance training;Therapeutic activities    OT Goals(Current goals can be found in the care plan section) Acute Rehab OT Goals Patient Stated Goal: none stated; agreeable to working with therapy OT Goal Formulation: With patient Time For Goal Achievement: 10/10/18 Potential to Achieve Goals: Good ADL Goals Pt Will Transfer to Toilet: with supervision;bedside commode;stand pivot transfer Pt Will Perform Toileting - Clothing Manipulation and hygiene: with min assist;sit to/from stand;sitting/lateral leans Additional ADL Goal #1: pt will perform UB adls with min A and LB adls with mod A, with rest breaks Additional ADL Goal #2: Pt will use RUE as gross assist as tolerated  OT Frequency: Min 2X/week   Barriers to D/C:            Co-evaluation              AM-PAC OT "6 Clicks" Daily Activity     Outcome Measure Help from another person eating meals?: A Little Help from another person taking care of personal grooming?: A Little Help from another person toileting, which includes using toliet, bedpan, or urinal?: A Lot Help from another person bathing (including washing, rinsing, drying)?: A Lot Help from another person to put on and taking off regular upper body clothing?: A Lot Help from another person to put on and taking off regular lower body clothing?: A Lot 6 Click Score: 14   End of Session Nurse Communication: Mobility status  Activity Tolerance: Patient limited by fatigue Patient left: in chair;with call bell/phone within reach;with chair alarm set  OT Visit Diagnosis: Muscle weakness (generalized) (M62.81);Unsteadiness on feet (R26.81)                Time: 0175-1025 OT Time Calculation (min): 19 min Charges:  OT General Charges $OT Visit: 1 Visit OT Evaluation $OT Eval Low Complexity: Kimble, OTR/L Acute Rehabilitation Services [phone] [facility] pager [phone] office 09/26/2018  Kim 09/26/2018, 2:49 PM

## 2018-09-26 NOTE — Progress Notes (Addendum)
Carly [name]   DOB:[birth year]   XT#:[id number]   [id number]#:[id number]  Oncology follow up   Subjective: Patient is well-known to me, under my care for her metastatic pancreatic cancer.  She underwent PTC last week and biliary stenting by IR yesterday, admitted after procedure due to overall poor condition.    Objective:  Vitals:   09/26/18 1955 09/26/18 2207  BP: (!) 108/57 (!) 101/54  Pulse: (!) 124 74  Resp: 16 18  Temp: 99 F (37.2 C) 98.6 F (37 C)  SpO2: 98% 93%    Body mass index is 25.4 kg/m.  Intake/Output Summary (Last 24 hours) at 09/26/2018 2340 Last data filed at 09/26/2018 1800 Gross per 24 hour  Intake 1027.79 ml  Output 420 ml  Net 607.79 ml     (+) jaundice, pale, chronic ill appearing   No peripheral adenopathy  Lungs clear -- no rales or rhonchi  Heart regular rate and rhythm  Abdomen benign  MSK no focal spinal tenderness, no peripheral edema  Neuro nonfocal   CBG (last 3)  Recent Labs    09/26/18 1159 09/26/18 1624 09/26/18 1947  GLUCAP 151* 161* 154*     Labs:  Lab Results  Component Value Date   WBC 5.0 09/26/2018   HGB 8.3 (L) 09/26/2018   HCT 25.8 (L) 09/26/2018   MCV 98.5 09/26/2018   PLT 33 (L) 09/26/2018   NEUTROABS 7.3 09/25/2018    Urine Studies No results for input(s): UHGB, CRYS in the last 72 hours.  Invalid input(s): UACOL, UAPR, USPG, UPH, UTP, UGL, UKET, UBIL, UNIT, UROB, ULEU, UEPI, UWBC, URBC, UBAC, CAST, UCOM, BILUA  Basic Metabolic Panel: Recent Labs  Lab 09/23/18 0900 09/25/18 1350 09/26/18 0557  NA 136 135 137  K 3.9 4.4 4.2  CL 101 101 106  CO2 _0 GLUCOSE 199* 264* 172*  BUN 10 40* 41*  CREATININE 0.73 1.17* 1.02*  CALCIUM 10.1 9.1 8.6*   GFR Estimated Creatinine Clearance: 39.7 mL/min (A) (by C-G formula based on SCr of 1.02 mg/dL (H)). Liver Function Tests: Recent Labs  Lab 09/23/18 0900 09/25/18 1350 09/26/18 0557  AST 121* 81* 63*  ALT 80* 56* 48*  ALKPHOS 691* 356* 313*  BILITOT  5.2* 3.9* 3.6*  PROT 6.3* 6.0* 5.5*  ALBUMIN 2.0* 2.0* 1.7*   No results for input(s): LIPASE, AMYLASE in the last 168 hours. Recent Labs  Lab 09/26/18 1421  AMMONIA 40*   Coagulation profile Recent Labs  Lab 09/25/18 1350  INR 1.3*    CBC: Recent Labs  Lab 09/23/18 0900 09/25/18 1350 09/26/18 0557  WBC 8.0 7.8 5.0  NEUTROABS 6.0 7.3  --   HGB 9.7* 8.4* 8.3*  HCT 29.4* 26.1* 25.8*  MCV 95.8 98.1 98.5  PLT 73* 30* 33*   Cardiac Enzymes: No results for input(s): CKTOTAL, CKMB, CKMBINDEX, TROPONINI in the last 168 hours. BNP: Invalid input(s): POCBNP CBG: Recent Labs  Lab 09/26/18 0419 09/26/18 0753 09/26/18 1159 09/26/18 1624 09/26/18 1947  GLUCAP 162* 142* 151* 161* 154*   D-Dimer No results for input(s): DDIMER in the last 72 hours. Hgb A1c No results for input(s): HGBA1C in the last 72 hours. Lipid Profile No results for input(s): CHOL, HDL, LDLCALC, TRIG, CHOLHDL, LDLDIRECT in the last 72 hours. Thyroid function studies Recent Labs    09/26/18 1340  TSH 7.199*   Anemia work up No results for input(s): VITAMINB12, FOLATE, FERRITIN, TIBC, IRON, RETICCTPCT in the last 72 hours. Microbiology Recent Results (  from the past 240 hour(s))  SARS Coronavirus 2 (CEPHEID - Performed in [facility] hospital lab), Hosp Order     Status: None   Collection Time: 09/18/18 11:21 AM   Specimen: Nasopharyngeal Swab  Result Value Ref Range Status   SARS Coronavirus 2 NEGATIVE NEGATIVE Final    Comment: (NOTE) If result is NEGATIVE SARS-CoV-2 target nucleic acids are NOT DETECTED. The SARS-CoV-2 RNA is generally detectable in upper and lower  respiratory specimens during the acute phase of infection. The lowest  concentration of SARS-CoV-2 viral copies this assay can detect is 250  copies / mL. A negative result does not preclude SARS-CoV-2 infection  and should not be used as the sole basis for treatment or other  patient management decisions.  A negative result  may occur with  improper specimen collection / handling, submission of specimen other  than nasopharyngeal swab, presence of viral mutation(s) within the  areas targeted by this assay, and inadequate number of viral copies  (<250 copies / mL). A negative result must be combined with clinical  observations, patient history, and epidemiological information. If result is POSITIVE SARS-CoV-2 target nucleic acids are DETECTED. The SARS-CoV-2 RNA is generally detectable in upper and lower  respiratory specimens dur ing the acute phase of infection.  Positive  results are indicative of active infection with SARS-CoV-2.  Clinical  correlation with patient history and other diagnostic information is  necessary to determine patient infection status.  Positive results do  not rule out bacterial infection or co-infection with other viruses. If result is PRESUMPTIVE POSTIVE SARS-CoV-2 nucleic acids MAY BE PRESENT.   A presumptive positive result was obtained on the submitted specimen  and confirmed on repeat testing.  While 2019 novel coronavirus  (SARS-CoV-2) nucleic acids may be present in the submitted sample  additional confirmatory testing may be necessary for epidemiological  and / or clinical management purposes  to differentiate between  SARS-CoV-2 and other Sarbecovirus currently known to infect humans.  If clinically indicated additional testing with an alternate test  methodology [phone]) is advised. The SARS-CoV-2 RNA is generally  detectable in upper and lower respiratory sp ecimens during the acute  phase of infection. The expected result is Negative. Fact Sheet for Patients:  StrictlyIdeas.no Fact Sheet for Healthcare Providers: BankingDealers.co.za This test is not yet approved or cleared by the [place] FDA and has been authorized for detection and/or diagnosis of SARS-CoV-2 by FDA under an Emergency Use Authorization (EUA).   This EUA will remain in effect (meaning this test can be used) for the duration of the COVID-19 declaration under Section 564(b)(1) of the Act, 21 U.S.C. section 360bbb-3(b)(1), unless the authorization is terminated or revoked sooner. Performed at [facility], [address], [place], [place] [address]       Studies:  Dg Chest Port 1 View  Result Date: 09/26/2018 CLINICAL DATA:  Shortness of breath EXAM: PORTABLE CHEST 1 VIEW COMPARISON:  Chest radiograph 08/02/2010 FINDINGS: Right anterior chest wall Port-A-Cath is present with tip projecting over the superior vena cava. Enlarged cardiac and mediastinal contours. Aortic atherosclerosis. Mild left pleural effusion with underlying consolidation. No pneumothorax. IMPRESSION: Small left pleural effusion with underlying consolidation left lower hemithorax. Electronically Signed   By: Lovey Newcomer M.D.   On: 09/26/2018 12:25   Ir Biliary Stent(s) Existing Access Inc Dilation Cath Exchange  Result Date: 09/25/2018 INDICATION: 78 year old female with advanced pancreatic cancer and malignant obstructed jaundice. She underwent percutaneous transhepatic cholangiogram and biliary drain  placement last week. She presents today for placement of a palliative covered stent. EXAM: 1. Biliary stent placement. 2. Biliary drain exchange. MEDICATIONS: 500 mg Flagyl and 500 mg aztreonam; The antibiotic was administered within an appropriate time frame prior to the initiation of the procedure. ANESTHESIA/SEDATION: Moderate (conscious) sedation was employed during this procedure. A total of Versed 1 mg and Fentanyl 50 mcg was administered intravenously. Moderate Sedation Time: 38 minutes. The patient's level of consciousness and vital signs were monitored continuously by radiology nursing throughout the procedure under my direct supervision. FLUOROSCOPY TIME:  Fluoroscopy Time: 9 minutes 48 seconds (160 mGy). COMPLICATIONS: SIR LEVEL B - Normal  therapy, includes overnight admission for observation. Patient developed low-grade fever, tachycardia and tachypnea during recovery following the procedure. She was admitted for observation. PROCEDURE: Informed written consent was obtained from the patient after a thorough discussion of the procedural risks, benefits and alternatives. All questions were addressed. Maximal Sterile Barrier Technique was utilized including caps, mask, sterile gowns, sterile gloves, sterile drape, hand hygiene and skin antiseptic. A timeout was performed prior to the initiation of the procedure. Local anesthesia was attained around the existing drain exit site using 1% lidocaine. The retention suture was cut. The drain was transected and removed over a Bentson wire. An 8 French sheath was then advanced over the wire and into the duodenum. A pull-back, over the wire cholangiogram was then performed through the sheath. 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 The duodenum remains patent with good peristalsis. Biliary ductal dilatation has improved following placement of the internal/external biliary drainage catheter. A 5 French angled catheter was advanced over the wire into the duodenum. The Bentson wire was exchanged for a superstiff Amplatz wire which was advanced further into the duodenum. The angiographic catheter was removed. The 8 French sheath was removed and exchanged for a 9 French sheath which was advanced into the duodenum. A 10 x 60 wall flex covered stent was advanced through the sheath and across the stenosis. The stent was then successfully deployed across the stenosis. Post dilation was performed using an 8 x 40 mm Conquest balloon. Contrast injection confirms patency of the biliary tree. The distal aspect of the stent is just within the duodenum. A new 10 [place] biliary drain was advanced over the wire and formed with the locking loop in the duodenum. The drain was capped and  secured to the skin with 0 Prolene suture. The patient tolerated the procedure well. IMPRESSION: Successful placement of a 10 x 60 wall flex covered stent across the malignant common bile duct stricture. Successful exchange for a new 10 French internal/external biliary drainage catheter. PLAN: 1. Admit for observation. 2. If patient doing well tomorrow, drain to be capped prior to discharge. 3. Patient to return Interventional Radiology in 4-6 weeks for cholangiogram and possible removal of biliary drain if stent is functioning adequately. Signed, Criselda Peaches, MD, [facility] Electronically Signed   By: Jacqulynn Cadet M.D.   On: 09/25/2018 18:51    Assessment: 78 y.o. 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  Obstructive jaundice secondary to cancer, status post PTC and CBD stent placement  2. 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  Moderate anemia and thrombocytopenia, secondary to chemotherapy 4.  Abdominal pain, anorexia, and malnutrition  5. Deconditioning  6. Code status: she agrees with DNR/DNI  Plan:  -Jaileigh is not doing  well, her condition has overall deteriorated quickly in the past few weeks.  Despite successful biliary drainage tube placement and improved jaundice, she clinically is doing worth, partially related to chemo.  -I do not think she is a candidate for more chemo, I recommend we focus on her quality of life and palliative care.  I previously discussed hospice with her, she was not ready, but agreed to think about it.  -will consult palliative care  -I will call her daughter in law to bring her promacta to hospital and she can continue  -blood transfusion if Hg<8.0 and plt transfusion if plt<15K or active bleeding  -I will f/u, appreciate hospitalist team's excellent care   Truitt Merle, MD 09/26/2018

## 2018-09-26 NOTE — H&P (Addendum)
History and Physical  Carly [name] UKG:[id number] DOB: February 25, 1941 DOA: 09/25/2018  PCP: [name], MD Patient coming from: Home.   I have personally briefly reviewed patient's old medical records in [place]   Chief Complaint: Generalized weakness, feeling tired.  HPI: Carly [name] is a 78 y.o. 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 Burr Medico, recent  chemo on Monday (Abraxane) who was admitted on 09/25/2018 by IR for internalization/stenting of biliary  Drain.    Patient was admitted overnight for observation, Iv fluids, IV antibiotics, post procedure patient was noticed to be tachycardic and tachypnea, tempeture at 99. Patient was evaluated this morning by Dr. Laurence Ferrari, and patient was not considered to be stable to be discharged.  Patient continue to be tachycardic, mild tachypneic and with generalized weakness.  Patient reported that she does not feel well.  She feels weak,  tired.  On my evaluation she denies  chest pain or shortness of breath.  She report poor oral intake, she does not have an appetite.   She denies dysuria, cough, sore throat.  She does relate pain at the site of the drain.  She lives at home by herself.with past medical history significant for diabetes type 2, hypertension, hyperlipidemia, adenocar  She is complaining of right wrist pain.  The pain is started the day prior.   Review of Systems: All systems reviewed and apart from history of presenting illness, are negative.  Past Medical History:  Diagnosis Date  . Allergic rhinitis    Skin Test 04-14-2009  . Asthma   . Cancer ([place]) 1981   adenocarcinoma of uterus  . Cancer ([place]) 2019   PANCREATIC   . Diabetes mellitus without complication ([place])    type II, no meds per pt  . Family history of breast cancer   . History of uterine cancer   . Hyperlipemia   . Hypertension   . Insomnia    Past Surgical History:  Procedure Laterality  Date  . ABDOMINAL HYSTERECTOMY    . CARPAL TUNNEL RELEASE    . ESOPHAGOGASTRODUODENOSCOPY (EGD) WITH PROPOFOL N/A 09/11/2018   Procedure: ESOPHAGOGASTRODUODENOSCOPY (EGD) WITH PROPOFOL;  Surgeon: Arta Silence, MD;  Location: [facility];  Service: Endoscopy;  Laterality: N/A;  . EUS N/A 06/13/2017   Procedure: FULL UPPER ENDOSCOPIC ULTRASOUND (EUS) RADIAL;  Surgeon: Arta Silence, MD;  Location: [facility];  Service: Endoscopy;  Laterality: N/A;  . I&D EXTREMITY  09/17/2011   Procedure: IRRIGATION AND DEBRIDEMENT EXTREMITY;  Surgeon: Roseanne Kaufman, MD;  Location: [facility];  Service: Orthopedics;  Laterality: Left;  with drain placement  . IR BILIARY STENT(S) EXISTING ACCESS INC DILATION CATH EXCHANGE  09/25/2018  . IR FLUORO GUIDE PORT INSERTION RIGHT  07/18/2017  . IR INT EXT BILIARY DRAIN WITH CHOLANGIOGRAM  09/18/2018  . IR US GUIDE VASC ACCESS RIGHT  07/18/2017  . NASAL SEPTOPLASTY W/ TURBINOPLASTY    . pelvic floor rebuild    . TONSILLECTOMY    . TOTAL ABDOMINAL HYSTERECTOMY W/ BILATERAL SALPINGOOPHORECTOMY     Social History:  reports that she quit smoking about 40 years ago. Her smoking use included cigarettes. She has a 7.50 pack-year smoking history. She has never used smokeless tobacco. She reports current alcohol use. She reports that she does not use drugs.   Allergies  Allergen Reactions  . Cephalexin Hives and Swelling  . Nabumetone Hives and Swelling    Family History  Problem Relation Age of  Onset  . Stroke Mother 32  . Heart attack Father 40  . Dementia Brother   . Hypertension Brother   . Skin cancer Brother   . Breast cancer Maternal Grandmother        dx >50  . Breast cancer Cousin        mat first cousin, dx in her 40s  . Cancer Cousin        oral cancer  . Breast cancer Cousin        mat first cousin, dx in her 37s  . Heart disease Maternal Uncle   . Other Maternal Grandfather        suicide  . Heart disease Paternal Grandmother     Prior to  Admission medications   Medication Sig Start Date End Date Taking? Authorizing Provider  ALPRAZolam Duanne Moron) 0.5 MG tablet Take 1 tablet (0.5 mg total) by mouth at bedtime as needed for anxiety. Patient taking differently: Take 0.25 mg by mouth at bedtime as needed for anxiety.  06/17/18  Yes Truitt Merle, MD  atorvastatin (LIPITOR) 10 MG tablet Take 10 mg by mouth at bedtime.    Yes [provider]  CALCIUM PO Take 1 tablet by mouth once a week.    Yes [provider]  Cholecalciferol (VITAMIN D) 2000 units CAPS Take 1 capsule by mouth once a week.    Yes [provider]  cyanocobalamin 2000 MCG tablet Take 2,000 mcg by mouth every evening.   Yes [provider]  diphenoxylate-atropine (LOMOTIL) 2.5-0.025 MG tablet Take 1-2 tablets by mouth 4 (four) times daily as needed for diarrhea or loose stools. 02/04/18  Yes Truitt Merle, MD  hydrochlorothiazide (HYDRODIURIL) 25 MG tablet Take 25 mg by mouth daily as needed (swelling (typically whenever traveling to beach)).  11/29/11  Yes [provider]  HYDROcodone-acetaminophen (NORCO) 5-325 MG tablet Take 1 tablet by mouth every 6 (six) hours as needed for moderate pain. 08/29/17  Yes Truitt Merle, MD  hyoscyamine (LEVBID) 0.375 MG 12 hr tablet Take 0.375 mg by mouth every 12 (twelve) hours as needed for cramping.    Yes [provider]  levothyroxine (SYNTHROID, LEVOTHROID) 25 MCG tablet Take 25 mcg by mouth daily before breakfast.    Yes [provider]  lidocaine-prilocaine (EMLA) cream Apply 1 application topically as needed. Patient taking differently: Apply 1 application topically as needed (prior to port-a-cath being accessed).  09/02/18  Yes Alla Feeling, NP  loratadine (CLARITIN) 10 MG tablet Take 10 mg by mouth daily.   Yes [provider]  magic mouthwash w/lidocaine SOLN Take 5 mLs by mouth 3 (three) times daily as needed for mouth pain. 09/23/18  Yes Truitt Merle, MD  megestrol (MEGACE  ES) 625 MG/5ML suspension Take 5 mLs (625 mg total) by mouth daily. 09/23/18  Yes Truitt Merle, MD  metFORMIN (GLUCOPHAGE) 500 MG tablet TAKE 1 TABLET BY MOUTH EVERY DAY WITH BREAKFAST Patient taking differently: Take 500 mg by mouth daily with breakfast.  09/10/18  Yes Truitt Merle, MD  omeprazole (PRILOSEC) 20 MG capsule Take 20 mg by mouth daily before breakfast.  11/29/11  Yes [provider]  ondansetron (ZOFRAN) 8 MG tablet Take 1 tablet (8 mg total) by mouth every 8 (eight) hours as needed for nausea or vomiting. 09/02/18  Yes Alla Feeling, NP  ONETOUCH DELICA LANCETS 38S MISC USE TO TEST YOUR BLOOD SUGAR ONCE A DAY FASTING AND 2 HRS AFTER A MEAL AS NEEDED 04/26/17  Yes  [provider]  ONETOUCH VERIO test strip USE TO TEST YOUR BLOOD SUGAR ONCE A DAY FASTING & 2 HRS AFTER A MEAL AS NEEDED 04/26/17  Yes [provider]  potassium chloride SA (KLOR-CON M20) 20 MEQ tablet Take 1 tablet (20 mEq total) by mouth 2 (two) times daily. Patient taking differently: Take 20 mEq by mouth daily.  05/06/18  Yes Truitt Merle, MD  prochlorperazine (COMPAZINE) 10 MG tablet Take 1 tablet (10 mg total) by mouth every 6 (six) hours as needed for nausea or vomiting. 09/02/18  Yes Alla Feeling, NP  ROCKLATAN 0.02-0.005 % SOLN Place 1 drop into both eyes at bedtime.  11/21/17  Yes [provider]  traMADol (ULTRAM) 50 MG tablet Take 50 mg by mouth every 6 (six) hours as needed (pain).   Yes [provider]  traZODone (DESYREL) 100 MG tablet TAKE 1 TABLET BY MOUTH EVERYDAY AT BEDTIME Patient taking differently: Take 50 mg by mouth at bedtime.  06/19/18  Yes Princess Bruins, MD  eltrombopag (PROMACTA) 50 MG tablet TAKE 2 TABLETS (100 MG) BY MOUTH EVERY DAY ON AN EMPTY STOMACH, 1 HOUR BEFORE OR 2 HOURS AFTER A MEAL Patient not taking: Reported on 09/23/2018 08/26/18   Truitt Merle, MD   Physical Exam: Vitals:   09/25/18 2030 09/25/18 2050 09/26/18 0447 09/26/18 1052  BP: (!) 101/57 110/60  102/65 111/65  Pulse: (!) 119 (!) 108 (!) 109 (!) 109  Resp:  17  14  Temp:  98.8 F (37.1 C) 98.4 F (36.9 C) (!) 97.5 F (36.4 C)  TempSrc:  Oral Oral Oral  SpO2: 97% 98% 98% 98%  Weight:  63 kg    Height:  5\' 2"  (1.575 m)       General exam: Chronic ill-appearing generalized weakness.  Mild icteric  Head, eyes and ENT: Nontraumatic and normocephalic. Pupils equally reacting to light and accommodation. Oral mucosa moist.  Neck: Supple. No JVD, carotid bruit or thyromegaly.  Lymphatics: No lymphadenopathy.  Respiratory system: Clear to auscultation. No increased work of breathing.  Cardiovascular system: S1 and S2 heard, RRR. No JVD, murmurs, gallops, clicks or pedal edema.  Gastrointestinal system: Abdomen is nondistended, soft and nontender. Normal bowel sounds heard. No organomegaly or masses appreciated.  Central nervous system: Alert and oriented.  Generalized weakness, nonfocal  Extremities: Symmetric 5 x 5 power. Peripheral pulses symmetrically felt.   Skin: No rashes or acute findings.  Musculoskeletal system: Negative exam.  Psychiatry: Pleasant and cooperative.  Flat affect   Labs on Admission:  Basic Metabolic Panel: Recent Labs  Lab 09/23/18 0900 09/25/18 1350 09/26/18 0557  NA 136 135 137  K 3.9 4.4 4.2  CL 101 101 106  CO2 25 22 23   GLUCOSE 199* 264* 172*  BUN 10 40* 41*  CREATININE 0.73 1.17* 1.02*  CALCIUM 10.1 9.1 8.6*   Liver Function Tests: Recent Labs  Lab 09/23/18 0900 09/25/18 1350 09/26/18 0557  AST 121* 81* 63*  ALT 80* 56* 48*  ALKPHOS 691* 356* 313*  BILITOT 5.2* 3.9* 3.6*  PROT 6.3* 6.0* 5.5*  ALBUMIN 2.0* 2.0* 1.7*   No results for input(s): LIPASE, AMYLASE in the last 168 hours. No results for input(s): AMMONIA in the last 168 hours. CBC: Recent Labs  Lab 09/23/18 0900 09/25/18 1350 09/26/18 0557  WBC 8.0 7.8 5.0  NEUTROABS 6.0 7.3  --   HGB 9.7* 8.4* 8.3*  HCT 29.4* 26.1* 25.8*  MCV 95.8 98.1 98.5  PLT 73*  30*  33*   Cardiac Enzymes: No results for input(s): CKTOTAL, CKMB, CKMBINDEX, TROPONINI in the last 168 hours.  BNP (last 3 results) No results for input(s): PROBNP in the last 8760 hours. CBG: Recent Labs  Lab 09/25/18 1449 09/25/18 1946 09/26/18 0059 09/26/18 0419 09/26/18 0753  GLUCAP 252* 175* 168* 162* 142*    Radiological Exams on Admission: Dg Chest Port 1 View  Result Date: 09/26/2018 CLINICAL DATA:  Shortness of breath EXAM: PORTABLE CHEST 1 VIEW COMPARISON:  Chest radiograph 08/02/2010 FINDINGS: Right anterior chest wall Port-A-Cath is present with tip projecting over the superior vena cava. Enlarged cardiac and mediastinal contours. Aortic atherosclerosis. Mild left pleural effusion with underlying consolidation. No pneumothorax. IMPRESSION: Small left pleural effusion with underlying consolidation left lower hemithorax. Electronically Signed   By: Lovey Newcomer M.D.   On: 09/26/2018 12:25   Ir Biliary Stent(s) Existing Access Inc Dilation Cath Exchange  Result Date: 09/25/2018 INDICATION: 78 year old female with advanced pancreatic cancer and malignant obstructed jaundice. She underwent percutaneous transhepatic cholangiogram and biliary drain placement last week. She presents today for placement of a palliative covered stent. EXAM: 1. Biliary stent placement. 2. Biliary drain exchange. MEDICATIONS: 500 mg Flagyl and 500 mg aztreonam; The antibiotic was administered within an appropriate time frame prior to the initiation of the procedure. ANESTHESIA/SEDATION: Moderate (conscious) sedation was employed during this procedure. A total of Versed 1 mg and Fentanyl 50 mcg was administered intravenously. Moderate Sedation Time: 38 minutes. The patient's level of consciousness and vital signs were monitored continuously by radiology nursing throughout the procedure under my direct supervision. FLUOROSCOPY TIME:  Fluoroscopy Time: 9 minutes 48 seconds (160 mGy). COMPLICATIONS: SIR LEVEL B  - Normal therapy, includes overnight admission for observation. Patient developed low-grade fever, tachycardia and tachypnea during recovery following the procedure. She was admitted for observation. PROCEDURE: Informed written consent was obtained from the patient after a thorough discussion of the procedural risks, benefits and alternatives. All questions were addressed. Maximal Sterile Barrier Technique was utilized including caps, mask, sterile gowns, sterile gloves, sterile drape, hand hygiene and skin antiseptic. A timeout was performed prior to the initiation of the procedure. Local anesthesia was attained around the existing drain exit site using 1% lidocaine. The retention suture was cut. The drain was transected and removed over a Bentson wire. An 8 French sheath was then advanced over the wire and into the duodenum. A pull-back, over the wire cholangiogram was then performed through the sheath. 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 The duodenum remains patent with good peristalsis. Biliary ductal dilatation has improved following placement of the internal/external biliary drainage catheter. A 5 French angled catheter was advanced over the wire into the duodenum. The Bentson wire was exchanged for a superstiff Amplatz wire which was advanced further into the duodenum. The angiographic catheter was removed. The 8 French sheath was removed and exchanged for a 9 French sheath which was advanced into the duodenum. A 10 x 60 wall flex covered stent was advanced through the sheath and across the stenosis. The stent was then successfully deployed across the stenosis. Post dilation was performed using an 8 x 40 mm Conquest balloon. Contrast injection confirms patency of the biliary tree. The distal aspect of the stent is just within the duodenum. A new 10 [place] biliary drain was advanced over the wire and formed with the locking loop in the duodenum. The drain was  capped and secured to the skin with 0  Prolene suture. The patient tolerated the procedure well. IMPRESSION: Successful placement of a 10 x 60 wall flex covered stent across the malignant common bile duct stricture. Successful exchange for a new 10 French internal/external biliary drainage catheter. PLAN: 1. Admit for observation. 2. If patient doing well tomorrow, drain to be capped prior to discharge. 3. Patient to return Interventional Radiology in 4-6 weeks for cholangiogram and possible removal of biliary drain if stent is functioning adequately. Signed, Criselda Peaches, MD, [facility] Radiology Electronically Signed   By: Jacqulynn Cadet M.D.   On: 09/25/2018 18:51    EKG: Independently reviewed.   Assessment/Plan Active Problems:   Malignant obstructive jaundice (HCC)   1-Malignant obstructive jaundice adenocarcinoma of the pancreas a stage III: Patient admitted for internalization/stenting of biliary drain 7/29 On aztreonam for prophylaxis. Management by IR and Dr. Annamaria Boots Liver function tests trending down. Check ammonia level.  2-Tachycardia; Check orthostatic vitals Suspect related to hypovolemia.  Patient with poor oral intake. Will check EKG. Will increase IV fluids to 75 cc/h IV bolus.  3-Tachypnea: Pneumonia: Chest x-ray showed small left pleural effusion and consolidation of the left hemithorax. I will continue with aztreonam to treat for pneumonia.  4-Failure to thrive ; generalized weakness, poor oral intake. Related to malignancy, recent chemotherapy.  Continue with IV fluids, start Ensure.  Will need PT OT eval.  5-Thrombocytopenia; She was supposed to be on Promacta for thrombocytopenia will discuss with Dr. Annamaria Boots Resume Promacta, family will need to bring medication. Pharmacy will assist.   Hypertension: Patient currently hypotensive.  Hold hydrochlorothiazide.  Hypothyroidism: Continue with Synthroid.   Check TSH.    DVT Prophylaxis: SCDs Code Status: full code, needs further goals of care with primary oncologist  Family Communication: Son over the phone.  Disposition Plan: remain in the hospital for treatment of PNA, FTT, IF fluids.   Time spent: 75 minutes,  Elmarie Shiley MD [facility]   09/26/2018, 12:48 PM

## 2018-09-26 NOTE — Evaluation (Signed)
Physical Therapy Evaluation Patient Details Name: Carly [name] MRN: [id number] DOB: 1940-07-24 Today's Date: 09/26/2018   History of Present Illness  78 y o female with adenocarcinoma of head of pancreas was admitted 7/29 for observation by IR for stenting of biliary drain.  Found to be tachy and fever of 99.    She has been undergoing chemo.  PMH:   uterine CA, HTN and DM  Clinical Impression  Pt admitted with above diagnosis. Pt currently with functional limitations due to the deficits listed below (see PT Problem List).  Pt will benefit from skilled PT to increase their independence and safety with mobility to allow discharge to the venue listed below.  Pt with some coughing noted with drinking and may benefit from a speech consult.  She has had a significant decline in mobility in the last week and recommend SNF. If family can provide 24/7 care with A level she needs, then HHPT could be an option.     Follow Up Recommendations SNF;Supervision/Assistance - 24 hour    Equipment Recommendations  Rolling walker with 5" wheels;3in1 (PT)    Recommendations for Other Services Speech consult     Precautions / Restrictions Precautions Precautions: Fall Precaution Comments: RUE weak and R wrist painful Restrictions Weight Bearing Restrictions: No      Mobility  Bed Mobility Overal bed mobility: Needs Assistance Bed Mobility: Rolling;Supine to Sit Rolling: Min assist   Supine to sit: Min assist;Mod assist     General bed mobility comments: Difficulty pushing up due to R wrist pain. A for trunk and legs  Transfers Overall transfer level: Needs assistance Equipment used: 2 person hand held assist Transfers: Stand Pivot Transfers;Sit to/from Stand Sit to Stand: Min assist Stand pivot transfers: Min assist;+2 physical assistance       General transfer comment: A to steady  Ambulation/Gait             General Gait Details: deferred due to fatigue and  weakness  Stairs            Wheelchair Mobility    Modified Rankin (Stroke Patients Only)       Balance Overall balance assessment: Needs assistance           Standing balance-Leahy Scale: Poor Standing balance comment: required UE support                             Pertinent Vitals/Pain Pain Assessment: Faces Faces Pain Scale: Hurts whole lot Pain Location: R wrist Pain Descriptors / Indicators: Aching Pain Intervention(s): Limited activity within patient's tolerance;Monitored during session;Repositioned    Home Living Family/patient expects to be discharged to:: Private residence Living Arrangements: Children(for the past week, normally lives alone)   Type of Home: House Home Access: Stairs to enter   Technical brewer of Steps: 4 Home Layout: One level Home Equipment: Other (comment)(lift chair) Additional Comments: pt states she didn't use toilet much:  no BSC.  Has lift chair    Prior Function Level of Independence: Independent         Comments: states that until a week ago, she was independent. Son has been staying with her     Hand Dominance   Dominant Hand: Right    Extremity/Trunk Assessment   Upper Extremity Assessment Upper Extremity Assessment: RUE deficits/detail RUE Deficits / Details: R wrist pain    Lower Extremity Assessment Lower Extremity Assessment: Generalized weakness  Communication   Communication: No difficulties  Cognition Arousal/Alertness: Awake/alert Behavior During Therapy: WFL for tasks assessed/performed Overall Cognitive Status: Within Functional Limits for tasks assessed                                 General Comments: appears WFLs      General Comments General comments (skin integrity, edema, etc.): Pt appeared fatigued, but worked with therapy. Coughing noted after drinking and she states this happens frequently.    Exercises     Assessment/Plan    PT  Assessment Patient needs continued PT services  PT Problem List Decreased strength;Decreased activity tolerance;Decreased balance;Decreased mobility       PT Treatment Interventions DME instruction;Gait training;Therapeutic activities;Therapeutic exercise;Functional mobility training;Balance training;Neuromuscular re-education    PT Goals (Current goals can be found in the Care Plan section)  Acute Rehab PT Goals Patient Stated Goal: none stated; agreeable to working with therapy PT Goal Formulation: With patient Time For Goal Achievement: 10/10/18 Potential to Achieve Goals: Good    Frequency Min 2X/week   Barriers to discharge        Co-evaluation PT/OT/SLP Co-Evaluation/Treatment: Yes Reason for Co-Treatment: For patient/therapist safety PT goals addressed during session: Mobility/safety with mobility         AM-PAC PT "6 Clicks" Mobility  Outcome Measure Help needed turning from your back to your side while in a flat bed without using bedrails?: A Little Help needed moving from lying on your back to sitting on the side of a flat bed without using bedrails?: A Little Help needed moving to and from a bed to a chair (including a wheelchair)?: A Little Help needed standing up from a chair using your arms (e.g., wheelchair or bedside chair)?: A Little Help needed to walk in hospital room?: A Lot Help needed climbing 3-5 steps with a railing? : A Lot 6 Click Score: 16    End of Session Equipment Utilized During Treatment: Oxygen Activity Tolerance: Patient limited by fatigue Patient left: in chair;with call bell/phone within reach;with chair alarm set Nurse Communication: Mobility status PT Visit Diagnosis: Unsteadiness on feet (R26.81);Muscle weakness (generalized) (M62.81);Difficulty in walking, not elsewhere classified (R26.2)    Time: 3007-6226 PT Time Calculation (min) (ACUTE ONLY): 19 min   Charges:   PT Evaluation $PT Eval Low Complexity: 1 Low           Caya Soberanis L. Tamala Julian, [place] Pager [phone] 09/26/2018   Galen Manila 09/26/2018, 3:04 PM

## 2018-09-26 NOTE — Plan of Care (Signed)
Plan of care reviewed and discussed with the patient and her son, Gerald Stabs.

## 2018-09-26 NOTE — Progress Notes (Signed)
Referring Physician(s): Feng,Yan  Supervising Physician: Arne Cleveland  Patient Status:  [facility] - In-pt  Chief Complaint: "I don't feel right"  Subjective:  Biliary obstruction secondary to pancreatic mass s/p biliary drain placement in IR 09/18/2018 by Dr. Laurence Ferrari, s/p conversion to biliary stent in IR 09/25/2018 by Dr. Laurence Ferrari. Patient laying in bed resting. She responds to voice and answers questions appropriately. States "I don't feel right" and "I can't take care of myself at home". Denies dyspnea but tachypnic on exam.   Allergies: Cephalexin and Nabumetone  Medications: Prior to Admission medications   Medication Sig Start Date End Date Taking? Authorizing Provider  ALPRAZolam Duanne Moron) 0.5 MG tablet Take 1 tablet (0.5 mg total) by mouth at bedtime as needed for anxiety. Patient taking differently: Take 0.25 mg by mouth at bedtime as needed for anxiety.  06/17/18  Yes Truitt Merle, MD  atorvastatin (LIPITOR) 10 MG tablet Take 10 mg by mouth at bedtime.    Yes [provider]  CALCIUM PO Take 1 tablet by mouth once a week.    Yes [provider]  Cholecalciferol (VITAMIN D) 2000 units CAPS Take 1 capsule by mouth once a week.    Yes [provider]  cyanocobalamin 2000 MCG tablet Take 2,000 mcg by mouth every evening.   Yes [provider]  diphenoxylate-atropine (LOMOTIL) 2.5-0.025 MG tablet Take 1-2 tablets by mouth 4 (four) times daily as needed for diarrhea or loose stools. 02/04/18  Yes Truitt Merle, MD  hydrochlorothiazide (HYDRODIURIL) 25 MG tablet Take 25 mg by mouth daily as needed (swelling (typically whenever traveling to beach)).  11/29/11  Yes [provider]  HYDROcodone-acetaminophen (NORCO) 5-325 MG tablet Take 1 tablet by mouth every 6 (six) hours as needed for moderate pain. 08/29/17  Yes Truitt Merle, MD  hyoscyamine (LEVBID) 0.375 MG 12 hr tablet Take 0.375 mg by mouth every 12 (twelve) hours as needed for cramping.    Yes  [provider]  levothyroxine (SYNTHROID, LEVOTHROID) 25 MCG tablet Take 25 mcg by mouth daily before breakfast.    Yes [provider]  lidocaine-prilocaine (EMLA) cream Apply 1 application topically as needed. Patient taking differently: Apply 1 application topically as needed (prior to port-a-cath being accessed).  09/02/18  Yes Alla Feeling, NP  loratadine (CLARITIN) 10 MG tablet Take 10 mg by mouth daily.   Yes [provider]  magic mouthwash w/lidocaine SOLN Take 5 mLs by mouth 3 (three) times daily as needed for mouth pain. 09/23/18  Yes Truitt Merle, MD  megestrol (MEGACE ES) 625 MG/5ML suspension Take 5 mLs (625 mg total) by mouth daily. 09/23/18  Yes Truitt Merle, MD  metFORMIN (GLUCOPHAGE) 500 MG tablet TAKE 1 TABLET BY MOUTH EVERY DAY WITH BREAKFAST Patient taking differently: Take 500 mg by mouth daily with breakfast.  09/10/18  Yes Truitt Merle, MD  omeprazole (PRILOSEC) 20 MG capsule Take 20 mg by mouth daily before breakfast.  11/29/11  Yes [provider]  ondansetron (ZOFRAN) 8 MG tablet Take 1 tablet (8 mg total) by mouth every 8 (eight) hours as needed for nausea or vomiting. 09/02/18  Yes Burton, Wilhemina Cash, NP  ONETOUCH DELICA LANCETS 35T MISC USE TO TEST YOUR BLOOD SUGAR ONCE A DAY FASTING AND 2 HRS AFTER A MEAL AS NEEDED 04/26/17  Yes [provider]  ONETOUCH VERIO test strip USE TO TEST YOUR BLOOD SUGAR ONCE A DAY FASTING & 2 HRS AFTER A MEAL AS NEEDED 04/26/17  Yes [provider]  potassium chloride SA (KLOR-CON M20) 20 MEQ tablet Take 1 tablet (20 mEq total) by mouth 2 (two) times daily. Patient taking differently: Take 20 mEq by mouth daily.  05/06/18  Yes Truitt Merle, MD  prochlorperazine (COMPAZINE) 10 MG tablet Take 1 tablet (10 mg total) by mouth every 6 (six) hours as needed for nausea or vomiting. 09/02/18  Yes Alla Feeling, NP  ROCKLATAN 0.02-0.005 % SOLN Place 1 drop into both eyes at bedtime.  11/21/17  Yes [provider]  traMADol (ULTRAM) 50 MG tablet Take 50 mg by mouth every 6 (six) hours as needed (pain).   Yes [provider]  traZODone (DESYREL) 100 MG tablet TAKE 1 TABLET BY MOUTH EVERYDAY AT BEDTIME Patient taking differently: Take 50 mg by mouth at bedtime.  06/19/18  Yes Princess Bruins, MD  eltrombopag (PROMACTA) 50 MG tablet TAKE 2 TABLETS (100 MG) BY MOUTH EVERY DAY ON AN EMPTY STOMACH, 1 HOUR BEFORE OR 2 HOURS AFTER A MEAL Patient not taking: Reported on 09/23/2018 08/26/18   Truitt Merle, MD     Vital Signs: BP 102/65 (BP Location: Right Arm)   Pulse (!) 109   Temp 98.4 F (36.9 C) (Oral)   Resp 17   Ht 5\' 2"  (1.575 m)   Wt 138 lb 14.2 oz (63 kg)   SpO2 98%   BMI 25.40 kg/m   Physical Exam Vitals signs and nursing note reviewed.  Constitutional:      General: She is not in acute distress. Eyes:     General: Scleral icterus present.  Cardiovascular:     Rate and Rhythm: Regular rhythm. Tachycardia present.     Heart sounds: Normal heart sounds. No murmur.  Pulmonary:     Effort: Pulmonary effort is normal. No respiratory distress.     Breath sounds: Normal breath sounds. No wheezing.  Abdominal:     Comments: Biliary stent incision without tenderness, erythema, drainage, or active bleeding; approximately 50 cc dark bile in gravity bag.  Skin:    General: Skin is warm and dry.     Coloration: Skin is jaundiced.  Psychiatric:        Mood and Affect: Mood normal.        Behavior: Behavior normal.        Thought Content: Thought content normal.        Judgment: Judgment normal.     Imaging: Ir Biliary Stent(s) Existing Access Inc Dilation Cath Exchange  Result Date: 09/25/2018 INDICATION: 78 year old female with advanced pancreatic cancer and malignant obstructed jaundice. She underwent percutaneous transhepatic cholangiogram and biliary drain placement last week. She presents today for placement of a palliative covered stent. EXAM: 1. Biliary stent placement. 2.  Biliary drain exchange. MEDICATIONS: 500 mg Flagyl and 500 mg aztreonam; The antibiotic was administered within an appropriate time frame prior to the initiation of the procedure. ANESTHESIA/SEDATION: Moderate (conscious) sedation was employed during this procedure. A total of Versed 1 mg and Fentanyl 50 mcg was administered intravenously. Moderate Sedation Time: 38 minutes. The patient's level of consciousness and vital signs were monitored continuously by radiology nursing throughout the procedure under my direct supervision. FLUOROSCOPY TIME:  Fluoroscopy Time: 9 minutes 48 seconds (160 mGy). COMPLICATIONS: SIR LEVEL B - Normal therapy, includes overnight admission for observation. Patient developed low-grade fever, tachycardia and tachypnea during recovery following the procedure. She was admitted for observation. PROCEDURE: Informed written consent was obtained from the patient after a thorough discussion of the procedural  risks, benefits and alternatives. All questions were addressed. Maximal Sterile Barrier Technique was utilized including caps, mask, sterile gowns, sterile gloves, sterile drape, hand hygiene and skin antiseptic. A timeout was performed prior to the initiation of the procedure. Local anesthesia was attained around the existing drain exit site using 1% lidocaine. The retention suture was cut. The drain was transected and removed over a Bentson wire. An 8 French sheath was then advanced over the wire and into the duodenum. A pull-back, over the wire cholangiogram was then performed through the sheath. 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 The duodenum remains patent with good peristalsis. Biliary ductal dilatation has improved following placement of the internal/external biliary drainage catheter. A 5 French angled catheter was advanced over the wire into the duodenum. The Bentson wire was exchanged for a superstiff Amplatz wire which was advanced  further into the duodenum. The angiographic catheter was removed. The 8 French sheath was removed and exchanged for a 9 French sheath which was advanced into the duodenum. A 10 x 60 wall flex covered stent was advanced through the sheath and across the stenosis. The stent was then successfully deployed across the stenosis. Post dilation was performed using an 8 x 40 mm Conquest balloon. Contrast injection confirms patency of the biliary tree. The distal aspect of the stent is just within the duodenum. A new 10 [place] biliary drain was advanced over the wire and formed with the locking loop in the duodenum. The drain was capped and secured to the skin with 0 Prolene suture. The patient tolerated the procedure well. IMPRESSION: Successful placement of a 10 x 60 wall flex covered stent across the malignant common bile duct stricture. Successful exchange for a new 10 French internal/external biliary drainage catheter. PLAN: 1. Admit for observation. 2. If patient doing well tomorrow, drain to be capped prior to discharge. 3. Patient to return Interventional Radiology in 4-6 weeks for cholangiogram and possible removal of biliary drain if stent is functioning adequately. Signed, Criselda Peaches, MD, [facility] Electronically Signed   By: Jacqulynn Cadet M.D.   On: 09/25/2018 18:51    Labs:  CBC: Recent Labs    09/19/18 0948 09/23/18 0900 09/25/18 1350 09/26/18 0557  WBC 2.9* 8.0 7.8 5.0  HGB 8.6* 9.7* 8.4* 8.3*  HCT 27.3* 29.4* 26.1* 25.8*  PLT 111* 73* 30* 33*    COAGS: Recent Labs    09/18/18 1118 09/25/18 1350  INR 1.4* 1.3*    BMP: Recent Labs    09/19/18 0948 09/23/18 0900 09/25/18 1350 09/26/18 0557  NA 132* 136 135 137  K 3.7 3.9 4.4 4.2  CL 102 101 101 106  CO2 23 25 22 23   GLUCOSE 186* 199* 264* 172*  BUN 14 10 40* 41*  CALCIUM 9.0 10.1 9.1 8.6*  CREATININE 0.91 0.73 1.17* 1.02*  GFRNONAA >60 >60 45*  53*  GFRAA >60 >60 52* >60    LIVER FUNCTION TESTS: Recent Labs    09/19/18 0948 09/23/18 0900 09/25/18 1350 09/26/18 0557  BILITOT 5.4* 5.2* 3.9* 3.6*  AST 127* 121* 81* 63*  ALT 81* 80* 56* 48*  ALKPHOS 605* 691* 356* 313*  PROT 6.2* 6.3* 6.0* 5.5*  ALBUMIN 2.0* 2.0* 2.0* 1.7*    Assessment and Plan:  Biliary obstruction secondary to pancreatic mass s/p biliary drain placement in IR 09/18/2018 by Dr. Laurence Ferrari, s/p conversion to biliary stent in IR 09/25/2018  by Dr. Laurence Ferrari. Discussed case with Dr. Laurence Ferrari- at this time, patient not clinically stable for discharge (tachycardic, tachypnic, states "I don't feel right, I can't take care of myself at home"). Will discuss case with Dr. Burr Medico for further recommendations. IR to follow.   Electronically Signed: Earley Abide, PA-C 09/26/2018, 10:16 AM   I spent a total of 35 Minutes at the the patient's bedside AND on the patient's hospital floor or unit, greater than 50% of which was counseling/coordinating care for biliary obstruction s/p biliary stent placement.

## 2018-09-27 DIAGNOSIS — C250 Malignant neoplasm of head of pancreas: Principal | ICD-10-CM

## 2018-09-27 DIAGNOSIS — R627 Adult failure to thrive: Secondary | ICD-10-CM

## 2018-09-27 DIAGNOSIS — Z515 Encounter for palliative care: Secondary | ICD-10-CM

## 2018-09-27 DIAGNOSIS — I10 Essential (primary) hypertension: Secondary | ICD-10-CM

## 2018-09-27 DIAGNOSIS — Z7189 Other specified counseling: Secondary | ICD-10-CM

## 2018-09-27 LAB — CBC
HCT: 25.1 % — ABNORMAL LOW (ref 36.0–46.0)
Hemoglobin: 8 g/dL — ABNORMAL LOW (ref 12.0–15.0)
MCH: 31.3 pg (ref 26.0–34.0)
MCHC: 31.9 g/dL (ref 30.0–36.0)
MCV: 98 fL (ref 80.0–100.0)
Platelets: 33 10*3/uL — ABNORMAL LOW (ref 150–400)
RBC: 2.56 MIL/uL — ABNORMAL LOW (ref 3.87–5.11)
RDW: 18.9 % — ABNORMAL HIGH (ref 11.5–15.5)
WBC: 4.1 10*3/uL (ref 4.0–10.5)
nRBC: 0 % (ref 0.0–0.2)

## 2018-09-27 LAB — COMPREHENSIVE METABOLIC PANEL
ALT: 37 U/L (ref 0–44)
AST: 43 U/L — ABNORMAL HIGH (ref 15–41)
Albumin: 1.6 g/dL — ABNORMAL LOW (ref 3.5–5.0)
Alkaline Phosphatase: 220 U/L — ABNORMAL HIGH (ref 38–126)
Anion gap: 6 (ref 5–15)
BUN: 39 mg/dL — ABNORMAL HIGH (ref 8–23)
CO2: 23 mmol/L (ref 22–32)
Calcium: 8.5 mg/dL — ABNORMAL LOW (ref 8.9–10.3)
Chloride: 110 mmol/L (ref 98–111)
Creatinine, Ser: 0.88 mg/dL (ref 0.44–1.00)
GFR calc Af Amer: >60 mL/min (ref >60)
GFR calc non Af Amer: >60 mL/min (ref >60)
Glucose, Bld: 119 mg/dL — ABNORMAL HIGH (ref 70–99)
Potassium: 4.1 mmol/L (ref 3.5–5.1)
Sodium: 139 mmol/L (ref 135–145)
Total Bilirubin: 4 mg/dL — ABNORMAL HIGH (ref 0.3–1.2)
Total Protein: 5.2 g/dL — ABNORMAL LOW (ref 6.5–8.1)

## 2018-09-27 LAB — GLUCOSE, CAPILLARY
Glucose-Capillary: 110 mg/dL — ABNORMAL HIGH (ref 70–99)
Glucose-Capillary: 127 mg/dL — ABNORMAL HIGH (ref 70–99)
Glucose-Capillary: 130 mg/dL — ABNORMAL HIGH (ref 70–99)
Glucose-Capillary: 150 mg/dL — ABNORMAL HIGH (ref 70–99)
Glucose-Capillary: 152 mg/dL — ABNORMAL HIGH (ref 70–99)
Glucose-Capillary: 153 mg/dL — ABNORMAL HIGH (ref 70–99)
Glucose-Capillary: 182 mg/dL — ABNORMAL HIGH (ref 70–99)

## 2018-09-27 LAB — T4, FREE: Free T4: 0.8 ng/dL (ref 0.61–1.12)

## 2018-09-27 NOTE — Progress Notes (Signed)
PROGRESS NOTE    Carly [name]  PFX:[id number] DOB: 1941-02-24 DOA: 09/25/2018 PCP: [name], MD    Brief Narrative:   Carly [name] is a 78 y.o. 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 Burr Medico, recent  chemo on Monday (Abraxane) who was admitted on 09/25/2018 by IR for internalization/stenting of biliary Drain.    Patient was admitted overnight for observation, with  Iv fluids, IV antibiotics, post procedure patient was noticed to be tachycardic and tachypnea, tempeture at 99. Patient was evaluated the morning of 7/30  by Dr. Laurence Ferrari, and patient was not considered to be stable to be discharged.  Patient continue to be tachycardic, mild tachypneic and with generalized weakness. She was admitted to [facility] service for further evaluation. Oncology consulted and recommendations given.    Assessment & Plan:   Active Problems:   Hypothyroidism   Malignant neoplasm of pancreas (HCC)   Malignant obstructive jaundice (HCC)   Hypertension   Thrombocytopenia (HCC)   FTT (failure to thrive) in adult   PNA (pneumonia)  Malignant obstructive Jaundice secondary to  adenocarcinoma of the pancreas: - continue with aztreonam.for now.  Appreciate oncology and IR recommendations.  Pt continues to be jaundiced, ill appearing and deteriorating. The bilirubin continues to rise.   Recommend palliative care consult for goals of care and possible transition to hospice.     Health care associated pneumonia:  Resume aztreonam.    Thrombocytopenia:  Resume promacta for now.     Hypotension:  Resolved.    Anemia of chronic disease Hemoglobin stable around 8.    Tachycardia:  Possibly from the disease process.      DVT prophylaxis: scd's Code Status: full code.  Family Communication: none at bedside.  Disposition Plan: pending palliative care input.   Consultants:   Oncology   IR    Procedures: none.   Antimicrobials:  none.   Subjective: Ill appearing. And wants to talk about hospice services.   Objective: Vitals:   09/26/18 1900 09/26/18 1955 09/26/18 2207 09/27/18 0526  BP:  (!) 108/57 (!) 101/54 104/60  Pulse: (!) 116 (!) 124 74 (!) 115  Resp:  16 18 16   Temp:  99 F (37.2 C) 98.6 F (37 C) 98.6 F (37 C)  TempSrc:  Oral Oral Oral  SpO2:  98% 93% 92%  Weight:      Height:        Intake/Output Summary (Last 24 hours) at 09/27/2018 1132 Last data filed at 09/27/2018 0951 Gross per 24 hour  Intake 1311.41 ml  Output 990 ml  Net 321.41 ml   Filed Weights   09/25/18 2050  Weight: 63 kg    Examination:  General exam: cachetic, ill appearing.  Respiratory system: diminished air entry at bases.  Cardiovascular system: S1 & S2 heard, tachycardic.  Gastrointestinal system: Abdomen is nondistended, soft and nontender. No organomegaly or masses felt. Normal bowel sounds heard. Central nervous system: Alert and oriented. Non focal.  Extremities: no pedal edema.  Skin: No rashes, lesions or ulcers Psychiatry: Mood & affect appropriate.     Data Reviewed: I have personally reviewed following labs and imaging studies  CBC: Recent Labs  Lab 09/23/18 0900 09/25/18 1350 09/26/18 0557 09/27/18 0350  WBC 8.0 7.8 5.0 4.1  NEUTROABS 6.0 7.3  --   --   HGB 9.7* 8.4* 8.3* 8.0*  HCT 29.4* 26.1* 25.8* 25.1*  MCV 95.8 98.1 98.5 98.0  PLT 73* 30* 33*  33*   Basic Metabolic Panel: Recent Labs  Lab 09/23/18 0900 09/25/18 1350 09/26/18 0557 09/27/18 0350  NA 136 135 137 139  K 3.9 4.4 4.2 4.1  CL 101 101 106 110  CO2 25 22 23 23   GLUCOSE 199* 264* 172* 119*  BUN 10 40* 41* 39*  CREATININE 0.73 1.17* 1.02* 0.88  CALCIUM 10.1 9.1 8.6* 8.5*   GFR: Estimated Creatinine Clearance: 46 mL/min (by C-G formula based on SCr of 0.88 mg/dL). Liver Function Tests: Recent Labs  Lab 09/23/18 0900 09/25/18 1350 09/26/18 0557 09/27/18 0350  AST 121* 81* 63* 43*  ALT 80* 56* 48* 37  ALKPHOS  691* 356* 313* 220*  BILITOT 5.2* 3.9* 3.6* 4.0*  PROT 6.3* 6.0* 5.5* 5.2*  ALBUMIN 2.0* 2.0* 1.7* 1.6*   No results for input(s): LIPASE, AMYLASE in the last 168 hours. Recent Labs  Lab 09/26/18 1421  AMMONIA 40*   Coagulation Profile: Recent Labs  Lab 09/25/18 1350  INR 1.3*   Cardiac Enzymes: No results for input(s): CKTOTAL, CKMB, CKMBINDEX, TROPONINI in the last 168 hours. BNP (last 3 results) No results for input(s): PROBNP in the last 8760 hours. HbA1C: No results for input(s): HGBA1C in the last 72 hours. CBG: Recent Labs  Lab 09/26/18 1947 09/27/18 0026 09/27/18 0355 09/27/18 0722 09/27/18 1121  GLUCAP 154* 130* 127* 110* 153*   Lipid Profile: No results for input(s): CHOL, HDL, LDLCALC, TRIG, CHOLHDL, LDLDIRECT in the last 72 hours. Thyroid Function Tests: Recent Labs    09/26/18 1340 09/27/18 0350  TSH 7.199*  --   FREET4  --  0.80   Anemia Panel: No results for input(s): VITAMINB12, FOLATE, FERRITIN, TIBC, IRON, RETICCTPCT in the last 72 hours. Sepsis Labs: No results for input(s): PROCALCITON, LATICACIDVEN in the last 168 hours.  Recent Results (from the past 240 hour(s))  SARS Coronavirus 2 (CEPHEID - Performed in [place] hospital lab), Hosp Order     Status: None   Collection Time: 09/18/18 11:21 AM   Specimen: Nasopharyngeal Swab  Result Value Ref Range Status   SARS Coronavirus 2 NEGATIVE NEGATIVE Final    Comment: (NOTE) If result is NEGATIVE SARS-CoV-2 target nucleic acids are NOT DETECTED. The SARS-CoV-2 RNA is generally detectable in upper and lower  respiratory specimens during the acute phase of infection. The lowest  concentration of SARS-CoV-2 viral copies this assay can detect is 250  copies / mL. A negative result does not preclude SARS-CoV-2 infection  and should not be used as the sole basis for treatment or other  patient management decisions.  A negative result may occur with  improper specimen collection / handling,  submission of specimen other  than nasopharyngeal swab, presence of viral mutation(s) within the  areas targeted by this assay, and inadequate number of viral copies  (<250 copies / mL). A negative result must be combined with clinical  observations, patient history, and epidemiological information. If result is POSITIVE SARS-CoV-2 target nucleic acids are DETECTED. The SARS-CoV-2 RNA is generally detectable in upper and lower  respiratory specimens dur ing the acute phase of infection.  Positive  results are indicative of active infection with SARS-CoV-2.  Clinical  correlation with patient history and other diagnostic information is  necessary to determine patient infection status.  Positive results do  not rule out bacterial infection or co-infection with other viruses. If result is PRESUMPTIVE POSTIVE SARS-CoV-2 nucleic acids MAY BE PRESENT.   A presumptive positive result was obtained on the submitted specimen  and confirmed on repeat testing.  While 2019 novel coronavirus  (SARS-CoV-2) nucleic acids may be present in the submitted sample  additional confirmatory testing may be necessary for epidemiological  and / or clinical management purposes  to differentiate between  SARS-CoV-2 and other Sarbecovirus currently known to infect humans.  If clinically indicated additional testing with an alternate test  methodology [phone]) is advised. The SARS-CoV-2 RNA is generally  detectable in upper and lower respiratory sp ecimens during the acute  phase of infection. The expected result is Negative. Fact Sheet for Patients:  StrictlyIdeas.no Fact Sheet for Healthcare Providers: BankingDealers.co.za This test is not yet approved or cleared by the [place] FDA and has been authorized for detection and/or diagnosis of SARS-CoV-2 by FDA under an Emergency Use Authorization (EUA).  This EUA will remain in effect (meaning this test can be  used) for the duration of the COVID-19 declaration under Section 564(b)(1) of the Act, 21 U.S.C. section 360bbb-3(b)(1), unless the authorization is terminated or revoked sooner. Performed at [facility], [place] [address] [place], [place] [address]   Urine Culture     Status: None (Preliminary result)   Collection Time: 09/26/18  1:22 PM   Specimen: Urine, Clean Catch  Result Value Ref Range Status   Specimen Description   Final    URINE, CLEAN CATCH Performed at [facility], [place] [address], [place], [place] [address]    Special Requests   Final    NONE Performed at [facility], [place] [address], [place], [place] [address]    Culture   Final    CULTURE REINCUBATED FOR BETTER GROWTH Performed at [facility], [place] [address], [place], [place] [address]    Report Status PENDING  Incomplete         Radiology Studies: Dg Chest Port 1 View  Result Date: 09/26/2018 CLINICAL DATA:  Shortness of breath EXAM: PORTABLE CHEST 1 VIEW COMPARISON:  Chest radiograph 08/02/2010 FINDINGS: Right anterior chest wall Port-A-Cath is present with tip projecting over the superior vena cava. Enlarged cardiac and mediastinal contours. Aortic atherosclerosis. Mild left pleural effusion with underlying consolidation. No pneumothorax. IMPRESSION: Small left pleural effusion with underlying consolidation left lower hemithorax. Electronically Signed   By: Lovey Newcomer M.D.   On: 09/26/2018 12:25   Ir Biliary Stent(s) Existing Access Inc Dilation Cath Exchange  Result Date: 09/25/2018 INDICATION: 78 year old female with advanced pancreatic cancer and malignant obstructed jaundice. She underwent percutaneous transhepatic cholangiogram and biliary drain placement last week. She presents today for placement of a palliative covered stent. EXAM: 1. Biliary stent placement. 2. Biliary drain exchange. MEDICATIONS: 500 mg Flagyl and 500 mg aztreonam; The  antibiotic was administered within an appropriate time frame prior to the initiation of the procedure. ANESTHESIA/SEDATION: Moderate (conscious) sedation was employed during this procedure. A total of Versed 1 mg and Fentanyl 50 mcg was administered intravenously. Moderate Sedation Time: 38 minutes. The patient's level of consciousness and vital signs were monitored continuously by radiology nursing throughout the procedure under my direct supervision. FLUOROSCOPY TIME:  Fluoroscopy Time: 9 minutes 48 seconds (160 mGy). COMPLICATIONS: SIR LEVEL B - Normal therapy, includes overnight admission for observation. Patient developed low-grade fever, tachycardia and tachypnea during recovery following the procedure. She was admitted for observation. PROCEDURE: Informed written consent was obtained from the patient after a thorough discussion of the procedural risks, benefits and alternatives. All questions were addressed. Maximal Sterile Barrier Technique was utilized including caps, mask,  sterile gowns, sterile gloves, sterile drape, hand hygiene and skin antiseptic. A timeout was performed prior to the initiation of the procedure. Local anesthesia was attained around the existing drain exit site using 1% lidocaine. The retention suture was cut. The drain was transected and removed over a Bentson wire. An 8 French sheath was then advanced over the wire and into the duodenum. A pull-back, over the wire cholangiogram was then performed through the sheath. 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 The duodenum remains patent with good peristalsis. Biliary ductal dilatation has improved following placement of the internal/external biliary drainage catheter. A 5 French angled catheter was advanced over the wire into the duodenum. The Bentson wire was exchanged for a superstiff Amplatz wire which was advanced further into the duodenum. The angiographic catheter was removed. The 8 French  sheath was removed and exchanged for a 9 French sheath which was advanced into the duodenum. A 10 x 60 wall flex covered stent was advanced through the sheath and across the stenosis. The stent was then successfully deployed across the stenosis. Post dilation was performed using an 8 x 40 mm Conquest balloon. Contrast injection confirms patency of the biliary tree. The distal aspect of the stent is just within the duodenum. A new 10 [place] biliary drain was advanced over the wire and formed with the locking loop in the duodenum. The drain was capped and secured to the skin with 0 Prolene suture. The patient tolerated the procedure well. IMPRESSION: Successful placement of a 10 x 60 wall flex covered stent across the malignant common bile duct stricture. Successful exchange for a new 10 French internal/external biliary drainage catheter. PLAN: 1. Admit for observation. 2. If patient doing well tomorrow, drain to be capped prior to discharge. 3. Patient to return Interventional Radiology in 4-6 weeks for cholangiogram and possible removal of biliary drain if stent is functioning adequately. Signed, Criselda Peaches, MD, [facility] Electronically Signed   By: Jacqulynn Cadet M.D.   On: 09/25/2018 18:51        Scheduled Meds: . atorvastatin  10 mg Oral QHS  . [START ON 10/01/2018] cholecalciferol  2,000 Units Oral Weekly  . eltrombopag  100 mg Oral Daily  . feeding supplement (ENSURE ENLIVE)  237 mL Oral BID BM  . heparin lock flush  500 Units Intravenous Once  . insulin aspart  0-9 Units Subcutaneous Q4H  . levothyroxine  25 mcg Oral Q0600  . megestrol  800 mg Oral Daily  . Netarsudil-Latanoprost  1 drop Both Eyes QHS  . nystatin  5 mL Oral QID  . pantoprazole  40 mg Oral Daily  . potassium chloride SA  20 mEq Oral Daily   Continuous Infusions: . sodium chloride 75 mL/hr at 09/26/18 1201  . aztreonam 1 g (09/27/18 0528)  .  metronidazole 500 mg (09/27/18 0124)     LOS: 1 day    Time spent: 35 minutes.     Hosie Poisson, MD [facility] Pager [phone]  If 7PM-7AM, please contact night-coverage [email or web address] Password [facility] 09/27/2018, 11:32 AM

## 2018-09-27 NOTE — Plan of Care (Signed)
Patient lying in bed this morning. Alert and responsive; answers questions appropriately. No needs expressed at this time. Will continue to monitor.

## 2018-09-27 NOTE — Consult Note (Signed)
Consultation Note Date: 09/27/2018   Patient Name: Carly [name]  DOB: 1940/12/06  MRN: [id number]  Age / Sex: 78 y.o., female  PCP: [name], MD Referring Physician: Hosie Poisson, MD  Reason for Consultation: Establishing goals of care  HPI/Patient Profile: 78 y.o. female  with past medical history of metastatic pancreatic cancer admitted on 09/25/2018 with malignant obstructive jaundice, tachycardia and concern for pneumonia.  Dr. Burr Medico has seen patient and does not feel that patient is a candidate for more chemotherapy.  Palliative consulted for goals of care..   Clinical Assessment and Goals of Care: I met today with Ms. Lapier.   I introduced palliative care as specialized medical care for people living with serious illness. It focuses on providing relief from the symptoms and stress of a serious illness. The goal is to improve quality of life for both the patient and the family.  She reports discussing with Dr. Burr Medico and understanding that her condition has continued to worsen.  We briefly reviewed her clinical condition and hospital course this admission.  She states she is having trouble completely understanding options and pathways forward and would like for her sons to be present to help her make decisions.  She does mention that hospice has been noted to be an option moving forward.  Dr. Karleen Hampshire has called family already and her son is coming in from [place].  Patient is not really able to make decisions on her own and plan for a follow-up family meeting tomorrow.  I did have very gentle overview of plans for conversation tomorrow.  We also discussed that in light of her incurable cancer, care should be focused on interventions that are likely to allow her to achieve goal of getting back to home and spending time with family.  She is noted to be DNR in notes in the chart but this was not clearly  listed under orders for her care.  I reviewed this with her today she confirms that she is in fact DNR in the event of cardiac or respiratory arrest.  Questions and concerns addressed.   PMT will continue to support holistically.   SUMMARY OF RECOMMENDATIONS   - DNR/DNI -Had initial conversations with patient regarding goals of care moving forward.  Plan for follow-up meeting tomorrow at 10 AM when her sons can be present as well.  Code Status/Advance Care Planning:  DNR  Psycho-social/Spiritual:   Desire for further Chaplaincy support:no  Additional Recommendations: Education on Hospice  Prognosis:   < 6 months  Discharge Planning: To Be Determined      Primary Diagnoses: Present on Admission: . Malignant obstructive jaundice ([place]) . Hypothyroidism   I have reviewed the medical record, interviewed the patient and family, and examined the patient. The following aspects are pertinent.  Past Medical History:  Diagnosis Date  . Allergic rhinitis    Skin Test 04-14-2009  . Asthma   . Cancer ([place]) 1981   adenocarcinoma of uterus  . Cancer ([place]) 2019   PANCREATIC   .  Diabetes mellitus without complication ([place])    type II, no meds per pt  . Family history of breast cancer   . History of uterine cancer   . Hyperlipemia   . Hypertension   . Insomnia    Social History   Socioeconomic History  . Marital status: Widowed    Spouse name: Not on file  . Number of children: Not on file  . Years of education: Not on file  . Highest education level: Not on file  Occupational History  . Not on file  Social Needs  . Financial resource strain: Not on file  . Food insecurity    Worry: Not on file    Inability: Not on file  . Transportation needs    Medical: Not on file    Non-medical: Not on file  Tobacco Use  . Smoking status: Former Smoker    Packs/day: 0.50    Years: 15.00    Pack years: 7.50    Types: Cigarettes    Quit date: 02/27/1978    Years since  quitting: 40.6  . Smokeless tobacco: Never Used  Substance and Sexual Activity  . Alcohol use: Yes    Alcohol/week: 0.0 standard drinks    Comment: occ- glass of wine  . Drug use: No  . Sexual activity: Yes    Comment: 1st intercouse- 15, partners- 1  Lifestyle  . Physical activity    Days per week: Not on file    Minutes per session: Not on file  . Stress: Not on file  Relationships  . Social Herbalist on phone: Not on file    Gets together: Not on file    Attends religious service: Not on file    Active member of club or organization: Not on file    Attends meetings of clubs or organizations: Not on file    Relationship status: Not on file  Other Topics Concern  . Not on file  Social History Narrative  . Not on file   Family History  Problem Relation Age of Onset  . Stroke Mother 11  . Heart attack Father 48  . Dementia Brother   . Hypertension Brother   . Skin cancer Brother   . Breast cancer Maternal Grandmother        dx >50  . Breast cancer Cousin        mat first cousin, dx in her 20s  . Cancer Cousin        oral cancer  . Breast cancer Cousin        mat first cousin, dx in her 15s  . Heart disease Maternal Uncle   . Other Maternal Grandfather        suicide  . Heart disease Paternal Grandmother    Scheduled Meds: . atorvastatin  10 mg Oral QHS  . [START ON 10/01/2018] cholecalciferol  2,000 Units Oral Weekly  . eltrombopag  100 mg Oral Daily  . feeding supplement (ENSURE ENLIVE)  237 mL Oral BID BM  . insulin aspart  0-9 Units Subcutaneous Q4H  . levothyroxine  25 mcg Oral Q0600  . megestrol  800 mg Oral Daily  . Netarsudil-Latanoprost  1 drop Both Eyes QHS  . nystatin  5 mL Oral QID  . pantoprazole  40 mg Oral Daily  . potassium chloride SA  20 mEq Oral Daily   Continuous Infusions: . sodium chloride 75 mL/hr at 09/26/18 1201  . aztreonam 1 g (09/27/18 1555)  .  metronidazole Stopped (09/27/18 1234)   PRN Meds:.acetaminophen,  ALPRAZolam, diphenoxylate-atropine, HYDROcodone-acetaminophen, hyoscyamine, magic mouthwash w/lidocaine, ondansetron, prochlorperazine, sodium chloride flush, traMADol, traZODone Medications Prior to Admission:  Prior to Admission medications   Medication Sig Start Date End Date Taking? Authorizing Provider  ALPRAZolam Duanne Moron) 0.5 MG tablet Take 1 tablet (0.5 mg total) by mouth at bedtime as needed for anxiety. Patient taking differently: Take 0.25 mg by mouth at bedtime as needed for anxiety.  06/17/18  Yes Truitt Merle, MD  atorvastatin (LIPITOR) 10 MG tablet Take 10 mg by mouth at bedtime.    Yes [provider]  CALCIUM PO Take 1 tablet by mouth once a week.    Yes [provider]  Cholecalciferol (VITAMIN D) 2000 units CAPS Take 1 capsule by mouth once a week.    Yes [provider]  cyanocobalamin 2000 MCG tablet Take 2,000 mcg by mouth every evening.   Yes [provider]  diphenoxylate-atropine (LOMOTIL) 2.5-0.025 MG tablet Take 1-2 tablets by mouth 4 (four) times daily as needed for diarrhea or loose stools. 02/04/18  Yes Truitt Merle, MD  hydrochlorothiazide (HYDRODIURIL) 25 MG tablet Take 25 mg by mouth daily as needed (swelling (typically whenever traveling to beach)).  11/29/11  Yes [provider]  HYDROcodone-acetaminophen (NORCO) 5-325 MG tablet Take 1 tablet by mouth every 6 (six) hours as needed for moderate pain. 08/29/17  Yes Truitt Merle, MD  hyoscyamine (LEVBID) 0.375 MG 12 hr tablet Take 0.375 mg by mouth every 12 (twelve) hours as needed for cramping.    Yes [provider]  levothyroxine (SYNTHROID, LEVOTHROID) 25 MCG tablet Take 25 mcg by mouth daily before breakfast.    Yes [provider]  lidocaine-prilocaine (EMLA) cream Apply 1 application topically as needed. Patient taking differently: Apply 1 application topically as needed (prior to port-a-cath being accessed).  09/02/18  Yes Alla Feeling, NP  loratadine (CLARITIN)  10 MG tablet Take 10 mg by mouth daily.   Yes [provider]  magic mouthwash w/lidocaine SOLN Take 5 mLs by mouth 3 (three) times daily as needed for mouth pain. 09/23/18  Yes Truitt Merle, MD  megestrol (MEGACE ES) 625 MG/5ML suspension Take 5 mLs (625 mg total) by mouth daily. 09/23/18  Yes Truitt Merle, MD  metFORMIN (GLUCOPHAGE) 500 MG tablet TAKE 1 TABLET BY MOUTH EVERY DAY WITH BREAKFAST Patient taking differently: Take 500 mg by mouth daily with breakfast.  09/10/18  Yes Truitt Merle, MD  omeprazole (PRILOSEC) 20 MG capsule Take 20 mg by mouth daily before breakfast.  11/29/11  Yes [provider]  ondansetron (ZOFRAN) 8 MG tablet Take 1 tablet (8 mg total) by mouth every 8 (eight) hours as needed for nausea or vomiting. 09/02/18  Yes Burton, Wilhemina Cash, NP  ONETOUCH DELICA LANCETS 05W MISC USE TO TEST YOUR BLOOD SUGAR ONCE A DAY FASTING AND 2 HRS AFTER A MEAL AS NEEDED 04/26/17  Yes [provider]  ONETOUCH VERIO test strip USE TO TEST YOUR BLOOD SUGAR ONCE A DAY FASTING & 2 HRS AFTER A MEAL AS NEEDED 04/26/17  Yes [provider]  potassium chloride SA (KLOR-CON M20) 20 MEQ tablet Take 1 tablet (20 mEq total) by mouth 2 (two) times daily. Patient taking differently: Take 20 mEq by mouth daily.  05/06/18  Yes Truitt Merle, MD  prochlorperazine (COMPAZINE) 10 MG tablet Take 1 tablet (10 mg total) by mouth every 6 (six) hours as needed for nausea or vomiting. 09/02/18  Yes Kalman Shan,  Wilhemina Cash, NP  ROCKLATAN 0.02-0.005 % SOLN Place 1 drop into both eyes at bedtime.  11/21/17  Yes [provider]  traMADol (ULTRAM) 50 MG tablet Take 50 mg by mouth every 6 (six) hours as needed (pain).   Yes [provider]  traZODone (DESYREL) 100 MG tablet TAKE 1 TABLET BY MOUTH EVERYDAY AT BEDTIME Patient taking differently: Take 50 mg by mouth at bedtime.  06/19/18  Yes Princess Bruins, MD  eltrombopag (PROMACTA) 50 MG tablet TAKE 2 TABLETS (100 MG) BY MOUTH EVERY DAY ON AN EMPTY  STOMACH, 1 HOUR BEFORE OR 2 HOURS AFTER A MEAL Patient not taking: Reported on 09/23/2018 08/26/18   Truitt Merle, MD   Allergies  Allergen Reactions  . Cephalexin Hives and Swelling  . Nabumetone Hives and Swelling   Review of Systems  Constitutional: Positive for activity change, appetite change and fatigue.  Neurological: Positive for weakness.  Psychiatric/Behavioral: Positive for sleep disturbance.   Physical Exam  General: Alert, awake, in no acute distress. Frail and chronically ill-appearing. Heart: Tachycardic. No murmur appreciated. Lungs: Decreased air movement, clear Abdomen: Soft, nontender, nondistended, positive bowel sounds.  Ext: No significant edema Skin: Warm and dry Neuro: Grossly intact, nonfocal.   Vital Signs: BP 122/73 (BP Location: Left Arm)   Pulse (!) 112   Temp 98.4 F (36.9 C) (Oral)   Resp 16   Ht '5\' 2"'  (1.575 m)   Wt 63 kg   SpO2 96%   BMI 25.40 kg/m  Pain Scale: 0-10   Pain Score: 0-No pain   SpO2: SpO2: 96 % O2 Device:SpO2: 96 % O2 Flow Rate: .O2 Flow Rate (L/min): 2 L/min  IO: Intake/output summary:   Intake/Output Summary (Last 24 hours) at 09/27/2018 1829 Last data filed at 09/27/2018 1719 Gross per 24 hour  Intake 1258.47 ml  Output 935 ml  Net 323.47 ml    LBM: Last BM Date: 09/24/18 Baseline Weight: Weight: 63 kg Most recent weight: Weight: 63 kg     Palliative Assessment/Data:   Flowsheet Rows     Most Recent Value  Intake Tab  Referral Department  Oncology  Unit at Time of Referral  Med/Surg Unit  Palliative Care Primary Diagnosis  Cancer  Date Notified  09/26/18  Palliative Care Type  New Palliative care  Reason for referral  Clarify Goals of Care  Date of Admission  09/25/18  Date first seen by Palliative Care  09/27/18  # of days Palliative referral response time  1 Day(s)  # of days IP prior to Palliative referral  1  Clinical Assessment  Palliative Performance Scale Score  30%  Psychosocial & Spiritual  Assessment  Palliative Care Outcomes  Patient/Family meeting held?  Yes  Who was at the meeting?  Patient  Palliative Care Outcomes  Changed CPR status      Time In: 1600 Time Out: 1650 Time Total: 50 Greater than 50%  of this time was spent counseling and coordinating care related to the above assessment and plan.  Signed by: Micheline Rough, MD   Please contact Palliative Medicine Team phone at [phone] for questions and concerns.  For individual provider: See Shea Evans

## 2018-09-27 NOTE — Progress Notes (Signed)
Referring Physician(s): Feng,Y  Supervising Physician: Markus Daft  Patient Status:  [facility] - In-pt  Chief Complaint: Weakness, diminished appetite, right wrist pain, mild dyspnea   Subjective: Patient continues to feel and appear weak.  She has some dyspnea with exertion, poor appetite, as well as right wrist pain; denies worsening abdominal pain, nausea, vomiting, fever or chills.   Allergies: Cephalexin and Nabumetone  Medications: Prior to Admission medications   Medication Sig Start Date End Date Taking? Authorizing Provider  ALPRAZolam Duanne Moron) 0.5 MG tablet Take 1 tablet (0.5 mg total) by mouth at bedtime as needed for anxiety. Patient taking differently: Take 0.25 mg by mouth at bedtime as needed for anxiety.  06/17/18  Yes Truitt Merle, MD  atorvastatin (LIPITOR) 10 MG tablet Take 10 mg by mouth at bedtime.    Yes [provider]  CALCIUM PO Take 1 tablet by mouth once a week.    Yes [provider]  Cholecalciferol (VITAMIN D) 2000 units CAPS Take 1 capsule by mouth once a week.    Yes [provider]  cyanocobalamin 2000 MCG tablet Take 2,000 mcg by mouth every evening.   Yes [provider]  diphenoxylate-atropine (LOMOTIL) 2.5-0.025 MG tablet Take 1-2 tablets by mouth 4 (four) times daily as needed for diarrhea or loose stools. 02/04/18  Yes Truitt Merle, MD  hydrochlorothiazide (HYDRODIURIL) 25 MG tablet Take 25 mg by mouth daily as needed (swelling (typically whenever traveling to beach)).  11/29/11  Yes [provider]  HYDROcodone-acetaminophen (NORCO) 5-325 MG tablet Take 1 tablet by mouth every 6 (six) hours as needed for moderate pain. 08/29/17  Yes Truitt Merle, MD  hyoscyamine (LEVBID) 0.375 MG 12 hr tablet Take 0.375 mg by mouth every 12 (twelve) hours as needed for cramping.    Yes [provider]  levothyroxine (SYNTHROID, LEVOTHROID) 25 MCG tablet Take 25 mcg by mouth daily before breakfast.    Yes [provider]  lidocaine-prilocaine (EMLA) cream Apply 1 application topically as needed. Patient taking differently: Apply 1 application topically as needed (prior to port-a-cath being accessed).  09/02/18  Yes Alla Feeling, NP  loratadine (CLARITIN) 10 MG tablet Take 10 mg by mouth daily.   Yes [provider]  magic mouthwash w/lidocaine SOLN Take 5 mLs by mouth 3 (three) times daily as needed for mouth pain. 09/23/18  Yes Truitt Merle, MD  megestrol (MEGACE ES) 625 MG/5ML suspension Take 5 mLs (625 mg total) by mouth daily. 09/23/18  Yes Truitt Merle, MD  metFORMIN (GLUCOPHAGE) 500 MG tablet TAKE 1 TABLET BY MOUTH EVERY DAY WITH BREAKFAST Patient taking differently: Take 500 mg by mouth daily with breakfast.  09/10/18  Yes Truitt Merle, MD  omeprazole (PRILOSEC) 20 MG capsule Take 20 mg by mouth daily before breakfast.  11/29/11  Yes [provider]  ondansetron (ZOFRAN) 8 MG tablet Take 1 tablet (8 mg total) by mouth every 8 (eight) hours as needed for nausea or vomiting. 09/02/18  Yes Burton, Wilhemina Cash, NP  ONETOUCH DELICA LANCETS 29J MISC USE TO TEST YOUR BLOOD SUGAR ONCE A DAY FASTING AND 2 HRS AFTER A MEAL AS NEEDED 04/26/17  Yes [provider]  ONETOUCH VERIO test strip USE TO TEST YOUR BLOOD SUGAR ONCE A DAY FASTING & 2 HRS AFTER A MEAL AS NEEDED 04/26/17  Yes [provider]  potassium chloride SA (KLOR-CON M20) 20 MEQ tablet Take 1 tablet (20 mEq total) by mouth 2 (two) times daily. Patient taking  differently: Take 20 mEq by mouth daily.  05/06/18  Yes Truitt Merle, MD  prochlorperazine (COMPAZINE) 10 MG tablet Take 1 tablet (10 mg total) by mouth every 6 (six) hours as needed for nausea or vomiting. 09/02/18  Yes Alla Feeling, NP  ROCKLATAN 0.02-0.005 % SOLN Place 1 drop into both eyes at bedtime.  11/21/17  Yes [provider]  traMADol (ULTRAM) 50 MG tablet Take 50 mg by mouth every 6 (six) hours as needed (pain).   Yes [provider]  traZODone (DESYREL) 100 MG  tablet TAKE 1 TABLET BY MOUTH EVERYDAY AT BEDTIME Patient taking differently: Take 50 mg by mouth at bedtime.  06/19/18  Yes Princess Bruins, MD  eltrombopag (PROMACTA) 50 MG tablet TAKE 2 TABLETS (100 MG) BY MOUTH EVERY DAY ON AN EMPTY STOMACH, 1 HOUR BEFORE OR 2 HOURS AFTER A MEAL Patient not taking: Reported on 09/23/2018 08/26/18   Truitt Merle, MD     Vital Signs: BP 122/73 (BP Location: Left Arm)   Pulse (!) 112   Temp 98.4 F (36.9 C) (Oral)   Resp 16   Ht _0  (1.575 m)   Wt 138 lb 14.2 oz (63 kg)   SpO2 96%   BMI 25.40 kg/m   Physical Exam patient awake but somewhat lethargic, jaundiced; right upper quadrant biliary drain intact, insertion site okay, not significantly tender, drain output 120 cc yesterday, 50 cc today of green bile.  Drain flushed without difficulty.  Right wrist with some fluctuance around right radial region, tender to palpation, intact pulses, no bruising or hand paresthesias  Imaging: Dg Chest Port 1 View  Result Date: 09/26/2018 CLINICAL DATA:  Shortness of breath EXAM: PORTABLE CHEST 1 VIEW COMPARISON:  Chest radiograph 08/02/2010 FINDINGS: Right anterior chest wall Port-A-Cath is present with tip projecting over the superior vena cava. Enlarged cardiac and mediastinal contours. Aortic atherosclerosis. Mild left pleural effusion with underlying consolidation. No pneumothorax. IMPRESSION: Small left pleural effusion with underlying consolidation left lower hemithorax. Electronically Signed   By: Lovey Newcomer M.D.   On: 09/26/2018 12:25   Ir Biliary Stent(s) Existing Access Inc Dilation Cath Exchange  Result Date: 09/25/2018 INDICATION: 78 year old female with advanced pancreatic cancer and malignant obstructed jaundice. She underwent percutaneous transhepatic cholangiogram and biliary drain placement last week. She presents today for placement of a palliative covered stent. EXAM: 1. Biliary stent placement. 2. Biliary drain exchange. MEDICATIONS: 500 mg Flagyl  and 500 mg aztreonam; The antibiotic was administered within an appropriate time frame prior to the initiation of the procedure. ANESTHESIA/SEDATION: Moderate (conscious) sedation was employed during this procedure. A total of Versed 1 mg and Fentanyl 50 mcg was administered intravenously. Moderate Sedation Time: 38 minutes. The patient's level of consciousness and vital signs were monitored continuously by radiology nursing throughout the procedure under my direct supervision. FLUOROSCOPY TIME:  Fluoroscopy Time: 9 minutes 48 seconds (160 mGy). COMPLICATIONS: SIR LEVEL B - Normal therapy, includes overnight admission for observation. Patient developed low-grade fever, tachycardia and tachypnea during recovery following the procedure. She was admitted for observation. PROCEDURE: Informed written consent was obtained from the patient after a thorough discussion of the procedural risks, benefits and alternatives. All questions were addressed. Maximal Sterile Barrier Technique was utilized including caps, mask, sterile gowns, sterile gloves, sterile drape, hand hygiene and skin antiseptic. A timeout was performed prior to the initiation of the procedure. Local anesthesia was attained around the existing drain exit site using 1% lidocaine. The retention suture was cut. The  drain was transected and removed over a Bentson wire. An 8 French sheath was then advanced over the wire and into the duodenum. A pull-back, over the wire cholangiogram was then performed through the sheath. 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 The duodenum remains patent with good peristalsis. Biliary ductal dilatation has improved following placement of the internal/external biliary drainage catheter. A 5 French angled catheter was advanced over the wire into the duodenum. The Bentson wire was exchanged for a superstiff Amplatz wire which was advanced further into the duodenum. The angiographic catheter  was removed. The 8 French sheath was removed and exchanged for a 9 French sheath which was advanced into the duodenum. A 10 x 60 wall flex covered stent was advanced through the sheath and across the stenosis. The stent was then successfully deployed across the stenosis. Post dilation was performed using an 8 x 40 mm Conquest balloon. Contrast injection confirms patency of the biliary tree. The distal aspect of the stent is just within the duodenum. A new 10 [place] biliary drain was advanced over the wire and formed with the locking loop in the duodenum. The drain was capped and secured to the skin with 0 Prolene suture. The patient tolerated the procedure well. IMPRESSION: Successful placement of a 10 x 60 wall flex covered stent across the malignant common bile duct stricture. Successful exchange for a new 10 French internal/external biliary drainage catheter. PLAN: 1. Admit for observation. 2. If patient doing well tomorrow, drain to be capped prior to discharge. 3. Patient to return Interventional Radiology in 4-6 weeks for cholangiogram and possible removal of biliary drain if stent is functioning adequately. Signed, Criselda Peaches, MD, [facility] Radiology Electronically Signed   By: Jacqulynn Cadet M.D.   On: 09/25/2018 18:51    Labs:  CBC: Recent Labs    09/23/18 0900 09/25/18 1350 09/26/18 0557 09/27/18 0350  WBC 8.0 7.8 5.0 4.1  HGB 9.7* 8.4* 8.3* 8.0*  HCT 29.4* 26.1* 25.8* 25.1*  PLT 73* 30* 33* 33*    COAGS: Recent Labs    09/18/18 1118 09/25/18 1350  INR 1.4* 1.3*    BMP: Recent Labs    09/23/18 0900 09/25/18 1350 09/26/18 0557 09/27/18 0350  NA 136 135 137 139  K 3.9 4.4 4.2 4.1  CL 101 101 106 110  CO2 _0 GLUCOSE 199* 264* 172* 119*  BUN 10 40* 41* 39*  CALCIUM 10.1 9.1 8.6* 8.5*  CREATININE 0.73 1.17* 1.02* 0.88  GFRNONAA >60 45* 53* >60  GFRAA >60 52* >60 >60    LIVER FUNCTION TESTS:  Recent Labs    09/23/18 0900 09/25/18 1350 09/26/18 0557 09/27/18 0350  BILITOT 5.2* 3.9* 3.6* 4.0*  AST 121* 81* 63* 43*  ALT 80* 56* 48* 37  ALKPHOS 691* 356* 313* 220*  PROT 6.3* 6.0* 5.5* 5.2*  ALBUMIN 2.0* 2.0* 1.7* 1.6*    A/P:  Patient with history of advanced metastatic pancreatic cancer with malignant obstructive jaundice; status post PTC with internal and external biliary drain placement on 7/22 followed by placement of wall flex covered stent across common bile duct stricture and drain exchange on 7/29; afebrile, WBC 4.1, hemoglobin 8 down slightly from 8.3, platelets 33k, creatinine normal, total bilirubin 4 up slightly from 3.6; continue current plans for now; hydrate; would only consider capping drain if total bilirubin trends downward; tentatively scheduled for cholangiogram and possible  removal of biliary drain in 4 to 6 weeks if stent functioning adequately ;palliative care consult requested by oncology; transferred admitting team to [facility]; further plans as per [facility] and oncology; consider plain film/X-ray of the right wrist due to persistent pain    Electronically Signed: D. Rowe Robert, PA-C 09/27/2018, 2:07 PM   I spent a total of 15 minutes at the the patient's bedside AND on the patient's hospital floor or unit, greater than 50% of which was counseling/coordinating care for biliary drain    Patient ID: Carly [name], female   DOB: 20-Nov-1940, 78 y.o.   MRN: [id number]

## 2018-09-27 NOTE — Progress Notes (Signed)
Carly [name]   DOB:April 23, 1940   QZ#:[id number]   MAU#:[id number]  Oncology follow up   Subjective: Patient remains to be very fatigued, in bed all day, complains b/l wrist/arm pain, poor appetite.    Objective:  Vitals:   09/27/18 0526 09/27/18 1320  BP: 104/60 122/73  Pulse: (!) 115 (!) 112  Resp: 16   Temp: 98.6 F (37 C) 98.4 F (36.9 C)  SpO2: 92% 96%    Body mass index is 25.4 kg/m.  Intake/Output Summary (Last 24 hours) at 09/27/2018 1855 Last data filed at 09/27/2018 1800 Gross per 24 hour  Intake 1358.47 ml  Output 935 ml  Net 423.47 ml     (+) jaundice, pale, chronic ill appearing   No peripheral adenopathy  Lungs clear -- no rales or rhonchi  Heart regular rate and rhythm  Abdomen benign  MSK no focal spinal tenderness, no peripheral edema  Neuro nonfocal   CBG (last 3)  Recent Labs    09/27/18 0722 09/27/18 1121 09/27/18 1536  GLUCAP 110* 153* 150*     Labs:  Lab Results  Component Value Date   WBC 4.1 09/27/2018   HGB 8.0 (L) 09/27/2018   HCT 25.1 (L) 09/27/2018   MCV 98.0 09/27/2018   PLT 33 (L) 09/27/2018   NEUTROABS 7.3 09/25/2018    Urine Studies No results for input(s): UHGB, CRYS in the last 72 hours.  Invalid input(s): UACOL, UAPR, USPG, UPH, UTP, UGL, UKET, UBIL, UNIT, UROB, [place], UEPI, UWBC, [place], [employer], [place], [place], [place]  Basic Metabolic Panel: Recent Labs  Lab 09/23/18 0900 09/25/18 1350 09/26/18 0557 09/27/18 0350  NA 136 135 137 139  K 3.9 4.4 4.2 4.1  CL 101 101 106 110  CO2 '25 22 23 23  ' GLUCOSE 199* 264* 172* 119*  BUN 10 40* 41* 39*  CREATININE 0.73 1.17* 1.02* 0.88  CALCIUM 10.1 9.1 8.6* 8.5*   GFR Estimated Creatinine Clearance: 46 mL/min (by C-G formula based on SCr of 0.88 mg/dL). Liver Function Tests: Recent Labs  Lab 09/23/18 0900 09/25/18 1350 09/26/18 0557 09/27/18 0350  AST 121* 81* 63* 43*  ALT 80* 56* 48* 37  ALKPHOS 691* 356* 313* 220*  BILITOT 5.2* 3.9* 3.6* 4.0*  PROT 6.3* 6.0* 5.5*  5.2*  ALBUMIN 2.0* 2.0* 1.7* 1.6*   No results for input(s): LIPASE, AMYLASE in the last 168 hours. Recent Labs  Lab 09/26/18 1421  AMMONIA 40*   Coagulation profile Recent Labs  Lab 09/25/18 1350  INR 1.3*    CBC: Recent Labs  Lab 09/23/18 0900 09/25/18 1350 09/26/18 0557 09/27/18 0350  WBC 8.0 7.8 5.0 4.1  NEUTROABS 6.0 7.3  --   --   HGB 9.7* 8.4* 8.3* 8.0*  HCT 29.4* 26.1* 25.8* 25.1*  MCV 95.8 98.1 98.5 98.0  PLT 73* 30* 33* 33*   Cardiac Enzymes: No results for input(s): CKTOTAL, CKMB, CKMBINDEX, TROPONINI in the last 168 hours. BNP: Invalid input(s): POCBNP CBG: Recent Labs  Lab 09/27/18 0026 09/27/18 0355 09/27/18 0722 09/27/18 1121 09/27/18 1536  GLUCAP 130* 127* 110* 153* 150*   D-Dimer No results for input(s): DDIMER in the last 72 hours. Hgb A1c No results for input(s): HGBA1C in the last 72 hours. Lipid Profile No results for input(s): CHOL, HDL, LDLCALC, TRIG, CHOLHDL, LDLDIRECT in the last 72 hours. Thyroid function studies Recent Labs    09/26/18 1340  TSH 7.199*   Anemia work up No results for input(s): VITAMINB12, FOLATE, FERRITIN, TIBC, IRON, RETICCTPCT  in the last 72 hours. Microbiology Recent Results (from the past 240 hour(s))  SARS Coronavirus 2 (CEPHEID - Performed in [place] hospital lab), Hosp Order     Status: None   Collection Time: 09/18/18 11:21 AM   Specimen: Nasopharyngeal Swab  Result Value Ref Range Status   SARS Coronavirus 2 NEGATIVE NEGATIVE Final    Comment: (NOTE) If result is NEGATIVE SARS-CoV-2 target nucleic acids are NOT DETECTED. The SARS-CoV-2 RNA is generally detectable in upper and lower  respiratory specimens during the acute phase of infection. The lowest  concentration of SARS-CoV-2 viral copies this assay can detect is 250  copies / mL. A negative result does not preclude SARS-CoV-2 infection  and should not be used as the sole basis for treatment or other  patient management decisions.  A  negative result may occur with  improper specimen collection / handling, submission of specimen other  than nasopharyngeal swab, presence of viral mutation(s) within the  areas targeted by this assay, and inadequate number of viral copies  (<250 copies / mL). A negative result must be combined with clinical  observations, patient history, and epidemiological information. If result is POSITIVE SARS-CoV-2 target nucleic acids are DETECTED. The SARS-CoV-2 RNA is generally detectable in upper and lower  respiratory specimens dur ing the acute phase of infection.  Positive  results are indicative of active infection with SARS-CoV-2.  Clinical  correlation with patient history and other diagnostic information is  necessary to determine patient infection status.  Positive results do  not rule out bacterial infection or co-infection with other viruses. If result is PRESUMPTIVE POSTIVE SARS-CoV-2 nucleic acids MAY BE PRESENT.   A presumptive positive result was obtained on the submitted specimen  and confirmed on repeat testing.  While 2019 novel coronavirus  (SARS-CoV-2) nucleic acids may be present in the submitted sample  additional confirmatory testing may be necessary for epidemiological  and / or clinical management purposes  to differentiate between  SARS-CoV-2 and other Sarbecovirus currently known to infect humans.  If clinically indicated additional testing with an alternate test  methodology [phone]) is advised. The SARS-CoV-2 RNA is generally  detectable in upper and lower respiratory sp ecimens during the acute  phase of infection. The expected result is Negative. Fact Sheet for Patients:  StrictlyIdeas.no Fact Sheet for Healthcare Providers: BankingDealers.co.za This test is not yet approved or cleared by the [place] FDA and has been authorized for detection and/or diagnosis of SARS-CoV-2 by FDA under an Emergency Use  Authorization (EUA).  This EUA will remain in effect (meaning this test can be used) for the duration of the COVID-19 declaration under Section 564(b)(1) of the Act, 21 U.S.C. section 360bbb-3(b)(1), unless the authorization is terminated or revoked sooner. Performed at [facility], [place] [address], [place], [place] [address]   Urine Culture     Status: Abnormal (Preliminary result)   Collection Time: 09/26/18  1:22 PM   Specimen: Urine, Clean Catch  Result Value Ref Range Status   Specimen Description   Final    URINE, CLEAN CATCH Performed at [facility], [place] [address] [place], [place] [address]    Special Requests   Final    NONE Performed at [facility], [place] [address], [place], [place] [address]    Culture 20,000 COLONIES/mL ENTEROCOCCUS FAECALIS (A)  Final   Report Status PENDING  Incomplete      Studies:  Dg Chest Port 1 View  Result Date:  09/26/2018 CLINICAL DATA:  Shortness of breath EXAM: PORTABLE CHEST 1 VIEW COMPARISON:  Chest radiograph 08/02/2010 FINDINGS: Right anterior chest wall Port-A-Cath is present with tip projecting over the superior vena cava. Enlarged cardiac and mediastinal contours. Aortic atherosclerosis. Mild left pleural effusion with underlying consolidation. No pneumothorax. IMPRESSION: Small left pleural effusion with underlying consolidation left lower hemithorax. Electronically Signed   By: Lovey Newcomer M.D.   On: 09/26/2018 12:25    Assessment: 78 y.o. 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  Obstructive jaundice secondary to cancer, status post PTC and CBD stent placement  2. 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  Moderate anemia and thrombocytopenia, secondary to chemotherapy 4.  Abdominal pain, anorexia, and malnutrition  5. Deconditioning  6. Code status: she agrees with DNR/DNI  Plan:  -I spoke with Karisha and his son Gerald Stabs on the phone  this afternoon, about home hospice vs inpt hospice. Pt is agreeable, and wants to go home, but she understands she would need total care if she goes home and she has to stay with her son's family. She used to live by herself. Gerald Stabs is leaning towards inpt hospice.  -Appricate palliative care Dr. Janyth Pupa input, he will have a family meeting with her sons tomorrow  -I will be rounding tomorrow, and will try to participate the family meeting.   Truitt Merle, MD 09/27/2018

## 2018-09-27 NOTE — Progress Notes (Signed)
Initial Nutrition Assessment  INTERVENTION:   -Ensure Enlive po BID, each supplement provides 350 kcal and 20 grams of protein -Carnation Instant Breakfast Light w/ 2% milk TID- each provides  150 kcal and 13g protein  NUTRITION DIAGNOSIS:   Increased nutrient needs related to cancer and cancer related treatments as evidenced by estimated needs.  GOAL:   Patient will meet greater than or equal to 90% of their needs  MONITOR:   PO intake, Supplement acceptance, Labs, Weight trends, I & O's  REASON FOR ASSESSMENT:   Consult, Malnutrition Screening Tool Assessment of nutrition requirement/status  ASSESSMENT:   78 y.o. 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 Burr Medico, recent  chemo on Monday (Abraxane) who was admitted on 09/25/2018 by IR for internalization/stenting of biliary  Drain.  Admitted for malignant obstructive jaundice.  **[place] briefly on the phone yesterday 7/30, pt was getting ready to have an x-ray. Unable to reach pt by phone today. Per oncology note, pt has had poor appetite and PO intake PTA. Pt last received chemotherapy 7/27. Pt has been drinking ~1 [employer] a day and drinking Gatorade. MD recommended increasing supplements to 3-4 times daily. Pt was also started on Megace. Per note today, oncology is recommending no further chemo and have consulted palliative care. Will monitor for GOC going forward.  Since admission pt has eaten very little with bites of meals.Pt currently ordered Ensure supplements and RD has ordered CIB TID.   Per weight records, no significant weight changes noted.   Medications: Megace suspension daily, K-DUR tablet daily Labs reviewed: CBGs: 110-127  NUTRITION - FOCUSED PHYSICAL EXAM:  Unable to perform -working remotely.  Diet Order:   Diet Order            Diet regular Room service appropriate? Yes; Fluid consistency: Thin  Diet  effective now              EDUCATION NEEDS:   No education needs have been identified at this time  Skin:  Skin Assessment: Reviewed RN Assessment  Last BM:  7/28  Height:   Ht Readings from Last 1 Encounters:  09/25/18 5\' 2"  (1.575 m)    Weight:   Wt Readings from Last 1 Encounters:  09/25/18 63 kg    Ideal Body Weight:  50 kg  BMI:  Body mass index is 25.4 kg/m.  Estimated Nutritional Needs:   Kcal:  1650-1850  Protein:  80-90g  Fluid:  1.8L/day  Clayton Bibles, MS, RD, LDN [place] Dietitian Pager: [phone] After Hours Pager: [phone]

## 2018-09-28 DIAGNOSIS — J189 Pneumonia, unspecified organism: Secondary | ICD-10-CM

## 2018-09-28 LAB — URINE CULTURE: Culture: 20000 — AB

## 2018-09-28 LAB — GLUCOSE, CAPILLARY
Glucose-Capillary: 130 mg/dL — ABNORMAL HIGH (ref 70–99)
Glucose-Capillary: 89 mg/dL (ref 70–99)

## 2018-09-28 LAB — T3, FREE: T3, Free: 1.2 pg/mL — ABNORMAL LOW (ref 2.0–4.4)

## 2018-09-28 MED ORDER — FENTANYL CITRATE (PF) 100 MCG/2ML IJ SOLN
25.0000 ug | INTRAMUSCULAR | Status: DC | PRN
  Administered 2018-09-28 – 2018-09-30 (×6): 25 ug via INTRAVENOUS
  Filled 2018-09-28 (×6): qty 2

## 2018-09-28 NOTE — TOC Progression Note (Signed)
Transition of Care [facility]) - Progression Note    Patient Details  Name: OLLA DELANCEY MRN: [id number] Date of Birth: [birth year]  Transition of Care [facility]) CM/SW Contact  Ross Ludwig, Miranda Phone Number: 09/28/2018, 6:29 PM  Clinical Narrative:     CSW spoke to patient's daughter in law Nehemiah Settle [phone] and confirmed they would like patient to go to the hospice home in [facility].  CSW contacted Beverlee Nims at [phone], and she requested clinicals to be faxed to her.  CSW faxed palliative note, md note, H and P and facesheet to [phone].  Beverlee Nims said there are not any beds today, but she will contact family and let them know when a bed is available.  CSW to continue to facilitate discharge planning.    Expected Discharge Plan and [place], hospice facility.         Expected Discharge Date: 09/27/18                                     Social Determinants of Health (SDOH) Interventions    Readmission Risk Interventions No flowsheet data found.

## 2018-09-28 NOTE — TOC Initial Note (Signed)
Transition of Care [facility]) - Initial/Assessment Note    Patient Details  Name: Carly [name] MRN: [id number] Date of Birth: 1940-09-03  [facility]) CM/SW Contact:    Ross Ludwig, LCSW Phone Number: 09/28/2018, 6:38 PM  Clinical Narrative:                  CSW received referral for patient needing hospice facility placement.  CSW spoke with patient's daughter in law, and also palliative physician, patient and family agreeable to hospice facility placement.  Patient and family were provided choice and they chose [facility].  CSW made formal request and referral to hospice home, they said there is not a bed available today, but possibly tomorrow.  CSW to facilitate discharge planning.  Expected Discharge Plan: [place] Barriers to Discharge: Continued Medical Work up, Hospice Bed not available   Patient Goals and CMS Choice Patient states their goals for this hospitalization and ongoing recovery are:: Family wants placement at [facility] facility for end of life care. CMS Medicare.gov Compare Post Acute Care list provided to:: Patient Represenative (must comment) Choice offered to / list presented to : Adult Children  Expected Discharge Plan and Services Expected Discharge Plan: [place] arrangements for the past 2 months: Single Family Home Expected Discharge Date: 09/27/18                                    Prior Living Arrangements/Services Living arrangements for the past 2 months: Single Family Home Lives with:: Self Patient language and need for interpreter reviewed:: Yes Do you feel safe going back to the place where you live?: No   Patient needs end of life care.  Need for Family Participation in Patient Care: Yes (Comment) Care giver support system in place?: Yes (comment)   Criminal Activity/Legal Involvement Pertinent to Current Situation/Hospitalization: No - Comment as  needed  Activities of Daily Living Home Assistive Devices/Equipment: CBG Meter ADL Screening (condition at time of admission) Patient's cognitive ability adequate to safely complete daily activities?: Yes Is the patient deaf or have difficulty hearing?: No Does the patient have difficulty seeing, even when wearing glasses/contacts?: No Does the patient have difficulty concentrating, remembering, or making decisions?: No Patient able to express need for assistance with ADLs?: Yes Does the patient have difficulty dressing or bathing?: Yes Independently performs ADLs?: No Communication: Independent Dressing (OT): Needs assistance Is this a change from baseline?: Pre-admission baseline Grooming: Needs assistance Is this a change from baseline?: Pre-admission baseline Feeding: Needs assistance Is this a change from baseline?: Pre-admission baseline Bathing: Needs assistance Is this a change from baseline?: Pre-admission baseline Toileting: Needs assistance Is this a change from baseline?: Pre-admission baseline In/Out Bed: Needs assistance Is this a change from baseline?: Pre-admission baseline Walks in Home: Needs assistance Is this a change from baseline?: Pre-admission baseline Does the patient have difficulty walking or climbing stairs?: Yes Weakness of Legs: Both Weakness of Arms/Hands: Both  Permission Sought/Granted      Share Information with NAMEAsiana, Benninger Relative [phone]  [phone] or VANECIA, LIMPERT [phone]  Permission granted to share info w AGENCY: Hospice agency        Emotional Assessment Appearance:: Appears stated age   Affect (typically observed): Calm, Appropriate Orientation: : Oriented to Self Alcohol / Substance Use: Not Applicable Psych Involvement: No (comment)  Admission diagnosis:  Malignant neoplasm of pancreas, unspecified location of malignancy ([place]) [C25.9] Malignant obstructive jaundice ([place]) [K83.1, C80.1] Patient  Active Problem List   Diagnosis Date Noted  . Hypertension   . Thrombocytopenia ([place])   . FTT (failure to thrive) in adult   . PNA (pneumonia)   . Malignant obstructive jaundice ([place]) 09/25/2018  . Biliary obstruction due to malignant neoplasm ([place]) 09/18/2018  . Genetic testing 08/15/2017  . Family history of breast cancer   . Port-A-Cath in place 07/19/2017  . Goals of care, counseling/discussion 06/29/2017  . Malignant neoplasm of pancreas ([place]) 06/13/2017  . Newly diagnosed diabetes ([place]) 04/17/2017  . Osteopenia 03/27/2014  . History of uterine cancer 03/26/2013  . Cellulitis and abscess of hand 09/17/2011  . Dog bite(E906.0) 09/17/2011  . Hypothyroidism 09/17/2011  . HYPERLIPIDEMIA 03/16/2007  . ALLERGIC AND NONALLERGIC RHINITIS 03/16/2007  . 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

## 2018-09-28 NOTE — Progress Notes (Signed)
PROGRESS NOTE    Carly [name]  NTI:[id number] DOB: Jun 24, 1940 DOA: 09/25/2018 PCP: [name], MD    Brief Narrative:   Carly [name] is a 78 y.o. 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 Burr Medico, recent  chemo on Monday (Abraxane) who was admitted on 09/25/2018 by IR for internalization/stenting of biliary Drain.    Patient was admitted overnight for observation, with  Iv fluids, IV antibiotics, post procedure patient was noticed to be tachycardic and tachypnea, tempeture at 99. Patient was evaluated the morning of 7/30  by Dr. Laurence Ferrari, and patient was not considered to be stable to be discharged.  Patient continue to be tachycardic, mild tachypneic and with generalized weakness. She was admitted to [facility] service for further evaluation. Oncology consulted and recommendations given.    Assessment & Plan:   Active Problems:   Hypothyroidism   Malignant neoplasm of pancreas (HCC)   Malignant obstructive jaundice (HCC)   Hypertension   Thrombocytopenia (HCC)   FTT (failure to thrive) in adult   PNA (pneumonia)  Malignant obstructive Jaundice secondary to  adenocarcinoma of the pancreas: - continue with aztreonam.for now.  Appreciate oncology and IR recommendations.  Pt continues to be jaundiced, ill appearing and deteriorating. The bilirubin continues to rise.   Called palliative care consult for goals of care and possible transition to hospice.  After discussion with family, pln for residential hospice.     Health care associated pneumonia:  Resume aztreonam till tomorrow.    Thrombocytopenia:      Hypotension:  Resolved.    Anemia of chronic disease Hemoglobin stable around 8.    Tachycardia:  Possibly from the disease process.      DVT prophylaxis: scd's Code Status: full code.  Family Communication: none at bedside.  Disposition Plan: residential hospice when bed available.   Consultants:    Oncology   IR    Procedures: none.   Antimicrobials: none.   Subjective: Jaundiced.   Objective: Vitals:   09/27/18 1320 09/27/18 2114 09/28/18 0440 09/28/18 1358  BP: 122/73 118/65 117/61 (!) 111/58  Pulse: (!) 112 (!) 113 (!) 110 (!) 104  Resp:  16 16 (!) 22  Temp: 98.4 F (36.9 C) 98.6 F (37 C) 98.8 F (37.1 C) 98.5 F (36.9 C)  TempSrc: Oral Oral Oral Oral  SpO2: 96% 95% 94% 96%  Weight:      Height:        Intake/Output Summary (Last 24 hours) at 09/28/2018 1430 Last data filed at 09/28/2018 0958 Gross per 24 hour  Intake 1690 ml  Output 1860 ml  Net -170 ml   Filed Weights   09/25/18 2050  Weight: 63 kg    Examination:  General exam: cachetic, ill appearing. Not in dsitress.  Respiratory system: diminished air entry at bases.  Cardiovascular system: S1 & S2 heard, tachycardic.  Gastrointestinal system: Abdomen is soft NT nd bs+ Central nervous system: Alert and oriented. Non focal.  Extremities: no pedal edema.  Skin: No rashes, lesions or ulcers Psychiatry: Mood & affect appropriate.     Data Reviewed: I have personally reviewed following labs and imaging studies  CBC: Recent Labs  Lab 09/23/18 0900 09/25/18 1350 09/26/18 0557 09/27/18 0350  WBC 8.0 7.8 5.0 4.1  NEUTROABS 6.0 7.3  --   --   HGB 9.7* 8.4* 8.3* 8.0*  HCT 29.4* 26.1* 25.8* 25.1*  MCV 95.8 98.1 98.5 98.0  PLT 73* 30* 33* 33*  Basic Metabolic Panel: Recent Labs  Lab 09/23/18 0900 09/25/18 1350 09/26/18 0557 09/27/18 0350  NA 136 135 137 139  K 3.9 4.4 4.2 4.1  CL 101 101 106 110  CO2 25 22 23 23   GLUCOSE 199* 264* 172* 119*  BUN 10 40* 41* 39*  CREATININE 0.73 1.17* 1.02* 0.88  CALCIUM 10.1 9.1 8.6* 8.5*   GFR: Estimated Creatinine Clearance: 46 mL/min (by C-G formula based on SCr of 0.88 mg/dL). Liver Function Tests: Recent Labs  Lab 09/23/18 0900 09/25/18 1350 09/26/18 0557 09/27/18 0350  AST 121* 81* 63* 43*  ALT 80* 56* 48* 37  ALKPHOS 691* 356* 313*  220*  BILITOT 5.2* 3.9* 3.6* 4.0*  PROT 6.3* 6.0* 5.5* 5.2*  ALBUMIN 2.0* 2.0* 1.7* 1.6*   No results for input(s): LIPASE, AMYLASE in the last 168 hours. Recent Labs  Lab 09/26/18 1421  AMMONIA 40*   Coagulation Profile: Recent Labs  Lab 09/25/18 1350  INR 1.3*   Cardiac Enzymes: No results for input(s): CKTOTAL, CKMB, CKMBINDEX, TROPONINI in the last 168 hours. BNP (last 3 results) No results for input(s): PROBNP in the last 8760 hours. HbA1C: No results for input(s): HGBA1C in the last 72 hours. CBG: Recent Labs  Lab 09/27/18 1536 09/27/18 2143 09/27/18 2332 09/28/18 0409 09/28/18 0744  GLUCAP 150* 182* 152* 130* 89   Lipid Profile: No results for input(s): CHOL, HDL, LDLCALC, TRIG, CHOLHDL, LDLDIRECT in the last 72 hours. Thyroid Function Tests: Recent Labs    09/26/18 1340 09/27/18 0350  TSH 7.199*  --   FREET4  --  0.80  T3FREE  --  1.2*   Anemia Panel: No results for input(s): VITAMINB12, FOLATE, FERRITIN, TIBC, IRON, RETICCTPCT in the last 72 hours. Sepsis Labs: No results for input(s): PROCALCITON, LATICACIDVEN in the last 168 hours.  Recent Results (from the past 240 hour(s))  Urine Culture     Status: Abnormal   Collection Time: 09/26/18  1:22 PM   Specimen: Urine, Clean Catch  Result Value Ref Range Status   Specimen Description   Final    URINE, CLEAN CATCH Performed at [facility], [place] [address] [place], [place] [address]    Special Requests   Final    NONE Performed at [facility], [place] [address] [place], [place] [address]    Culture 20,000 COLONIES/mL ENTEROCOCCUS FAECALIS (A)  Final   Report Status 09/28/2018 FINAL  Final   Organism ID, Bacteria ENTEROCOCCUS FAECALIS (A)  Final      Susceptibility   Enterococcus faecalis - MIC*    AMPICILLIN <=2 SENSITIVE Sensitive     LEVOFLOXACIN 1 SENSITIVE Sensitive     NITROFURANTOIN <=16 SENSITIVE Sensitive     VANCOMYCIN 1 SENSITIVE Sensitive      * 20,000 COLONIES/mL ENTEROCOCCUS FAECALIS         Radiology Studies: No results found.      Scheduled Meds: . eltrombopag  100 mg Oral Daily  . feeding supplement (ENSURE ENLIVE)  237 mL Oral BID BM  . levothyroxine  25 mcg Oral Q0600  . Netarsudil-Latanoprost  1 drop Both Eyes QHS  . nystatin  5 mL Oral QID  . pantoprazole  40 mg Oral Daily  . potassium chloride SA  20 mEq Oral Daily   Continuous Infusions: . sodium chloride 75 mL/hr at 09/26/18 1201  . aztreonam 1 g (09/28/18 1352)  . metronidazole 500 mg (09/28/18 0958)     LOS: 2 days  Time spent: 22 minutes.     Hosie Poisson, MD [facility] Pager [phone]  If 7PM-7AM, please contact night-coverage [email or web address] Password [email or web address] 09/28/2018, 2:30 PM

## 2018-09-28 NOTE — Progress Notes (Signed)
Daily Progress Note   Patient Name: Carly [name]       Date: 09/28/2018 DOB: 07-07-1940  Age: 78 y.o. MRN#: [id number] Attending Physician: Hosie Poisson, MD Primary Care Physician: [name], MD Admit Date: 09/25/2018  Reason for Consultation/Follow-up: Establishing goals of care  Subjective: Family meeting today in conjunction with Dr. Burr Medico.  Several family including patient's sons present.  We discussed [name]'s clinical course and acute worsening over the past week.  Values and goals of care important to patient and family were attempted to be elicited.  She reports that the most important things to focus on moving forward are her comfort and being able to spend time with family.   We discussed difference between a aggressive medical intervention path and a palliative, comfort focused care path and how they relate to this goal.    Her son then noted that family has been looking at options and has preference for pursuing placement at [facility] in [facility] for end of life care.  Dr. Burr Medico and I reviewed prognosis and feeling that we are likely in last weeks of life, particularly with plan to focus on comfort and stop antibiotic therapy.  Following discussion, patient expressed primary goal of comfort and desire to transition to residential hospice.  Questions and concerns addressed.   PMT will continue to support holistically.  Length of Stay: 2  Current Medications: Scheduled Meds:  . eltrombopag  100 mg Oral Daily  . feeding supplement (ENSURE ENLIVE)  237 mL Oral BID BM  . levothyroxine  25 mcg Oral Q0600  . Netarsudil-Latanoprost  1 drop Both Eyes QHS  . nystatin  5 mL Oral QID  . pantoprazole  40 mg Oral Daily  . potassium chloride SA  20 mEq Oral Daily     Continuous Infusions: . sodium chloride 75 mL/hr at 09/26/18 1201  . aztreonam 1 g (09/28/18 0500)  . metronidazole 500 mg (09/28/18 0958)    PRN Meds: acetaminophen, ALPRAZolam, diphenoxylate-atropine, fentaNYL (SUBLIMAZE) injection, HYDROcodone-acetaminophen, hyoscyamine, magic mouthwash w/lidocaine, ondansetron, prochlorperazine, sodium chloride flush, traZODone  Physical Exam         General: Alert, awake, in no acute distress. Frail and chronically ill-appearing. Heart: Tachycardic. No murmur appreciated. Lungs: Decreased air movement, clear Abdomen: Soft, nontender, nondistended, positive bowel sounds.  Ext: No significant  edema Skin: Warm and dry Neuro: Grossly intact, nonfocal.  Vital Signs: BP 117/61 (BP Location: Left Arm)   Pulse (!) 110   Temp 98.8 F (37.1 C) (Oral)   Resp 16   Ht 5\' 2"  (1.575 m)   Wt 63 kg   SpO2 94%   BMI 25.40 kg/m  SpO2: SpO2: 94 % O2 Device: O2 Device: Room Air O2 Flow Rate: O2 Flow Rate (L/min): 0 L/min  Intake/output summary:   Intake/Output Summary (Last 24 hours) at 09/28/2018 1120 Last data filed at 09/28/2018 0600 Gross per 24 hour  Intake 1490 ml  Output 1935 ml  Net -445 ml   LBM: Last BM Date: 09/24/18 Baseline Weight: Weight: 63 kg Most recent weight: Weight: 63 kg       Palliative Assessment/Data:    Flowsheet Rows     Most Recent Value  Intake Tab  Referral Department  Oncology  Unit at Time of Referral  Med/Surg Unit  Palliative Care Primary Diagnosis  Cancer  Date Notified  09/26/18  Palliative Care Type  New Palliative care  Reason for referral  Clarify Goals of Care  Date of Admission  09/25/18  Date first seen by Palliative Care  09/27/18  # of days Palliative referral response time  1 Day(s)  # of days IP prior to Palliative referral  1  Clinical Assessment  Palliative Performance Scale Score  30%  Psychosocial & Spiritual Assessment  Palliative Care Outcomes  Patient/Family meeting held?  Yes  Who  was at the meeting?  Patient  Palliative Care Outcomes  Changed CPR status      Patient Active Problem List   Diagnosis Date Noted  . Hypertension   . Thrombocytopenia ([place])   . FTT (failure to thrive) in adult   . PNA (pneumonia)   . 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 Genetic testing 08/15/2017  . Family history of breast cancer   . Port-A-Cath in place 07/19/2017  . Goals of care, counseling/discussion 06/29/2017  . Malignant neoplasm of pancreas ([place]) 06/13/2017  . Newly diagnosed diabetes ([place]) 04/17/2017  . Osteopenia 03/27/2014  . History of uterine cancer 03/26/2013  . Cellulitis and abscess of hand 09/17/2011  . Dog bite(E906.0) 09/17/2011  . Hypothyroidism 09/17/2011  . HYPERLIPIDEMIA 03/16/2007  . ALLERGIC AND NONALLERGIC RHINITIS 03/16/2007  . 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 female  with past medical history of metastatic pancreatic cancer admitted on 09/25/2018 with malignant obstructive jaundice, tachycardia and pneumonia.  Assessment: Patient Active Problem List   Diagnosis Date Noted  . Hypertension   . Thrombocytopenia ([place])   . FTT (failure to thrive) in adult   . PNA (pneumonia)   . 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 Genetic testing 08/15/2017  . Family history of breast cancer   . Port-A-Cath in place 07/19/2017  . Goals of care, counseling/discussion 06/29/2017  . Malignant neoplasm of pancreas ([place]) 06/13/2017  . Newly diagnosed diabetes ([place]) 04/17/2017  . Osteopenia 03/27/2014  . History of uterine cancer 03/26/2013  . Cellulitis and abscess of hand 09/17/2011  . Dog bite(E906.0) 09/17/2011  . Hypothyroidism 09/17/2011  . HYPERLIPIDEMIA 03/16/2007  . ALLERGIC AND NONALLERGIC RHINITIS 03/16/2007  .  Allergic-infective asthma 03/13/2007     Recommendations/Plan:  Pain: Continues to be an issue.  Will plan for  transition to IV fentanyl to see if better pain control.  This will certainly be an issue moving forward and she will require continued titration here and at residential hospice.  GOC: Patient expressed desire to transition to residential hospice for end of life care.  She has end stage pancreatic cancer with obstruction and bilirubin starting to climb again despite drain placement.  She has very poor nutrition and has only taken in some cranberry juice in the last 24 hours.  Reports not even peach cobbler (her favorite) sounds good anymore.  She is still receiving antibiotics (HCAP and ? abdominal source) but understands that these will be discontinued on discharge.  Discussed stopping now, but she is hopeful to be able to see family once she arrives at hospice ([place] visitor restrictions prohibit visitation in the hospital) and will therefore continue these for now. I did stop other interventions such as labs, insulin coverage, and decreased pill burden.    Goals of Care and Additional Recommendations:  Limitations on Scope of Treatment: Transition to comfort care with goal of residential hospice  Code Status:    Code Status Orders  (From admission, onward)         Start     Ordered   09/27/18 1744  Do not attempt resuscitation (DNR)  Continuous    Question Answer Comment  In the event of cardiac or respiratory ARREST Do not call a "code blue"   In the event of cardiac or respiratory ARREST Do not perform Intubation, CPR, defibrillation or ACLS   In the event of cardiac or respiratory ARREST Use medication by any route, position, wound care, and other measures to relive pain and suffering. May use oxygen, suction and manual treatment of airway obstruction as needed for comfort.      09/27/18 1743        Code Status History    Date Active Date Inactive Code Status Order ID  Comments User Context   09/18/2011 0508 09/20/2011 1934 Full Code [id number]  Montey Hora, RN Inpatient   Advance Care Planning Activity    Advance Directive Documentation     Most Recent Value  Type of Advance Directive  Healthcare Power of Attorney, Living will  Pre-existing out of facility DNR order (yellow form or pink MOST form)  -  "MOST" Form in Place?  -       Prognosis:   < 2 weeks most likely with shift to full comfort.  See [place] above.  Discharge Planning:  Hospice facility- D/w social work  Care plan was discussed with patient, family, Dr. Burr Medico, Dr. Karleen Hampshire  Thank you for allowing the Palliative Medicine Team to assist in the care of this patient.   Time In: 1000 Time Out: 1045 Total Time 45 Prolonged Time Billed no      Greater than 50%  of this time was spent counseling and coordinating care related to the above assessment and plan.  Micheline Rough, MD  Please contact Palliative Medicine Team phone at [phone] for questions and concerns.

## 2018-09-28 NOTE — Progress Notes (Signed)
Carly [name]   DOB:February 05, 1941   UU#:[id number]   HKV#:[id number]  Oncology follow up   Subjective: Dr. Domingo Cocking and I had family meeting with patient, her 2 sons, daughter-in-law and 2 grandchildren in her room this morning. Carly [name] is awake alert, and answers questions appropriately.    Objective:  Vitals:   09/28/18 0440 09/28/18 1358  BP: 117/61 (!) 111/58  Pulse: (!) 110 (!) 104  Resp: 16 (!) 22  Temp: 98.8 F (37.1 C) 98.5 F (36.9 C)  SpO2: 94% 96%    Body mass index is 25.4 kg/m.  Intake/Output Summary (Last 24 hours) at 09/28/2018 1449 Last data filed at 09/28/2018 0958 Gross per 24 hour  Intake 1690 ml  Output 1860 ml  Net -170 ml     (+) jaundice, pale, chronic ill appearing   No peripheral adenopathy  Lungs clear -- no rales or rhonchi  Heart regular rate and rhythm  Abdomen benign  MSK no focal spinal tenderness, no peripheral edema  Neuro nonfocal   CBG (last 3)  Recent Labs    09/27/18 2332 09/28/18 0409 09/28/18 0744  GLUCAP 152* 130* 89     Labs:  Lab Results  Component Value Date   WBC 4.1 09/27/2018   HGB 8.0 (L) 09/27/2018   HCT 25.1 (L) 09/27/2018   MCV 98.0 09/27/2018   PLT 33 (L) 09/27/2018   NEUTROABS 7.3 09/25/2018    Urine Studies No results for input(s): UHGB, CRYS in the last 72 hours.  Invalid input(s): UACOL, UAPR, USPG, UPH, UTP, UGL, UKET, UBIL, UNIT, UROB, [place], UEPI, UWBC, [place], [place], [place], [place], [place]  Basic Metabolic Panel: Recent Labs  Lab 09/23/18 0900 09/25/18 1350 09/26/18 0557 09/27/18 0350  NA 136 135 137 139  K 3.9 4.4 4.2 4.1  CL 101 101 106 110  CO2 '25 22 23 23  ' GLUCOSE 199* 264* 172* 119*  BUN 10 40* 41* 39*  CREATININE 0.73 1.17* 1.02* 0.88  CALCIUM 10.1 9.1 8.6* 8.5*   GFR Estimated Creatinine Clearance: 46 mL/min (by C-G formula based on SCr of 0.88 mg/dL). Liver Function Tests: Recent Labs  Lab 09/23/18 0900 09/25/18 1350 09/26/18 0557 09/27/18 0350  AST 121* 81* 63* 43*  ALT 80* 56*  48* 37  ALKPHOS 691* 356* 313* 220*  BILITOT 5.2* 3.9* 3.6* 4.0*  PROT 6.3* 6.0* 5.5* 5.2*  ALBUMIN 2.0* 2.0* 1.7* 1.6*   No results for input(s): LIPASE, AMYLASE in the last 168 hours. Recent Labs  Lab 09/26/18 1421  AMMONIA 40*   Coagulation profile Recent Labs  Lab 09/25/18 1350  INR 1.3*    CBC: Recent Labs  Lab 09/23/18 0900 09/25/18 1350 09/26/18 0557 09/27/18 0350  WBC 8.0 7.8 5.0 4.1  NEUTROABS 6.0 7.3  --   --   HGB 9.7* 8.4* 8.3* 8.0*  HCT 29.4* 26.1* 25.8* 25.1*  MCV 95.8 98.1 98.5 98.0  PLT 73* 30* 33* 33*   Cardiac Enzymes: No results for input(s): CKTOTAL, CKMB, CKMBINDEX, TROPONINI in the last 168 hours. BNP: Invalid input(s): POCBNP CBG: Recent Labs  Lab 09/27/18 1536 09/27/18 2143 09/27/18 2332 09/28/18 0409 09/28/18 0744  GLUCAP 150* 182* 152* 130* 89   D-Dimer No results for input(s): DDIMER in the last 72 hours. Hgb A1c No results for input(s): HGBA1C in the last 72 hours. Lipid Profile No results for input(s): CHOL, HDL, LDLCALC, TRIG, CHOLHDL, LDLDIRECT in the last 72 hours. Thyroid function studies Recent Labs    09/26/18 1340 09/27/18 0350  TSH 7.199*  --   T3FREE  --  1.2*   Anemia work up No results for input(s): VITAMINB12, FOLATE, FERRITIN, TIBC, IRON, RETICCTPCT in the last 72 hours. Microbiology Recent Results (from the past 240 hour(s))  Urine Culture     Status: Abnormal   Collection Time: 09/26/18  1:22 PM   Specimen: Urine, Clean Catch  Result Value Ref Range Status   Specimen Description   Final    URINE, CLEAN CATCH Performed at [facility], [place] [address] [place], [place] [address]    Special Requests   Final    NONE Performed at [facility], [place] [address], [place], [place] [address]    Culture 20,000 COLONIES/mL ENTEROCOCCUS FAECALIS (A)  Final   Report Status 09/28/2018 FINAL  Final   Organism ID, Bacteria ENTEROCOCCUS FAECALIS (A)  Final      Susceptibility    Enterococcus faecalis - MIC*    AMPICILLIN <=2 SENSITIVE Sensitive     LEVOFLOXACIN 1 SENSITIVE Sensitive     NITROFURANTOIN <=16 SENSITIVE Sensitive     VANCOMYCIN 1 SENSITIVE Sensitive     * 20,000 COLONIES/mL ENTEROCOCCUS FAECALIS      Studies:  No results found.  Assessment: 78 y.o. 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  Obstructive jaundice secondary to cancer, status post PTC and CBD stent placement  2. 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  Moderate anemia and thrombocytopenia, secondary to chemotherapy 4.  Abdominal pain, anorexia, and malnutrition  5. Deconditioning  6. Code status: she agrees with DNR/DNI  Plan:  -Dr. Domingo Cocking and I discussed the goal of care and hospice with pt and her family members in length, they are in complete agreement with comfort care.  Patient and her family has decided residential hospice, Dr. Domingo Cocking will arrange, I appreciate it. -I will f/u as needed.   I spent a total of 30 mins for her visit today.   Truitt Merle, MD 09/28/2018

## 2018-09-29 MED ORDER — LORAZEPAM 2 MG/ML IJ SOLN
1.0000 mg | Freq: Once | INTRAMUSCULAR | Status: AC | PRN
  Administered 2018-09-30: 1 mg via INTRAVENOUS
  Filled 2018-09-29: qty 1

## 2018-09-29 NOTE — Progress Notes (Signed)
PROGRESS NOTE    Carly [name]  VFI:[id number] DOB: 1940/03/02 DOA: 09/25/2018 PCP: [name], MD    Brief Narrative:   Carly [name] is a 78 y.o. 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 Burr Medico, recent  chemo on Monday (Abraxane) who was admitted on 09/25/2018 by IR for internalization/stenting of biliary Drain.    Patient was admitted overnight for observation, with  Iv fluids, IV antibiotics, post procedure patient was noticed to be tachycardic and tachypnea, tempeture at 99. Patient was evaluated the morning of 7/30  by Dr. Laurence Ferrari, and patient was not considered to be stable to be discharged.  Patient continue to be tachycardic, mild tachypneic and with generalized weakness. She was admitted to [facility] service for further evaluation. Oncology consulted and recommendations given.    Assessment & Plan:   Active Problems:   Hypothyroidism   Malignant neoplasm of pancreas (HCC)   Malignant obstructive jaundice (HCC)   Hypertension   Thrombocytopenia (HCC)   FTT (failure to thrive) in adult   PNA (pneumonia)  Malignant obstructive Jaundice secondary to  adenocarcinoma of the pancreas: - continue with aztreonam.for now.  Appreciate oncology and IR recommendations.  Pt continues to be jaundiced, ill appearing and deteriorating. The bilirubin continues to rise.   Called palliative care consult for goals of care and possible transition to hospice.  After discussion with family, plan for residential hospice.  Awaiting for a bed at residential hospice.    Health care associated pneumonia:  Resume aztreonam till discharge.    Thrombocytopenia:  No bleeding so far.     Hypotension:  Resolved.    Anemia of chronic disease Hemoglobin stable around 8.    Tachycardia:  Possibly from the disease process.      DVT prophylaxis: scd's Code Status: full code.  Family Communication: none at bedside.  Disposition Plan:  residential hospice when bed available.   Consultants:   Oncology   IR    Procedures: none.   Antimicrobials: none.   Subjective: Jaundiced. ,appears comfortable.   Objective: Vitals:   09/28/18 0440 09/28/18 1358 09/28/18 2230 09/29/18 0515  BP: 117/61 (!) 111/58 (!) 114/57 123/65  Pulse: (!) 110 (!) 104 100 96  Resp: 16 (!) 22 (!) 22 20  Temp: 98.8 F (37.1 C) 98.5 F (36.9 C) 98.4 F (36.9 C) 98.7 F (37.1 C)  TempSrc: Oral Oral Oral Oral  SpO2: 94% 96% 96% 96%  Weight:      Height:        Intake/Output Summary (Last 24 hours) at 09/29/2018 1852 Last data filed at 09/29/2018 1700 Gross per 24 hour  Intake 1404.87 ml  Output 850 ml  Net 554.87 ml   Filed Weights   09/25/18 2050  Weight: 63 kg    Examination:  General exam: cachetic, ill appearing. Not in dsitress.  Respiratory system: diminished air entry at bases.  Cardiovascular system: S1 & S2 heard, tachycardic.  Gastrointestinal system: Abdomen is soft NT nd bs+ Central nervous system: Alert and oriented. Non focal.  Extremities: no pedal edema.  Skin: No rashes, lesions or ulcers Psychiatry: Mood & affect appropriate.     Data Reviewed: I have personally reviewed following labs and imaging studies  CBC: Recent Labs  Lab 09/23/18 0900 09/25/18 1350 09/26/18 0557 09/27/18 0350  WBC 8.0 7.8 5.0 4.1  NEUTROABS 6.0 7.3  --   --   HGB 9.7* 8.4* 8.3* 8.0*  HCT 29.4* 26.1* 25.8* 25.1*  MCV 95.8 98.1 98.5 98.0  PLT 73* 30* 33* 33*   Basic Metabolic Panel: Recent Labs  Lab 09/23/18 0900 09/25/18 1350 09/26/18 0557 09/27/18 0350  NA 136 135 137 139  K 3.9 4.4 4.2 4.1  CL 101 101 106 110  CO2 25 22 23 23   GLUCOSE 199* 264* 172* 119*  BUN 10 40* 41* 39*  CREATININE 0.73 1.17* 1.02* 0.88  CALCIUM 10.1 9.1 8.6* 8.5*   GFR: Estimated Creatinine Clearance: 46 mL/min (by C-G formula based on SCr of 0.88 mg/dL). Liver Function Tests: Recent Labs  Lab 09/23/18 0900 09/25/18 1350 09/26/18  0557 09/27/18 0350  AST 121* 81* 63* 43*  ALT 80* 56* 48* 37  ALKPHOS 691* 356* 313* 220*  BILITOT 5.2* 3.9* 3.6* 4.0*  PROT 6.3* 6.0* 5.5* 5.2*  ALBUMIN 2.0* 2.0* 1.7* 1.6*   No results for input(s): LIPASE, AMYLASE in the last 168 hours. Recent Labs  Lab 09/26/18 1421  AMMONIA 40*   Coagulation Profile: Recent Labs  Lab 09/25/18 1350  INR 1.3*   Cardiac Enzymes: No results for input(s): CKTOTAL, CKMB, CKMBINDEX, TROPONINI in the last 168 hours. BNP (last 3 results) No results for input(s): PROBNP in the last 8760 hours. HbA1C: No results for input(s): HGBA1C in the last 72 hours. CBG: Recent Labs  Lab 09/27/18 1536 09/27/18 2143 09/27/18 2332 09/28/18 0409 09/28/18 0744  GLUCAP 150* 182* 152* 130* 89   Lipid Profile: No results for input(s): CHOL, HDL, LDLCALC, TRIG, CHOLHDL, LDLDIRECT in the last 72 hours. Thyroid Function Tests: Recent Labs    09/27/18 0350  FREET4 0.80  T3FREE 1.2*   Anemia Panel: No results for input(s): VITAMINB12, FOLATE, FERRITIN, TIBC, IRON, RETICCTPCT in the last 72 hours. Sepsis Labs: No results for input(s): PROCALCITON, LATICACIDVEN in the last 168 hours.  Recent Results (from the past 240 hour(s))  Urine Culture     Status: Abnormal   Collection Time: 09/26/18  1:22 PM   Specimen: Urine, Clean Catch  Result Value Ref Range Status   Specimen Description   Final    URINE, CLEAN CATCH Performed at [facility], [place] [address], [place], [place] [address]    Special Requests   Final    NONE Performed at [facility], [place] [address] [place], [place] [address]    Culture 20,000 COLONIES/mL ENTEROCOCCUS FAECALIS (A)  Final   Report Status 09/28/2018 FINAL  Final   Organism ID, Bacteria ENTEROCOCCUS FAECALIS (A)  Final      Susceptibility   Enterococcus faecalis - MIC*    AMPICILLIN <=2 SENSITIVE Sensitive     LEVOFLOXACIN 1 SENSITIVE Sensitive     NITROFURANTOIN <=16 SENSITIVE  Sensitive     VANCOMYCIN 1 SENSITIVE Sensitive     * 20,000 COLONIES/mL ENTEROCOCCUS FAECALIS         Radiology Studies: No results found.      Scheduled Meds: . eltrombopag  100 mg Oral Daily  . feeding supplement (ENSURE ENLIVE)  237 mL Oral BID BM  . levothyroxine  25 mcg Oral Q0600  . Netarsudil-Latanoprost  1 drop Both Eyes QHS  . nystatin  5 mL Oral QID  . pantoprazole  40 mg Oral Daily  . potassium chloride SA  20 mEq Oral Daily   Continuous Infusions: . sodium chloride 75 mL/hr at 09/29/18 1310  . aztreonam 1 g (09/29/18 1349)  . metronidazole 500 mg (09/29/18 1000)     LOS: 3 days    Time  spent: 25 minutes.     Hosie Poisson, MD [facility] Pager [phone]  If 7PM-7AM, please contact night-coverage [email or web address] Password [facility] 09/29/2018, 6:52 PM

## 2018-09-29 NOTE — Progress Notes (Signed)
Daily Progress Note   Patient Name: Carly [name]       Date: 09/29/2018 DOB: 01-08-1941  Age: 78 y.o. MRN#: [id number] Attending Physician: Carly Poisson, MD Primary Care Physician: Carly Amel, MD Admit Date: 09/25/2018  Reason for Consultation/Follow-up: Establishing goals of care  Subjective: I saw and examined [name] today.  She denies needs today and only request is for medication to relax prior to transport to residential hospice.  Continues to have only sips of liquids for comfort (cranberry juice and Pepsi Zero).  I called and was able to reach hospice liaison who reports that they have received and are processing records for eligibility for Hospice Home in [facility].  I let family know that information was received and is being reviewed.    Questions and concerns addressed.   PMT will continue to support holistically.  Length of Stay: 3  Current Medications: Scheduled Meds:  . eltrombopag  100 mg Oral Daily  . feeding supplement (ENSURE ENLIVE)  237 mL Oral BID BM  . levothyroxine  25 mcg Oral Q0600  . Netarsudil-Latanoprost  1 drop Both Eyes QHS  . nystatin  5 mL Oral QID  . pantoprazole  40 mg Oral Daily  . potassium chloride SA  20 mEq Oral Daily    Continuous Infusions: . sodium chloride 75 mL/hr at 09/29/18 0200  . aztreonam 1 g (09/29/18 0510)  . metronidazole 500 mg (09/29/18 1000)    PRN Meds: acetaminophen, ALPRAZolam, diphenoxylate-atropine, fentaNYL (SUBLIMAZE) injection, HYDROcodone-acetaminophen, hyoscyamine, LORazepam, magic mouthwash w/lidocaine, ondansetron, prochlorperazine, sodium chloride flush, traZODone  Physical Exam         General: Alert, awake, in no acute distress. Frail and chronically ill-appearing. Heart: Tachycardic. No  murmur appreciated. Lungs: Decreased air movement, clear Abdomen: Soft, nontender, nondistended, positive bowel sounds.  Ext: No significant edema Skin: Warm and dry Neuro: Grossly intact, nonfocal.  Vital Signs: BP 123/65 (BP Location: Right Arm)   Pulse 96   Temp 98.7 F (37.1 C) (Oral)   Resp 20   Ht 5\' 2"  (1.575 m)   Wt 63 kg   SpO2 96%   BMI 25.40 kg/m  SpO2: SpO2: 96 % O2 Device: O2 Device: Room Air O2 Flow Rate: O2 Flow Rate (L/min): 0 L/min  Intake/output summary:   Intake/Output Summary (Last 24 hours)  at 09/29/2018 1003 Last data filed at 09/29/2018 0920 Gross per 24 hour  Intake 1507.19 ml  Output 1050 ml  Net 457.19 ml   LBM: Last BM Date: 09/24/18 Baseline Weight: Weight: 63 kg Most recent weight: Weight: 63 kg       Palliative Assessment/Data:    Flowsheet Rows     Most Recent Value  Intake Tab  Referral Department  Oncology  Unit at Time of Referral  Med/Surg Unit  Palliative Care Primary Diagnosis  Cancer  Date Notified  09/26/18  Palliative Care Type  New Palliative care  Reason for referral  Clarify Goals of Care  Date of Admission  09/25/18  Date first seen by Palliative Care  09/27/18  # of days Palliative referral response time  1 Day(s)  # of days IP prior to Palliative referral  1  Clinical Assessment  Palliative Performance Scale Score  30%  Psychosocial & Spiritual Assessment  Palliative Care Outcomes  Patient/Family meeting held?  Yes  Who was at the meeting?  Patient  Palliative Care Outcomes  Changed CPR status      Patient Active Problem List   Diagnosis Date Noted  . Hypertension   . Thrombocytopenia (Carly [name])   . FTT (failure to thrive) in adult   . PNA (pneumonia)   . Malignant obstructive jaundice (Carly [name]) 09/25/2018  . 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 Genetic testing 08/15/2017  . Family history of breast cancer   . Port-A-Cath in place 07/19/2017  . Goals of care, counseling/discussion  06/29/2017  . Malignant neoplasm of pancreas (Carly [name]) 06/13/2017  . Newly diagnosed diabetes (Carly [name]) 04/17/2017  . Osteopenia 03/27/2014  . History of uterine cancer 03/26/2013  . Cellulitis and abscess of hand 09/17/2011  . Dog bite(E906.0) 09/17/2011  . Hypothyroidism 09/17/2011  . HYPERLIPIDEMIA 03/16/2007  . ALLERGIC AND NONALLERGIC RHINITIS 03/16/2007  . 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 female  with past medical history of metastatic pancreatic cancer admitted on 09/25/2018 with malignant obstructive jaundice, tachycardia and pneumonia.  Assessment: Patient Active Problem List   Diagnosis Date Noted  . Hypertension   . Thrombocytopenia (Carly [name])   . FTT (failure to thrive) in adult   . PNA (pneumonia)   . Malignant obstructive jaundice (Carly [name]) 09/25/2018  . 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 Genetic testing 08/15/2017  . Family history of breast cancer   . Port-A-Cath in place 07/19/2017  . Goals of care, counseling/discussion 06/29/2017  . Malignant neoplasm of pancreas (Carly [name]) 06/13/2017  . Newly diagnosed diabetes (Carly [name]) 04/17/2017  . Osteopenia 03/27/2014  . History of uterine cancer 03/26/2013  . Cellulitis and abscess of hand 09/17/2011  . Dog bite(E906.0) 09/17/2011  . Hypothyroidism 09/17/2011  . HYPERLIPIDEMIA 03/16/2007  . ALLERGIC AND NONALLERGIC RHINITIS 03/16/2007  . 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  I anticipate this will continue to be an issue moving forward that will require more titration.  Anxiety: Requested medicine to "relax and sleep" when time to transfer to residential hospice.  I placed and order for 1mg  Ativan to be given 20 minutes prior to transport.  GOC: Patient expressed desire to transition to residential hospice for end of life care.  She has end stage pancreatic cancer  with obstruction and bilirubin starting to climb again despite drain placement.  She has very poor nutrition and has only  taken in some cranberry juice and Pepsi in the last 24 hours.  Reports not even peach cobbler (her favorite) sounds good anymore.  She is still receiving antibiotics (HCAP and ? abdominal source) but understands that these will be discontinued on discharge.  Discussed stopping now, but she is hopeful to be able to see family once she arrives at hospice ([place] visitor restrictions prohibit visitation in the hospital) and will therefore continue these for now. I did stop other interventions such as labs, insulin coverage, and decreased pill burden.    Goals of Care and Additional Recommendations:  Limitations on Scope of Treatment: Transition to comfort care with goal of residential hospice  Code Status:    Code Status Orders  (From admission, onward)         Start     Ordered   09/27/18 1744  Do not attempt resuscitation (DNR)  Continuous    Question Answer Comment  In the event of cardiac or respiratory ARREST Do not call a "code blue"   In the event of cardiac or respiratory ARREST Do not perform Intubation, CPR, defibrillation or ACLS   In the event of cardiac or respiratory ARREST Use medication by any route, position, wound care, and other measures to relive pain and suffering. May use oxygen, suction and manual treatment of airway obstruction as needed for comfort.      09/27/18 1743        Code Status History    Date Active Date Inactive Code Status Order ID Comments User Context   09/18/2011 0508 09/20/2011 1934 Full Code [id number]  Montey Hora, RN Inpatient   Advance Care Planning Activity    Advance Directive Documentation     Most Recent Value  Type of Advance Directive  Healthcare Power of Attorney, Living will  Pre-existing out of facility DNR order (yellow form or pink MOST form)  -  "MOST" Form in Place?  -       Prognosis:   < 2 weeks  most likely with shift to full comfort.  See [place] [name] above.   Discharge Planning:  Hospice facility- D/w hospice liaison.  Greatly appreciate assistance.  Care plan was discussed with patient, family, Dr. Burr Medico, Dr. Karleen Hampshire  Thank you for allowing the Palliative Medicine Team to assist in the care of this patient.   Time In: 0940 Time Out: 1005 Total Time 25 Prolonged Time Billed no      Greater than 50%  of this time was spent counseling and coordinating care related to the above assessment and plan.  Micheline Rough, MD  Please contact Palliative Medicine Team phone at [phone] for questions and concerns.

## 2018-09-30 ENCOUNTER — Other Ambulatory Visit: Payer: Medicare Other

## 2018-09-30 ENCOUNTER — Inpatient Hospital Stay: Payer: Medicare Other | Admitting: Nutrition

## 2018-09-30 ENCOUNTER — Ambulatory Visit: Payer: Medicare Other | Admitting: Nurse Practitioner

## 2018-09-30 ENCOUNTER — Ambulatory Visit: Payer: Medicare Other

## 2018-09-30 DIAGNOSIS — R0600 Dyspnea, unspecified: Secondary | ICD-10-CM

## 2018-09-30 NOTE — Plan of Care (Addendum)
Pt was taken to [facility] via [facility]. Report was given to nurse at [phone] and package was sent to [facility] with [facility]. Family had no questions.

## 2018-09-30 NOTE — Care Management Important Message (Signed)
Important Message  Patient Details IM Letter given to Velva Harman RN to present to the Patient Name: Carly [name] MRN: [id number] Date of Birth: [birth year]   Medicare Important Message Given:  Yes     Kerin Salen 09/30/2018, 10:54 AM

## 2018-09-30 NOTE — Discharge Summary (Signed)
Physician Discharge Summary  Carly [name]:[id number] DOB: 19-Mar-1940 DOA: 09/25/2018  PCP: [name], MD  Admit date: 09/25/2018 Discharge date: 09/30/2018  Admitted From:[facility].  Disposition: [facility].   Recommendations for Outpatient Follow-up:  Follow up with hospice MD as recommended.    Discharge Condition:hospice.  CODE STATUS:comfort care Diet recommendation: comfort feeds.   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 Burr Medico, recent chemo on Monday (Abraxane)who was admitted on 09/25/2018 by IR for internalization/stenting of biliaryDrain.  Patient was admitted overnight for observation, with  Iv fluids, IV antibiotics, post procedure patient was noticed to be tachycardic and tachypnea, tempeture at 99.Patient was evaluated the morning of 7/30  by Dr.McCullough,and patient was not considered to be stable to be discharged. Patient continue tobe tachycardic, mild tachypneic and with generalized weakness. She was admitted to [facility] service for further evaluation. Oncology consulted and recommendations given.   Palliative care consulted and transition to comfort care.   Discharge Diagnoses:  Active Problems:   Hypothyroidism   Malignant neoplasm of pancreas (HCC)   Malignant obstructive jaundice (HCC)   Hypertension   Thrombocytopenia (HCC)   FTT (failure to thrive) in adult   PNA (pneumonia)  Malignant obstructive Jaundice secondary to  adenocarcinoma of the pancreas: - continue with aztreonam.for now.  Appreciate oncology and IR recommendations.  Pt continues to be jaundiced, ill appearing and deteriorating. The bilirubin continues to rise.   Called palliative care consult for goals of care and possible transition to hospice.  After discussion with family, plan for residential hospice.     Health care associated pneumonia:  Completed 5 days of IV  antibiotics.    Thrombocytopenia:  No bleeding so far.     Hypotension:  Resolved.    Anemia of chronic disease Hemoglobin stable around 8.    Tachycardia:  Possibly from the disease process.      Discharge Instructions  Discharge Instructions    Discharge instructions   Complete by: As directed    Please follow up with Hospice MD as recommended.     Allergies as of 09/30/2018      Reactions   Cephalexin Hives, Swelling   Nabumetone Hives, Swelling      Medication List    STOP taking these medications   atorvastatin 10 MG tablet Commonly known as: LIPITOR   CALCIUM PO   cyanocobalamin 2000 MCG tablet   eltrombopag 50 MG tablet Commonly known as: Promacta   hydrochlorothiazide 25 MG tablet Commonly known as: HYDRODIURIL   levothyroxine 25 MCG tablet Commonly known as: SYNTHROID   lidocaine-prilocaine cream Commonly known as: EMLA   loratadine 10 MG tablet Commonly known as: CLARITIN   megestrol 625 MG/5ML suspension Commonly known as: MEGACE ES   metFORMIN 500 MG tablet Commonly known as: GLUCOPHAGE   OneTouch Delica Lancets 15A Misc   OneTouch Verio test strip Generic drug: glucose blood   potassium chloride SA 20 MEQ tablet Commonly known as: Klor-Con M20   traMADol 50 MG tablet Commonly known as: ULTRAM   Vitamin D 50 MCG (2000 UT) Caps     TAKE these medications   ALPRAZolam 0.5 MG tablet Commonly known as: XANAX Take 1 tablet (0.5 mg total) by mouth at bedtime as needed for anxiety. What changed: how much to take   diphenoxylate-atropine 2.5-0.025 MG tablet Commonly known as: LOMOTIL Take 1-2 tablets by mouth 4 (four) times daily as needed for diarrhea or loose  stools.   HYDROcodone-acetaminophen 5-325 MG tablet Commonly known as: Norco Take 1 tablet by mouth every 6 (six) hours as needed for moderate pain.   hyoscyamine 0.375 MG 12 hr tablet Commonly known as: LEVBID Take 0.375 mg by mouth every 12  (twelve) hours as needed for cramping.   magic mouthwash w/lidocaine Soln Take 5 mLs by mouth 3 (three) times daily as needed for mouth pain.   omeprazole 20 MG capsule Commonly known as: PRILOSEC Take 20 mg by mouth daily before breakfast.   ondansetron 8 MG tablet Commonly known as: ZOFRAN Take 1 tablet (8 mg total) by mouth every 8 (eight) hours as needed for nausea or vomiting.   prochlorperazine 10 MG tablet Commonly known as: COMPAZINE Take 1 tablet (10 mg total) by mouth every 6 (six) hours as needed for nausea or vomiting.   Rocklatan 0.02-0.005 % Soln Generic drug: Netarsudil-Latanoprost Place 1 drop into both eyes at bedtime.   traZODone 100 MG tablet Commonly known as: DESYREL TAKE 1 TABLET BY MOUTH EVERYDAY AT BEDTIME What changed: See the new instructions.       Allergies  Allergen Reactions  . Cephalexin Hives and Swelling  . Nabumetone Hives and Swelling    Consultations:  Palliative   Oncology.    Procedures/Studies: US Abdomen Complete  Result Date: 09/05/2018 CLINICAL DATA:  78 year old with hyperbilirubinemia. 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 EXAM: ABDOMEN ULTRASOUND COMPLETE COMPARISON:  CT abdomen and pelvis 08/14/2018 and earlier. FINDINGS: Gallbladder: Layering echogenic sludge. No shadowing gallstones. No gallbladder wall thickening or pericholecystic fluid. Negative sonographic Murphy's sign according to the ultrasound technologist. Common bile duct: Diameter: Approximately 10 mm. The bile duct is obstructed by the mass involving the pancreatic head described below. Liver: Moderate intrahepatic biliary ductal dilation, new since the CT 3 weeks ago. Multiple hepatic parenchymal masses, better demonstrated on the prior CT. Geographic areas of hepatic steatosis. Portal vein is patent on color Doppler imaging with normal direction of blood flow towards the liver. IVC: Patent. Pancreas: Large mixed cystic and solid mass  involving the head of the pancreas as noted on the prior CT, measuring at least 6.4 x 6.4 x 4.6 cm, more accurately sized on CT. Spleen: Borderline to mild splenomegaly with a volume of approximately 421 mL. No focal splenic parenchymal abnormality. Right Kidney: Length: Approximate 11.7 cm. No hydronephrosis. Well-preserved cortex. No shadowing calculi. Normal parenchymal echotexture. No focal parenchymal abnormality. Left Kidney: Length: Approximately 10.8 cm. 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 Well-preserved cortex. No shadowing calculi. Normal parenchymal echotexture. No focal parenchymal abnormality. Abdominal aorta: Normal in caliber throughout its visualized course in the abdomen with evidence of atherosclerosis. Maximum diameter 1.4 cm. Other findings: None. IMPRESSION: Since the CT abdomen and pelvis 3 weeks ago: 1. Interval development of moderate intrahepatic biliary ductal dilation. The common bile duct is dilated up to approximately 10 mm in diameter. 2. Mixed cystic and solid mass involving the head of the pancreas measuring just over 6 cm on ultrasound. Sizing is more accurate on CT. 3. Multiple hepatic metastases as noted on CT. 4. Stable borderline to mild splenomegaly. 5. 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 Electronically Signed   By: Evangeline Dakin M.D.   On: 09/05/2018 14:21   Ct Abdomen Pelvis W Contrast  Result Date: 09/18/2018 CLINICAL DATA:  Pancreatic cancer. EXAM: CT ABDOMEN AND PELVIS WITH CONTRAST TECHNIQUE: Multidetector CT imaging  of the abdomen and pelvis was performed using the standard protocol following bolus administration of intravenous contrast. CONTRAST:  113m OMNIPAQUE IOHEXOL 300 MG/ML  SOLN COMPARISON:  04/02/2018 FINDINGS: Lower chest: Basilar atelectasis bilaterally. Hepatobiliary: 4.1 cm metastatic lesion in the dome of the liver has increased from 2.8 cm  previously. 3.4 cm lesion posterior right liver (26/3) has increased from 2.3 cm previously. 9 mm metastatic lesion in the inferior right liver adjacent to the gallbladder fossa (27/3) is stable. 11 mm lesion posterior right liver on 17/3 was 9 mm previously. Interval development of moderate intrahepatic biliary duct dilatation. Extrahepatic common duct measures up to 17 mm diameter with abrupt termination in the head of pancreas. Gallbladder unremarkable. Pancreas: Marked interval progression of the heterogeneous low-density pancreatic head mass, now measuring 8.1 x 6.6 cm (35/3) compared to 5.2 x 4.3 cm previously. As noted above, this obstructs the common bile duct in the head of the pancreas. Mass lesion generates substantial mass-effect on the gastric antrum and duodenum. Pancreatic body and talar atrophic with some dilatation of the main duct. Spleen: Mild splenomegaly. Adrenals/Urinary Tract: No adrenal nodule or mass. No enhancing mass lesion noted in either kidney. No substantial hydroureteronephrosis. The urinary bladder appears normal for the degree of distention. Stomach/Bowel: Decompressed. Marked mass-effect on the distal stomach due to the enlarging pancreatic head mass. Duodenum is nondilated. No small bowel wall thickening. No small bowel dilatation. The terminal ileum is normal. The appendix is normal. No gross colonic mass. No colonic wall thickening. Diverticular changes are noted in the left colon without evidence of diverticulitis. Vascular/Lymphatic: There is abdominal aortic atherosclerosis without aneurysm. 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 reconstitutes with extensive collateralization in the left abdomen and markedly prominent left renal vein. No para-aortic lymphadenopathy. No pelvic sidewall lymphadenopathy. Reproductive: The uterus is surgically absent. There is no adnexal mass. Other: Interval development of multiple  soft tissue nodules in the perigastric fat around the antrum with apparent extension into the omentum. 13 mm perigastric nodule visible on 43/5. Omental nodularity visible on 38/5. No intraperitoneal free fluid. Musculoskeletal: No worrisome lytic or sclerotic osseous abnormality. IMPRESSION: 1. Marked interval progression of the pancreatic head mass in the 4 week interval since prior study. Lesion measures up to 8.1 cm diameter today compared to 5.2 cm previously. Lesion generates substantial mass-effect on the posterior wall gastric antrum. 2. New metastatic nodularity in the perigastric fat, left omentum, and transverse mesocolon. 3. Interval development of moderate to severe intra and extrahepatic biliary duct dilatation with obliteration of the common bile duct in the head of the pancreas by the tumor. Correlation with liver function test recommended. 4. Interval progression of known liver metastases. 5. 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 6.  Aortic Atherosclerois (ICD10-170.0) These results will be called to the ordering clinician or representative by the Radiologist Assistant, and communication documented in the PACS or zVision Dashboard. Electronically Signed   By: EMisty StanleyM.D.   On: 09/18/2018 10:12   Dg Chest Port 1 View  Result Date: 09/26/2018 CLINICAL DATA:  Shortness of breath EXAM: PORTABLE CHEST 1 VIEW COMPARISON:  Chest radiograph 08/02/2010 FINDINGS: Right anterior chest wall Port-A-Cath is present with tip projecting over the superior vena cava. Enlarged cardiac and mediastinal contours. Aortic atherosclerosis. Mild left pleural effusion with underlying consolidation. No pneumothorax. IMPRESSION: Small left pleural effusion with underlying consolidation left lower hemithorax. Electronically Signed  By: Lovey Newcomer M.D.   On: 09/26/2018 12:25   Dg C-arm 1-60 Min  Result Date:  09/11/2018 CLINICAL DATA:  Attempted ERCP. 6 seconds of fluoroscopy. Unable to cannulate wire past the ambulance. EXAM: DG C-ARM 61-120 MIN COMPARISON:  Ultrasound, 09/05/2018. FINDINGS: Single submitted image shows the endoscopy scope projecting in the duodenum. IMPRESSION: Single portable fluoroscopic image from an attempted ERCP. Electronically Signed   By: Lajean Manes M.D.   On: 09/11/2018 14:30   Ir Int Lianne Cure Biliary Drain With Cholangiogram  Result Date: 09/18/2018 INDICATION: 78 year old female with pancreatic cancer and malignant obstructed jaundice. She presents for percutaneous transhepatic cholangiogram and biliary drain placement. EXAM: PTC with internal/external biliary drain placement MEDICATIONS: 20 mg aztreonam, 500 mg Flagyl; The antibiotic was administered within an appropriate time frame prior to the initiation of the procedure. ANESTHESIA/SEDATION: Moderate (conscious) sedation was employed during this procedure. A total of Versed 4 mg and Fentanyl 100 mcg was administered intravenously. Moderate Sedation Time: 37 minutes. The patient's level of consciousness and vital signs were monitored continuously by radiology nursing throughout the procedure under my direct supervision. FLUOROSCOPY TIME:  Fluoroscopy Time: 12 minutes 54 seconds (238 mGy). COMPLICATIONS: None immediate. PROCEDURE: Informed written consent was obtained from the patient after a thorough discussion of the procedural risks, benefits and alternatives. All questions were addressed. Maximal Sterile Barrier Technique was utilized including caps, mask, sterile gowns, sterile gloves, sterile drape, hand hygiene and skin antiseptic. A timeout was performed prior to the initiation of the procedure. The right upper quadrant was interrogated with ultrasound. There is not a suitable window for ultrasound-guided percutaneous transhepatic cholangiogram. Therefore, an intercostal space in the mid aspect of the hepatic shadow in the mid  axillary line was identified. Local anesthesia was attained by infiltration with 1% lidocaine. Under fluoroscopic guidance, a 21 gauge Chiba needle was advanced into the liver. The needle was pulled back in till the tip was within a biliary radicle. A percutaneous transhepatic cholangiogram was then performed. There is significant dilation of the common hepatic and common bile ducts. No contrast material passes into the duodenum consistent with a complete malignant obstruction. The central intra and extrahepatic biliary ducts are widely dilated. The peripheral radicles are moderately dilated. A suitable peripheral radical of the right posterior biliary tree was localized. Using a second 21 gauge Chiba needle, the needle was used to puncture the bile duct under fluoroscopic guidance. A 0.018 wire was then advanced into the biliary radicle and further on into the common bile duct. The needle was removed. An Accustick sheath was then used to dilate the transhepatic tract. A road runner wire was then introduced through the Accustick sheath. The wire was successfully navigated through the obstruction and into the duodenum. The Accustick sheath was removed. The transhepatic tract was then dilated to 10 [place] using a soft tissue dilator. A Cook 27 [place] biliary drainage catheter was then advanced over the wire and formed with the locking loop in the duodenum and the proximal side hole in the common hepatic duct. There is excellent drainage of the biliary tree. A final contrast injection confirms catheter location. The catheter was secured to the skin with 0 Prolene suture. The catheter was connected to bag drainage. IMPRESSION: 1. Successful percutaneous transhepatic cholangiogram demonstrating complete malignant obstruction of the distal common bile duct and moderate biliary ductal dilatation. 2. Successful placement of a 10 French internal/external percutaneous biliary drain. The drain was left to gravity bag. PLAN: 1.  Admit overnight for  observation. 2. Maintained to bag drainage overnight. 3. If doing well in the morning, drain can be capped. 4. Return to interventional Radiology in approximately 1 week for stent placement. Signed, Criselda Peaches, MD, [place] [facility] Radiology Electronically Signed   By: Jacqulynn Cadet M.D.   On: 09/18/2018 15:10   Ir Biliary Stent(s) Existing Access [employer] Dilation Cath Exchange  Result Date: 09/25/2018 INDICATION: 77 year old female with advanced pancreatic cancer and malignant obstructed jaundice. She underwent percutaneous transhepatic cholangiogram and biliary drain placement last week. She presents today for placement of a palliative covered stent. EXAM: 1. Biliary stent placement. 2. Biliary drain exchange. MEDICATIONS: 500 mg Flagyl and 500 mg aztreonam; The antibiotic was administered within an appropriate time frame prior to the initiation of the procedure. ANESTHESIA/SEDATION: Moderate (conscious) sedation was employed during this procedure. A total of Versed 1 mg and Fentanyl 50 mcg was administered intravenously. Moderate Sedation Time: 38 minutes. The patient's level of consciousness and vital signs were monitored continuously by radiology nursing throughout the procedure under my direct supervision. FLUOROSCOPY TIME:  Fluoroscopy Time: 9 minutes 48 seconds (160 mGy). COMPLICATIONS: SIR LEVEL B - Normal therapy, includes overnight admission for observation. Patient developed low-grade fever, tachycardia and tachypnea during recovery following the procedure. She was admitted for observation. PROCEDURE: Informed written consent was obtained from the patient after a thorough discussion of the procedural risks, benefits and alternatives. All questions were addressed. Maximal Sterile Barrier Technique was utilized including caps, mask, sterile gowns, sterile gloves, sterile drape, hand hygiene and skin antiseptic. A timeout was  performed prior to the initiation of the procedure. Local anesthesia was attained around the existing drain exit site using 1% lidocaine. The retention suture was cut. The drain was transected and removed over a Bentson wire. An 8 French sheath was then advanced over the wire and into the duodenum. A pull-back, over the wire cholangiogram was then performed through the sheath. 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 The duodenum remains patent with good peristalsis. Biliary ductal dilatation has improved following placement of the internal/external biliary drainage catheter. A 5 French angled catheter was advanced over the wire into the duodenum. The Bentson wire was exchanged for a superstiff Amplatz wire which was advanced further into the duodenum. The angiographic catheter was removed. The 8 French sheath was removed and exchanged for a 9 French sheath which was advanced into the duodenum. A 10 x 60 wall flex covered stent was advanced through the sheath and across the stenosis. The stent was then successfully deployed across the stenosis. Post dilation was performed using an 8 x 40 mm Conquest balloon. Contrast injection confirms patency of the biliary tree. The distal aspect of the stent is just within the duodenum. A new 10 [place] biliary drain was advanced over the wire and formed with the locking loop in the duodenum. The drain was capped and secured to the skin with 0 Prolene suture. The patient tolerated the procedure well. IMPRESSION: Successful placement of a 10 x 60 wall flex covered stent across the malignant common bile duct stricture. Successful exchange for a new 10 French internal/external biliary drainage catheter. PLAN: 1. Admit for observation. 2. If patient doing well tomorrow, drain to be capped prior to discharge. 3. Patient to return Interventional Radiology in 4-6 weeks for cholangiogram and possible removal of biliary drain if stent is  functioning adequately. Signed, Criselda Peaches, MD, [facility] Radiology Electronically Signed   By: Jacqulynn Cadet M.D.   On: 09/25/2018 18:51       Subjective: No complaints.   Discharge Exam: Vitals:   09/29/18 2251 09/30/18 0513  BP: 123/68 134/73  Pulse: (!) 104 94  Resp: 20 18  Temp: 98.4 F (36.9 C) 98 F (36.7 C)  SpO2: 96% 95%   Vitals:   09/28/18 2230 09/29/18 0515 09/29/18 2251 09/30/18 0513  BP: (!) 114/57 123/65 123/68 134/73  Pulse: 100 96 (!) 104 94  Resp: (!) '22 20 20 18  ' Temp: 98.4 F (36.9 C) 98.7 F (37.1 C) 98.4 F (36.9 C) 98 F (36.7 C)  TempSrc: Oral Oral Oral Oral  SpO2: 96% 96% 96% 95%  Weight:      Height:        General: Pt is alert, awake, not in acute distress Cardiovascular: RRR, S1/S2 +, no rubs, no gallops     The results of significant diagnostics from this hospitalization (including imaging, microbiology, ancillary and laboratory) are listed below for reference.     Microbiology: Recent Results (from the past 240 hour(s))  Urine Culture     Status: Abnormal   Collection Time: 09/26/18  1:22 PM   Specimen: Urine, Clean Catch  Result Value Ref Range Status   Specimen Description   Final    URINE, CLEAN CATCH Performed at [facility], [place] [address], [place],  [address]    Special Requests   Final    NONE Performed at [facility], [place] [address] [place], [place] [address]    Culture 20,000 COLONIES/mL ENTEROCOCCUS FAECALIS (A)  Final   Report Status 09/28/2018 FINAL  Final   Organism ID, Bacteria ENTEROCOCCUS FAECALIS (A)  Final      Susceptibility   Enterococcus faecalis - MIC*    AMPICILLIN <=2 SENSITIVE Sensitive     LEVOFLOXACIN 1 SENSITIVE Sensitive     NITROFURANTOIN <=16 SENSITIVE Sensitive     VANCOMYCIN 1 SENSITIVE Sensitive     * 20,000 COLONIES/mL ENTEROCOCCUS FAECALIS     Labs: BNP (last 3  results) No results for input(s): BNP in the last 8760 hours. Basic Metabolic Panel: Recent Labs  Lab 09/25/18 1350 09/26/18 0557 09/27/18 0350  NA 135 137 139  K 4.4 4.2 4.1  CL 101 106 110  CO2 '22 23 23  ' GLUCOSE 264* 172* 119*  BUN 40* 41* 39*  CREATININE 1.17* 1.02* 0.88  CALCIUM 9.1 8.6* 8.5*   Liver Function Tests: Recent Labs  Lab 09/25/18 1350 09/26/18 0557 09/27/18 0350  AST 81* 63* 43*  ALT 56* 48* 37  ALKPHOS 356* 313* 220*  BILITOT 3.9* 3.6* 4.0*  PROT 6.0* 5.5* 5.2*  ALBUMIN 2.0* 1.7* 1.6*   No results for input(s): LIPASE, AMYLASE in the last 168 hours. Recent Labs  Lab 09/26/18 1421  AMMONIA 40*   CBC: Recent Labs  Lab 09/25/18 1350 09/26/18 0557 09/27/18 0350  WBC 7.8 5.0 4.1  NEUTROABS 7.3  --   --   HGB 8.4* 8.3* 8.0*  HCT 26.1* 25.8* 25.1*  MCV 98.1 98.5 98.0  PLT 30* 33* 33*   Cardiac Enzymes: No results for input(s): CKTOTAL, CKMB, CKMBINDEX, TROPONINI in the last 168 hours. BNP: Invalid input(s): POCBNP CBG: Recent Labs  Lab 09/27/18 1536 09/27/18 2143 09/27/18 2332 09/28/18 0409 09/28/18 0744  GLUCAP 150* 182* 152* 130* 89   D-Dimer No results for input(s): DDIMER in the last 72 hours.  Hgb A1c No results for input(s): HGBA1C in the last 72 hours. Lipid Profile No results for input(s): CHOL, HDL, LDLCALC, TRIG, CHOLHDL, LDLDIRECT in the last 72 hours. Thyroid function studies No results for input(s): TSH, T4TOTAL, T3FREE, THYROIDAB in the last 72 hours.  Invalid input(s): FREET3 Anemia work up No results for input(s): VITAMINB12, FOLATE, FERRITIN, TIBC, IRON, RETICCTPCT in the last 72 hours. Urinalysis    Component Value Date/Time   COLORURINE AMBER (A) 09/26/2018 1322   APPEARANCEUR HAZY (A) 09/26/2018 1322   LABSPEC 1.026 09/26/2018 1322   PHURINE 5.0 09/26/2018 1322   GLUCOSEU NEGATIVE 09/26/2018 1322   HGBUR SMALL (A) 09/26/2018 1322   BILIRUBINUR SMALL (A) 09/26/2018 1322   KETONESUR NEGATIVE 09/26/2018  1322   PROTEINUR 100 (A) 09/26/2018 1322   NITRITE NEGATIVE 09/26/2018 1322   LEUKOCYTESUR SMALL (A) 09/26/2018 1322   Sepsis Labs Invalid input(s): PROCALCITONIN,  WBC,  LACTICIDVEN Microbiology Recent Results (from the past 240 hour(s))  Urine Culture     Status: Abnormal   Collection Time: 09/26/18  1:22 PM   Specimen: Urine, Clean Catch  Result Value Ref Range Status   Specimen Description   Final    URINE, CLEAN CATCH Performed at [facility], [place] [address], [place], [place] [address]    Special Requests   Final    NONE Performed at [facility], [place] [address], [place], [place] [address]    Culture 20,000 COLONIES/mL ENTEROCOCCUS FAECALIS (A)  Final   Report Status 09/28/2018 FINAL  Final   Organism ID, Bacteria ENTEROCOCCUS FAECALIS (A)  Final      Susceptibility   Enterococcus faecalis - MIC*    AMPICILLIN <=2 SENSITIVE Sensitive     LEVOFLOXACIN 1 SENSITIVE Sensitive     NITROFURANTOIN <=16 SENSITIVE Sensitive     VANCOMYCIN 1 SENSITIVE Sensitive     * 20,000 COLONIES/mL ENTEROCOCCUS FAECALIS     Time coordinating discharge: Over 32 minutes  SIGNED:   Hosie Poisson, MD  [facility] 09/30/2018, 11:56 AM Pager   If 7PM-7AM, please contact night-coverage [email or web address] Password [email or web address]

## 2018-09-30 NOTE — Progress Notes (Signed)
Daily Progress Note   Patient Name: Carly [name]       Date: 09/30/2018 DOB: 1940/09/20  Age: 78 y.o. MRN#: [id number] Attending Physician: No att. providers found Primary Care Physician: [name], MD Admit Date: 09/25/2018  Reason for Consultation/Follow-up: Establishing goals of care  Subjective: I saw and examined [name] today.    Plan is for transition to hospice home in [facility] for end-of-life care.  She denies any needs or complaints today.  She did request again to ensure that she has given something prior to discharge to help her relax-"just knock me out"- on ride to hospice facility.  Questions and concerns addressed.   PMT will continue to support holistically.  Length of Stay: 4  Current Medications: Scheduled Meds:  . eltrombopag  100 mg Oral Daily  . feeding supplement (ENSURE ENLIVE)  237 mL Oral BID BM  . levothyroxine  25 mcg Oral Q0600  . Netarsudil-Latanoprost  1 drop Both Eyes QHS  . nystatin  5 mL Oral QID  . pantoprazole  40 mg Oral Daily  . potassium chloride SA  20 mEq Oral Daily    Continuous Infusions: . sodium chloride 75 mL/hr at 09/30/18 0515  . aztreonam 1 g (09/30/18 0513)  . metronidazole 500 mg (09/30/18 0929)    PRN Meds: acetaminophen, ALPRAZolam, diphenoxylate-atropine, fentaNYL (SUBLIMAZE) injection, HYDROcodone-acetaminophen, hyoscyamine, magic mouthwash w/lidocaine, ondansetron, prochlorperazine, sodium chloride flush, traZODone  Physical Exam         General: Alert, awake, in no acute distress. Frail and chronically ill-appearing. Heart: Tachycardic. No murmur appreciated. Lungs: Decreased air movement, clear Abdomen: Soft, nontender, nondistended, positive bowel sounds.  Ext: No significant edema Skin: Warm and  dry Neuro: Grossly intact, nonfocal.  Vital Signs: BP 134/73 (BP Location: Right Arm)   Pulse 94   Temp 98 F (36.7 C) (Oral)   Resp 18   Ht 5\' 2"  (1.575 m)   Wt 63 kg   SpO2 95%   BMI 25.40 kg/m  SpO2: SpO2: 95 % O2 Device: O2 Device: Room Air O2 Flow Rate: O2 Flow Rate (L/min): 0 L/min  Intake/output summary:   Intake/Output Summary (Last 24 hours) at 09/30/2018 1508 Last data filed at 09/30/2018 1105 Gross per 24 hour  Intake 1313.97 ml  Output 300 ml  Net 1013.97 ml  LBM: Last BM Date: 09/24/18 Baseline Weight: Weight: 63 kg Most recent weight: Weight: 63 kg       Palliative Assessment/Data:    Flowsheet Rows     Most Recent Value  Intake Tab  Referral Department  Oncology  Unit at Time of Referral  Med/Surg Unit  Palliative Care Primary Diagnosis  Cancer  Date Notified  09/26/18  Palliative Care Type  New Palliative care  Reason for referral  Clarify Goals of Care  Date of Admission  09/25/18  Date first seen by Palliative Care  09/27/18  # of days Palliative referral response time  1 Day(s)  # of days IP prior to Palliative referral  1  Clinical Assessment  Palliative Performance Scale Score  30%  Psychosocial & Spiritual Assessment  Palliative Care Outcomes  Patient/Family meeting held?  Yes  Who was at the meeting?  Patient  Palliative Care Outcomes  Changed CPR status      Patient Active Problem List   Diagnosis Date Noted  . Hypertension   . Thrombocytopenia ([place])   . FTT (failure to thrive) in adult   . PNA (pneumonia)   . 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 Genetic testing 08/15/2017  . Family history of breast cancer   . Port-A-Cath in place 07/19/2017  . Goals of care, counseling/discussion 06/29/2017  . Malignant neoplasm of pancreas ([place]) 06/13/2017  . Newly diagnosed diabetes ([place]) 04/17/2017  . Osteopenia 03/27/2014  . History of uterine cancer  03/26/2013  . Cellulitis and abscess of hand 09/17/2011  . Dog bite(E906.0) 09/17/2011  . Hypothyroidism 09/17/2011  . HYPERLIPIDEMIA 03/16/2007  . ALLERGIC AND NONALLERGIC RHINITIS 03/16/2007  . 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 female  with past medical history of metastatic pancreatic cancer admitted on 09/25/2018 with malignant obstructive jaundice, tachycardia and pneumonia.  Assessment: Patient Active Problem List   Diagnosis Date Noted  . Hypertension   . Thrombocytopenia ([place])   . FTT (failure to thrive) in adult   . PNA (pneumonia)   . 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 Genetic testing 08/15/2017  . Family history of breast cancer   . Port-A-Cath in place 07/19/2017  . Goals of care, counseling/discussion 06/29/2017  . Malignant neoplasm of pancreas ([place]) 06/13/2017  . Newly diagnosed diabetes ([place]) 04/17/2017  . Osteopenia 03/27/2014  . History of uterine cancer 03/26/2013  . Cellulitis and abscess of hand 09/17/2011  . Dog bite(E906.0) 09/17/2011  . Hypothyroidism 09/17/2011  . HYPERLIPIDEMIA 03/16/2007  . ALLERGIC AND NONALLERGIC RHINITIS 03/16/2007  . 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  I anticipate this will continue to be an issue moving forward that will require more titration.  Anxiety: Requested medicine to "relax and sleep" when time to transfer to residential hospice.  I placed and order for 1mg  Ativan to be given 20 minutes prior to transport.  GOC: Plan to transition to residential hospice today for end-of-life care.  She does appear to me this morning to be stable enough for transport.  Goals of Care and Additional Recommendations:  Limitations on Scope of Treatment: Transition to comfort care with goal of residential hospice   Code Status:    Code Status Orders  (From admission, onward)  Start     Ordered   09/27/18 1744  Do not attempt resuscitation (DNR)  Continuous    Question Answer Comment  In the event of cardiac or respiratory ARREST Do not call a "code blue"   In the event of cardiac or respiratory ARREST Do not perform Intubation, CPR, defibrillation or ACLS   In the event of cardiac or respiratory ARREST Use medication by any route, position, wound care, and other measures to relive pain and suffering. May use oxygen, suction and manual treatment of airway obstruction as needed for comfort.      09/27/18 1743        Code Status History    Date Active Date Inactive Code Status Order ID Comments User Context   09/18/2011 0508 09/20/2011 1934 Full Code [id number]  Montey Hora, RN Inpatient   Advance Care Planning Activity    Advance Directive Documentation     Most Recent Value  Type of Advance Directive  Healthcare Power of Attorney, Living will  Pre-existing out of facility DNR order (yellow form or pink MOST form)  -  "MOST" Form in Place?  -       Prognosis:   < 2 weeks most likely with shift to full comfort.  See [place] above.   Discharge Planning:  Hospice facility  Care plan was discussed with patient, family, Dr. Burr Medico, Dr. Karleen Hampshire  Thank you for allowing the Palliative Medicine Team to assist in the care of this patient.   Time In: 0900 Time Out: 0920 Total Time 20 Prolonged Time Billed no      Greater than 50%  of this time was spent counseling and coordinating care related to the above assessment and plan.  Micheline Rough, MD  Please contact Palliative Medicine Team phone at [phone] for questions and concerns.

## 2018-09-30 NOTE — TOC Transition Note (Signed)
Transition of Care [facility]) - CM/SW Discharge Note   Patient Details  Name: Carly [name] MRN: [id number] Date of Birth: [birth year]  [facility] CM/SW Contact:  Leeroy Cha, RN Phone Number: 09/30/2018, 1:01 PM   Clinical Narrative:    1302/ptar called for transport to [facility] [place].   Final next level of care: [place] Barriers to Discharge: Continued Medical Work up, Hospice Bed not available   Patient Goals and CMS Choice Patient states their goals for this hospitalization and ongoing recovery are:: Family wants placement at [facility] facility for end of life care. CMS Medicare.gov Compare Post Acute Care list provided to:: Patient Represenative (must comment) Choice offered to / list presented to : Adult Children  Discharge Placement                       Discharge Plan and Services                                     Social Determinants of Health (SDOH) Interventions     Readmission Risk Interventions No flowsheet data found.

## 2018-10-08 ENCOUNTER — Other Ambulatory Visit: Payer: Medicare Other

## 2018-10-08 ENCOUNTER — Ambulatory Visit: Payer: Medicare Other | Admitting: Nurse Practitioner

## 2018-10-08 ENCOUNTER — Ambulatory Visit: Payer: Medicare Other

## 2018-10-14 ENCOUNTER — Other Ambulatory Visit: Payer: Medicare Other

## 2018-10-14 ENCOUNTER — Ambulatory Visit: Payer: Medicare Other

## 2018-10-14 ENCOUNTER — Ambulatory Visit: Payer: Medicare Other | Admitting: Hematology

## 2018-10-23 ENCOUNTER — Telehealth: Payer: Self-pay

## 2018-10-23 NOTE — Telephone Encounter (Signed)
Faxed signed orders back to [facility] to [phone] sent to HIM for scanning to chart.

## 2018-10-29 DEATH — deceased

## 2020-08-12 IMAGING — CT CT ABDOMEN AND PELVIS WITH CONTRAST
3 of 8 series · 15 of 46 positions shown, 17 images · IV contrast (APPLIED)
Comparison: 04/02/2018

CLINICAL DATA: Pancreatic cancer.

EXAM:
CT ABDOMEN AND PELVIS WITH CONTRAST
TECHNIQUE: Multidetector CT imaging of the abdomen and pelvis was performed
using the standard protocol following bolus administration of
intravenous contrast.
CONTRAST:  100mL OMNIPAQUE IOHEXOL 300 MG/ML  SOLN

[Series 5: axial st · axial · 0.84mm/px · z∈[-478,-88]mm · 9 of 98 slices shown, 11 images]
[im 10/98  soft-tissue]
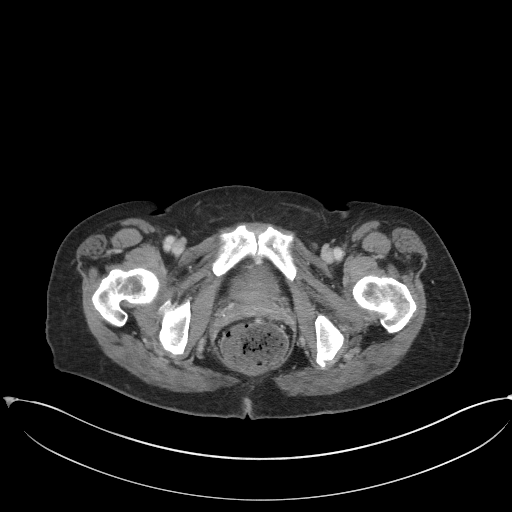
[im 10/98  bone]
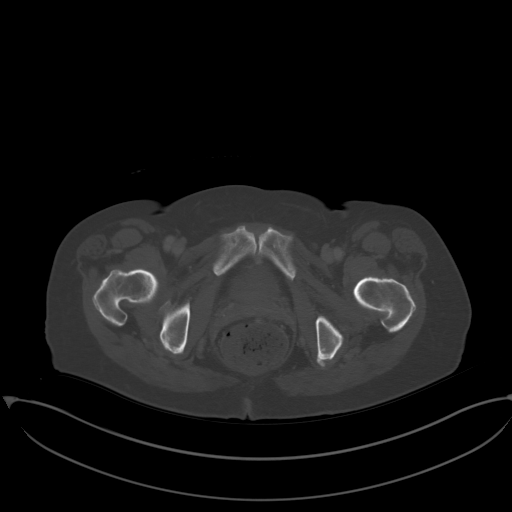
[im 20/98  soft-tissue]
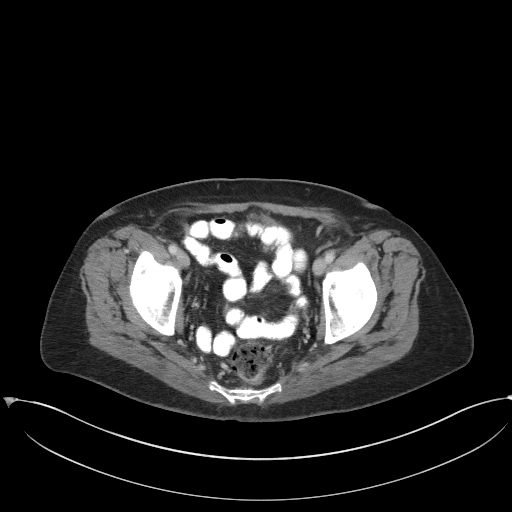
[im 30/98  soft-tissue]
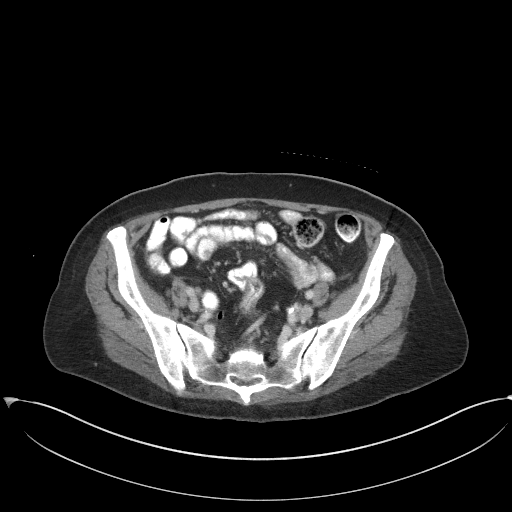
[im 39/98  soft-tissue]
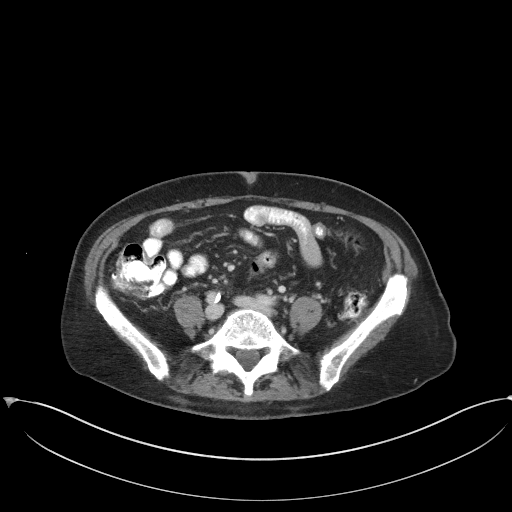
[im 49/98  soft-tissue]
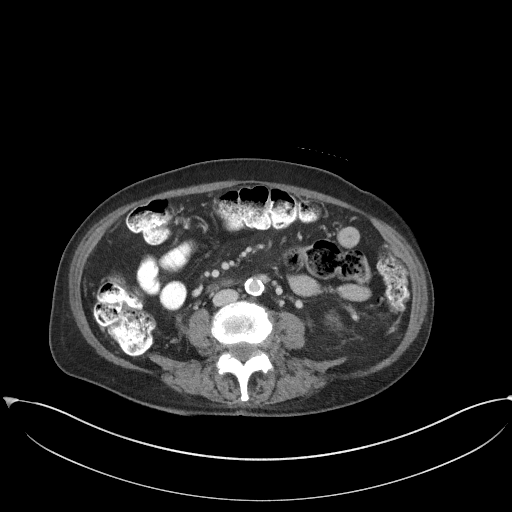
[im 59/98  soft-tissue]
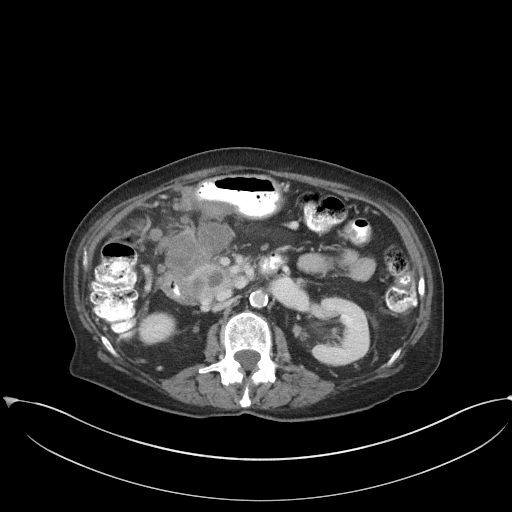
[im 68/98  soft-tissue]
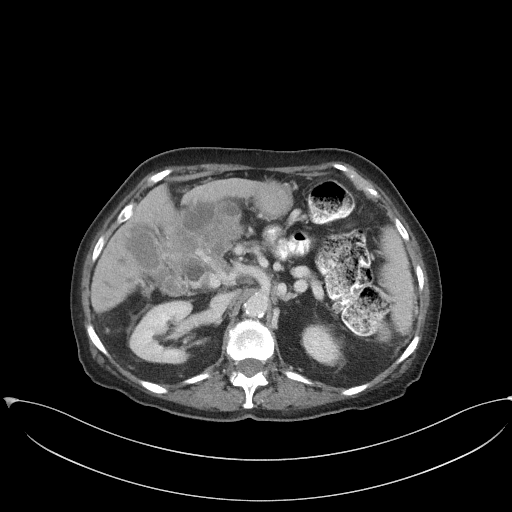
[im 78/98  soft-tissue]
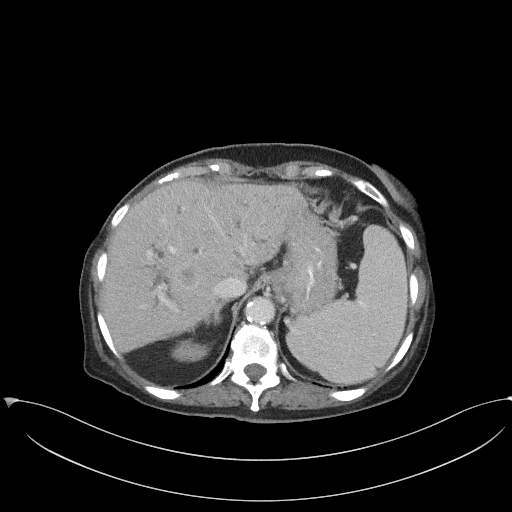
[im 88/98  soft-tissue]
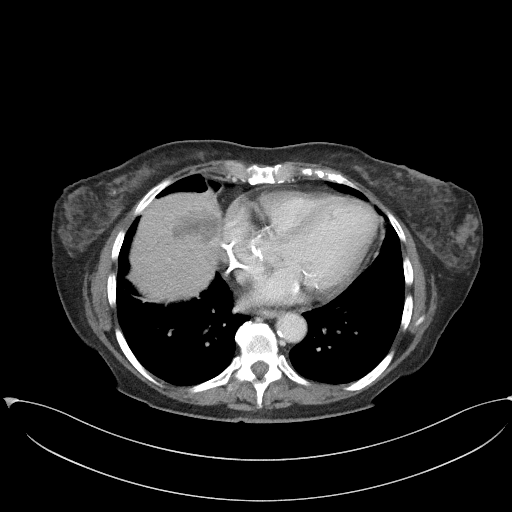
[im 88/98  bone]
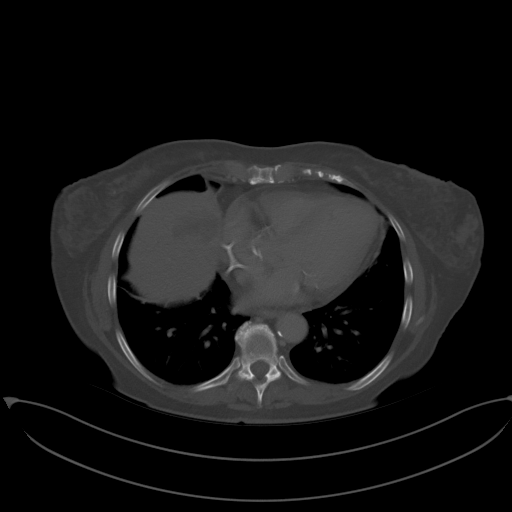

[Series 7: coronal st · coronal · 0.68mm/px · 3 of 84 slices shown]
[im 21/84  soft-tissue]
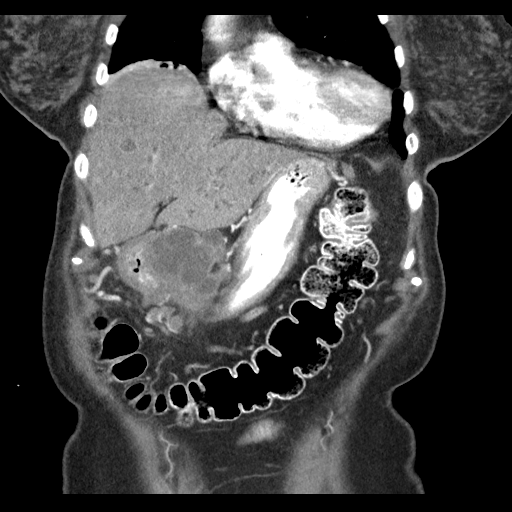
[im 42/84  soft-tissue]
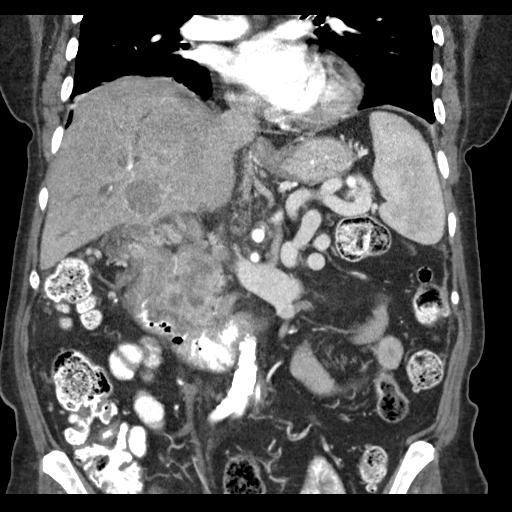
[im 63/84  soft-tissue]
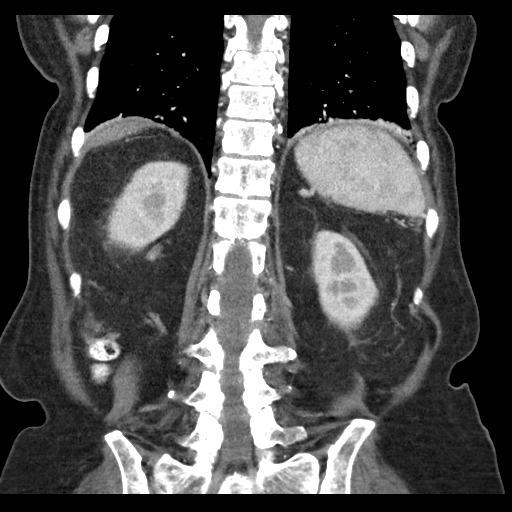

[Series 9: lung bases · axial · 0.84mm/px · z∈[-136,-96]mm · 3 of 59 slices shown]
[im 10/59  bone]
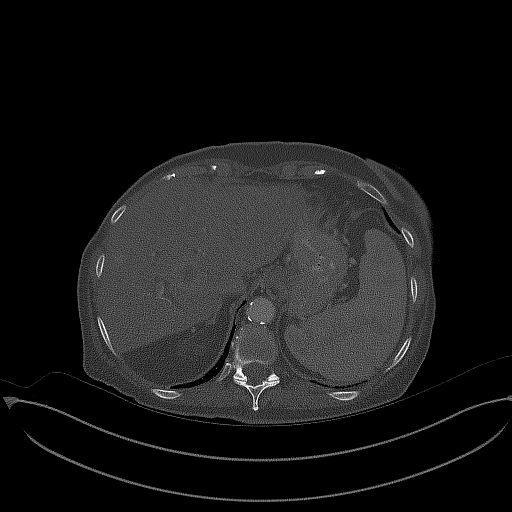
[im 20/59  bone]
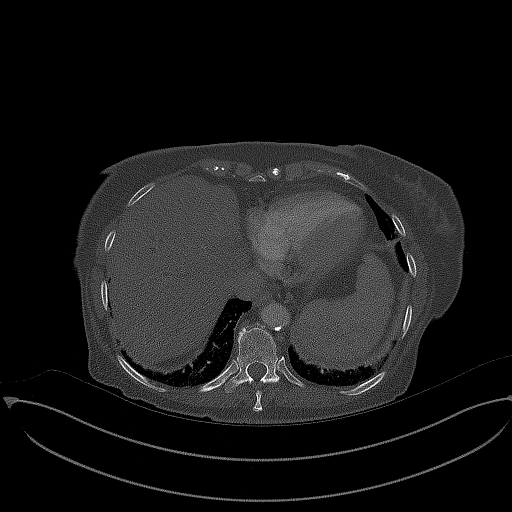
[im 30/59  bone]
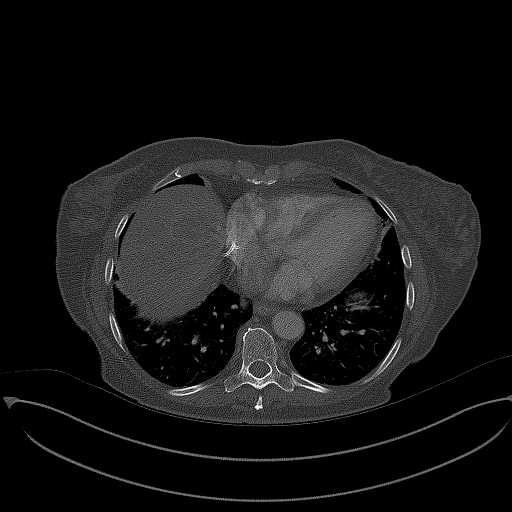

[15 of 46 positions shown; findings below may reference images not displayed]

FINDINGS: Lower chest: Basilar atelectasis bilaterally.

Hepatobiliary: 4.1 cm metastatic lesion in the dome of the liver has
increased from 2.8 cm previously. 3.4 cm lesion posterior right
liver ([DATE]) has increased from 2.3 cm previously. 9 mm metastatic
lesion in the inferior right liver adjacent to the gallbladder fossa
([DATE]) is stable. 11 mm lesion posterior right liver on [DATE] was 9
mm previously. Interval development of moderate intrahepatic biliary
duct dilatation. Extrahepatic common duct measures up to 17 mm
diameter with abrupt termination in the head of pancreas.
Gallbladder unremarkable.

Pancreas: Marked interval progression of the heterogeneous
low-density pancreatic head mass, now measuring 8.1 x 6.6 cm (35/3)
compared to 5.2 x 4.3 cm previously. As noted above, this obstructs
the common bile duct in the head of the pancreas. Mass lesion
generates substantial mass-effect on the gastric antrum and
duodenum. Pancreatic body and talar atrophic with some dilatation of
the main duct.

Spleen: Mild splenomegaly.

Adrenals/Urinary Tract: No adrenal nodule or mass. No enhancing mass
lesion noted in either kidney. No substantial hydroureteronephrosis.
The urinary bladder appears normal for the degree of distention.

Stomach/Bowel: Decompressed. Marked mass-effect on the distal
stomach due to the enlarging pancreatic head mass. Duodenum is
nondilated. No small bowel wall thickening. No small bowel
dilatation. The terminal ileum is normal. The appendix is normal. No
gross colonic mass. No colonic wall thickening. Diverticular changes
are noted in the left colon without evidence of diverticulitis.

Vascular/Lymphatic: There is abdominal aortic atherosclerosis
without aneurysm. As before, portal splenic confluence is
obliterated and splenic vein is occluded centrally. Portal vein
reconstitutes with extensive collateralization in the left abdomen
and markedly prominent left renal vein. No para-aortic
lymphadenopathy. No pelvic sidewall lymphadenopathy.

Reproductive: The uterus is surgically absent. There is no adnexal
mass.

Other: Interval development of multiple soft tissue nodules in the
perigastric fat around the antrum with apparent extension into the
omentum. 13 mm perigastric nodule visible on 43/5. Omental
nodularity visible on 38/5. No intraperitoneal free fluid.

Musculoskeletal: No worrisome lytic or sclerotic osseous
abnormality.
IMPRESSION: 1. Marked interval progression of the pancreatic head mass in the 4
week interval since prior study. Lesion measures up to 8.1 cm
diameter today compared to 5.2 cm previously. Lesion generates
substantial mass-effect on the posterior wall gastric antrum.
2. New metastatic nodularity in the perigastric fat, left omentum,
and transverse mesocolon.
3. Interval development of moderate to severe intra and extrahepatic
biliary duct dilatation with obliteration of the common bile duct in
the head of the pancreas by the tumor. Correlation with liver
function test recommended.
4. Interval progression of known liver metastases.
5. Occlusion of the portal splenic confluence with extensive left
abdominal venous collateralization and associated splenomegaly.
6.  Aortic Atherosclerois ([id number])

These results will be called to the ordering clinician or
representative by the Radiologist Assistant, and communication
documented in the PACS or zVision Dashboard.

## 2022-12-11 NOTE — Telephone Encounter (Signed)
Error

## 2023-01-18 NOTE — Telephone Encounter (Signed)
Telephone call
